# Patient Record
Sex: Male | Born: 1950
Health system: Southern US, Community
[De-identification: ages and names within clinical notes are randomized; demographics above are authoritative.]

## PROBLEM LIST (undated history)

## (undated) DIAGNOSIS — T7840XA Allergy, unspecified, initial encounter: Secondary | ICD-10-CM

## (undated) DIAGNOSIS — G4730 Sleep apnea, unspecified: Secondary | ICD-10-CM

## (undated) DIAGNOSIS — F17201 Nicotine dependence, unspecified, in remission: Secondary | ICD-10-CM

## (undated) DIAGNOSIS — I2510 Atherosclerotic heart disease of native coronary artery without angina pectoris: Secondary | ICD-10-CM

## (undated) DIAGNOSIS — Z973 Presence of spectacles and contact lenses: Secondary | ICD-10-CM

## (undated) DIAGNOSIS — R21 Rash and other nonspecific skin eruption: Secondary | ICD-10-CM

## (undated) DIAGNOSIS — E119 Type 2 diabetes mellitus without complications: Secondary | ICD-10-CM

## (undated) DIAGNOSIS — C61 Malignant neoplasm of prostate: Secondary | ICD-10-CM

## (undated) DIAGNOSIS — J189 Pneumonia, unspecified organism: Secondary | ICD-10-CM

## (undated) DIAGNOSIS — Z794 Long term (current) use of insulin: Secondary | ICD-10-CM

## (undated) DIAGNOSIS — E785 Hyperlipidemia, unspecified: Secondary | ICD-10-CM

## (undated) DIAGNOSIS — M1990 Unspecified osteoarthritis, unspecified site: Secondary | ICD-10-CM

## (undated) DIAGNOSIS — K219 Gastro-esophageal reflux disease without esophagitis: Secondary | ICD-10-CM

## (undated) DIAGNOSIS — I10 Essential (primary) hypertension: Secondary | ICD-10-CM

## (undated) HISTORY — PX: OTHER SURGICAL HISTORY: SHX169

## (undated) HISTORY — DX: Nicotine dependence, unspecified, in remission: F17.201

## (undated) HISTORY — DX: Type 2 diabetes mellitus without complications: E11.9

## (undated) HISTORY — DX: Essential (primary) hypertension: I10

## (undated) HISTORY — DX: Atherosclerotic heart disease of native coronary artery without angina pectoris: I25.10

## (undated) HISTORY — PX: HERNIA REPAIR: SHX51

## (undated) HISTORY — DX: Allergy, unspecified, initial encounter: T78.40XA

## (undated) HISTORY — DX: Long term (current) use of insulin: Z79.4

## (undated) HISTORY — PX: CARDIAC CATHETERIZATION: SHX172

## (undated) HISTORY — DX: Hyperlipidemia, unspecified: E78.5

---

## 1994-04-03 HISTORY — PX: EPIDIDYMIS SURGERY: SHX843

## 2001-04-03 HISTORY — PX: CARDIAC CATHETERIZATION: SHX172

## 2004-03-10 ENCOUNTER — Ambulatory Visit: Payer: Self-pay | Admitting: Family Medicine

## 2004-03-15 ENCOUNTER — Ambulatory Visit: Payer: Self-pay | Admitting: Cardiology

## 2004-03-31 ENCOUNTER — Ambulatory Visit: Payer: Self-pay | Admitting: Family Medicine

## 2004-04-03 HISTORY — PX: PRESSURE ULCER DEBRIDEMENT: SHX750

## 2004-04-18 ENCOUNTER — Ambulatory Visit (HOSPITAL_COMMUNITY): Admission: RE | Admit: 2004-04-18 | Discharge: 2004-04-18 | Payer: Self-pay | Admitting: Podiatry

## 2004-04-27 ENCOUNTER — Ambulatory Visit: Payer: Self-pay | Admitting: Orthopedic Surgery

## 2004-05-03 ENCOUNTER — Encounter (HOSPITAL_COMMUNITY): Admission: RE | Admit: 2004-05-03 | Discharge: 2004-06-02 | Payer: Self-pay | Admitting: Orthopedic Surgery

## 2004-06-30 ENCOUNTER — Ambulatory Visit: Payer: Self-pay | Admitting: Family Medicine

## 2004-08-04 ENCOUNTER — Ambulatory Visit: Payer: Self-pay | Admitting: Family Medicine

## 2004-08-11 ENCOUNTER — Ambulatory Visit (HOSPITAL_COMMUNITY): Admission: RE | Admit: 2004-08-11 | Discharge: 2004-08-11 | Payer: Self-pay | Admitting: Family Medicine

## 2004-08-23 ENCOUNTER — Ambulatory Visit: Payer: Self-pay | Admitting: Internal Medicine

## 2004-08-23 ENCOUNTER — Ambulatory Visit (HOSPITAL_COMMUNITY): Admission: RE | Admit: 2004-08-23 | Discharge: 2004-08-23 | Payer: Self-pay | Admitting: Internal Medicine

## 2004-08-23 LAB — HM COLONOSCOPY

## 2004-11-18 ENCOUNTER — Ambulatory Visit: Payer: Self-pay | Admitting: Family Medicine

## 2005-02-21 ENCOUNTER — Ambulatory Visit: Payer: Self-pay | Admitting: Orthopedic Surgery

## 2005-03-09 ENCOUNTER — Ambulatory Visit: Payer: Self-pay | Admitting: Cardiology

## 2005-03-20 ENCOUNTER — Ambulatory Visit: Payer: Self-pay | Admitting: Family Medicine

## 2005-04-20 ENCOUNTER — Ambulatory Visit: Payer: Self-pay | Admitting: Family Medicine

## 2005-07-20 ENCOUNTER — Ambulatory Visit: Payer: Self-pay | Admitting: Family Medicine

## 2005-10-19 ENCOUNTER — Ambulatory Visit: Payer: Self-pay | Admitting: Family Medicine

## 2006-02-20 ENCOUNTER — Ambulatory Visit: Payer: Self-pay | Admitting: Family Medicine

## 2006-03-08 ENCOUNTER — Ambulatory Visit: Payer: Self-pay | Admitting: Cardiology

## 2006-04-05 ENCOUNTER — Ambulatory Visit: Payer: Self-pay | Admitting: Family Medicine

## 2006-05-17 ENCOUNTER — Ambulatory Visit: Payer: Self-pay | Admitting: Family Medicine

## 2006-05-17 LAB — CONVERTED CEMR LAB
CO2: 23 meq/L (ref 19–32)
Chloride: 109 meq/L (ref 96–112)
Eosinophils Absolute: 0.1 10*3/uL (ref 0.0–0.7)
Glucose, Bld: 105 mg/dL — ABNORMAL HIGH (ref 70–99)
Hgb A1c MFr Bld: 6.7 % — ABNORMAL HIGH (ref 4.6–6.1)
Lymphocytes Relative: 33 % (ref 12–46)
Lymphs Abs: 1.4 10*3/uL (ref 0.7–3.3)
Neutro Abs: 2.3 10*3/uL (ref 1.7–7.7)
Neutrophils Relative %: 54 % (ref 43–77)
Platelets: 227 10*3/uL (ref 150–400)
Potassium: 4.7 meq/L (ref 3.5–5.3)
RBC: 4.57 M/uL (ref 4.22–5.81)
Sodium: 143 meq/L (ref 135–145)
WBC: 4.2 10*3/uL (ref 4.0–10.5)

## 2006-05-18 ENCOUNTER — Ambulatory Visit (HOSPITAL_COMMUNITY): Admission: RE | Admit: 2006-05-18 | Discharge: 2006-05-18 | Payer: Self-pay | Admitting: Family Medicine

## 2006-05-18 ENCOUNTER — Encounter: Payer: Self-pay | Admitting: Family Medicine

## 2006-05-18 LAB — CONVERTED CEMR LAB
Leukocytes, UA: NEGATIVE
Protein, ur: NEGATIVE mg/dL
Specific Gravity, Urine: 1.023 (ref 1.005–1.03)
Urine Glucose: NEGATIVE mg/dL

## 2006-06-28 ENCOUNTER — Ambulatory Visit: Payer: Self-pay | Admitting: Family Medicine

## 2006-09-21 ENCOUNTER — Encounter: Payer: Self-pay | Admitting: Family Medicine

## 2006-09-21 LAB — CONVERTED CEMR LAB
ALT: 13 units/L (ref 0–53)
AST: 17 units/L (ref 0–37)
Bilirubin, Direct: 0.1 mg/dL (ref 0.0–0.3)
Cholesterol: 328 mg/dL — ABNORMAL HIGH (ref 0–200)
Indirect Bilirubin: 0.4 mg/dL (ref 0.0–0.9)
Microalb, Ur: 0.6 mg/dL (ref 0.00–1.89)
Total CHOL/HDL Ratio: 7.5
Triglycerides: 113 mg/dL (ref <150)

## 2006-09-24 ENCOUNTER — Ambulatory Visit: Payer: Self-pay | Admitting: Family Medicine

## 2006-12-21 ENCOUNTER — Encounter: Payer: Self-pay | Admitting: Family Medicine

## 2006-12-21 LAB — CONVERTED CEMR LAB
Bilirubin, Direct: 0.1 mg/dL (ref 0.0–0.3)
Total Bilirubin: 0.5 mg/dL (ref 0.3–1.2)
Total CHOL/HDL Ratio: 2.4
VLDL: 16 mg/dL (ref 0–40)

## 2006-12-25 ENCOUNTER — Ambulatory Visit: Payer: Self-pay | Admitting: Family Medicine

## 2007-01-01 ENCOUNTER — Ambulatory Visit: Payer: Self-pay | Admitting: Cardiology

## 2007-04-26 ENCOUNTER — Encounter: Payer: Self-pay | Admitting: Family Medicine

## 2007-04-26 LAB — CONVERTED CEMR LAB
ALT: 20 units/L (ref 0–53)
Bilirubin, Direct: 0.1 mg/dL (ref 0.0–0.3)
CO2: 23 meq/L (ref 19–32)
Glucose, Bld: 109 mg/dL — ABNORMAL HIGH (ref 70–99)
Potassium: 4.4 meq/L (ref 3.5–5.3)
Sodium: 142 meq/L (ref 135–145)
Total Bilirubin: 0.4 mg/dL (ref 0.3–1.2)
Total CHOL/HDL Ratio: 3.1
VLDL: 25 mg/dL (ref 0–40)

## 2007-04-30 ENCOUNTER — Ambulatory Visit: Payer: Self-pay | Admitting: Family Medicine

## 2007-05-27 ENCOUNTER — Encounter: Payer: Self-pay | Admitting: Family Medicine

## 2007-05-27 LAB — CONVERTED CEMR LAB
Eosinophils Absolute: 0.4 10*3/uL (ref 0.0–0.7)
HCT: 39.6 % (ref 39.0–52.0)
Lymphs Abs: 1.2 10*3/uL (ref 0.7–4.0)
MCV: 94.1 fL (ref 78.0–100.0)
Monocytes Relative: 10 % (ref 3–12)
Neutrophils Relative %: 49 % (ref 43–77)
PSA: 0.51 ng/mL (ref 0.10–4.00)
RBC: 4.21 M/uL — ABNORMAL LOW (ref 4.22–5.81)
WBC: 4.3 10*3/uL (ref 4.0–10.5)

## 2007-08-16 ENCOUNTER — Encounter: Payer: Self-pay | Admitting: Family Medicine

## 2007-08-16 LAB — CONVERTED CEMR LAB
Albumin: 4.3 g/dL (ref 3.5–5.2)
Alkaline Phosphatase: 83 units/L (ref 39–117)
CO2: 25 meq/L (ref 19–32)
Chloride: 106 meq/L (ref 96–112)
Creatinine, Ser: 1.26 mg/dL (ref 0.40–1.50)
HDL: 47 mg/dL (ref >39)
LDL Cholesterol: 76 mg/dL (ref 0–99)
Potassium: 4.5 meq/L (ref 3.5–5.3)
Total Bilirubin: 0.3 mg/dL (ref 0.3–1.2)
Total CHOL/HDL Ratio: 3.1
VLDL: 23 mg/dL (ref 0–40)

## 2007-08-20 ENCOUNTER — Ambulatory Visit: Payer: Self-pay | Admitting: Family Medicine

## 2007-08-20 ENCOUNTER — Encounter: Payer: Self-pay | Admitting: Family Medicine

## 2007-09-09 ENCOUNTER — Ambulatory Visit: Payer: Self-pay | Admitting: Cardiology

## 2007-09-16 ENCOUNTER — Encounter: Payer: Self-pay | Admitting: Cardiology

## 2007-09-16 ENCOUNTER — Ambulatory Visit (HOSPITAL_COMMUNITY): Admission: RE | Admit: 2007-09-16 | Discharge: 2007-09-16 | Payer: Self-pay | Admitting: Cardiology

## 2007-09-16 ENCOUNTER — Ambulatory Visit: Payer: Self-pay | Admitting: Cardiology

## 2007-10-30 ENCOUNTER — Telehealth: Payer: Self-pay | Admitting: Family Medicine

## 2007-12-26 ENCOUNTER — Encounter: Payer: Self-pay | Admitting: Family Medicine

## 2008-01-07 ENCOUNTER — Encounter: Payer: Self-pay | Admitting: Family Medicine

## 2008-01-20 ENCOUNTER — Encounter: Payer: Self-pay | Admitting: Family Medicine

## 2008-01-20 LAB — CONVERTED CEMR LAB
ALT: 14 units/L (ref 0–53)
AST: 18 units/L (ref 0–37)
Albumin: 4.5 g/dL (ref 3.5–5.2)
Alkaline Phosphatase: 96 units/L (ref 39–117)
Calcium: 9.1 mg/dL (ref 8.4–10.5)
Cholesterol: 110 mg/dL (ref 0–200)
Creatinine, Ser: 1.28 mg/dL (ref 0.40–1.50)
Creatinine, Urine: 289.9 mg/dL
HDL: 43 mg/dL (ref >39)
Microalb Creat Ratio: 6.8 mg/g (ref 0.0–30.0)
Microalb, Ur: 1.96 mg/dL — ABNORMAL HIGH (ref 0.00–1.89)
Sodium: 142 meq/L (ref 135–145)
Total CHOL/HDL Ratio: 2.6
Triglycerides: 99 mg/dL (ref <150)

## 2008-01-22 ENCOUNTER — Ambulatory Visit: Payer: Self-pay | Admitting: Family Medicine

## 2008-01-22 LAB — CONVERTED CEMR LAB: Blood Glucose, Fasting: 146 mg/dL

## 2008-04-03 HISTORY — PX: COLONOSCOPY: SHX174

## 2008-05-05 ENCOUNTER — Encounter: Payer: Self-pay | Admitting: Family Medicine

## 2008-05-05 LAB — CONVERTED CEMR LAB
Alkaline Phosphatase: 89 units/L (ref 39–117)
BUN: 15 mg/dL (ref 6–23)
Bilirubin, Direct: 0.1 mg/dL (ref 0.0–0.3)
Chloride: 104 meq/L (ref 96–112)
Cholesterol: 148 mg/dL (ref 0–200)
Creatinine, Ser: 1.38 mg/dL (ref 0.40–1.50)
Glucose, Bld: 110 mg/dL — ABNORMAL HIGH (ref 70–99)
Indirect Bilirubin: 0.3 mg/dL (ref 0.0–0.9)
LDL Cholesterol: 75 mg/dL (ref 0–99)
Total Protein: 7.2 g/dL (ref 6.0–8.3)
Triglycerides: 160 mg/dL — ABNORMAL HIGH (ref <150)
VLDL: 32 mg/dL (ref 0–40)

## 2008-05-07 ENCOUNTER — Ambulatory Visit: Payer: Self-pay | Admitting: Family Medicine

## 2008-05-07 DIAGNOSIS — M25569 Pain in unspecified knee: Secondary | ICD-10-CM | POA: Insufficient documentation

## 2008-05-07 LAB — CONVERTED CEMR LAB: Blood Glucose, Fasting: 117 mg/dL

## 2008-08-18 ENCOUNTER — Encounter: Payer: Self-pay | Admitting: Family Medicine

## 2008-08-18 LAB — CONVERTED CEMR LAB
ALT: 17 units/L (ref 0–53)
AST: 17 units/L (ref 0–37)
Albumin: 4 g/dL (ref 3.5–5.2)
BUN: 11 mg/dL (ref 6–23)
CO2: 23 meq/L (ref 19–32)
Chloride: 106 meq/L (ref 96–112)
Creatinine, Ser: 1.13 mg/dL (ref 0.40–1.50)
Eosinophils Absolute: 0.2 10*3/uL (ref 0.0–0.7)
Eosinophils Relative: 4 % (ref 0–5)
Glucose, Bld: 104 mg/dL — ABNORMAL HIGH (ref 70–99)
HCT: 38.1 % — ABNORMAL LOW (ref 39.0–52.0)
Hemoglobin: 12.4 g/dL — ABNORMAL LOW (ref 13.0–17.0)
Lymphocytes Relative: 34 % (ref 12–46)
Lymphs Abs: 1.8 10*3/uL (ref 0.7–4.0)
MCV: 90.5 fL (ref 78.0–100.0)
Monocytes Absolute: 0.5 10*3/uL (ref 0.1–1.0)
Monocytes Relative: 10 % (ref 3–12)
PSA: 0.79 ng/mL (ref 0.10–4.00)
Platelets: 201 10*3/uL (ref 150–400)
Potassium: 4.4 meq/L (ref 3.5–5.3)
Triglycerides: 168 mg/dL — ABNORMAL HIGH (ref <150)
VLDL: 34 mg/dL (ref 0–40)
WBC: 5.2 10*3/uL (ref 4.0–10.5)

## 2008-08-19 ENCOUNTER — Ambulatory Visit: Payer: Self-pay | Admitting: Family Medicine

## 2008-08-25 ENCOUNTER — Encounter: Payer: Self-pay | Admitting: Family Medicine

## 2008-09-23 ENCOUNTER — Encounter: Payer: Self-pay | Admitting: Cardiology

## 2008-09-23 ENCOUNTER — Ambulatory Visit: Payer: Self-pay | Admitting: Cardiology

## 2008-09-23 DIAGNOSIS — Z794 Long term (current) use of insulin: Secondary | ICD-10-CM

## 2008-09-23 DIAGNOSIS — E1165 Type 2 diabetes mellitus with hyperglycemia: Secondary | ICD-10-CM

## 2008-11-01 HISTORY — PX: KNEE ARTHROSCOPY W/ MENISCECTOMY: SHX1879

## 2008-11-09 ENCOUNTER — Encounter: Payer: Self-pay | Admitting: Family Medicine

## 2008-12-22 LAB — CONVERTED CEMR LAB: TSH: 1.58 microintl units/mL (ref 0.350–4.500)

## 2008-12-23 ENCOUNTER — Ambulatory Visit: Payer: Self-pay | Admitting: Family Medicine

## 2008-12-23 LAB — CONVERTED CEMR LAB: Hgb A1c MFr Bld: 7.3 %

## 2009-01-07 ENCOUNTER — Telehealth: Payer: Self-pay | Admitting: Family Medicine

## 2009-01-19 ENCOUNTER — Encounter: Payer: Self-pay | Admitting: Family Medicine

## 2009-01-29 ENCOUNTER — Encounter: Payer: Self-pay | Admitting: Family Medicine

## 2009-02-09 ENCOUNTER — Telehealth: Payer: Self-pay | Admitting: Family Medicine

## 2009-03-08 LAB — CONVERTED CEMR LAB
ALT: 14 units/L (ref 0–53)
AST: 11 units/L (ref 0–37)
Albumin: 4.5 g/dL (ref 3.5–5.2)
CO2: 24 meq/L (ref 19–32)
Calcium: 8.7 mg/dL (ref 8.4–10.5)
Chloride: 105 meq/L (ref 96–112)
HDL: 39 mg/dL — ABNORMAL LOW (ref >39)
Sodium: 140 meq/L (ref 135–145)
Total Bilirubin: 0.4 mg/dL (ref 0.3–1.2)
Total CHOL/HDL Ratio: 3.2
Total Protein: 7.1 g/dL (ref 6.0–8.3)
Triglycerides: 183 mg/dL — ABNORMAL HIGH (ref <150)

## 2009-03-09 ENCOUNTER — Ambulatory Visit: Payer: Self-pay | Admitting: Family Medicine

## 2009-03-09 LAB — CONVERTED CEMR LAB: Glucose, Bld: 123 mg/dL

## 2009-03-10 ENCOUNTER — Encounter: Payer: Self-pay | Admitting: Family Medicine

## 2009-03-10 LAB — CONVERTED CEMR LAB: Microalb Creat Ratio: 5.6 mg/g (ref 0.0–30.0)

## 2009-06-15 ENCOUNTER — Encounter (INDEPENDENT_AMBULATORY_CARE_PROVIDER_SITE_OTHER): Payer: Self-pay | Admitting: *Deleted

## 2009-06-15 ENCOUNTER — Ambulatory Visit: Payer: Self-pay | Admitting: Family Medicine

## 2009-06-15 DIAGNOSIS — R5383 Other fatigue: Secondary | ICD-10-CM

## 2009-06-15 DIAGNOSIS — R5381 Other malaise: Secondary | ICD-10-CM

## 2009-06-15 DIAGNOSIS — M25519 Pain in unspecified shoulder: Secondary | ICD-10-CM | POA: Insufficient documentation

## 2009-06-15 DIAGNOSIS — D649 Anemia, unspecified: Secondary | ICD-10-CM | POA: Insufficient documentation

## 2009-06-15 LAB — CONVERTED CEMR LAB
ALT: 15 units/L
AST: 16 units/L
Bilirubin, Direct: 0.1 mg/dL
Cholesterol: 116 mg/dL
Creatinine, Ser: 1.23 mg/dL
GFR calc non Af Amer: 9 mL/min
Hgb A1c MFr Bld: 7.5 %
LDL Cholesterol: 51 mg/dL
Sodium: 142 meq/L
Triglycerides: 144 mg/dL

## 2009-06-16 LAB — CONVERTED CEMR LAB
Alkaline Phosphatase: 99 units/L (ref 39–117)
BUN: 14 mg/dL (ref 6–23)
Bilirubin, Direct: 0.1 mg/dL (ref 0.0–0.3)
CO2: 24 meq/L (ref 19–32)
Chloride: 107 meq/L (ref 96–112)
Creatinine, Ser: 1.23 mg/dL (ref 0.40–1.50)
Glucose, Bld: 119 mg/dL — ABNORMAL HIGH (ref 70–99)
Indirect Bilirubin: 0.4 mg/dL (ref 0.0–0.9)
LDL Cholesterol: 51 mg/dL (ref 0–99)
Total Bilirubin: 0.5 mg/dL (ref 0.3–1.2)
VLDL: 29 mg/dL (ref 0–40)

## 2009-07-02 HISTORY — PX: ROTATOR CUFF REPAIR: SHX139

## 2009-07-09 ENCOUNTER — Encounter (INDEPENDENT_AMBULATORY_CARE_PROVIDER_SITE_OTHER): Payer: Self-pay | Admitting: *Deleted

## 2009-08-03 ENCOUNTER — Telehealth: Payer: Self-pay | Admitting: Family Medicine

## 2009-09-24 ENCOUNTER — Encounter (INDEPENDENT_AMBULATORY_CARE_PROVIDER_SITE_OTHER): Payer: Self-pay | Admitting: *Deleted

## 2009-09-27 ENCOUNTER — Ambulatory Visit: Payer: Self-pay | Admitting: Cardiology

## 2009-09-27 DIAGNOSIS — I2510 Atherosclerotic heart disease of native coronary artery without angina pectoris: Secondary | ICD-10-CM

## 2009-09-27 DIAGNOSIS — Z87891 Personal history of nicotine dependence: Secondary | ICD-10-CM

## 2009-10-11 ENCOUNTER — Encounter (INDEPENDENT_AMBULATORY_CARE_PROVIDER_SITE_OTHER): Payer: Self-pay | Admitting: *Deleted

## 2009-10-11 LAB — CONVERTED CEMR LAB
Basophils Absolute: 0 10*3/uL
Basophils Relative: 1 %
Eosinophils Absolute: 0.1 10*3/uL
HCT: 40 %
Hemoglobin: 12.6 g/dL
MCV: 88.3 fL
Monocytes Absolute: 0.5 10*3/uL
Monocytes Relative: 9 %
Platelets: 258 10*3/uL
RDW: 16.2 %
WBC: 5.2 10*3/uL

## 2009-10-12 ENCOUNTER — Ambulatory Visit: Payer: Self-pay | Admitting: Family Medicine

## 2009-10-12 ENCOUNTER — Telehealth: Payer: Self-pay | Admitting: Cardiology

## 2009-10-12 LAB — CONVERTED CEMR LAB: Retic Ct Pct: 1.6 % (ref 0.4–3.1)

## 2009-11-26 ENCOUNTER — Encounter (INDEPENDENT_AMBULATORY_CARE_PROVIDER_SITE_OTHER): Payer: Self-pay | Admitting: *Deleted

## 2009-11-26 ENCOUNTER — Telehealth (INDEPENDENT_AMBULATORY_CARE_PROVIDER_SITE_OTHER): Payer: Self-pay | Admitting: *Deleted

## 2009-11-29 ENCOUNTER — Telehealth: Payer: Self-pay | Admitting: Family Medicine

## 2009-12-03 ENCOUNTER — Ambulatory Visit: Payer: Self-pay | Admitting: Cardiology

## 2009-12-03 ENCOUNTER — Encounter (HOSPITAL_COMMUNITY)
Admission: RE | Admit: 2009-12-03 | Discharge: 2009-12-03 | Payer: Self-pay | Source: Home / Self Care | Admitting: Cardiology

## 2009-12-16 ENCOUNTER — Encounter (INDEPENDENT_AMBULATORY_CARE_PROVIDER_SITE_OTHER): Payer: Self-pay | Admitting: *Deleted

## 2009-12-20 ENCOUNTER — Ambulatory Visit: Payer: Self-pay | Admitting: Cardiology

## 2009-12-20 ENCOUNTER — Encounter (INDEPENDENT_AMBULATORY_CARE_PROVIDER_SITE_OTHER): Payer: Self-pay | Admitting: *Deleted

## 2009-12-24 ENCOUNTER — Encounter (INDEPENDENT_AMBULATORY_CARE_PROVIDER_SITE_OTHER): Payer: Self-pay | Admitting: *Deleted

## 2010-01-12 LAB — CONVERTED CEMR LAB
ALT: 12 units/L (ref 0–53)
AST: 15 units/L (ref 0–37)
Alkaline Phosphatase: 112 units/L (ref 39–117)
BUN: 16 mg/dL (ref 6–23)
Bilirubin, Direct: 0.1 mg/dL (ref 0.0–0.3)
Calcium: 8.8 mg/dL (ref 8.4–10.5)
Cholesterol: 110 mg/dL (ref 0–200)
Glucose, Bld: 87 mg/dL (ref 70–99)
Indirect Bilirubin: 0.2 mg/dL (ref 0.0–0.9)
Potassium: 4.1 meq/L (ref 3.5–5.3)
Total Protein: 7.3 g/dL (ref 6.0–8.3)
Triglycerides: 183 mg/dL — ABNORMAL HIGH (ref <150)

## 2010-01-13 ENCOUNTER — Ambulatory Visit: Payer: Self-pay | Admitting: Family Medicine

## 2010-01-13 LAB — HM DIABETES FOOT EXAM

## 2010-01-14 ENCOUNTER — Encounter: Payer: Self-pay | Admitting: Family Medicine

## 2010-01-21 ENCOUNTER — Encounter: Payer: Self-pay | Admitting: Family Medicine

## 2010-01-27 ENCOUNTER — Telehealth (INDEPENDENT_AMBULATORY_CARE_PROVIDER_SITE_OTHER): Payer: Self-pay | Admitting: *Deleted

## 2010-02-15 ENCOUNTER — Telehealth: Payer: Self-pay | Admitting: Family Medicine

## 2010-03-11 ENCOUNTER — Telehealth: Payer: Self-pay | Admitting: Family Medicine

## 2010-03-14 LAB — CONVERTED CEMR LAB
BUN: 15 mg/dL (ref 6–23)
CO2: 27 meq/L (ref 19–32)
Calcium: 9.2 mg/dL (ref 8.4–10.5)
Cholesterol: 151 mg/dL (ref 0–200)
Creatinine, Ser: 1.31 mg/dL (ref 0.40–1.50)
Glucose, Bld: 136 mg/dL — ABNORMAL HIGH (ref 70–99)
Sodium: 139 meq/L (ref 135–145)
Total CHOL/HDL Ratio: 3.7

## 2010-03-15 ENCOUNTER — Ambulatory Visit: Payer: Self-pay | Admitting: Family Medicine

## 2010-03-24 ENCOUNTER — Telehealth: Payer: Self-pay | Admitting: Family Medicine

## 2010-05-03 NOTE — Letter (Signed)
Summary: [facility] at [facility]  [address], [place] [address]   Phone: [phone]  Fax: [phone]     December 20, 2009 MRN: [id number]   Robert [name] [address] [place], [place]  [address]      YOUR LAB WORK IS DUE   March 21, 2010  Please go to [facility], located across the street from [facility] on the second floor.  Hours are Monday - Friday 7am until 7:30pm         Saturday 8am until 12noon    _X_  DO NOT EAT OR DRINK AFTER MIDNIGHT EVENING PRIOR TO LABWORK  __ YOUR LABWORK IS NOT FASTING --YOU MAY EAT PRIOR TO LABWORK

## 2010-05-03 NOTE — Assessment & Plan Note (Signed)
Summary: office visit   Vital Signs:  Patient profile:   60 year old male Height:      69 inches Weight:      220.75 pounds BMI:     32.72 O2 Sat:      96 % Pulse rate:   76 / minute Pulse rhythm:   regular Resp:     16 per minute BP sitting:   112 / 80  (left arm) Cuff size:   large  Vitals Entered By: Everitt Amber LPN (January 13, 2010 9:42 AM)  Nutrition Counseling: Patient's BMI is greater than 25 and therefore counseled on weight management options. CC: Follow up chronic problems   Primary Care Farhaan [facility]:  Dr. Syliva Overman  CC:  Follow up chronic problems.  History of Present Illness: Reports  that he has been doing fairly well. He unfortunatelyu stopped checking his sugars when he relised that they were higher than they should be, he did cut back sweetened drinks, but he has not stuck to a regular exercise routine.As a result his blood sugar control has deteriorated. I spent a long time discussing and explaining the nat h/o the ds and the impt of taking control and owbership of the disease.  Denies recent fever or chills. Denies sinus pressure, nasal congestion , ear pain or sore throat. Denies chest congestion, or cough productive of sputum. Denies chest pain, palpitations, PND, orthopnea or leg swelling. Denies abdominal pain, nausea, vomitting, diarrhea or constipation. Denies change in bowel movements or bloody stool. Denies dysuria , frequency, incontinence or hesitancy. Denies  joint pain, swelling, or reduced mobility. Denies headaches, vertigo, seizures. Denies depression, anxiety or insomnia. Denies  rash, lesions, or itch.     Allergies: 1)  ! * Crestor  Review of Systems      See HPI Eyes:  Denies discharge, eye pain, and red eye. Endo:  Denies excessive thirst and excessive urination; not testing . Heme:  Denies abnormal bruising and bleeding. Allergy:  Denies hives or rash and itching eyes.  Physical Exam  General:   Well-developed,well-nourished,in no acute distress; alert,appropriate and cooperative throughout examination HEENT: No facial asymmetry,  EOMI, No sinus tenderness, TM's Clear, oropharynx  pink and moist.   Chest: Clear to auscultation bilaterally.  CVS: S1, S2, No murmurs, No S3.   Abd: Soft, Nontender.  MS: Adequate ROM spine, hips, shoulders and knees.  Ext: No edema.   CNS: CN 2-12 intact, power tone and sensation normal throughout.   Skin: Intact, no visible lesions or rashes.  Psych: Good eye contact, normal affect.  Memory intact, not anxious or depressed appearing.    Diabetes Management Exam:    Foot Exam (with socks and/or shoes not present):       Sensory-Monofilament:          Left foot: diminished          Right foot: diminished       Inspection:          Left foot: normal          Right foot: normal       Nails:          Left foot: normal          Right foot: normal   Impression & Recommendations:  Problem # 1:  HYPERTENSION (ICD-401.9) Assessment Unchanged  His updated medication list for this problem includes:    Benicar 20 Mg Tabs (Olmesartan medoxomil) .Marland Kitchen... Take 1 tablet by mouth once a day  Toprol Xl 50 Mg Xr24h-tab (Metoprolol succinate) .Marland Kitchen... Take 1 tablet by mouth once a day  Orders: T-Basic Metabolic Panel [phone])  BP today: 112/80 Prior BP: 117/76 (12/20/2009)  Prior 10 Yr Risk Heart Disease: 9 % (12/03/2009)  Labs Reviewed: K+: 4.1 (01/11/2010) Creat: : 1.60 (01/11/2010)   Chol: 110 (01/11/2010)   HDL: 36 (01/11/2010)   LDL: 37 (01/11/2010)   TG: 183 (01/11/2010)  Problem # 2:  HYPERLIPIDEMIA (ICD-272.4) Assessment: Unchanged  His updated medication list for this problem includes:    Simvastatin 40 Mg Tabs (Simvastatin) .Marland Kitchen... Take one tablet by mouth daily at bedtime Low fat dietdiscussed and encouraged  Labs Reviewed: SGOT: 15 (01/11/2010)   SGPT: 12 (01/11/2010)  Prior 10 Yr Risk Heart Disease: 9 % (12/03/2009)   HDL:36  (01/11/2010), 36 (57/84/6962)  LDL:37 (01/11/2010), 51 (06/15/2009)  Chol:110 (01/11/2010), 116 (06/15/2009)  Trig:183 (01/11/2010), 144 (06/15/2009)  Problem # 3:  DIABETES MELLITUS, TYPE II, CONTROLLED, MILD (ICD-250.00) Assessment: Deteriorated  The following medications were removed from the medication list:    Glipizide 5 Mg Tabs (Glipizide) ..... One and a half in the morning and one in the evening His updated medication list for this problem includes:    Aspirin 81 Mg Tbec (Aspirin) .Marland Kitchen... Take 1 tablet by mouth once a day    Benicar 20 Mg Tabs (Olmesartan medoxomil) .Marland Kitchen... Take 1 tablet by mouth once a day    Onglyza 5 Mg Tabs (Saxagliptin hcl) .Marland Kitchen... Take 1 tablet by mouth once a day    Glipizide 5 Mg Tabs (Glipizide) .Marland Kitchen..Marland Kitchen Two tablets twice daily Patient advised to reduce carbs and sweets, commit to regular physical activity, take meds as prescribed, test blood sugars as directed, and attempt to lose weight , to improve blood sugar control.  Labs Reviewed: Creat: 1.60 (01/11/2010)    Reviewed HgBA1c results: 7.7 (01/11/2010)  7.4 (10/11/2009)  Complete Medication List: 1)  Aspirin 81 Mg Tbec (Aspirin) .... Take 1 tablet by mouth once a day 2)  Benicar 20 Mg Tabs (Olmesartan medoxomil) .... Take 1 tablet by mouth once a day 3)  Onglyza 5 Mg Tabs (Saxagliptin hcl) .... Take 1 tablet by mouth once a day 4)  Toprol Xl 50 Mg Xr24h-tab (Metoprolol succinate) .... Take 1 tablet by mouth once a day 5)  Simvastatin 40 Mg Tabs (Simvastatin) .... Take one tablet by mouth daily at bedtime 6)  Glipizide 5 Mg Tabs (Glipizide) .... Two tablets twice daily  Other Orders: Influenza Vaccine MCR (95284) Pneumococcal Vaccine (13244) Admin 1st Vaccine (01027) Admin 1st Vaccine [facility]) [phone])  Patient Instructions: 1)  Please schedule a follow-up appointment in 2 months. 2)  BMP prior to visit, ICD-9:  non fasting in 2 months 3)  PLS inc the glipizide to 5mg  TWO twice daily 4)  PLS check your  sugar at least twice daily and record, and vbring your meter and log to the next visit. 5)  You CASn take control and WIN the battle 6)  Today you will get the flu vac and pneumonia vaccines    Pneumovax    Vaccine Type: Pneumovax    Site: left deltoid    Mfr: Merck    Dose: 0.5 ml    Route: IM    Given by: Everitt Amber LPN    Exp. Date: 06/20/2011    Lot #: [id number]  Influenza Vaccine    Vaccine Type: Fluvax MCR    Site: right deltoid    Mfr: novartis    Dose: 0.25  ml    Route: IM    Given by: Everitt Amber LPN    Exp. Date: 08/2010    Lot #: [id number]

## 2010-05-03 NOTE — Miscellaneous (Signed)
Summary: LABS ANEMIA PROFILE,CBCD,A1C,PSA,10/11/2009  Clinical Lists Changes  Observations: Added new observation of FOLATE: 16.1 ng/mL (10/11/2009 11:44) Added new observation of B12: 235 pg/mL (10/11/2009 11:44) Added new observation of IRON SATUR %: 17 % (10/11/2009 11:44) Added new observation of TIBC: 350 mcg/dL (16/01/9603 54:09) Added new observation of UIBC: 292 mcg/dL (81/19/1478 29:56) Added new observation of IRON: 58 mcg/dL (21/30/8657 84:69) Added new observation of RETICULOCTAB: 1.6  (10/11/2009 11:44) Added new observation of RBC _: 4.53 10^6/microliter (10/11/2009 11:44) Added new observation of ABSOLUTE BAS: 0.0 K/uL (10/11/2009 11:44) Added new observation of BASOPHIL %: 1 % (10/11/2009 11:44) Added new observation of EOS ABSLT: 0.1 K/uL (10/11/2009 11:44) Added new observation of % EOS AUTO: 3 % (10/11/2009 11:44) Added new observation of ABSOLUTE MON: 0.5 K/uL (10/11/2009 11:44) Added new observation of MONOCYTE %: 9 % (10/11/2009 11:44) Added new observation of ABS LYMPHOCY: 1.7 K/uL (10/11/2009 11:44) Added new observation of LYMPHS %: 32 % (10/11/2009 11:44) Added new observation of PLATELETK/UL: 258 K/uL (10/11/2009 11:44) Added new observation of RDW: 16.2 % (10/11/2009 11:44) Added new observation of MCHC RBC: 31.5 g/dL (62/95/2841 32:44) Added new observation of MCV: 88.3 fL (10/11/2009 11:44) Added new observation of HCT: 40.0 % (10/11/2009 11:44) Added new observation of HGB: 12.6 g/dL (04/05/7251 66:44) Added new observation of RBC M/UL: 4.53 M/uL (10/11/2009 11:44) Added new observation of WBC COUNT: 5.2 10*3/microliter (10/11/2009 11:44)

## 2010-05-03 NOTE — Letter (Signed)
Summary: [facility] (Nuc Med Stress)   [facility]  [address] [place] [address]   Phone: [phone]  Fax: [phone]    Nuclear Medicine 1-Day Stress Test Information Sheet  Re:     Robert [name]   DOB:     08/14/1950 MRN:     [id number] Weight:  Appointment Date: Register at: Appointment Time: Referring MD:  _X__Exercise Stress  __Adenosine   __Dobutamine  __Lexiscan  __Persantine   __Thallium  Urgency: ____1 (next day)   ____2 (one week)    ____3 (PRN)  Patient will receive Follow Up call with results: Patient needs follow-up appointment:  Instructions regarding medication:  How to prepare for your stress test: 1. DO NOT eat or dring 6 hours prior to your arrival time. This includes no caffeine (coffee, tea, sodas, chocolate) if you were instructed to take your medications, drink water with it.(HOLD METORPOLOL AND GLIPIZIDE THE MORNING OF YOUR TEST) 2. DO NOT use any tobacco products for at leaset 8 hours prior to arrival. 3. DO NOT wear dresses or any clothing that may have metal clasps or buttons. 4. Wear short sleeve shirts, loose clothing, and comfortalbe walking shoes. 5. DO NOT use lotions, oils or powder on your chest before the test. 6. The test will take approximately 3-4 hours from the time you arrive until completion. 7. To register the day of the test, go to the Short Stay entrance at [facility]. 8. If you must cancel your test, call [phone] as soon as you are aware.  After you arrive for test:   When you arrive at [facility], you will go to Short Stay to be registered. They will then send you to Radiology to check in. The Nuclear Medicine Tech will get you and start an IV in your arm or hand. A small amount of a radioactive tracer will then be injected into your IV. This tracer will then have to circulate for 30-45 minutes. During this time you will wait in the waiting room and you will be able to drink  something without caffeine. A series of pictures will be taken of your heart follwoing this waiting period. After the 1st set of pictures you will go to the stress lab to get ready for your stress test. During the stress test, another small amount of a radioactive tracer will be injected through your IV. When the stress test is complete, there is a short rest period while your heart rate and blood pressure will be monitored. When this monitoring period is complete you will have another set of pictrues taken. (The same as the 1st set of pictures). These pictures are taken between 15 minutes and 1 hour after the stress test. The time depends on the type of stress test you had. Your doctor will inform you of your test results within 7 days after test.    The possibilities of certain changes are possible during the test. They include abnormal blood pressure and disorders of the heart. Side effects of persantine or adenosine can include flushing, chest pain, shortness of breath, stomach tightness, headache and light-headedness. These side effects usually do not last long and are self-resolving. Every effort will be made to keep you comfortable and to minimize complications by obtaining a medical history and by close observation during the test. Emergency equipment, medications, and trained personnel are available to deal with any unusual situation which may arise.  Please notify office at least 48 hours  in advance if you are unable to keep this appt.

## 2010-05-03 NOTE — Progress Notes (Signed)
Summary: stips  Phone Note Call from Patient   Summary of Call: patient needs his freestyle stips called into [facility] in [place] he said he called in earlier to get these sent in, he is at the drug store  now was just going to buy them w/o prescription a little higher then he thought.  if we need to call him please call him at , [phone] Initial call taken by: Curtis Sites,  February 15, 2010 3:44 PM  Follow-up for Phone Call        pls order strips for twice daily testing for the meter #60 [facility]  dx uncontrolled type 2 diabetes, let pt know when done also pls Follow-up by: Syliva Overman MD,  February 16, 2010 6:29 AM    New/Updated Medications: * FREESTYLE TEST  STRP (GLUCOSE BLOOD) Use as directed Prescriptions: FREESTYLE TEST  STRP (GLUCOSE BLOOD) Use as directed  #60 x 5   Entered by:   Mauricia Area CMA   Authorized by:   Syliva Overman MD   Signed by:   Mauricia Area CMA on 02/16/2010   Method used:   Printed then faxed to ...       [facility] Environmental education officer)       [address] [address]       [place], [place]  [address]       Ph: [phone]       Fax: [phone]   RxID:   [id number]

## 2010-05-03 NOTE — Progress Notes (Signed)
Summary: MEDICINE  Phone Note Call from Patient   Summary of Call:  SEND RX  TO [facility]  TOPROL    ONGOLYZA,   GLIPIZID THOUGHT IT HAD BEEN DONE LAST WEEK BUT HAVE NOT BEEN SENT IN YET Initial call taken by: Lind Guest,  November 29, 2009 11:14 AM    Prescriptions: GLIPIZIDE 5 MG TABS (GLIPIZIDE) one and a half in the morning and one in the evening  #225 x 0   Entered by:   Adella Hare LPN   Authorized by:   Syliva Overman MD   Signed by:   Adella Hare LPN on 93/79/0240   Method used:   Faxed to ...       [facility] Environmental education officer)       [address] [address]       [place], [place]  [address]       Ph: [phone]       Fax: [phone]   RxID:   [id number] TOPROL XL 50 MG XR24H-TAB (METOPROLOL SUCCINATE) Take 1 tablet by mouth once a day  #90 x 0   Entered by:   Adella Hare LPN   Authorized by:   Syliva Overman MD   Signed by:   Adella Hare LPN on 85/63/1497   Method used:   Faxed to ...       [facility] Environmental education officer)       [address] [address]       [place], [place]  [address]       Ph: [phone]       Fax: [phone]   RxID:   [id number] ONGLYZA 5 MG TABS (SAXAGLIPTIN HCL) Take 1 tablet by mouth once a day  #90 x 0   Entered by:   Adella Hare LPN   Authorized by:   Syliva Overman MD   Signed by:   Adella Hare LPN on 65/46/5035   Method used:   Faxed to ...       [facility] Environmental education officer)       [address] [address]       [place], [place]  [address]       Ph: [phone]       Fax: [phone]   RxID:   [id number]

## 2010-05-03 NOTE — Assessment & Plan Note (Signed)
Summary: physical   Vital Signs:  Patient profile:   60 year old male Height:      69 inches Weight:      223.75 pounds BMI:     33.16 O2 Sat:      96 % Pulse rate:   78 / minute Pulse rhythm:   regular Resp:     16 per minute BP sitting:   120 / 82  (right arm)  Vitals Entered By: Everitt Amber LPN (October 12, 2009 8:46 AM)  Nutrition Counseling: Patient's BMI is greater than 25 and therefore counseled on weight management options. CC: CPE   Primary Care Provider:  Dr. Syliva Overman  CC:  CPE.  History of Present Illness: Reports  that he has been doing fairly well. Since his last appt he has had right shoulder surgery aND IS DOING VERY  WELL IN THERAPY. hE IS STILL UNABLE TO GOLF , AND IS ANXIOUS TO RETURN TO THIS. Denies recent fever or chills. Denies sinus pressure, nasal congestion , ear pain or sore throat. Denies chest congestion, or cough productive of sputum.  Denies abdominal pain, nausea, vomitting, diarrhea or constipation. Denies change in bowel movements or bloody stool. Denies dysuria , frequency, incontinence or hesitancy. Denies  joint pain, swelling, or reduced mobility. Denies headaches, vertigo, seizures. Denies depression, anxiety or insomnia. Denies  rash, lesions, or itch.     Current Medications (verified): 1)  Vytorin 10-40 Mg  Tabs (Ezetimibe-Simvastatin) .... Take One Tablet By Mouth Every Mon. Wed. Fri. and Sun. 2)  Aspirin 81 Mg  Tbec (Aspirin) .... Take 1 Tablet By Mouth Once A Day 3)  Benicar 20 Mg  Tabs (Olmesartan Medoxomil) .... Take 1 Tablet By Mouth Once A Day 4)  Onglyza 5 Mg Tabs (Saxagliptin Hcl) .... Take 1 Tablet By Mouth Once A Day 5)  Glipizide 5 Mg Tabs (Glipizide) .... Take 1 Tablet By Mouth Two Times A Day 6)  Toprol Xl 50 Mg Xr24h-Tab (Metoprolol Succinate) .... Take 1 Tablet By Mouth Once A Day  Allergies (verified): 1)  ! * Crestor  Past History:  Past Surgical History: CAD S/P PTCA placement 2003 Rt foot surgery  ulcer 2006 Epididymitis S/P removal 1996 Mid RCA unsuccesful attempt at stenting but a lot of colateral flow 2003  Right knee surgery 11/2008  murphy right shoulder surgery July 16, 2009  Review of Systems      See HPI General:  Complains of fatigue and malaise; denies chills and fever; exertinal fatigue x 1 yr worsening. Eyes:  Complains of vision loss-both eyes; denies discharge, eye pain, and red eye; last test 2 years ago, pt to [facility] own appt, ins not covering. CV:  Complains of fatigue; increasing fatigue in the past 1 year, worse with activity, no stress test since 2005 reportedly, 3 stenets placedin 2 vessels in 2003, after 30 mins he has to rest 30 mins. MS:  Complains of joint pain and stiffness; improved shoulder pain and mobility following surgery in 07/2009. Endo:  Denies excessive thirst and excessive urination; not testing regularly, fasyings are oveer 130, understands the need to test daily. Heme:  Denies abnormal bruising and bleeding. Allergy:  Denies hives or rash and itching eyes.  Physical Exam  General:  Well-developed,well-nourished,in no acute distress; alert,appropriate and cooperative throughout examination Head:  Normocephalic and atraumatic without obvious abnormalities. No apparent alopecia or balding. Eyes:  No corneal or conjunctival inflammation noted. EOMI. Perrla. Funduscopic exam benign, without hemorrhages, exudates or papilledema. Vision grossly  normal. Ears:  External ear exam shows no significant lesions or deformities.  Otoscopic examination reveals clear canals, tympanic membranes are intact bilaterally without bulging, retraction, inflammation or discharge. Hearing is grossly normal bilaterally. Nose:  External nasal examination shows no deformity or inflammation. Nasal mucosa are pink and moist without lesions or exudates. Mouth:  Oral mucosa and oropharynx without lesions or exudates.  Teeth in good repair. Neck:  No deformities, masses, or  tenderness noted. Chest Wall:  No deformities, masses, tenderness or gynecomastia noted. Breasts:  No masses or gynecomastia noted Lungs:  Normal respiratory effort, chest expands symmetrically. Lungs are clear to auscultation, no crackles or wheezes. Heart:  Normal rate and regular rhythm. S1 and S2 normal without gallop, murmur, click, rub or other extra sounds. Abdomen:  Bowel sounds positive,abdomen soft and non-tender without masses, organomegaly or hernias noted. Rectal:  No external abnormalities noted. Normal sphincter tone.hemmorhoids, internal. Guaic negative stool Genitalia:  Testes bilaterally descended without nodularity, tenderness or masses. No scrotal masses or lesions. No penis lesions or urethral discharge. Prostate:  Prostate gland firm and smooth, no enlargement, nodularity, tenderness, mass, asymmetry or induration. Msk:  No deformity or scoliosis noted of thoracic or lumbar spine.   Pulses:  R and L carotid,radial,femoral,dorsalis pedis and posterior tibial pulses are full and equal bilaterally Extremities:  No clubbing, cyanosis, edema, or deformity noted with normal full range of motion of all joints.   Neurologic:  No cranial nerve deficits noted. Station and gait are normal. Plantar reflexes are down-going bilaterally. DTRs are symmetrical throughout. Sensory, motor and coordinative functions appear intact. Skin:  Intact without suspicious lesions or rashes Cervical Nodes:  No lymphadenopathy noted Axillary Nodes:  No palpable lymphadenopathy Inguinal Nodes:  No significant adenopathy Psych:  Cognition and judgment appear intact. Alert and cooperative with normal attention span and concentration. No apparent delusions, illusions, hallucinations  Diabetes Management Exam:    Foot Exam (with socks and/or shoes not present):       Sensory-Monofilament:          Left foot: normal          Right foot: normal       Inspection:          Left foot: abnormal              Comments: callous          Right foot: abnormal             Comments: callous       Nails:          Left foot: normal          Right foot: normal   Impression & Recommendations:  Problem # 1:  HYPERTENSION (ICD-401.9) Assessment Improved  His updated medication list for this problem includes:    Benicar 20 Mg Tabs (Olmesartan medoxomil) .Marland Kitchen... Take 1 tablet by mouth once a day    Toprol Xl 50 Mg Xr24h-tab (Metoprolol succinate) .Marland Kitchen... Take 1 tablet by mouth once a day  Orders: T-Basic Metabolic Panel ([id number])  BP today: 120/82 Prior BP: 149/92 (09/27/2009)  Labs Reviewed: K+: 4.8 (06/15/2009) Creat: : 1.23 (06/15/2009)   Chol: 116 (06/15/2009)   HDL: 36 (06/15/2009)   LDL: 51 (06/15/2009)   TG: 144 (06/15/2009)  Problem # 2:  HYPERLIPIDEMIA (ICD-272.4) Assessment: Comment Only  His updated medication list for this problem includes:    Vytorin 10-40 Mg Tabs (Ezetimibe-simvastatin) .Marland Kitchen... Take one tablet by mouth every mon. wed. fri. and sun.  Orders: T-Hepatic Function [phone]) T-Lipid Profile [phone]) Cardiology Referral (Cardiology)  Labs Reviewed: SGOT: 16 (06/15/2009)   SGPT: 15 (06/15/2009)   HDL:36 (06/15/2009), 36 (06/15/2009)  LDL:51 (06/15/2009), 51 (06/15/2009)  Chol:116 (06/15/2009), 116 (06/15/2009)  Trig:144 (06/15/2009), 144 (06/15/2009)  Problem # 3:  OBESITY, UNSPECIFIED (ICD-278.00) Assessment: Unchanged  Ht: 69 (10/12/2009)   Wt: 223.75 (10/12/2009)   BMI: 33.16 (10/12/2009)  Problem # 4:  DIABETES MELLITUS, TYPE II, CONTROLLED, MILD (ICD-250.00) Assessment: Unchanged  The following medications were removed from the medication list:    Glipizide 5 Mg Tabs (Glipizide) .Marland Kitchen... Take 1 tablet by mouth two times a day His updated medication list for this problem includes:    Aspirin 81 Mg Tbec (Aspirin) .Marland Kitchen... Take 1 tablet by mouth once a day    Benicar 20 Mg Tabs (Olmesartan medoxomil) .Marland Kitchen... Take 1 tablet by mouth once a day    Onglyza 5  Mg Tabs (Saxagliptin hcl) .Marland Kitchen... Take 1 tablet by mouth once a day    Glipizide 5 Mg Tabs (Glipizide) ..... One and a half in the morning and one in the evening  Orders: T- Hemoglobin A1C ([id number])  Labs Reviewed: Creat: 1.23 (06/15/2009)    Reviewed HgBA1c results: 7.4 (10/11/2009)  7.5 (06/15/2009)  Problem # 5:  FATIGUE (ICD-780.79) Assessment: Deteriorated  Orders: Cardiology Referral (Cardiology)  Complete Medication List: 1)  Vytorin 10-40 Mg Tabs (Ezetimibe-simvastatin) .... Take one tablet by mouth every mon. wed. fri. and sun. 2)  Aspirin 81 Mg Tbec (Aspirin) .... Take 1 tablet by mouth once a day 3)  Benicar 20 Mg Tabs (Olmesartan medoxomil) .... Take 1 tablet by mouth once a day 4)  Onglyza 5 Mg Tabs (Saxagliptin hcl) .... Take 1 tablet by mouth once a day 5)  Toprol Xl 50 Mg Xr24h-tab (Metoprolol succinate) .... Take 1 tablet by mouth once a day 6)  Glipizide 5 Mg Tabs (Glipizide) .... One and a half in the morning and one in the evening  Patient Instructions: 1)  Please schedule a follow-up appointment in 3 months. 2)  You need to lose weight. Consider a lower calorie diet and regular exercise.  3)  Check your blood sugars regularly. If your readings are usually above250: or below 70 you should contact our office. 4)  Fasting sugars need to be between 80 to 120. 5)  I will send a msg to your cardiologist regarding fatigue. 6)  BMP prior to visit, ICD-9: 7)  Hepatic Panel prior to visit, ICD-9: 8)  Lipid Panel prior to visit, ICD-9:  fasting in 3 months 9)  HbgA1C prior to visit, ICD-9: 10)  Dose inc in glipizide 11)  Referral to cardiology Prescriptions: GLIPIZIDE 5 MG TABS (GLIPIZIDE) one and a half in the morning and one in the evening  #225 x 3   Entered and Authorized by:   Syliva Overman MD   Signed by:   Syliva Overman MD on 10/12/2009   Method used:   Printed then faxed to ...       [facility] education [facility])       [address] [address]       [place],  [place]  [address]       Ph: [phone]       Fax: [phone]   RxID:   [id number]

## 2010-05-03 NOTE — Progress Notes (Signed)
Summary: MEDICATION  Phone Note Call from Patient   Summary of Call: PATIENT STATES THAT HE'S OUT OF RX ON ONGLYUZA 5 MG,TOPROL XL 50 MG, NURSE ADVISE PT THAT WILL SEND TO A LOCAL PHARMACY THE PATIENT WANTS [facility]. PATIENT STATES THAT HE STILL HAVE GLIPIZIDE 5 MG BUT WANTED TO GO HEAD AND REFILL IT TOO. USUALLY THE PATIENT GET RX FROM [facility] Initial call taken by: Eugenio Hoes,  March 11, 2010 9:23 AM    Prescriptions: TOPROL XL 50 MG XR24H-TAB (METOPROLOL SUCCINATE) Take 1 tablet by mouth once a day  #30 x 0   Entered by:   Adella Hare LPN   Authorized by:   Syliva Overman MD   Signed by:   Adella Hare LPN on 04/54/0981   Method used:   [facility] to        [facility] [phone]* (retail)       [address]       [place], [place]  [address]       Ph: [phone] or [phone]       Fax: [phone]   RxID:   [id number] ONGLYZA 5 MG TABS (SAXAGLIPTIN HCL) Take 1 tablet by mouth once a day  #30 x 0   Entered by:   Adella Hare LPN   Authorized by:   Syliva Overman MD   Signed by:   Adella Hare LPN on 63/87/5643   Method used:   [facility] to        [facility] [phone]* (retail)       [address]       [place], [place]  [address]       Ph: [phone] or [phone]       Fax: [phone]   RxID:   [id number]

## 2010-05-03 NOTE — Letter (Signed)
Summary: [place] Results Engineer, agricultural at [facility]  [address] [place] [address]   Phone: [phone]  Fax: [phone]      December 24, 2009 MRN: [id number]   Robert [name] [address] [place], [place]  [address]   Dear [name],  Your test ordered by Selena Batten has been reviewed by your physician (or physician assistant) and was found to be normal or stable. Your physician (or physician assistant) felt no changes were needed at this time.  ____ Echocardiogram  _x___ Cardiac Stress Test  _x___ Lab Work  ____ Peripheral vascular study of arms, legs or neck  ____ CT scan or X-ray  ____ Lung or Breathing test  ____ Other:  No change in medical treatment at this time, per Dr. Dietrich Pates.  Enlcosed is a copy of your labwork for your records.  Thank you, Tammy Allyne Gee RN    [place]ai Bing, MD, [facility]ise Arena.C.Gaylord Shih, MD, F.A.C.C Lewayne Bunting, MD, F.A.C.C Nona Dell, MD, F.A.C.C Charlton Haws, MD, Lenise Arena.C.C

## 2010-05-03 NOTE — Letter (Signed)
Summary: med review sheet  med review sheet   Imported By: Rudene Anda 01/14/2010 16:39:29  _____________________________________________________________________  External Attachment:    Type:   Image     Comment:   External Document

## 2010-05-03 NOTE — Miscellaneous (Signed)
Summary: labs bmp,lipids,liver,a1c,06/15/2009  Clinical Lists Changes  Observations: Added new observation of ALBUMIN: 4.2 g/dL (51/88/4166 0:63) Added new observation of PROTEIN, TOT: 7.1 g/dL (01/60/1093 2:35) Added new observation of SGPT (ALT): 15 units/L (06/15/2009 9:36) Added new observation of SGOT (AST): 16 units/L (06/15/2009 9:36) Added new observation of ALK PHOS: 99 units/L (06/15/2009 9:36) Added new observation of BILI DIRECT: 0.1 mg/dL (57/32/2025 4:27) Added new observation of GFR: 9.0 mL/min (06/15/2009 9:36) Added new observation of CREATININE: 1.23 mg/dL (09/24/7626 3:15) Added new observation of BUN: 14 mg/dL (17/61/6073 7:10) Added new observation of BG RANDOM: 119 mg/dL (62/69/4854 6:27) Added new observation of CO2 PLSM/SER: 24 meq/L (06/15/2009 9:36) Added new observation of CL SERUM: 107 meq/L (06/15/2009 9:36) Added new observation of K SERUM: 4.8 meq/L (06/15/2009 9:36) Added new observation of NA: 142 meq/L (06/15/2009 9:36) Added new observation of LDL: 51 mg/dL (03/50/0938 1:82) Added new observation of HDL: 36 mg/dL (99/37/1696 7:89) Added new observation of TRIGLYC TOT: 144 mg/dL (38/01/1750 0:25) Added new observation of CHOLESTEROL: 116 mg/dL (85/27/7824 2:35) Added new observation of HGBA1C: 7.5 % (06/15/2009 9:36)

## 2010-05-03 NOTE — Progress Notes (Signed)
Summary: MEDICINE  Phone Note Call from Patient   Summary of Call: NEEDS A RX FILLED ONGLYZA 5MG    [facility] 90 DAY SUPPLY  CALL BACK AT [phone] Initial call taken by: Lind Guest,  Aug 03, 2009 9:39 AM  Follow-up for Phone Call        Rx Called In Follow-up by: Adella Hare LPN,  Aug 03, 1189 9:54 AM    Prescriptions: ONGLYZA 5 MG TABS (SAXAGLIPTIN HCL) Take 1 tablet by mouth once a day  #90 x 0   Entered by:   Adella Hare LPN   Authorized by:   Syliva Overman MD   Signed by:   Adella Hare LPN on 47/82/9562   Method used:   Faxed to ...       [facility] Environmental education officer)       [address] [address]       [place], [place]  [address]       Ph: [phone]       Fax: [phone]   RxID:   [id number]

## 2010-05-03 NOTE — Letter (Signed)
Summary: Clearance Letter  [facility]  [address], [place] [address]   Phone: [phone]  Fax: [phone]    July 09, 2009  Re:     Robert [name] Address:   [address]     [place], [place]  [address] DOB:     1951-03-22 MRN:     [id number]   Dear Delbert Harness Orthopedic Specialist:      [name] is doing extremely well with respect to coronary disease and management of cardiovascular risk factors.  He has no symptoms to suggest recurrent myocardial ischemia.  His previous testing indicates no significant infarction despite total obstruction of his coronary artery.  He is not at substantial risk, either for discontinuation of plavix or for the supposed right shoulder surgery.  No special operative precautions are necessary.  He should continue Toprol throughout the perioperative interval.           Sincerely,  Dr. Stephannie Li, [facility]

## 2010-05-03 NOTE — Assessment & Plan Note (Signed)
Summary: ROV POST STRESS TEST/TMJ   Visit Type:  Follow-up Referring Provider:  . Primary Provider:  Dr. Syliva Overman   History of Present Illness: [name] returns to the office at the request of Dr. Lodema Hong for assessment of exercise intolerance.  He notes fatigue with excessive exercise and thereafter.  He retired from work as a Academic librarian some months ago and finds that he is much less active.  He denies depression.  He reports no dyspnea, chest discomfort, orthopnea or PND.   Current Medications (verified): 1)  Aspirin 81 Mg  Tbec (Aspirin) .... Take 1 Tablet By Mouth Once A Day 2)  Benicar 20 Mg  Tabs (Olmesartan Medoxomil) .... Take 1 Tablet By Mouth Once A Day 3)  Onglyza 5 Mg Tabs (Saxagliptin Hcl) .... Take 1 Tablet By Mouth Once A Day 4)  Toprol Xl 50 Mg Xr24h-Tab (Metoprolol Succinate) .... Take 1 Tablet By Mouth Once A Day 5)  Glipizide 5 Mg Tabs (Glipizide) .... One and A Half in The Morning and One in The Evening 6)  Simvastatin 40 Mg Tabs (Simvastatin) .... Take One Tablet By Mouth Daily At Bedtime  Allergies (verified): 1)  ! * Crestor  Past History:  PMH, FH, and Social History reviewed and updated.  Past Surgical History: Rt foot surgery for ulceration- 2006 Surgical intervention for epididymitis- 1996 Right knee surgery- 11/2008  Right shoulder surgery July 16, 2009 Colonoscopy-2010; no pathologic findings   Family History: MOTHER  alive-no significant chronic medical conditions   FATHER: Deceased; hypernephroma Siblings: One sister with diabetes; 2 brothers, one with arthritis  Social History: Married; 2 children Tobacco-30 pack years; quit in 1985 Drug use-no Ethanol- discontinued use in 2005 Retired  Review of Systems       See history of present illness.  Vital Signs:  Patient profile:   60 year old male Weight:      226 pounds BMI:     33.50 O2 Sat:      95 % on Room air Pulse rate:   73 / minute BP sitting:   117 / 76   (right arm)  Vitals Entered By: Dreama Saa, CNA (December 20, 2009 3:09 PM)  O2 Flow:  Room air  Physical Exam  General:  Somewhat overweight; well developed; no acute distress:   Neck-No JVD; no carotid bruits: Lungs-No tachypnea, no rales; no rhonchi; no wheezes: Cardiovascular-normal PMI; normal S1 and S2; soft systolic ejection murmur at the cardiac base Abdomen-BS normal; soft and non-tender without masses or organomegaly:  Musculoskeletal-No deformities, no cyanosis or clubbing: Neurologic-Normal cranial nerves; symmetric strength and tone:  Skin-Warm, no significant lesions: Extremities-distal pulses intact; no edema:     Impression & Recommendations:  Problem # 1:  ATHEROSCLEROTIC CARDIOVASCULAR DISEASE (ICD-429.2) A stress nuclear study revealed good exercise tolerance with no electrocardiographic evidence for ischemia.  Myoview images were of suboptimal quality.  There was a defect inferiorly, consistent with the patient's known total occlusion of the RCA.  Partial reversibility suggested ischemia, but may have been artifact.  In any case, this was a  low risk scan, which also demonstrated normal left ventricular systolic function.  He does not require additional studies other than a TSH level.  A recent complete metabolic profile and CBC were normal.  Hemoglobin A1c level was somewhat elevated at 7.4 suggesting that intensification of his therapy for diabetes might be worthwhile.  He is encouraged to be more active and to attempt to lose weight.  I will  reassess this nice gentleman in one year.  Other Orders: T-TSH [phone]) Future Orders: T-Lipid Profile [phone]) ... 03/21/2010  Patient Instructions: 1)  Your physician recommends that you schedule a follow-up appointment in:01/2011 2)  Your physician recommends that you return for lab work in: 3 months 3)  Your physician has recommended you make the following change in your medication: stop vytorin after  current rx stay off meds for 1 month then start simvastatin 40mg  daily Prescriptions: SIMVASTATIN 40 MG TABS (SIMVASTATIN) Take one tablet by mouth daily at bedtime  #90 x 3   Entered by:   Teressa Lower RN   Authorized by:   Kathlen Brunswick, MD, [facility]   Signed by:   Kathlen Brunswick, MD, [facility] on 12/25/2009   Method used:   Print then Give to Patient   RxID:   [id number]

## 2010-05-03 NOTE — Miscellaneous (Signed)
Summary: Orders Update  Clinical Lists Changes  Orders: Added new Referral order of Nuclear Stress Test (Nuc Stress Test) - Signed 

## 2010-05-03 NOTE — Letter (Signed)
Summary: [place] Results Engineer, agricultural at [facility]  [address] [place] [address]   Phone: [phone]  Fax: [phone]      December 24, 2009 MRN: [id number]   Robert [name] [address] [place], [place]  [address]   Dear Mr. Morgan,  Your test ordered by Selena Batten has been reviewed by your physician (or physician assistant) and was found to be normal or stable. Your physician (or physician assistant) felt no changes were needed at this time.  ____ Echocardiogram  _X__ Cardiac Stress Test  __X__ Lab Work  ____ Peripheral vascular study of arms, legs or neck  ____ CT scan or X-ray  ____ Lung or Breathing test  ____ Other:  No change in medical treatment at this time, per Dr. Dietrich Pates.  Thank you, Tammy Allyne Gee RN    [place] Bing, MD, [facility] Arena.C.Gaylord Shih, MD, F.A.C.C Lewayne Bunting, MD, F.A.C.C Nona Dell, MD, F.A.C.C Charlton Haws, MD, Lenise Arena.C.C

## 2010-05-03 NOTE — Assessment & Plan Note (Signed)
Summary: office visit   Vital Signs:  Patient profile:   60 year old male Height:      69 inches Weight:      223.25 pounds BMI:     33.09 O2 Sat:      95 % Pulse rate:   76 / minute Pulse rhythm:   regular Resp:     16 per minute BP sitting:   118 / 78  (left arm) Cuff size:   large  Vitals Entered By: Everitt Amber LPN (June 15, 2009 8:39 AM)  Nutrition Counseling: Patient's BMI is greater than 25 and therefore counseled on weight management options. CC: Follow up chronic problems Is Patient Diabetic? Yes   Primary Care Provider:  Lodema Hong  CC:  Follow up chronic problems.  History of Present Illness: Reports  that he has been doing fairly well. He has not been testing daily, on avg 3 to 4 times weekly, states fastings are between 130 to 145. till taking glipizide once daily, [facility] not [facility] in correct dose.He has not been exercising and has gained weight. Denies recent fever or chills. Denies sinus pressure, nasal congestion , ear pain or sore throat. Denies chest congestion, or cough productive of sputum. Denies chest pain, palpitations, PND, orthopnea or leg swelling. Denies abdominal pain, nausea, vomitting, diarrhea or constipation. Denies change in bowel movements or bloody stool. Denies dysuria , frequency, incontinence or hesitancy. He c/o increasing right shoulder pain with reduced mobility x 1year, no recent trauma directly, he does golf and bowl. Denies headaches, vertigo, seizures. Denies depression, anxiety or insomnia. Denies  rash, lesions, or itch.     Current Medications (verified): 1)  Vytorin 10-40 Mg  Tabs (Ezetimibe-Simvastatin) .... Take One Tablet By Mouth Every Mon. Wed. Fri. and Sun. 2)  Aspirin 81 Mg  Tbec (Aspirin) .... Take 1 Tablet By Mouth Once A Day 3)  Benicar 20 Mg  Tabs (Olmesartan Medoxomil) .... 1/2 Tab By Mouth Once Daily 4)  Plavix 75 Mg  Tabs (Clopidogrel Bisulfate) .... Take 1 Tablet By Mouth Once A Day 5)   Freestyle Test  Strp (Glucose Blood) .... Once Daily Testing 6)  Glipizide 5 Mg Tabs (Glipizide) .... Take 1 Tablet By Mouth Two Times A Day 7)  Onglyza 5 Mg Tabs (Saxagliptin Hcl) .... Take 1 Tablet By Mouth Once A Day  Allergies (verified): 1)  ! * Crestor  Review of Systems      See HPI Eyes:  Denies blurring and discharge; eye exam outstanding , trying to get ins sorted out. MS:  Complains of joint pain and stiffness; right shoulder pain with reduced mobility x 1 year, fetting worse. Endo:  See HPI; Denies cold intolerance, excessive hunger, excessive thirst, excessive urination, heat intolerance, polyuria, and weight change. Heme:  Denies abnormal bruising and bleeding. Allergy:  Complains of seasonal allergies; denies hives or rash, itching eyes, and sneezing; mild.  Physical Exam  General:  Well-developed,obese,in no acute distress; alert,appropriate and cooperative throughout examination. HEENT: No facial asymmetry,  EOMI, No sinus tenderness, TM's Clear, oropharynx  pink and moist.   Chest: Clear to auscultation bilaterally.  CVS: S1, S2, No murmurs, No S3.   Abd: Soft, Nontender.  MS: Adequate ROM spine, hips,  and knees. reduced ROM right shoulder with point  tenderness anteriorly  CNS: CN 2-12 intact, power tone and sensation normal throughout.   Skin: Intact, no visible lesions or rashes.  Psych: Good eye contact, normal affect.  Memory intact, not anxious  or depressed appearing.   Diabetes Management Exam:    Foot Exam (with socks and/or shoes not present):       Sensory-Monofilament:          Left foot: normal          Right foot: normal       Inspection:          Left foot: normal          Right foot: normal       Nails:          Left foot: normal          Right foot: normal   Impression & Recommendations:  Problem # 1:  SHOULDER PAIN, RIGHT, CHRONIC (ICD-719.41) Assessment Deteriorated  His updated medication list for this problem includes:    Aspirin  81 Mg Tbec (Aspirin) .Marland Kitchen... Take 1 tablet by mouth once a day ortho referral  Problem # 2:  CAD (ICD-414.00) Assessment: Unchanged  The following medications were removed from the medication list:    Toprol Xl 50 Mg Tb24 (Metoprolol succinate) .Marland Kitchen... Take 1 tablet by mouth once a day His updated medication list for this problem includes:    Aspirin 81 Mg Tbec (Aspirin) .Marland Kitchen... Take 1 tablet by mouth once a day    Benicar 20 Mg Tabs (Olmesartan medoxomil) .Marland Kitchen... 1/2 tab by mouth once daily    Plavix 75 Mg Tabs (Clopidogrel bisulfate) .Marland Kitchen... Take 1 tablet by mouth once a day  Problem # 3:  DIABETES MELLITUS, TYPE II, CONTROLLED, MILD (ICD-250.00) Assessment: Comment Only  His updated medication list for this problem includes:    Aspirin 81 Mg Tbec (Aspirin) .Marland Kitchen... Take 1 tablet by mouth once a day    Benicar 20 Mg Tabs (Olmesartan medoxomil) .Marland Kitchen... 1/2 tab by mouth once daily    Glipizide 5 Mg Tabs (Glipizide) .Marland Kitchen... Take 1 tablet by mouth two times a day    Onglyza 5 Mg Tabs (Saxagliptin hcl) .Marland Kitchen... Take 1 tablet by mouth once a day  Orders: Glucose, (CBG) (82962) T- Hemoglobin A1C [phone]) T- Hemoglobin A1C ([id number])  Labs Reviewed: Creat: 1.28 (03/06/2009)    Reviewed HgBA1c results: 7.3 (12/23/2008)  7.4 (12/22/2008)  Problem # 4:  HYPERLIPIDEMIA (ICD-272.4) Assessment: Comment Only  His updated medication list for this problem includes:    Vytorin 10-40 Mg Tabs (Ezetimibe-simvastatin) .Marland Kitchen... Take one tablet by mouth every mon. wed. fri. and sun.  Orders: T-Hepatic Function [phone]) T-Lipid Profile [id number])  Labs Reviewed: SGOT: 11 (03/06/2009)   SGPT: 14 (03/06/2009)   HDL:39 (03/06/2009), 37 (08/17/2008)  LDL:49 (03/06/2009), 44 (08/17/2008)  Chol:125 (03/06/2009), 115 (08/17/2008)  Trig:183 (03/06/2009), 168 (08/17/2008)  Problem # 5:  HYPERTENSION (ICD-401.9) Assessment: Unchanged  The following medications were removed from the medication list:     Toprol Xl 50 Mg Tb24 (Metoprolol succinate) .Marland Kitchen... Take 1 tablet by mouth once a day His updated medication list for this problem includes:    Benicar 20 Mg Tabs (Olmesartan medoxomil) .Marland Kitchen... 1/2 tab by mouth once daily  Orders: T-Basic Metabolic Panel [id number])  BP today: 118/78 Prior BP: 124/80 (03/09/2009)  Labs Reviewed: K+: 4.6 (03/06/2009) Creat: : 1.28 (03/06/2009)   Chol: 125 (03/06/2009)   HDL: 39 (03/06/2009)   LDL: 49 (03/06/2009)   TG: 183 (03/06/2009)  Problem # 6:  OBESITY, UNSPECIFIED (ICD-278.00) Assessment: Deteriorated  Ht: 69 (06/15/2009)   Wt: 223.25 (06/15/2009)   BMI: 33.09 (06/15/2009)  Complete Medication List: 1)  Vytorin 10-40 Mg Tabs (Ezetimibe-simvastatin) .Marland KitchenMarland KitchenMarland Kitchen  Take one tablet by mouth every mon. wed. fri. and sun. 2)  Aspirin 81 Mg Tbec (Aspirin) .... Take 1 tablet by mouth once a day 3)  Benicar 20 Mg Tabs (Olmesartan medoxomil) .... 1/2 tab by mouth once daily 4)  Plavix 75 Mg Tabs (Clopidogrel bisulfate) .... Take 1 tablet by mouth once a day 5)  Freestyle Test Strp (Glucose blood) .... Once daily testing 6)  Glipizide 5 Mg Tabs (Glipizide) .... Take 1 tablet by mouth two times a day 7)  Onglyza 5 Mg Tabs (Saxagliptin hcl) .... Take 1 tablet by mouth once a day 8)  Glipizide 5 Mg Tabs (Glipizide) .... Take 1 tablet by mouth two times a day  Other Orders: T-CBC w/Diff ([id number]) T-PSA ([id number]) T- * Misc. Laboratory test [phone]) Orthopedic Referral (Ortho)  Patient Instructions: 1)  Please schedule a CPE in 3.5 months. 2)  CBC w/ Diff prior to visit, ICD-9: 3)  PSA prior to visit, ICD-9: 4)  HbgA1C prior to visit, ICD-9:  non fasting  in 3.5 months. 5)  Anemia panel 6)  It is important that you exercise regularly at least 30 minutes 5 times a week. If you develop chest pain, have severe difficulty breathing, or feel very tired , stop exercising immediately and seek medical attention. 7)  You need to lose weight. Consider a lower  calorie diet and regular exercise. Goal is 10 pounds. 8)  Pls start today taking one glizide 5mg  tab in the morning at breakfast and hALF at supper, if your sugars still rmain high , then inc to one twice daily in the next 3 weeks 9)  See your eye doctor yearly to check for diabetic eye damage. 10)  You will be referred to Dr Watt Climes about your right shoulder. Prescriptions: GLIPIZIDE 5 MG TABS (GLIPIZIDE) Take 1 tablet by mouth two times a day  #180 x 1   Entered by:   Everitt Amber LPN   Authorized by:   Syliva Overman MD   Signed by:   Everitt Amber LPN on 02/72/5366   Method used:   Print then Give to Patient   RxID:   [id number] GLIPIZIDE 5 MG TABS (GLIPIZIDE) Take 1 tablet by mouth two times a day  #180 x 1   Entered by:   Everitt Amber LPN   Authorized by:   Syliva Overman MD   Signed by:   Everitt Amber LPN on 64/33/2951   Method used:   Print then Give to Patient   RxID:   [id number] GLIPIZIDE 5 MG TABS (GLIPIZIDE) Take 1 tablet by mouth two times a day  #180 x 1   Entered and Authorized by:   Syliva Overman MD   Signed by:   Syliva Overman MD on 06/15/2009   Method used:   Print then Give to Patient   RxID:   [id number]   Laboratory Results   Blood Tests     Glucose (random): 131 mg/dL   (Normal Range: 62-376)

## 2010-05-03 NOTE — Miscellaneous (Signed)
Summary: NUCLEAR STRESS TEST 12/03/2009  Clinical Lists Changes  Observations: Added new observation of NUCLEAR NOS:   IMPRESSION:   Equivocal stress nuclear myocardial study revealing good exercise   tolerance, a negative stress EKG, normal left ventricular size and   normal left ventricular systolic function.  By scintigraphic   imaging, there was apparent mild ischemia of the mid and distal   inferior wall extending inferoapically.  The possibility that these   findings could reflect artifact resulting from patient movement   should also be considered.    Read By:  [place] Bing,  M.D.   Released By:  Unity Bing,  M.D.  Additional Information  HL7 RESULT STATUS : F  External image : [id number]  External IF Update Timestamp : 2009-12-10:16:32:03.000000  (12/03/2009 15:32)      Nuclear Study  Procedure date:  12/03/2009  Findings:        IMPRESSION:   Equivocal stress nuclear myocardial study revealing good exercise   tolerance, a negative stress EKG, normal left ventricular size and   normal left ventricular systolic function.  By scintigraphic   imaging, there was apparent mild ischemia of the mid and distal   inferior wall extending inferoapically.  The possibility that these   findings could reflect artifact resulting from patient movement   should also be considered.    Read By:  [place] Bing,  M.D.   Released By:  [place] Bing,  M.D.  Additional Information  HL7 RESULT STATUS : F  External image : [id number]  External IF Update Timestamp : 2009-12-10:16:32:03.000000

## 2010-05-03 NOTE — Progress Notes (Signed)
  Phone Note Outgoing Call   Call placed by: Teressa Lower RN,  November 26, 2009 11:52 AM Call placed to: Patient Summary of Call: [facility]- need to r/s appt, pt needs nm stress test prior to appt   PT HAS RESCHEDULED APPT AND WILL CALL MONDAY TO SET UP STRESS TEST/TMJ Initial call taken by: Teressa Lower RN,  November 26, 2009 11:53 AM  Follow-up for Phone Call        Left another message Follow-up by: Teressa Lower RN,  November 26, 2009 3:23 PM     Appended Document:  appt scheduled for 12/03/09

## 2010-05-03 NOTE — Letter (Signed)
Summary: [facility]   Imported By: Lind Guest 01/31/2010 13:33:16  _____________________________________________________________________  External Attachment:    Type:   Image     Comment:   External Document

## 2010-05-03 NOTE — Assessment & Plan Note (Signed)
Summary: F1Y   Visit Type:  1 year follow up Primary Provider:  Dr. Syliva Overman   History of Present Illness: [name] returns to the office as scheduled for continued assessment and treatment of coronary disease and cardiovascular risk factors.  Over the past year, he has done quite well from a medical standpoint.  He required both arthroscopic right knee surgery for repair of a torn meniscus as well as a right rotator cuff repair, the latter procedure performed approximately 2 months ago.  He did well with both operations and is recovering from his shoulder surgery.  He has been walking for the past few weeks, but continues to require rehabilitation on the shoulder and limitation of activity.  He notes increased fatigue, which he attributes to decreased exercise.  He also experiences a sensation of anxiety in his chest and abdomen that comes on with effort and is relieved by rest.  He has no chest discomfort no dyspnea.  He denies orthopnea, PND, lightheadedness or syncope.  Blood pressure values have been good whenever assessed.  He is unaware of recent lipid measurements.  Preventive Screening-Counseling & Management  Alcohol-Tobacco     Smoking Status: quit  1985  Current Medications (verified): 1)  Vytorin 10-40 Mg  Tabs (Ezetimibe-Simvastatin) .... Take One Tablet By Mouth Every Mon. Wed. Fri. and Sun. 2)  Aspirin 81 Mg  Tbec (Aspirin) .... Take 1 Tablet By Mouth Once A Day 3)  Benicar 20 Mg  Tabs (Olmesartan Medoxomil) .... Take 1 Tablet By Mouth Once A Day 4)  Onglyza 5 Mg Tabs (Saxagliptin Hcl) .... Take 1 Tablet By Mouth Once A Day 5)  Glipizide 5 Mg Tabs (Glipizide) .... Take 1 Tablet By Mouth Two Times A Day  Allergies (verified): 1)  ! * Crestor  Past History:  PMH, FH, and Social History reviewed and updated.  Past Medical History: ASCVD:DES to LAD D1 - 2003 in [place]; failed intervention for a totally obstructed RCA at that time; EF of 45% DIABETES  MELLITUS, TYPE II, CONTROLLED, MILD (ICD-250.00) HYPERLIPIDEMIA (ICD-272.4) HYPERTENSION (ICD-401.9) Tobacco abuse: 30 pack years; quit in 1985  Cardiac Cath  Procedure date:  01/31/2002  Findings:      [facility] Dr. Micheal Likens  EDP of 16 LMCA-large vessel; normal LAD-larger vessel; 30% mid plaque; 90% mid first diagonal Circumflex-large and codominant; no abnormalities RCA: MID total obstruction; substantial collateralization LV: Mild global hypokinesis; EF of 45% and  DES placed in the first diagonal with excellent results RCA-could not cross with a balloon   Social History: Smoking Status:  quit  1985  Review of Systems       See history of present illness.  Vital Signs:  Patient profile:   60 year old male Height:      69 inches Weight:      223 pounds O2 Sat:      96 % on Room air Pulse rate:   81 / minute BP sitting:   149 / 92  (left arm)  Vitals Entered By: Teressa Lower RN (September 27, 2009 2:37 PM)  O2 Flow:  Room air  Serial Vital Signs/Assessments:  Time      Position  BP       Pulse  Resp  Temp     By                     191/47  Marrion Coy, CNA   Physical Exam  General:  Mildly overweight; well developed; no acute distress:   Neck-No JVD; no carotid bruits: Lungs-No tachypnea, no rales; no rhonchi; no wheezes: Cardiovascular-normal PMI; normal S1 and S2:modest systolic ejection murmur at the cardiac base Abdomen-BS normal; soft and non-tender without masses or organomegaly:  Musculoskeletal-No deformities, no cyanosis or clubbing: Neurologic-Normal cranial nerves; symmetric strength and tone:  Skin-Warm, no significant lesions: Extremities-decreased left posterior tibial pulse; no edema:     Impression & Recommendations:  Problem # 1:  ATHEROSCLEROTIC CARDIOVASCULAR DISEASE (ICD-429.2) No symptoms at present to suggest recurrent myocardial ischemia.  Focus will remain on optimal control of  cardiovascular risk factors.  He has been taking clopidogrel for 8 years, and no longer has the indication to continue to do so.  That medicine will be discontinued, but aspirin will be continued.  Problem # 2:  DIABETES MELLITUS, TYPE II, CONTROLLED, MILD (ICD-250.00) Patient has been intolerant to a number of hypoglycemic agents.  Hemoglobin A1c level has been gradually increasing from 6.7 some years ago to 7.4.  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  In the face of diabetes, particularly stringent control of hypertension and hyperlipidemia is warranted.  Problem # 3:  HYPERLIPIDEMIA (ICD-272.4)  Recent lipid profile performed a few months ago was quite good with total cholesterol 116, triglycerides 144, HDL 36 and LDL of 51.  Current therapy is effective and will be continued.  Problem # 4:  HYPERTENSION (ICD-401.9) Blood pressure was repeated at the end of the visit yielding a value of 130/90.  Although that was somewhat improved, it does not meet criteria for control of hypertension in a patient with diabetes.  Benicar dosage will be increased to 20 mg q.d. with continued monitoring of blood pressure values.  I will plan to see this nice gentleman in one year.  Patient Instructions: 1)  Your physician recommends that you schedule a follow-up appointment in: 1 year 2)  Your physician has recommended you make the following change in your medication: stop plavix, increase benicar to 20mg  daily Prescriptions: BENICAR 20 MG  TABS (OLMESARTAN MEDOXOMIL) Take 1 tablet by mouth once a day  #90 x 3   Entered by:   Teressa Lower RN   Authorized by:   Kathlen Brunswick, MD, [facility]   Signed by:   Teressa Lower RN on 09/27/2009   Method used:   Faxed to ...       [facility] [employer])       [address] [address]       [place], [place]  [address]       Ph: [phone]       Fax: [phone]   RxID:   [id number]

## 2010-05-03 NOTE — Progress Notes (Signed)
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 Lodema Hong Call placed to: Specialist Summary of Call: Pt reports 1 yr h/o progressive exertional fatigue, eg washing car, yard work, has to rest after for approx , he has had 3 stents in 2003, and reports last imaging in 2005.  Pls. consider need for testing: stress or cath Initial call taken by: Syliva Overman MD,  October 12, 2009 9:33 AM  Follow-up for Phone Call        We will arrange a stresss-nuclear study and office F/U thereafter.  [place] Bing, M.D.  Follow-up by: Kathlen Brunswick, MD, [facility],  October 13, 2009 3:37 PM  Additional Follow-up for Phone Call Additional follow up Details #1::        thanks Additional Follow-up by: Syliva Overman MD,  October 13, 2009 4:57 PM

## 2010-05-05 NOTE — Assessment & Plan Note (Signed)
Summary: office visit   Vital Signs:  Patient profile:   60 year old male Height:      69 inches Weight:      225.50 pounds BMI:     33.42 O2 Sat:      97 % on Room air Pulse rate:   78 / minute Pulse rhythm:   regular Resp:     16 per minute BP sitting:   108 / 70  (left arm)  Vitals Entered By: Adella Hare LPN (March 15, 2010 8:37 AM)  Nutrition Counseling: Patient's BMI is greater than 25 and therefore counseled on weight management options.  O2 Flow:  Room air CC: follow-up visit- right shoulder pain Is Patient Diabetic? Yes   Primary Care Kisa Fujii:  Dr. Syliva Overman  CC:  follow-up visit- right shoulder pain.  History of Present Illness: Reports  that he i doing better where his blood sugars are concerned ,and he has his log with him which verifies this Denies recent fever or chills. Denies sinus pressure, nasal congestion , ear pain or sore throat. Denies chest congestion, or cough productive of sputum. Denies chest pain, palpitations, PND, orthopnea or leg swelling. Denies abdominal pain, nausea, vomitting, diarrhea or constipation. Denies change in bowel movements or bloody stool.Denies  joint pain, swelling, or reduced mobility. Denies headaches, vertigo, seizures. Denies depression, anxiety or insomnia. Denies  rash, lesions, or itch.     Current Medications (verified): 1)  Aspirin 81 Mg  Tbec (Aspirin) .... Take 1 Tablet By Mouth Once A Day 2)  Benicar 20 Mg  Tabs (Olmesartan Medoxomil) .... Take 1 Tablet By Mouth Once A Day 3)  Onglyza 5 Mg Tabs (Saxagliptin Hcl) .... Take 1 Tablet By Mouth Once A Day 4)  Toprol Xl 50 Mg Xr24h-Tab (Metoprolol Succinate) .... Take 1 Tablet By Mouth Once A Day 5)  Simvastatin 40 Mg Tabs (Simvastatin) .... Take One Tablet By Mouth Daily At Bedtime 6)  Glipizide 5 Mg Tabs (Glipizide) .... Two Tablets Twice Daily 7)  Freestyle Test  Strp (Glucose Blood) .... Use As Directed  Allergies (verified): 1)  ! *  Crestor  Review of Systems      See HPI Eyes:  Denies blurring and discharge. MS:  Complains of joint pain and stiffness; increased right  shoulder pain with reduced mobility. Endo:  Denies cold intolerance, excessive hunger, excessive thirst, and excessive urination; pt has his log which shows marked improvement with fastring sugars  averaging less than 120. He has had hypoglycemia into the 60's occasionally when he has eaten late. Heme:  Denies abnormal bruising and bleeding. Allergy:  Denies hives or rash and itching eyes.  Physical Exam  General:  Well-developed,well-nourished,in no acute distress; alert,appropriate and cooperative throughout examination HEENT: No facial asymmetry,  EOMI, No sinus tenderness, TM's Clear, oropharynx  pink and moist.   Chest: Clear to auscultation bilaterally.  CVS: S1, S2, No murmurs, No S3.   Abd: Soft, Nontender.  MS: Adequate ROM spine, hips,  and knees. reduced right shoulder mobility Ext: No edema.   CNS: CN 2-12 intact, power tone and sensation normal throughout.   Skin: Intact, no visible lesions or rashes.  Psych: Good eye contact, normal affect.  Memory intact, not anxious or depressed appearing.    Impression & Recommendations:  Problem # 1:  HYPERTENSION (ICD-401.9) Assessment Unchanged  His updated medication list for this problem includes:    Benicar 20 Mg Tabs (Olmesartan medoxomil) .Marland Kitchen... Take 1 tablet by mouth once a  day    Toprol Xl 50 Mg Xr24h-tab (Metoprolol succinate) .Marland Kitchen... Take 1 tablet by mouth once a day  BP today: 108/70 Prior BP: 112/80 (01/13/2010)  Prior 10 Yr Risk Heart Disease: 9 % (12/03/2009)  Labs Reviewed: K+: 4.4 (03/14/2010) Creat: : 1.31 (03/14/2010)   Chol: 110 (01/11/2010)   HDL: 36 (01/11/2010)   LDL: 37 (01/11/2010)   TG: 183 (01/11/2010)  Problem # 2:  HYPERLIPIDEMIA (ICD-272.4) Assessment: Unchanged  His updated medication list for this problem includes:    Simvastatin 40 Mg Tabs  (Simvastatin) .Marland Kitchen... Take one tablet by mouth daily at bedtime  Labs Reviewed: SGOT: 15 (01/11/2010)   SGPT: 12 (01/11/2010) Low fat dietdiscussed and encouraged  Prior 10 Yr Risk Heart Disease: 9 % (12/03/2009)   HDL:36 (01/11/2010), 36 (06/15/2009)  LDL:37 (01/11/2010), 51 (06/15/2009)  Chol:110 (01/11/2010), 116 (06/15/2009)  Trig:183 (01/11/2010), 144 (06/15/2009)  Problem # 3:  SHOULDER PAIN, RIGHT, CHRONIC (ICD-719.41) Assessment: Deteriorated  His updated medication list for this problem includes:    Aspirin 81 Mg Tbec (Aspirin) .Marland Kitchen... Take 1 tablet by mouth once a day    Ibuprofen 800 Mg Tabs (Ibuprofen) .Marland Kitchen... Take 1 tablet by mouth three times a day  Orders: Ketorolac-Toradol 15mg  (B1478) Admin of Therapeutic Inj  intramuscular or subcutaneous (29562)  Problem # 4:  DIABETES MELLITUS, TYPE II, CONTROLLED, MILD (ICD-250.00) Assessment: Comment Only  His updated medication list for this problem includes:    Aspirin 81 Mg Tbec (Aspirin) .Marland Kitchen... Take 1 tablet by mouth once a day    Benicar 20 Mg Tabs (Olmesartan medoxomil) .Marland Kitchen... Take 1 tablet by mouth once a day    Onglyza 5 Mg Tabs (Saxagliptin hcl) .Marland Kitchen... Take 1 tablet by mouth once a day    Glipizide 5 Mg Tabs (Glipizide) .Marland Kitchen..Marland Kitchen Two tablets twice daily Patient advised to reduce carbs and sweets, commit to regular physical activity, take meds as prescribed, test blood sugars as directed, and attempt to lose weight , to improve blood sugar control.  Orders: T- Hemoglobin A1C (13086-57846)  Labs Reviewed: Creat: 1.31 (03/14/2010)    Reviewed HgBA1c results: 7.7 (01/11/2010)  7.4 (10/11/2009)  Complete Medication List: 1)  Aspirin 81 Mg Tbec (Aspirin) .... Take 1 tablet by mouth once a day 2)  Benicar 20 Mg Tabs (Olmesartan medoxomil) .... Take 1 tablet by mouth once a day 3)  Onglyza 5 Mg Tabs (Saxagliptin hcl) .... Take 1 tablet by mouth once a day 4)  Toprol Xl 50 Mg Xr24h-tab (Metoprolol succinate) .... Take 1 tablet by  mouth once a day 5)  Simvastatin 40 Mg Tabs (Simvastatin) .... Take one tablet by mouth daily at bedtime 6)  Glipizide 5 Mg Tabs (Glipizide) .... Two tablets twice daily 7)  Freestyle Test Strp (glucose Blood)  .... Use as directed 8)  Ibuprofen 800 Mg Tabs (Ibuprofen) .... Take 1 tablet by mouth three times a day  Patient Instructions: 1)  Please schedule a follow-up appointment in 4 months. 2)  It is important that you exercise regularly at least 30 minutes 5 times a week. If you develop chest pain, have severe difficulty breathing, or feel very tired , stop exercising immediately and seek medical attention.This will help yourblood sugars 3)  You need to lose weight. Consider a lower calorie diet and regular exercise.  4)  HbgA1C prior to visit, ICD-9:  Jan 15 or after, we will call with result 5)  Continue care in diet and taking meds and testing. 6)  Ok to test  twice daily still since you are still not fully controlled and having lows. 7)  VITAL to eat on schedule Prescriptions: IBUPROFEN 800 MG TABS (IBUPROFEN) Take 1 tablet by mouth three times a day  #30 x 0   Entered and Authorized by:   Syliva Overman MD   Signed by:   Syliva Overman MD on 03/15/2010   Method used:   [facility] to        [facility] [phone]* (retail)       [address]       [place], [place]  [address]       Ph: [phone] or [phone]       Fax: [phone]   RxID:   [id number]    Medication Administration  Injection # 1:    Medication: Ketorolac-Toradol 15mg     Diagnosis: SHOULDER PAIN, RIGHT, CHRONIC (ICD-719.41)    Route: IM    Site: RUOQ gluteus    Exp Date: 09/02/2011    Lot #: [id number]    Mfr: [email or web address]    Comments: toradol 60mg  given    Patient tolerated injection without complications    Given by: Adella Hare LPN (March 15, 2010 9:32 AM)  Orders Added: 1)  Est. Patient Level IV [56387] 2)  T- Hemoglobin A1C [83036-23375] 3)  Ketorolac-Toradol 15mg   [J1885] 4)  Admin of Therapeutic Inj  intramuscular or subcutaneous [96372]   viagra sample given [id number] 07/15  Medication Administration  Injection # 1:    Medication: Ketorolac-Toradol 15mg     Diagnosis: SHOULDER PAIN, RIGHT, CHRONIC (ICD-719.41)    Route: IM    Site: RUOQ gluteus    Exp Date: 09/02/2011    Lot #: [id number]    Mfr: [email or web address]    Comments: toradol 60mg  given    Patient tolerated injection without complications    Given by: Adella Hare LPN (March 15, 2010 9:32 AM)  Orders Added: 1)  Est. Patient Level IV [63016] 2)  T- Hemoglobin A1C [83036-23375] 3)  Ketorolac-Toradol 15mg  [J1885] 4)  Admin of Therapeutic Inj  intramuscular or subcutaneous [01093]

## 2010-05-05 NOTE — Progress Notes (Signed)
Summary: MEDICATION   Phone Note Call from Patient   Summary of Call: PATIENT CALLED STATES THAT HE IS OUT [facility] AND BUT WOULD RATHER HAVE ALL MEDICATION SENT TO [facility] ALSO NEED TEST STRIPS Initial call taken by: Eugenio Hoes,  March 24, 2010 11:53 AM    Prescriptions: FREESTYLE TEST  STRP (GLUCOSE BLOOD) Use as directed  #90 x 0   Entered by:   Adella Hare LPN   Authorized by:   Syliva Overman MD   Signed by:   Adella Hare LPN on 84/69/6295   Method used:   Faxed to ...       [facility])       [address] [address]       [place], [place]  [address]       Ph: [phone]       Fax: [phone]   RxID:   [id number] GLIPIZIDE 5 MG TABS (GLIPIZIDE) two tablets twice daily  #90 x 0   Entered by:   Adella Hare LPN   Authorized by:   Syliva Overman MD   Signed by:   Adella Hare LPN on 18/84/1660   Method used:   Faxed to ...       [facility])       [address] [address]       [place], [place]  [address]       Ph: [phone]       Fax: [phone]   RxID:   [id number] SIMVASTATIN 40 MG TABS (SIMVASTATIN) Take one tablet by mouth daily at bedtime  #90 x 0   Entered by:   Adella Hare LPN   Authorized by:   Syliva Overman MD   Signed by:   Adella Hare LPN on 73/71/0626   Method used:   Faxed to ...       [facility])       [address] [address]       [place], [place]  [address]       Ph: [phone]       Fax: [phone]   RxID:   [id number] TOPROL XL 50 MG XR24H-TAB (METOPROLOL SUCCINATE) Take 1 tablet by mouth once a day  #90 x 0   Entered by:   Adella Hare LPN   Authorized by:   Syliva Overman MD   Signed by:   Adella Hare LPN on 85/27/7824   Method used:   Faxed to ...       [facility] education [facility])       [address] [address]       [place], [place]  [address]       Ph: [phone]       Fax: [phone]   RxID:   [id number] ONGLYZA 5 MG TABS (SAXAGLIPTIN HCL) Take 1 tablet by mouth once  a day  #90 x 0   Entered by:   Adella Hare LPN   Authorized by:   Syliva Overman MD   Signed by:   Adella Hare LPN on 05/39/7673   Method used:   Faxed to ...       [facility] education [facility])       [address] [address]       [place], [place]  [address]       Ph: [phone]       Fax: [phone]   RxID:   [id number] BENICAR 20 MG  TABS (OLMESARTAN MEDOXOMIL) Take 1 tablet by mouth once a day  #90 x 0  Entered by:   Adella Hare LPN   Authorized by:   Syliva Overman MD   Signed by:   Adella Hare LPN on 16/01/9603   Method used:   Faxed to ...       [facility])       [address] [address]       [place], [place]  [address]       Ph: [phone]       Fax: [phone]   RxID:   [id number] GLIPIZIDE 5 MG TABS (GLIPIZIDE) two tablets twice daily  #120 x 0   Entered by:   Adella Hare LPN   Authorized by:   Syliva Overman MD   Signed by:   Adella Hare LPN on 01/28/2535   Method used:   [facility]ronically to        [facility] [phone]* (retail)       [address]       [place], [place]  [address]       Ph: [phone] or [phone]       Fax: [phone]   RxID:   [id number]

## 2010-05-19 NOTE — Progress Notes (Signed)
  Phone Note Call from Patient   Summary of Call: patient states he needs test strips for his freestyle flash diabetic meter.   [phone] Initial call taken by: Rosine Beat,  January 27, 2010 3:48 PM

## 2010-06-28 ENCOUNTER — Telehealth: Payer: Self-pay | Admitting: Family Medicine

## 2010-06-29 MED ORDER — GLIPIZIDE 5 MG PO TABS: 10.0000 mg | ORAL_TABLET | Freq: Two times a day (BID) | ORAL | Status: DC

## 2010-07-07 NOTE — Telephone Encounter (Signed)
Medicine sent in

## 2010-07-18 ENCOUNTER — Ambulatory Visit: Payer: Self-pay | Admitting: Family Medicine

## 2010-07-25 ENCOUNTER — Telehealth: Payer: Self-pay | Admitting: Family Medicine

## 2010-07-25 ENCOUNTER — Encounter: Payer: Self-pay | Admitting: Family Medicine

## 2010-07-25 DIAGNOSIS — E119 Type 2 diabetes mellitus without complications: Secondary | ICD-10-CM

## 2010-07-25 DIAGNOSIS — I10 Essential (primary) hypertension: Secondary | ICD-10-CM

## 2010-07-25 DIAGNOSIS — E785 Hyperlipidemia, unspecified: Secondary | ICD-10-CM

## 2010-07-25 NOTE — Telephone Encounter (Signed)
Egfr, lipid hba1c and microalb pls

## 2010-07-26 ENCOUNTER — Encounter: Payer: Self-pay | Admitting: Family Medicine

## 2010-07-26 LAB — HEMOGLOBIN A1C: Hgb A1c MFr Bld: 7.6 % — ABNORMAL HIGH (ref <5.7)

## 2010-07-26 LAB — LIPID PANEL
HDL: 37 mg/dL — ABNORMAL LOW (ref >39)
LDL Cholesterol: 62 mg/dL (ref 0–99)
Triglycerides: 222 mg/dL — ABNORMAL HIGH (ref <150)
VLDL: 44 mg/dL — ABNORMAL HIGH (ref 0–40)

## 2010-07-26 NOTE — Telephone Encounter (Signed)
Lab sent  Patient aware

## 2010-07-27 ENCOUNTER — Ambulatory Visit (INDEPENDENT_AMBULATORY_CARE_PROVIDER_SITE_OTHER): Payer: Managed Care, Other (non HMO) | Admitting: Family Medicine

## 2010-07-27 ENCOUNTER — Encounter: Payer: Self-pay | Admitting: Family Medicine

## 2010-07-27 VITALS — BP 122/82 | HR 71 | Resp 16 | Ht 69.0 in | Wt 228.0 lb

## 2010-07-27 DIAGNOSIS — E785 Hyperlipidemia, unspecified: Secondary | ICD-10-CM

## 2010-07-27 DIAGNOSIS — E1165 Type 2 diabetes mellitus with hyperglycemia: Secondary | ICD-10-CM

## 2010-07-27 DIAGNOSIS — R1013 Epigastric pain: Secondary | ICD-10-CM

## 2010-07-27 DIAGNOSIS — I10 Essential (primary) hypertension: Secondary | ICD-10-CM

## 2010-07-27 DIAGNOSIS — I2510 Atherosclerotic heart disease of native coronary artery without angina pectoris: Secondary | ICD-10-CM

## 2010-07-27 DIAGNOSIS — E118 Type 2 diabetes mellitus with unspecified complications: Secondary | ICD-10-CM

## 2010-07-27 LAB — COMPLETE METABOLIC PANEL WITH GFR
ALT: 19 U/L (ref 0–53)
AST: 18 U/L (ref 0–37)
Calcium: 9 mg/dL (ref 8.4–10.5)
Chloride: 104 mEq/L (ref 96–112)
Creat: 1.33 mg/dL (ref 0.40–1.50)
Sodium: 138 mEq/L (ref 135–145)
Total Protein: 7.1 g/dL (ref 6.0–8.3)

## 2010-07-27 LAB — MICROALBUMIN / CREATININE URINE RATIO: Microalb, Ur: 2.03 mg/dL — ABNORMAL HIGH (ref 0.00–1.89)

## 2010-07-27 MED ORDER — SAXAGLIPTIN HCL 5 MG PO TABS: 5.0000 mg | ORAL_TABLET | Freq: Every day | ORAL | Status: DC

## 2010-07-27 MED ORDER — ONETOUCH DELICA LANCETS MISC: Status: DC

## 2010-07-27 MED ORDER — METOPROLOL SUCCINATE ER 50 MG PO TB24: 50.0000 mg | ORAL_TABLET | Freq: Every day | ORAL | Status: DC

## 2010-07-27 MED ORDER — SIMVASTATIN 40 MG PO TABS: 40.0000 mg | ORAL_TABLET | Freq: Every day | ORAL | Status: DC

## 2010-07-27 MED ORDER — GLIPIZIDE 5 MG PO TABS: 10.0000 mg | ORAL_TABLET | Freq: Two times a day (BID) | ORAL | Status: DC

## 2010-07-27 MED ORDER — GLUCOSE BLOOD VI STRP: ORAL_STRIP | Status: DC

## 2010-07-27 NOTE — Patient Instructions (Addendum)
F/U in 3 months  Your triglycerides and blood sugar are too high. You need to go to diabetic class asap  Your good cholesterol is low pls commit to exercise at least 210 mins per week.  You will be referred for gall bladder studies  Fasting lipid , CMP , EGFR and hBA1C in 3 months.  Call if blood sugars remain uncontrolled in the next 4 weeks pls.  NEW METER TODAY , WITH TWICE DAILY TESTING

## 2010-08-08 DIAGNOSIS — R1013 Epigastric pain: Secondary | ICD-10-CM | POA: Insufficient documentation

## 2010-08-08 NOTE — Assessment & Plan Note (Addendum)
Uncontrolled. Medication compliance addressed. Commitment to regular exercise, and healthy  eating habits with portion control discussed. Importance of carb counting , and low fat diet discussed, and literature offered. No changes in medication at this time.

## 2010-08-08 NOTE — Progress Notes (Signed)
  Subjective:    Patient ID: Robert [name], male    DOB: 04-Sep-1950, 60 y.o.   MRN: [id number]  HPI HYPERTENSION Disease Monitoring Blood pressure range-unknown Chest pain- no      Dyspnea- yes Medications Compliance- good Lightheadedness- no   Edema- no   DIABETES Disease Monitoring Blood Sugar ranges-testing irregularly, but fastings as high as in the 130's Polyuria- no New Visual problems- no Medications Compliance- fair, needs to improve Hypoglycemic symptoms- no   HYPERLIPIDEMIA Disease Monitoring See symptoms for Hypertension Medications Compliance- good RUQ pain- yes with bloating  Muscle aches- no     Review of Systems    Denies recent fever or chills. Denies sinus pressure, nasal congestion, ear pain or sore throat. Denies chest congestion, productive cough or wheezing. Denies chest pains, palpitations, paroxysmal nocturnal dyspnea, orthopnea and leg swelling. Notes exertional fatigue Intermittent RUQ pain with bloating and  Nausea,no  vomiting,diarrhea or constipation.  Denies rectal bleeding or change in bowel movement. Denies dysuria, frequency, hesitancy or incontinence. Denies joint pain, swelling and limitation in mobility. Denies headaches, seizure, numbness, or tingling. Denies depression, anxiety or insomnia. Denies skin break down or rash.     Objective:   Physical Exam    Patient alert and oriented and in no Cardiopulmonary distress.  HEENT: No facial asymmetry, EOMI, no sinus tenderness, TM's clear, Oropharynx pink and moist.  Neck supple no adenopathy.  Chest: Clear to auscultation bilaterally.  CVS: S1, S2 no murmurs, no S3.  ABD: Soft non tender. Bowel sounds normal.  Ext: No edema  MS: Adequate ROM spine, shoulders, hips and knees.  Skin: Intact, no ulcerations or rash noted.  Psych: Good eye contact, normal affect. Memory intact not anxious or depressed appearing.  CNS: CN 2-12 intact, power, tone and sensation normal  throughout.     Assessment & Plan:

## 2010-08-08 NOTE — Assessment & Plan Note (Signed)
Stable at this time 

## 2010-08-08 NOTE — Assessment & Plan Note (Signed)
deteriorated 

## 2010-08-08 NOTE — Assessment & Plan Note (Signed)
Sub optimal, triglycerides high, but cholesterol is within range, re-educated re low fat diet and encouraged more diligence with this

## 2010-08-09 ENCOUNTER — Other Ambulatory Visit: Payer: Self-pay | Admitting: Family Medicine

## 2010-08-09 DIAGNOSIS — R1013 Epigastric pain: Secondary | ICD-10-CM

## 2010-08-09 DIAGNOSIS — E119 Type 2 diabetes mellitus without complications: Secondary | ICD-10-CM

## 2010-08-10 MED ORDER — GLUCOSE BLOOD VI STRP: ORAL_STRIP | Status: DC

## 2010-08-10 NOTE — Telephone Encounter (Signed)
Sent as requested.

## 2010-08-12 ENCOUNTER — Ambulatory Visit (HOSPITAL_COMMUNITY)
Admission: RE | Admit: 2010-08-12 | Discharge: 2010-08-12 | Disposition: A | Discharge status: Home / Self Care | Payer: Managed Care, Other (non HMO) | Source: Ambulatory Visit | Attending: Family Medicine | Admitting: Family Medicine

## 2010-08-12 DIAGNOSIS — K3189 Other diseases of stomach and duodenum: Secondary | ICD-10-CM | POA: Insufficient documentation

## 2010-08-12 DIAGNOSIS — R1013 Epigastric pain: Secondary | ICD-10-CM | POA: Insufficient documentation

## 2010-08-16 NOTE — Letter (Signed)
September 09, 2007    Robert [name]. Robert [name], M.D.  [address]  [place], [place] [address]   RE:  [name], [name]  MRN:  [id number]  /  DOB:  04/08/1950   Dear [name]:   It was my pleasure reevaluating [name] in the office today at your  request for EKG changes.  As you know, this nice gentleman presented  with coronary disease in 2003 and underwent percutaneous intervention in  the first diagonal.  He has chronic total occlusion of the right  coronary.  He has been essentially asymptomatic since, even before that  intervention.  He notes some momentary sharp chest pain a few times per  week.  Blood pressure, diabetic, and hyperlipidemic control have been  good.  He was unable to tolerate Januvia and has been placed on Avandia  8 mg daily.  His other medications are unchanged from his last visit.   An EKG was recently done in your office showing an incomplete right  bundle branch block.  This was not present on his most recent tracing,  but was present on the tracing before that.  He continues to play golf  without difficulty.   On exam, pleasant and well-appearing gentleman.  The weight is 217, 4  pounds more than in September of last year.  Blood pressure 130/80,  heart rate 85 and regular, respirations 14.  Neck:  No jugular venous  distention; normal carotid upstrokes without bruits.  Lungs:  Clear.  Cardiac:  Normal first and second heart sounds; fourth heart sound  present.  Abdomen:  Soft and nontender; no organomegaly.  Extremities:  No edema; normal distal pulses.   EKG:  Normal sinus rhythm; borderline left atrial abnormality; leftward  axis; delayed R-wave progression; incomplete right bundle branch block;  possible prior inferior MI.  Compared with a prior tracing of January 01, 2007:  Incomplete right bundle branch block now present.   IMPRESSION:  [name] appears to be doing well from a symptomatic  standpoint.  His EKG changes do not necessarily  suggest progression of  coronary disease; however, it has been 6 years since his last stress  test.  We will perform a stress echocardiogram, mostly to assess  symptoms and exercise tolerance.  I would expect some ischemia in the  distribution of the right coronary artery.  As long as this is  modest,  continued medical therapy will be appropriate.   Avandia is relatively contraindicated in patients with either congestive  heart failure or coronary disease.  I would suggest substitution of  another agent, either Actos or a sulfonylurea.    Sincerely,      Gerrit Friends. Dietrich Pates, MD, [facility]  Electronically Signed    RMR/MedQ  DD: 09/09/2007  DT: 09/10/2007  Job #: [id number]

## 2010-08-16 NOTE — Letter (Signed)
January 01, 2007    [name]. Lodema Hong, M.D.  [address], [address]  [place], [place] [address]   RE:  BRELAN, HANNEN  MRN:  [id number]  /  DOB:  [birth year]   Dear [name]:   It was my pleasure reassessing Mr. Wetherell in the office today at your  request. He was previously scheduled to return for his annual visit in  December. He has done well since I last saw him in 2007. He remains  active including playing golf and bowling. 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 He  reported some chest discomfort to you. This is a mild to moderate  aching, localized in the left inframammary region that occurs  unpredictably and lasts a matter of seconds. There are no associated  symptoms. There is no radiation. He has noted similar symptoms for  years, and has taken any specific action to further evaluate or treat  these minor issues.   CURRENT MEDICATIONS:  1. Aspirin 81 mg daily.  2. Clopidogrel 75 mg daily.  3. Vytorin 10/40 mg daily.  4. Toprol 50 mg daily.  5. Benicar 10 mg daily.  6. Avandia 8 mg daily.   PHYSICAL EXAMINATION:  Vigorous appearing gentleman in no acute  distress. Weight is 213, four pounds increase since last year and 29  pound increase since 2005. Blood pressure 110/70, heart rate 80 and  regular. Respirations 16.  NECK: No jugular venous distention; normal carotid upstrokes without  bruits.  LUNGS:  Clear.  CARDIAC: Normal 1st and 2nd heart sounds; modest systolic ejection  murmur.  ABDOMEN: Soft and nontender; no masses; no organomegaly.  EXTREMITIES: Normal distal pulses; no edema.   The EKG: Normal sinus rhythm; minor nonspecific T-wave abnormality that  is new when compared to March 15, 2004. However, RSR prime  configuration is no longer present in lead V1.   IMPRESSION:  Mr. Ancheta is doing well in general. His minor chest  discomfort is so brief as to not allow treatment or further  evaluation.  Since these symptoms are so atypical. I do not believe that functional  testing is required. His current medical regimen is excellent. A recent  lipid profile was extremely good. Vaccinations are up-to-date.  Hemoglobin A1c level is reportedly 6.7. Mr. Loftus management of his  cardiovascular risk factors is impressive except for his weight. He will  pay more attention to this in the future, try to increase exercise and  plan to return to see me in one year.    Sincerely,      Gerrit Friends. Dietrich Pates, MD, [facility]  Electronically Signed    RMR/MedQ  DD: 01/01/2007  DT: 01/01/2007  Job #: [id number]

## 2010-08-16 NOTE — Procedures (Signed)
NAMEHARPREET, Robert [name]               ACCOUNT NO.:  [id number]   MEDICAL RECORD NO.:  [id number]          PATIENT TYPE:  OUT   LOCATION:  RAD                           FACILITY:  [facility]   PHYSICIAN:  Gerrit Friends. Dietrich Pates, MD, FACCDATE OF BIRTH:  [birth year]   DATE OF PROCEDURE:  09/16/2007  DATE OF DISCHARGE:                                ECHOCARDIOGRAM   REFERRING PHYSICIAN:  [name]. Simpson, MD   CLINICAL DATA:  This is a 60 year old gentleman with known coronary  disease, newly noted EKG abnormalities and minor chest discomfort.  1. Treadmill exercise performed to a workload of 15 METS and a heart      rate of 174, which is in excess of 100% of age-predicted maximum.      Exercise discontinued due to dyspnea and fatigue; no chest pain      reported.  2. Blood pressure increased from a resting value of 130/75 to 160/70      during exercise and 170/70 in recovery, a normal response.  3. No arrhythmias noted.  4. EKG:  Normal sinus rhythm; incomplete right bundle branch block;      otherwise unremarkable.  Stress EKG:  No significant change.  1. Echocardiogram:  Normal left atrium, right atrium, proximal      ascending aorta, mitral valve, aortic valve, tricuspid valve, and      right ventricle.  Normal left ventricular size and function; no      LVH.  Post-exercise echocardiogram:  Increase in contractility in all  myocardial segments.   IMPRESSION:  Negative stress echocardiogram revealing excellent exercise  tolerance, a very high achieved heart rate, no symptoms to suggest  myocardial ischemia and neither electrocardiographic nor  echocardiographic evidence for ischemia or infarction.  Other findings  as noted.      Gerrit Friends. Dietrich Pates, MD, [facility]  Electronically Signed     RMR/MEDQ  D:  09/16/2007  T:  09/17/2007  Job:  [id number]

## 2010-08-19 NOTE — Op Note (Signed)
Robert [name], Robert [name]               ACCOUNT NO.:  [id number]   MEDICAL RECORD NO.:  [id number]          PATIENT TYPE:  AMB   LOCATION:  DAY                           FACILITY:  [facility]   PHYSICIAN:  Lionel December, M.D.    DATE OF BIRTH:  28-Apr-1950   DATE OF PROCEDURE:  08/23/2004  DATE OF DISCHARGE:                                 OPERATIVE REPORT   PROCEDURE:  Colonoscopy.   INDICATION:  Robert [name] is a 60 year old Afro-American male who is here for  screening colonoscopy. Family history is negative for colorectal carcinoma.  He has occasional hematochezia felt to be secondary to hemorrhoids.  Procedure risks were reviewed the patient, and informed consent was  obtained.   PREMEDICATION:  Demerol 25 mg IV, Versed 5 mg IV.   FINDINGS:  Procedure performed in endoscopy suite. The patient's vital signs  and O2 saturation were monitored during procedure and remained stable. The  patient was placed in left lateral position. Rectal examination performed.  No abnormality noted on external or digital exam. Olympus videoscope was  placed in rectum and advanced under vision into sigmoid colon and beyond.  Preparation was excellent. Few small diverticula were noted in sigmoid  colon. Scope was easily passed into cecum which was identified by  appendiceal orifice and ileocecal valve. Pictures taken for the record. As  the scope was withdrawn, colonic mucosa was carefully examined and was  normal throughout. Rectal mucosa similarly was normal. Scope was retroflexed  to examine anorectal junction, and small hemorrhoids were noted below the  dentate line. Endoscope was straightened and withdrawn. The patient  tolerated the procedure well.   FINAL DIAGNOSIS:  Few small diverticula at sigmoid colon and external  hemorrhoids, otherwise normal colonoscopy.   RECOMMENDATIONS:  1. High-fiber diet.  2. Sugar-free Citrucel or equivalent one tablespoonful daily.  3. Yearly Hemoccults.  4. He may consider  next screening exam in 10 years from now.        NR/MEDQ  D:  08/23/2004  T:  08/23/2004  Job:  [id number]

## 2010-08-19 NOTE — Letter (Signed)
March 08, 2006    Robert [name]. Robert [name], M.D.  [address]  [place], [place] [address]   RE:  Robert [name], Robert [name]  MRN:  [id number]  /  DOB:  Aug 07, 1950   Dear [name]:   Robert [name] returns to the office for continued assessment and treatment  of coronary disease, now 4 years following percutaneous intervention  with a drug-eluting stent.  Cardiovascular risk factors, including  hypertension and hyperlipidemia as well as mild diabetes, have been well  controlled.  Vaccinations are up to date.   The patient is generally asymptomatic despite a reasonably active  lifestyle.  He is retired, but bowls and plays golf.  He has  intermittent chest discomfort at rest that he attributes to gas.  This  lasts a matter of minutes and resolves spontaneously.  He has  nitroglycerin, but has not used it.  He has no dyspnea, orthopnea nor  PND.   CURRENT MEDICATIONS:  1. Aspirin 81 mg daily.  2. Clopidogrel 75 mg daily.  3. Vytorin 10/40 mg daily.  4. Toprol 50 mg daily.  5. Diclofenac 75 mg b.i.d.  6. Benicar 20 mg daily.  7. Januvia 100 mg daily.   PHYSICAL EXAMINATION:  Well-appearing gentleman in no acute distress.  The weight is 209, 5 pounds more than last year.  Blood pressure 110/70,  heart rate 75 and regular, respirations 16.  HEENT:  Anicteric sclerae; normal lids and conjunctivae.  NECK:  No jugular venous distention; normal carotid upstrokes without  bruits.  ENDOCRINE:  No thyromegaly.  HEMATOPOIETIC:  No adenopathy.  SKIN:  Scattered scaly eruption, most notable over the right abdominal  wall.  The patient reports this occurs typically in the winter, and  attributes it to dry skin.  CARDIAC:  Normal first and second heart sounds; fourth heart sound  present; normal splitting of the second heart sound.  LUNGS:  Clear.  ABDOMEN:  Soft and nontender; normal bowel sounds; no bruits; no  organomegaly.  EXTREMITIES:  Normal distal pulses; no edema.   IMPRESSION:  Robert [name] is  doing generally well.  A recent lipid profile  in your office was excellent.  Electrolytes and renal function were  normal.  Glucose was 116.  Hemoglobin A1c was 7.4.  I will leave  monitoring and treatment of diabetes to your discretion.  Control of  lipids is excellent.  Control of hypertension is quite good as well.   The patient reports difficulty maintaining an erection.  We will give  him a prescription for Viagra and instructions for use.  He is cautioned  not to use nitroglycerin in the 24 hours after taking this medication.  I will plan to reassess this nice gentleman in 1 year.    Sincerely,      Gerrit Friends. Dietrich Pates, MD, [facility]  Electronically Signed    RMR/MedQ  DD: 03/08/2006  DT: 03/08/2006  Job #: [id number]

## 2010-08-21 ENCOUNTER — Other Ambulatory Visit: Payer: Self-pay | Admitting: Family Medicine

## 2010-08-21 DIAGNOSIS — R1013 Epigastric pain: Secondary | ICD-10-CM

## 2010-08-22 ENCOUNTER — Other Ambulatory Visit: Payer: Self-pay | Admitting: Family Medicine

## 2010-08-22 DIAGNOSIS — R1013 Epigastric pain: Secondary | ICD-10-CM

## 2010-08-24 ENCOUNTER — Ambulatory Visit (HOSPITAL_COMMUNITY): Payer: Managed Care, Other (non HMO)

## 2010-10-25 LAB — HEMOGLOBIN A1C: Hgb A1c MFr Bld: 7.2 % — ABNORMAL HIGH (ref <5.7)

## 2010-10-25 LAB — LIPID PANEL
Cholesterol: 136 mg/dL (ref 0–200)
HDL: 36 mg/dL — ABNORMAL LOW (ref >39)
Total CHOL/HDL Ratio: 3.8 Ratio
Triglycerides: 130 mg/dL (ref <150)

## 2010-10-25 LAB — COMPLETE METABOLIC PANEL WITH GFR: GFR, Est Non African American: >60 mL/min (ref >60)

## 2010-10-26 ENCOUNTER — Encounter: Payer: Self-pay | Admitting: Family Medicine

## 2010-10-26 ENCOUNTER — Ambulatory Visit (INDEPENDENT_AMBULATORY_CARE_PROVIDER_SITE_OTHER): Payer: Managed Care, Other (non HMO) | Admitting: Family Medicine

## 2010-10-26 VITALS — BP 120/78 | HR 86 | Resp 16 | Ht 69.0 in | Wt 226.8 lb

## 2010-10-26 DIAGNOSIS — M25519 Pain in unspecified shoulder: Secondary | ICD-10-CM

## 2010-10-26 DIAGNOSIS — E785 Hyperlipidemia, unspecified: Secondary | ICD-10-CM

## 2010-10-26 DIAGNOSIS — I2510 Atherosclerotic heart disease of native coronary artery without angina pectoris: Secondary | ICD-10-CM

## 2010-10-26 DIAGNOSIS — E1165 Type 2 diabetes mellitus with hyperglycemia: Secondary | ICD-10-CM

## 2010-10-26 DIAGNOSIS — D649 Anemia, unspecified: Secondary | ICD-10-CM

## 2010-10-26 DIAGNOSIS — I10 Essential (primary) hypertension: Secondary | ICD-10-CM

## 2010-10-26 MED ORDER — GLUCOSE BLOOD VI STRP: ORAL_STRIP | Status: AC

## 2010-10-26 MED ORDER — KETOROLAC TROMETHAMINE 60 MG/2ML IM SOLN
60.0000 mg | Freq: Once | INTRAMUSCULAR | Status: AC
  Administered 2010-10-26: 60 mg via INTRAMUSCULAR

## 2010-10-26 MED ORDER — OLMESARTAN MEDOXOMIL 20 MG PO TABS: 20.0000 mg | ORAL_TABLET | Freq: Every day | ORAL | Status: DC

## 2010-10-26 MED ORDER — SIMVASTATIN 40 MG PO TABS: 40.0000 mg | ORAL_TABLET | Freq: Every day | ORAL | Status: DC

## 2010-10-26 MED ORDER — GLIPIZIDE 5 MG PO TABS: 10.0000 mg | ORAL_TABLET | Freq: Two times a day (BID) | ORAL | Status: DC

## 2010-10-26 MED ORDER — SAXAGLIPTIN HCL 5 MG PO TABS: 5.0000 mg | ORAL_TABLET | Freq: Every day | ORAL | Status: DC

## 2010-10-26 MED ORDER — METOPROLOL SUCCINATE ER 50 MG PO TB24: 50.0000 mg | ORAL_TABLET | Freq: Every day | ORAL | Status: DC

## 2010-10-26 NOTE — Patient Instructions (Addendum)
F/U in 3.5 months.   Pls make appt with eye specialist.  Congrats on improved blood sugar.  Pls follow new eating  habits and ok to return class.  Pls commit to at leas  45 mons of exercise 6 days per week  HBA1C and chem 7 in 3.5 months CBC   PLEASE call for your annual cardiology appt

## 2010-11-03 NOTE — Assessment & Plan Note (Signed)
Improved , but still not at goal, pt recently attended diabetic ed class, and is motivated to improving this

## 2010-11-03 NOTE — Assessment & Plan Note (Signed)
Asymptomatic feels that his annual eval is due , advised to call and schedule appt if due

## 2010-11-03 NOTE — Progress Notes (Signed)
  Subjective:    Patient ID: Robert [name], male    DOB: Aug 03, 1950, 60 y.o.   MRN: [id number]  HPI The PT is here for follow up and re-evaluation of chronic medical conditions, medication management and review of any available recent lab and radiology data.  Preventive health is updated, specifically  Cancer screening and Immunization.   Questions or concerns regarding consultations or procedures which the PT has had in the interim are  addressed. The PT denies any adverse reactions to current medications since the last visit.  There are no new concerns.  There are no specific complaints    HYPERTENSION  Disease Monitoring  Blood pressure range-unknown Chest pain- no      Dyspnea- nno  Compliance- good Lightheadedness- no   Edema- no  DIABETES  Disease Monitoring  Blood Sugar ranges-fasting 120 to 135 Polyuria- no New Visual problems- no  Compliance- fair Hypoglycemic symptoms- no  Last eye exam: less than 12 months ago      Podiatry visit: yes, every 3 months approx  HYPERLIPIDEMIA  Compliance- good RUQ pain- no  Muscle aches- no   REGULAR EXERCISE-no      Review of Systems See HPI Denies recent fever or chills. Denies sinus pressure, nasal congestion, ear pain or sore throat. Denies chest congestion, productive cough or wheezing. Denies chest pains, palpitations and leg swelling Denies abdominal pain, nausea, vomiting,diarrhea or constipation.   Denies dysuria, frequency, hesitancy or incontinence. Denies joint pain, swelling and limitation in mobility. Denies headaches, seizures, numbness, or tingling. Denies depression, anxiety or insomnia. Denies skin break down or rash.        Objective:   Physical Exam Patient alert and oriented and in no cardiopulmonary distress.  HEENT: No facial asymmetry, EOMI, no sinus tenderness,  oropharynx pink and moist.  Neck supple no adenopathy.  Chest: Clear to auscultation bilaterally.  CVS: S1, S2 no murmurs,  no S3.  ABD: Soft non tender. Bowel sounds normal.  Ext: No edema  MS: Adequate ROM spine, shoulders, hips and knees.  Skin: Intact, no ulcerations or rash noted.  Psych: Good eye contact, normal affect. Memory intact not anxious or depressed appearing.  CNS: CN 2-12 intact, power, tone and sensation normal throughout.        Assessment & Plan:

## 2010-11-03 NOTE — Assessment & Plan Note (Signed)
Increase commitment to regular exercise to raise HDL encouraged, no med change

## 2010-11-03 NOTE — Assessment & Plan Note (Signed)
Controlled, no change in medication  

## 2011-01-17 ENCOUNTER — Encounter: Payer: Self-pay | Admitting: Adult Health

## 2011-01-17 ENCOUNTER — Ambulatory Visit (INDEPENDENT_AMBULATORY_CARE_PROVIDER_SITE_OTHER): Payer: Managed Care, Other (non HMO) | Admitting: Adult Health

## 2011-01-17 VITALS — BP 120/72 | HR 79 | Resp 16 | Ht 69.0 in | Wt 225.0 lb

## 2011-01-17 DIAGNOSIS — I10 Essential (primary) hypertension: Secondary | ICD-10-CM

## 2011-01-17 DIAGNOSIS — I2510 Atherosclerotic heart disease of native coronary artery without angina pectoris: Secondary | ICD-10-CM

## 2011-01-17 MED ORDER — TADALAFIL 5 MG PO TABS: 5.0000 mg | ORAL_TABLET | Freq: Every day | ORAL | Status: DC | PRN

## 2011-01-17 NOTE — Assessment & Plan Note (Signed)
Currently well controlled on medications. NO changes.

## 2011-01-17 NOTE — Patient Instructions (Signed)
Your physician has recommended you make the following change in your medication: start taking Cialis 5 mg as needed   Your physician recommends that you schedule a follow-up appointment in: 1 year

## 2011-01-17 NOTE — Assessment & Plan Note (Signed)
He is doing well from cardiac standpoint and is asymptomatic.  He is followed by Dr. Lodema Hong and is due to have labs next month for risk stratification. No changes in medications.  He will continue to exercise and try to lose some weight.

## 2011-01-17 NOTE — Progress Notes (Signed)
HPI: Robert [name] is a 60 y/o patient of Dr. Dietrich Pates we are seeing for annual visit, ongoing assessment and treatment of CAD diagnosed in 2003 with percutaneous intervention in  the first diagonal.  He has chronic total occlusion of the right coronary.  He had a stress test in 2011 was negative for ischemia. He is also followed by Dr. Lodema Hong.  He has had no admissions to the hospital or ER visits, no new diagnosis or allergies since being seen last. He continues to be active but is concerned that his weight in not going down. He attributes this to his diabetes.  He denies chest pain or shortness of breath.   Allergies  Allergen Reactions  . Rosuvastatin     REACTION: Gas    Current Outpatient Prescriptions  Medication Sig Dispense Refill  . aspirin 81 MG tablet Take 81 mg by mouth daily.        Marland Kitchen glipiZIDE (GLUCOTROL) 5 MG tablet Take 2 tablets (10 mg total) by mouth 2 (two) times daily.  360 tablet  1  . glucose blood test strip Use twice daily with One Touch Ultra 2 meter  100 each  11  . metoprolol (TOPROL-XL) 50 MG 24 hr tablet Take 1 tablet (50 mg total) by mouth daily.  90 tablet  1  . olmesartan (BENICAR) 20 MG tablet Take 1 tablet (20 mg total) by mouth daily.  90 tablet  1  . ONETOUCH DELICA LANCETS MISC Twice daily testing Dx: uncontrolled HTN  100 each  3  . saxagliptin HCl (ONGLYZA) 5 MG TABS tablet Take 1 tablet (5 mg total) by mouth daily.  90 tablet  1  . simvastatin (ZOCOR) 40 MG tablet Take 1 tablet (40 mg total) by mouth at bedtime.  90 tablet  1  . tadalafil (CIALIS) 5 MG tablet Take 1 tablet (5 mg total) by mouth daily as needed for erectile dysfunction.  10 tablet  0    Past Medical History  Diagnosis Date  . Diabetes mellitus   . Hyperlipidemia   . Hypertension   . Tobacco abuse     Past Surgical History  Procedure Date  . Rt foot surgery for ulceration 2006  . Surgical intervention for epididymymitis 1996  . Right knee surgery   . Right shoulder surgery  07/16/09    ZOX:WRUEAV of systems complete and found to be negative unless listed above PHYSICAL EXAM BP 120/72  Pulse 79  Resp 16  Ht 5\' 9"  (1.753 m)  Wt 102.059 kg (225 lb)  BMI 33.23 kg/m2 General: Well developed, well nourished, in no acute distress Head: Eyes PERRLA, No xanthomas.   Normal cephalic and atramatic  Lungs: Clear bilaterally to auscultation and percussion. Heart: HRRR S1 S2, without MRG.  Pulses are 2+ & equal.            No carotid bruit. No JVD.  No abdominal bruits. No femoral bruits. Abdomen: Bowel sounds are positive, abdomen soft and non-tender without masses or                  Hernia's noted. Msk:  Back normal, normal gait. Normal strength and tone for age. Extremities: No clubbing, cyanosis or edema.  DP +1 Neuro: Alert and oriented X 3. Psych:  Good affect, responds appropriately  EKG: NSR with Left axis deviation.  ASSESSMENT AND PLAN

## 2011-02-13 ENCOUNTER — Encounter: Payer: Self-pay | Admitting: Family Medicine

## 2011-02-14 ENCOUNTER — Ambulatory Visit (INDEPENDENT_AMBULATORY_CARE_PROVIDER_SITE_OTHER): Payer: Managed Care, Other (non HMO) | Admitting: Family Medicine

## 2011-02-14 ENCOUNTER — Telehealth: Payer: Self-pay | Admitting: Family Medicine

## 2011-02-14 ENCOUNTER — Encounter: Payer: Self-pay | Admitting: Family Medicine

## 2011-02-14 VITALS — BP 120/72 | HR 74 | Resp 16 | Ht 69.0 in | Wt 225.0 lb

## 2011-02-14 DIAGNOSIS — E785 Hyperlipidemia, unspecified: Secondary | ICD-10-CM

## 2011-02-14 DIAGNOSIS — N529 Male erectile dysfunction, unspecified: Secondary | ICD-10-CM

## 2011-02-14 DIAGNOSIS — Z23 Encounter for immunization: Secondary | ICD-10-CM

## 2011-02-14 DIAGNOSIS — R5383 Other fatigue: Secondary | ICD-10-CM

## 2011-02-14 DIAGNOSIS — Z125 Encounter for screening for malignant neoplasm of prostate: Secondary | ICD-10-CM

## 2011-02-14 DIAGNOSIS — E1165 Type 2 diabetes mellitus with hyperglycemia: Secondary | ICD-10-CM

## 2011-02-14 DIAGNOSIS — I10 Essential (primary) hypertension: Secondary | ICD-10-CM

## 2011-02-14 DIAGNOSIS — E118 Type 2 diabetes mellitus with unspecified complications: Secondary | ICD-10-CM

## 2011-02-14 DIAGNOSIS — R5381 Other malaise: Secondary | ICD-10-CM

## 2011-02-14 DIAGNOSIS — IMO0002 Reserved for concepts with insufficient information to code with codable children: Secondary | ICD-10-CM

## 2011-02-14 LAB — CBC WITH DIFFERENTIAL/PLATELET
Eosinophils Absolute: 0.2 10*3/uL (ref 0.0–0.7)
Eosinophils Relative: 5 % (ref 0–5)
Hemoglobin: 13.2 g/dL (ref 13.0–17.0)
Lymphs Abs: 1.4 10*3/uL (ref 0.7–4.0)
MCH: 28.3 pg (ref 26.0–34.0)
MCV: 90.8 fL (ref 78.0–100.0)
Monocytes Absolute: 0.5 10*3/uL (ref 0.1–1.0)
Monocytes Relative: 10 % (ref 3–12)
Platelets: 224 10*3/uL (ref 150–400)
RBC: 4.67 MIL/uL (ref 4.22–5.81)

## 2011-02-14 LAB — BASIC METABOLIC PANEL
BUN: 12 mg/dL (ref 6–23)
Chloride: 105 mEq/L (ref 96–112)
Potassium: 4.6 mEq/L (ref 3.5–5.3)

## 2011-02-14 MED ORDER — INSULIN DETEMIR 100 UNIT/ML ~~LOC~~ SOLN: SUBCUTANEOUS | Status: DC

## 2011-02-14 MED ORDER — GLIPIZIDE 5 MG PO TABS: 10.0000 mg | ORAL_TABLET | Freq: Two times a day (BID) | ORAL | Status: DC

## 2011-02-14 MED ORDER — INSULIN PEN NEEDLE 31G X 8 MM MISC: Status: DC

## 2011-02-14 MED ORDER — OLMESARTAN MEDOXOMIL 20 MG PO TABS: 20.0000 mg | ORAL_TABLET | Freq: Every day | ORAL | Status: DC

## 2011-02-14 MED ORDER — SIMVASTATIN 40 MG PO TABS: 40.0000 mg | ORAL_TABLET | Freq: Every day | ORAL | Status: DC

## 2011-02-14 MED ORDER — TADALAFIL 5 MG PO TABS: 5.0000 mg | ORAL_TABLET | Freq: Every day | ORAL | Status: DC | PRN

## 2011-02-14 MED ORDER — SAXAGLIPTIN HCL 5 MG PO TABS: 5.0000 mg | ORAL_TABLET | Freq: Every day | ORAL | Status: DC

## 2011-02-14 MED ORDER — METOPROLOL SUCCINATE ER 50 MG PO TB24: 50.0000 mg | ORAL_TABLET | Freq: Every day | ORAL | Status: DC

## 2011-02-14 NOTE — Telephone Encounter (Signed)
Sent in

## 2011-02-14 NOTE — Progress Notes (Signed)
  Subjective:    Patient ID: Robert [name], male    DOB: 06/10/1950, 60 y.o.   MRN: [id number]  HPI The PT is here for follow up and re-evaluation of chronic medical conditions, medication management and review of any available recent lab and radiology data.  Preventive health is updated, specifically  Cancer screening and Immunization.   Questions or concerns regarding consultations or procedures which the PT has had in the interim are  addressed. The PT denies any adverse reactions to current medications since the last visit.  Still reports fasting sugars between 140 to 150, and not really committed to exercise, states a lot of family illness with inc responsibilities on his part     Review of Systems See HPI Denies recent fever or chills. Denies sinus pressure, nasal congestion, ear pain or sore throat. Denies chest congestion, productive cough or wheezing. Denies chest pains, palpitations and leg swelling Denies abdominal pain, nausea, vomiting,diarrhea or constipation.   Denies dysuria, frequency, hesitancy or incontinence. Shoulder pain and reduced mobility Denies headaches, seizures, numbness, or tingling. Denies depression, anxiety or insomnia. Denies skin break down or rash.        Objective:   Physical Exam Patient alert and oriented and in no cardiopulmonary distress.  HEENT: No facial asymmetry, EOMI, no sinus tenderness,  oropharynx pink and moist.  Neck supple no adenopathy.  Chest: Clear to auscultation bilaterally.  CVS: S1, S2 no murmurs, no S3.  ABD: Soft non tender. Bowel sounds normal.  Ext: No edema  MS: Adequate ROM spine,hips and knees.Reduced in right shoulder  Skin: Intact, no ulcerations or rash noted.  Psych: Good eye contact, normal affect. Memory intact not anxious or depressed appearing.  CNS: CN 2-12 intact, power, tone and sensation normal throughout.        Assessment & Plan:

## 2011-02-14 NOTE — Patient Instructions (Addendum)
F/u in 3 months.  HBa1C and CMP, EGFR, lipids fasting 3 months, just before  before visit.  New additional med for blood sugar , inject once daily at same time before breakfast, long acting insulin.  Goal for fasting blood sugar ranges from 80 to 120 and 2 hours after any meal or at bedtime should be between 130 to 170.   It is important that you exercise regularly at least 60 minutes 5 times a week. If you develop chest pain, have severe difficulty breathing, or feel very tired, stop exercising immediately and seek medical attention    TdaP today and flu vaccine

## 2011-02-19 NOTE — Assessment & Plan Note (Signed)
Controlled, no change in medication Hyperlipidemia:Low fat diet discussed and encouraged.  \ 

## 2011-02-19 NOTE — Assessment & Plan Note (Signed)
Controlled, no change in medication  

## 2011-02-19 NOTE — Assessment & Plan Note (Signed)
Difficulty attaining and maintaining erections, cialis prescribed

## 2011-02-19 NOTE — Assessment & Plan Note (Signed)
Uncontrolled insulin added

## 2011-05-18 ENCOUNTER — Ambulatory Visit (INDEPENDENT_AMBULATORY_CARE_PROVIDER_SITE_OTHER): Payer: Managed Care, Other (non HMO) | Admitting: Family Medicine

## 2011-05-18 ENCOUNTER — Encounter: Payer: Self-pay | Admitting: Family Medicine

## 2011-05-18 VITALS — BP 140/90 | HR 69 | Resp 16 | Ht 69.0 in | Wt 221.0 lb

## 2011-05-18 DIAGNOSIS — IMO0001 Reserved for inherently not codable concepts without codable children: Secondary | ICD-10-CM

## 2011-05-18 DIAGNOSIS — I10 Essential (primary) hypertension: Secondary | ICD-10-CM

## 2011-05-18 DIAGNOSIS — E118 Type 2 diabetes mellitus with unspecified complications: Secondary | ICD-10-CM

## 2011-05-18 DIAGNOSIS — E785 Hyperlipidemia, unspecified: Secondary | ICD-10-CM

## 2011-05-18 DIAGNOSIS — E1165 Type 2 diabetes mellitus with hyperglycemia: Secondary | ICD-10-CM

## 2011-05-18 DIAGNOSIS — IMO0002 Reserved for concepts with insufficient information to code with codable children: Secondary | ICD-10-CM

## 2011-05-18 DIAGNOSIS — N529 Male erectile dysfunction, unspecified: Secondary | ICD-10-CM

## 2011-05-18 LAB — BASIC METABOLIC PANEL
BUN: 15 mg/dL (ref 6–23)
Creat: 1.3 mg/dL (ref 0.50–1.35)
Potassium: 4.1 mEq/L (ref 3.5–5.3)

## 2011-05-18 LAB — HEMOGLOBIN A1C
Hgb A1c MFr Bld: 7.2 % — ABNORMAL HIGH (ref <5.7)
Mean Plasma Glucose: 160 mg/dL — ABNORMAL HIGH (ref <117)

## 2011-05-18 MED ORDER — SAXAGLIPTIN HCL 5 MG PO TABS: 5.0000 mg | ORAL_TABLET | Freq: Every day | ORAL | Status: DC

## 2011-05-18 MED ORDER — OLMESARTAN MEDOXOMIL 20 MG PO TABS: 20.0000 mg | ORAL_TABLET | Freq: Every day | ORAL | Status: DC

## 2011-05-18 MED ORDER — SIMVASTATIN 40 MG PO TABS: 40.0000 mg | ORAL_TABLET | Freq: Every day | ORAL | Status: DC

## 2011-05-18 MED ORDER — GLIPIZIDE 5 MG PO TABS: 10.0000 mg | ORAL_TABLET | Freq: Two times a day (BID) | ORAL | Status: DC

## 2011-05-18 MED ORDER — METOPROLOL SUCCINATE ER 50 MG PO TB24: 50.0000 mg | ORAL_TABLET | Freq: Every day | ORAL | Status: DC

## 2011-05-18 NOTE — Patient Instructions (Signed)
F/u in 4 month  Congrats on improved blood sugars Increase your insulin dose to 10 units at bedtime.  Please change snacks to fresh vegetables, eg carrots, tomatoes, or fruits, eg pears, apples  It is important that you exercise regularly at least 30 minutes 5 times a week. If you develop chest pain, have severe difficulty breathing, or feel very tired, stop exercising immediately and seek medical attention   pls cut back on canned foods and salt, blood pressure is high  You have lost 8 pounds since April, keep it up!  A healthy diet is rich in fruit, vegetables and whole grains. Poultry fish, nuts and beans are a healthy choice for protein rather then red meat. A low sodium diet and drinking 64 ounces of water daily is generally recommended. Oils and sweet should be limited. Carbohydrates especially for those who are diabetic or overweight, should be limited to 30-45 gram per meal. It is important to eat on a regular schedule, at least 3 times daily. Snacks should be primarily fruits, vegetables or nuts.  HBa1C, and microalb, in 4 months, not fasting

## 2011-05-19 NOTE — Assessment & Plan Note (Signed)
Elevated at this visit, no med change

## 2011-05-19 NOTE — Progress Notes (Signed)
Addended by: Abner Greenspan on: 05/19/2011 04:36 PM   Modules accepted: Orders

## 2011-05-19 NOTE — Assessment & Plan Note (Signed)
Hyperlipidemia:Low fat diet discussed and encouraged.  U[pdated lab at next visit 

## 2011-05-19 NOTE — Assessment & Plan Note (Signed)
Improved, inc insulin dose

## 2011-05-19 NOTE — Progress Notes (Signed)
  Subjective:    Patient ID: Robert [name], male    DOB: [birth year], 61 y.o.   MRN: [id number]  HPI The PT is here for follow up and re-evaluation of chronic medical conditions, medication management and review of any available recent lab and radiology data.  Preventive health is updated, specifically  Cancer screening and Immunization.   Questions or concerns regarding consultations or procedures which the PT has had in the interim are  addressed. The PT denies any adverse reactions to current medications since the last visit.  TV accidentally fell on his right great toes several weeks ago, isbeing followed by podiatry, and is on antibiotic Improved blood sugars, esp when he was exercising, unfortunately unable to do so now. Eye exam due in the Summer Fasting blood sugars range from 130 to 150    Review of Systems See HPI Denies recent fever or chills. Denies sinus pressure, nasal congestion, ear pain or sore throat. Denies chest congestion, productive cough or wheezing. Denies chest pains, palpitations and leg swelling Denies abdominal pain, nausea, vomiting,diarrhea or constipation.   Denies dysuria, frequency, hesitancy or incontinence.  Denies headaches, seizures, numbness, or tingling. Denies depression, anxiety or insomnia. Denies skin break down or rash.        Objective:   Physical Exam  Patient alert and oriented and in no cardiopulmonary distress.  HEENT: No facial asymmetry, EOMI, no sinus tenderness,  oropharynx pink and moist.  Neck supple no adenopathy.  Chest: Clear to auscultation bilaterally.  CVS: S1, S2 no murmurs, no S3.  ABD: Soft non tender. Bowel sounds normal.  Ext: No edema  MS: Adequate ROM spine, shoulders, hips and knees.Subungual hemorhage under right great toe  Skin: Intact, no ulcerations or rash noted.  Psych: Good eye contact, normal affect. Memory intact not anxious or depressed appearing.  CNS: CN 2-12 intact, power, tone and  sensation normal throughout. Diabetic Foot Check:  Appearance -bruising and erythema on right great toe Skin - no unusual pallor or redness Sensation - grossly intact to light touch Monofilament testing -  Right - Great toe, medial, central, lateral ball and posterior foot intact Left - Great toe, medial, central, lateral ball and posterior foot intact Pulses Left - Dorsalis Pedis and Posterior Tibia normal Right - Dorsalis Pedis and Posterior Tibia normal       Assessment & Plan:

## 2011-05-19 NOTE — Assessment & Plan Note (Signed)
cialis 5mg  tablet effective

## 2011-09-18 LAB — COMPLETE METABOLIC PANEL WITH GFR
ALT: 15 U/L (ref 0–53)
AST: 15 U/L (ref 0–37)
Albumin: 3.9 g/dL (ref 3.5–5.2)
CO2: 26 mEq/L (ref 19–32)
Calcium: 8.8 mg/dL (ref 8.4–10.5)
Chloride: 106 mEq/L (ref 96–112)
Creat: 1.17 mg/dL (ref 0.50–1.35)
GFR, Est African American: 78 mL/min
Potassium: 4.5 mEq/L (ref 3.5–5.3)
Sodium: 142 mEq/L (ref 135–145)
Total Protein: 6.6 g/dL (ref 6.0–8.3)

## 2011-09-18 LAB — LIPID PANEL
Cholesterol: 128 mg/dL (ref 0–200)
Total CHOL/HDL Ratio: 3.9 Ratio

## 2011-09-18 LAB — HEMOGLOBIN A1C: Mean Plasma Glucose: 157 mg/dL — ABNORMAL HIGH (ref <117)

## 2011-09-19 ENCOUNTER — Ambulatory Visit (INDEPENDENT_AMBULATORY_CARE_PROVIDER_SITE_OTHER): Payer: Managed Care, Other (non HMO) | Admitting: Family Medicine

## 2011-09-19 ENCOUNTER — Encounter: Payer: Self-pay | Admitting: Family Medicine

## 2011-09-19 VITALS — BP 130/90 | HR 69 | Resp 18 | Ht 69.0 in | Wt 225.1 lb

## 2011-09-19 DIAGNOSIS — E785 Hyperlipidemia, unspecified: Secondary | ICD-10-CM

## 2011-09-19 DIAGNOSIS — I10 Essential (primary) hypertension: Secondary | ICD-10-CM

## 2011-09-19 DIAGNOSIS — E1165 Type 2 diabetes mellitus with hyperglycemia: Secondary | ICD-10-CM

## 2011-09-19 LAB — MICROALBUMIN / CREATININE URINE RATIO
Microalb Creat Ratio: 5.3 mg/g (ref 0.0–30.0)
Microalb, Ur: 2.15 mg/dL — ABNORMAL HIGH (ref 0.00–1.89)

## 2011-09-19 MED ORDER — INSULIN DETEMIR 100 UNIT/ML ~~LOC~~ SOLN: 15.0000 [IU] | Freq: Every day | SUBCUTANEOUS | Status: DC

## 2011-09-19 MED ORDER — OLMESARTAN MEDOXOMIL-HCTZ 20-12.5 MG PO TABS: 1.0000 | ORAL_TABLET | Freq: Every day | ORAL | Status: DC

## 2011-09-19 NOTE — Patient Instructions (Addendum)
F/u in 2 month  Blood pressure is high, stop benicar 20mg  , start benicar/hctz 20/12.5 one daily  Increase levemir to 15 units every morning.  Good cholesterol is low, commit to 30 minutes moderate walking as able every day please, cholesterol is otherwise excellent  Please increase the amount of colored foods, vegetables, that you eat, weigh loss goal of 4 to 5 pounds in the next 2 month

## 2011-09-19 NOTE — Progress Notes (Signed)
  Subjective:    Patient ID: Robert [name], male    DOB: [birth year], 61 y.o.   MRN: [id number]  HPI The PT is here for follow up and re-evaluation of chronic medical conditions, medication management and review of any available recent lab and radiology data.  Preventive health is updated, specifically  Cancer screening and Immunization.   Questions or concerns regarding consultations or procedures which the PT has had in the interim are  addressed. The PT denies any adverse reactions to current medications since the last visit.  There are no new concerns.  There are no specific complaints       Review of Systems See HPI Denies recent fever or chills. Denies sinus pressure, nasal congestion, ear pain or sore throat. Denies chest congestion, productive cough or wheezing. Denies chest pains, palpitations and leg swelling Denies abdominal pain, nausea, vomiting,diarrhea or constipation.   Denies dysuria, frequency, hesitancy or incontinence. Denies joint pain, swelling and limitation in mobility. Denies headaches, seizures, numbness, or tingling. Denies depression, anxiety or insomnia. Denies skin break down or rash.        Objective:   Physical Exam Patient alert and oriented and in no cardiopulmonary distress.  HEENT: No facial asymmetry, EOMI, no sinus tenderness,  oropharynx pink and moist.  Neck supple no adenopathy.  Chest: Clear to auscultation bilaterally.  CVS: S1, S2 no murmurs, no S3.  ABD: Soft non tender. Bowel sounds normal.  Ext: No edema  MS: Adequate ROM spine, shoulders, hips and knees.  Skin: Intact, no ulcerations or rash noted.  Psych: Good eye contact, normal affect. Memory intact not anxious or depressed appearing.  CNS: CN 2-12 intact, power, tone and sensation normal throughout. Diabetic Foot Check:  Appearance - no lesions, ulcers or calluses Skin - no unusual pallor or redness Sensation - grossly intact to light touch Monofilament  testing -  Right - Great toe, medial, central, lateral ball and posterior foot intact Left - Great toe, medial, central, lateral ball and posterior foot intact Pulses Left - Dorsalis Pedis and Posterior Tibia normal Right - Dorsalis Pedis and Posterior Tibia normal        Assessment & Plan:

## 2011-10-03 NOTE — Assessment & Plan Note (Signed)
Improved though still sub optimal control, dose increase in levimir

## 2011-10-03 NOTE — Assessment & Plan Note (Addendum)
Hyperlipidemia:Low fat diet discussed and encouraged.  Controlled, no change in medication, since LDL is low pt needs to commit to regular exercise

## 2011-10-03 NOTE — Assessment & Plan Note (Signed)
Uncontrolled dose increase in med

## 2011-12-01 ENCOUNTER — Ambulatory Visit: Payer: Managed Care, Other (non HMO) | Admitting: Family Medicine

## 2011-12-18 ENCOUNTER — Ambulatory Visit (INDEPENDENT_AMBULATORY_CARE_PROVIDER_SITE_OTHER): Payer: Managed Care, Other (non HMO) | Admitting: Family Medicine

## 2011-12-18 ENCOUNTER — Encounter: Payer: Self-pay | Admitting: Family Medicine

## 2011-12-18 VITALS — BP 120/82 | HR 80 | Resp 18 | Ht 69.0 in | Wt 226.0 lb

## 2011-12-18 DIAGNOSIS — Z2911 Encounter for prophylactic immunotherapy for respiratory syncytial virus (RSV): Secondary | ICD-10-CM

## 2011-12-18 DIAGNOSIS — I10 Essential (primary) hypertension: Secondary | ICD-10-CM

## 2011-12-18 DIAGNOSIS — E669 Obesity, unspecified: Secondary | ICD-10-CM

## 2011-12-18 DIAGNOSIS — E118 Type 2 diabetes mellitus with unspecified complications: Secondary | ICD-10-CM

## 2011-12-18 DIAGNOSIS — IMO0002 Reserved for concepts with insufficient information to code with codable children: Secondary | ICD-10-CM

## 2011-12-18 DIAGNOSIS — E785 Hyperlipidemia, unspecified: Secondary | ICD-10-CM

## 2011-12-18 DIAGNOSIS — Z23 Encounter for immunization: Secondary | ICD-10-CM

## 2011-12-18 MED ORDER — INSULIN DETEMIR 100 UNIT/ML ~~LOC~~ SOLN: 30.0000 [IU] | Freq: Every day | SUBCUTANEOUS | Status: DC

## 2011-12-18 MED ORDER — GLIPIZIDE 10 MG PO TABS: 10.0000 mg | ORAL_TABLET | Freq: Two times a day (BID) | ORAL | Status: DC

## 2011-12-18 NOTE — Patient Instructions (Addendum)
F/u in early December.  Blood pressure is excellent  You NEED to test blood sugar at least oNCE daily, before breakfast.  Stop onglyza once you finish current script.  Increase levemir to 20 units daily once you stop onglyza. After 2 weeks if average fasting sugar stays over 130 then increase to 25 units, then if needed after an additional 2 weeks , increase to 30 units  It is important that you exercise regularly at least 30 minutes 5 times a week. If you develop chest pain, have severe difficulty breathing, or feel very tired, stop exercising immediately and seek medical attention  TDaP , flu vaccine and zostavax today  HBA1C and chem 7 today

## 2011-12-24 DIAGNOSIS — E669 Obesity, unspecified: Secondary | ICD-10-CM | POA: Insufficient documentation

## 2011-12-24 DIAGNOSIS — E663 Overweight: Secondary | ICD-10-CM | POA: Insufficient documentation

## 2011-12-24 NOTE — Progress Notes (Signed)
  Subjective:    Patient ID: Robert [name], male    DOB: [birth year], 61 y.o.   MRN: [id number]  HPI The PT is here for follow up and re-evaluation of chronic medical conditions,espescially IDDM, medication management and review of any available recent lab and radiology data.  Preventive health is updated, specifically  Cancer screening and Immunization.   Questions or concerns regarding consultations or procedures which the PT has had in the interim are  addressed. The PT denies any adverse reactions to current medications since the last visit.  There are no new concerns.  There are no specific complaints  Blood sugar log shows marked variation in values , pt readily agrees that he is inconsistent with his diet, and still not comited to daily exercise      Review of Systems See HPI Denies recent fever or chills. Denies sinus pressure, nasal congestion, ear pain or sore throat. Denies chest congestion, productive cough or wheezing. Denies chest pains, palpitations and leg swelling Denies abdominal pain, nausea, vomiting,diarrhea or constipation.   Denies dysuria, frequency, hesitancy or incontinence. Denies joint pain, swelling and limitation in mobility. Denies headaches, seizures, numbness, or tingling. Denies depression, anxiety or insomnia. Denies skin break down or rash.        Objective:   Physical Exam Patient alert and oriented and in no cardiopulmonary distress.  HEENT: No facial asymmetry, EOMI, no sinus tenderness,  oropharynx pink and moist.  Neck supple no adenopathy.  Chest: Clear to auscultation bilaterally.  CVS: S1, S2 no murmurs, no S3.  ABD: Soft non tender. Bowel sounds normal.  Ext: No edema  MS: Adequate ROM spine, shoulders, hips and knees.  Skin: Intact, no ulcerations or rash noted.  Psych: Good eye contact, normal affect. Memory intact not anxious or depressed appearing.  CNS: CN 2-12 intact, power, tone and sensation normal  throughout. Diabetic Foot Check:  Appearance - no lesions, ulcers or calluses Skin - no unusual pallor or redness Sensation - grossly intact to light touch Monofilament testing -  Right - Great toe, medial, central, lateral ball and posterior foot intact Left - Great toe, medial, central, lateral ball and posterior foot intact Pulses Left - Dorsalis Pedis and Posterior Tibia normal Right - Dorsalis Pedis and Posterior Tibia normal        Assessment & Plan:

## 2011-12-24 NOTE — Assessment & Plan Note (Signed)
Controlled, no change in medication Hyperlipidemia:Low fat diet discussed and encouraged.  \ 

## 2011-12-24 NOTE — Assessment & Plan Note (Signed)
Controlled, no change in medication DASH diet and commitment to daily physical activity for a minimum of 30 minutes discussed and encouraged, as a part of hypertension management. The importance of attaining a healthy weight is also discussed.  

## 2011-12-24 NOTE — Assessment & Plan Note (Signed)
Sub optimal control, pt to transition off of onglyza, and increase levemir dose

## 2011-12-24 NOTE — Assessment & Plan Note (Signed)
Unchanged. Patient re-educated about  the importance of commitment to a  minimum of 150 minutes of exercise per week. The importance of healthy food choices with portion control discussed. Encouraged to start a food diary, count calories and to consider  joining a support group. Sample diet sheets offered. Goals set by the patient for the next several months.    

## 2012-01-22 ENCOUNTER — Ambulatory Visit: Payer: Managed Care, Other (non HMO) | Admitting: Cardiology

## 2012-02-12 ENCOUNTER — Ambulatory Visit (INDEPENDENT_AMBULATORY_CARE_PROVIDER_SITE_OTHER): Payer: Managed Care, Other (non HMO) | Admitting: Cardiology

## 2012-02-12 ENCOUNTER — Encounter: Payer: Self-pay | Admitting: Cardiology

## 2012-02-12 VITALS — BP 120/74 | HR 76 | Ht 69.0 in | Wt 227.0 lb

## 2012-02-12 DIAGNOSIS — E785 Hyperlipidemia, unspecified: Secondary | ICD-10-CM | POA: Insufficient documentation

## 2012-02-12 DIAGNOSIS — E1122 Type 2 diabetes mellitus with diabetic chronic kidney disease: Secondary | ICD-10-CM | POA: Insufficient documentation

## 2012-02-12 DIAGNOSIS — I2510 Atherosclerotic heart disease of native coronary artery without angina pectoris: Secondary | ICD-10-CM

## 2012-02-12 DIAGNOSIS — D649 Anemia, unspecified: Secondary | ICD-10-CM

## 2012-02-12 DIAGNOSIS — I10 Essential (primary) hypertension: Secondary | ICD-10-CM

## 2012-02-12 DIAGNOSIS — I129 Hypertensive chronic kidney disease with stage 1 through stage 4 chronic kidney disease, or unspecified chronic kidney disease: Secondary | ICD-10-CM

## 2012-02-12 DIAGNOSIS — N182 Chronic kidney disease, stage 2 (mild): Secondary | ICD-10-CM

## 2012-02-12 DIAGNOSIS — E119 Type 2 diabetes mellitus without complications: Secondary | ICD-10-CM

## 2012-02-12 NOTE — Assessment & Plan Note (Signed)
Blood pressure has been excellent, both in the office and when assessed at home by the patient.  Current medication will be continued.

## 2012-02-12 NOTE — Assessment & Plan Note (Signed)
Hemoglobin A1c level is near optimal.  Dr. Lodema Hong will continue to manage this problem.

## 2012-02-12 NOTE — Progress Notes (Deleted)
Name: Robert [name]    DOB: 11/16/1950  Age: 61 y.o.  MR#: [id number]       PCP:  Syliva Overman, MD      Insurance: @PAYORNAME @    MEDICATION LIST NO RECENT LAB OF INTEREST  CC:    Chief Complaint  Patient presents with  . Follow-up    occassional chest pain periodically    VS BP 120/74  Pulse 76  Ht 5\' 9"  (1.753 m)  Wt 227 lb 0.6 oz (102.985 kg)  BMI 33.53 kg/m2  SpO2 97%  Weights Current Weight  02/12/12 227 lb 0.6 oz (102.985 kg)  12/18/11 226 lb 0.6 oz (102.531 kg)  09/19/11 225 lb 1.3 oz (102.096 kg)    Blood Pressure  BP Readings from Last 3 Encounters:  02/12/12 120/74  12/18/11 120/82  09/19/11 130/90     Admit date:  (Not on file) Last encounter with RMR:  Visit date not found   Allergy Allergies  Allergen Reactions  . Rosuvastatin Other (See Comments)    Adverse GI symptoms    Current Outpatient Prescriptions  Medication Sig Dispense Refill  . aspirin 81 MG tablet Take 81 mg by mouth daily.        Marland Kitchen glipiZIDE (GLUCOTROL) 10 MG tablet Take 1 tablet (10 mg total) by mouth 2 (two) times daily.  180 tablet  3  . insulin detemir (LEVEMIR) 100 UNIT/ML injection Inject 25 Units into the skin at bedtime.      . Insulin Pen Needle (B-D ULTRAFINE III SHORT PEN) 31G X 8 MM MISC Use as directed with levemir flexpen  50 each  11  . metoprolol succinate (TOPROL-XL) 50 MG 24 hr tablet Take 1 tablet (50 mg total) by mouth daily.  90 tablet  1  . olmesartan-hydrochlorothiazide (BENICAR HCT) 20-12.5 MG per tablet Take 1 tablet by mouth daily.  90 tablet  3  . ONETOUCH DELICA LANCETS MISC Twice daily testing Dx: uncontrolled HTN  100 each  3  . simvastatin (ZOCOR) 40 MG tablet Take 1 tablet (40 mg total) by mouth at bedtime.  90 tablet  1  . tadalafil (CIALIS) 5 MG tablet Take 5 mg by mouth daily as needed.      . [DISCONTINUED] insulin detemir (LEVEMIR FLEXPEN) 100 UNIT/ML injection Inject 30 Units into the skin at bedtime.  45 mL  12    Discontinued Meds:      Medications Discontinued During This Encounter  Medication Reason  . insulin detemir (LEVEMIR FLEXPEN) 100 UNIT/ML injection     Patient Active Problem List  Diagnosis  . Diabetes mellitus, type 2  . ANEMIA  . ATHEROSCLEROTIC CARDIOVASCULAR DISEASE  . TOBACCO ABUSE, HX OF  . ED (erectile dysfunction)  . Obesity (BMI 30.0-34.9)  . Hypertension  . Hyperlipidemia    LABS No visits with results within 3 Month(s) from this visit. Latest known visit with results is:  Office Visit on 05/18/2011  Component Date Value  . Hemoglobin A1C 09/18/2011 7.1*  . Mean Plasma Glucose 09/18/2011 157*  . Microalb, Ur 09/18/2011 2.15*  . Creatinine, Urine 09/18/2011 401.9   . Microalb Creat Ratio 09/18/2011 5.3   . Cholesterol 09/18/2011 128   . Triglycerides 09/18/2011 130   . HDL 09/18/2011 33*  . Total CHOL/HDL Ratio 09/18/2011 3.9   . VLDL 09/18/2011 26   . LDL Cholesterol 09/18/2011 69   . Sodium 09/18/2011 142   . Potassium 09/18/2011 4.5   . Chloride 09/18/2011 106   .  CO2 09/18/2011 26   . Glucose, Bld 09/18/2011 133*  . BUN 09/18/2011 14   . Creat 09/18/2011 1.17   . Total Bilirubin 09/18/2011 0.4   . Alkaline Phosphatase 09/18/2011 92   . AST 09/18/2011 15   . ALT 09/18/2011 15   . Total Protein 09/18/2011 6.6   . Albumin 09/18/2011 3.9   . Calcium 09/18/2011 8.8   . GFR, Est African American 09/18/2011 78   . GFR, Est Non African Ame* 09/18/2011 68      Results for this Opt Visit:     Results for orders placed in visit on 05/18/11  HEMOGLOBIN A1C      Component Value Range   Hemoglobin A1C 7.1 (*) <5.7 %   Mean Plasma Glucose 157 (*) <117 mg/dL  MICROALBUMIN / CREATININE URINE RATIO      Component Value Range   Microalb, Ur 2.15 (*) 0.00 - 1.89 mg/dL   Creatinine, Urine 782.9     Microalb Creat Ratio 5.3  0.0 - 30.0 mg/g  LIPID PANEL      Component Value Range   Cholesterol 128  0 - 200 mg/dL   Triglycerides 562  <130 mg/dL   HDL 33 (*) >86 mg/dL   Total  CHOL/HDL Ratio 3.9     VLDL 26  0 - 40 mg/dL   LDL Cholesterol 69  0 - 99 mg/dL  COMPLETE METABOLIC PANEL WITH GFR      Component Value Range   Sodium 142  135 - 145 mEq/L   Potassium 4.5  3.5 - 5.3 mEq/L   Chloride 106  96 - 112 mEq/L   CO2 26  19 - 32 mEq/L   Glucose, Bld 133 (*) 70 - 99 mg/dL   BUN 14  6 - 23 mg/dL   Creat 5.78  4.69 - 6.29 mg/dL   Total Bilirubin 0.4  0.3 - 1.2 mg/dL   Alkaline Phosphatase 92  39 - 117 U/L   AST 15  0 - 37 U/L   ALT 15  0 - 53 U/L   Total Protein 6.6  6.0 - 8.3 g/dL   Albumin 3.9  3.5 - 5.2 g/dL   Calcium 8.8  8.4 - 52.8 mg/dL   GFR, Est African American 78     GFR, Est Non African American 68      EKG Orders placed in visit on 01/17/11  . EKG 12-LEAD     Prior Assessment and Plan Problem List as of 02/12/2012            Cardiology Problems   ATHEROSCLEROTIC CARDIOVASCULAR DISEASE   Last Assessment & Plan Note   01/17/2011 Office Visit Signed 01/17/2011 12:21 PM by Jodelle Gross, NP    He is doing well from cardiac standpoint and is asymptomatic.  He is followed by Dr. Lodema Hong and is due to have labs next month for risk stratification. No changes in medications.  He will continue to exercise and try to lose some weight.    Hypertension   Hyperlipidemia     Other   Diabetes mellitus, type 2   Last Assessment & Plan Note   12/18/2011 Office Visit Signed 12/24/2011  2:12 PM by Kerri Perches, MD    Sub optimal control, pt to transition off of onglyza, and increase levemir dose    ANEMIA   TOBACCO ABUSE, HX OF   ED (erectile dysfunction)   Last Assessment & Plan Note   05/18/2011 Office Visit Signed 05/19/2011  6:27 AM by Kerri Perches, MD    cialis 5mg  tablet effective    Obesity (BMI 30.0-34.9)   Last Assessment & Plan Note   12/18/2011 Office Visit Signed 12/24/2011  2:14 PM by Kerri Perches, MD    Unchanged. Patient re-educated about  the importance of commitment to a  minimum of 150 minutes of exercise per  week. The importance of healthy food choices with portion control discussed. Encouraged to start a food diary, count calories and to consider  joining a support group. Sample diet sheets offered. Goals set by the patient for the next several months.           Imaging: No results found.   FRS Calculation: Score not calculated. Missing: Total Cholesterol

## 2012-02-12 NOTE — Assessment & Plan Note (Signed)
Excellent lipid profile a few months ago; current therapy will be continued.

## 2012-02-12 NOTE — Progress Notes (Signed)
Patient ID: Robert [name], male   DOB: 08-18-1950, 61 y.o.   MRN: [id number]  HPI: Scheduled return visit for this nice gentleman with coronary artery disease, hypertension and hyperlipidemia.  Patient has done well since his last office visit with no significant medical problems and no need for urgent medical evaluation.  He occasionally notes sharp left-sided chest discomfort that is migratory and transient, lasting less than 30 seconds.  There is no associated dyspnea nor diaphoresis.  Prior to Admission medications   Medication Sig Start Date End Date Taking? Authorizing Provider  aspirin 81 MG tablet Take 81 mg by mouth daily.     Yes Historical Provider, MD  glipiZIDE (GLUCOTROL) 10 MG tablet Take 1 tablet (10 mg total) by mouth 2 (two) times daily. 12/18/11 12/17/12 Yes Kerri Perches, MD  insulin detemir (LEVEMIR) 100 UNIT/ML injection Inject 25 Units into the skin at bedtime. 12/18/11  Yes Kerri Perches, MD  Insulin Pen Needle (B-D ULTRAFINE III SHORT PEN) 31G X 8 MM MISC Use as directed with levemir flexpen 02/14/11  Yes Kerri Perches, MD  metoprolol succinate (TOPROL-XL) 50 MG 24 hr tablet Take 1 tablet (50 mg total) by mouth daily. 05/18/11  Yes Kerri Perches, MD  olmesartan-hydrochlorothiazide (BENICAR HCT) 20-12.5 MG per tablet Take 1 tablet by mouth daily. 09/19/11 09/18/12 Yes Kerri Perches, MD  [facility] DELICA LANCETS MISC Twice daily testing Dx: uncontrolled HTN 07/27/10  Yes Kerri Perches, MD  simvastatin (ZOCOR) 40 MG tablet Take 1 tablet (40 mg total) by mouth at bedtime. 05/18/11  Yes Kerri Perches, MD  tadalafil (CIALIS) 5 MG tablet Take 5 mg by mouth daily as needed. 02/14/11  Yes Kerri Perches, MD   Allergies  Allergen Reactions  . Rosuvastatin Other (See Comments)    Adverse GI symptoms     Past medical history, social history, and family history reviewed and updated.  ROS: Denies orthopnea, PND, pedal edema, palpitations,  lightheadedness or syncope.  Remains fairly active with no exertional dyspnea.  He experiences muscle cramps in his right hand that are moderately painful.  PHYSICAL EXAM: BP 120/74  Pulse 76  Ht 5\' 9"  (1.753 m)  Wt 102.985 kg (227 lb 0.6 oz)  BMI 33.53 kg/m2  SpO2 97%  General-Well developed; no acute distress Body habitus-Overweight Neck-No JVD; no carotid bruits Lungs-clear lung fields; resonant to percussion Cardiovascular-normal PMI; normal S1 and S2; S4 present Abdomen-normal bowel sounds; soft and non-tender without masses or organomegaly Musculoskeletal-No deformities, no cyanosis or clubbing Neurologic-Normal cranial nerves; symmetric strength and tone Skin-Warm, no significant lesions Extremities-distal pulses intact; no edema  ASSESSMENT AND PLAN:  [place] Bing, MD 02/12/2012 4:27 PM

## 2012-02-12 NOTE — Patient Instructions (Addendum)
Your physician recommends that you schedule a follow-up appointment in: 1 year follow up  

## 2012-02-12 NOTE — Assessment & Plan Note (Addendum)
Patient has had no clinical issues with coronary disease since 2003 and continues to do well.  We will focus on the optimal management of cardiovascular risk factors.

## 2012-02-12 NOTE — Assessment & Plan Note (Signed)
March recent CBC available to me was performed in November of 2012 and was normal.  In 2009 and 2010, CBC showed minimal anemia with hemoglobin of approximately 12.4.  This probably was not a significant problem and appears to have resolved spontaneously.

## 2012-03-03 ENCOUNTER — Other Ambulatory Visit: Payer: Self-pay | Admitting: Family Medicine

## 2012-03-04 ENCOUNTER — Other Ambulatory Visit: Payer: Self-pay

## 2012-03-04 MED ORDER — SIMVASTATIN 40 MG PO TABS: 40.0000 mg | ORAL_TABLET | Freq: Every day | ORAL | Status: DC

## 2012-03-04 MED ORDER — METOPROLOL SUCCINATE ER 50 MG PO TB24: 50.0000 mg | ORAL_TABLET | Freq: Every day | ORAL | Status: DC

## 2012-03-29 ENCOUNTER — Other Ambulatory Visit: Payer: Self-pay | Admitting: Family Medicine

## 2012-03-30 LAB — BASIC METABOLIC PANEL
BUN: 22 mg/dL (ref 6–23)
Calcium: 9.7 mg/dL (ref 8.4–10.5)
Creat: 1.56 mg/dL — ABNORMAL HIGH (ref 0.50–1.35)
Glucose, Bld: 119 mg/dL — ABNORMAL HIGH (ref 70–99)
Potassium: 4.6 mEq/L (ref 3.5–5.3)

## 2012-03-30 LAB — HEMOGLOBIN A1C
Hgb A1c MFr Bld: 8.1 % — ABNORMAL HIGH (ref <5.7)
Mean Plasma Glucose: 186 mg/dL — ABNORMAL HIGH (ref <117)

## 2012-04-01 ENCOUNTER — Ambulatory Visit (INDEPENDENT_AMBULATORY_CARE_PROVIDER_SITE_OTHER): Payer: Managed Care, Other (non HMO) | Admitting: Family Medicine

## 2012-04-01 ENCOUNTER — Encounter: Payer: Self-pay | Admitting: Family Medicine

## 2012-04-01 VITALS — BP 110/80 | HR 79 | Resp 16 | Ht 69.0 in | Wt 227.4 lb

## 2012-04-01 DIAGNOSIS — E119 Type 2 diabetes mellitus without complications: Secondary | ICD-10-CM

## 2012-04-01 DIAGNOSIS — E669 Obesity, unspecified: Secondary | ICD-10-CM

## 2012-04-01 DIAGNOSIS — E66811 Obesity, class 1: Secondary | ICD-10-CM

## 2012-04-01 DIAGNOSIS — I10 Essential (primary) hypertension: Secondary | ICD-10-CM

## 2012-04-01 DIAGNOSIS — E785 Hyperlipidemia, unspecified: Secondary | ICD-10-CM

## 2012-04-01 DIAGNOSIS — D649 Anemia, unspecified: Secondary | ICD-10-CM

## 2012-04-01 DIAGNOSIS — Z125 Encounter for screening for malignant neoplasm of prostate: Secondary | ICD-10-CM

## 2012-04-01 DIAGNOSIS — R252 Cramp and spasm: Secondary | ICD-10-CM

## 2012-04-01 MED ORDER — INSULIN DETEMIR 100 UNIT/ML ~~LOC~~ SOLN: 50.0000 [IU] | Freq: Every day | SUBCUTANEOUS | Status: DC

## 2012-04-01 MED ORDER — TADALAFIL 5 MG PO TABS: 5.0000 mg | ORAL_TABLET | Freq: Every day | ORAL | Status: DC | PRN

## 2012-04-01 MED ORDER — INSULIN PEN NEEDLE 31G X 8 MM MISC: Status: DC

## 2012-04-01 NOTE — Progress Notes (Signed)
  Subjective:    Patient ID: Robert [name], male    DOB: February 29, 1951, 61 y.o.   MRN: [id number]  HPI The PT is here for follow up and re-evaluation of chronic medical conditions, medication management and review of any available recent lab and radiology data.  Preventive health is updated, specifically  Cancer screening and Immunization.   Questions or concerns regarding consultations or procedures which the PT has had in the interim are  Addressed.Has recently seen cardiology, no new concerns with CAD The PT states his new blood pressure med is causing cramps, occurs once every 1 to 2 weeks, with carpal spasm.States cialis 5mg  tab of no benefit for Ed I advised the dose is 10 or 20mg  and to use 2 or 4 tablets , max twice weekly for sexual activity Not testing blood sugars regularly, notes them to be higher than desired when he does, no regular exercise and diet is "out of control" with the holidays reportedly  Needs foot surgery, I advise against this until his blood sugars are controlled      Review of Systems See HPI Denies recent fever or chills. Denies sinus pressure, nasal congestion, ear pain or sore throat. Denies chest congestion, productive cough or wheezing. Denies chest pains, palpitations and leg swelling Denies abdominal pain, nausea, vomiting,diarrhea or constipation.   Denies dysuria, frequency, hesitancy or incontinence. Denies joint pain, swelling and limitation in mobility. Denies headaches, seizures, numbness, or tingling. Denies depression, anxiety or insomnia. Denies skin break down or rash.        Objective:   Physical Exam  Patient alert and oriented and in no cardiopulmonary distress.  HEENT: No facial asymmetry, EOMI, no sinus tenderness,  oropharynx pink and moist.  Neck supple no adenopathy.  Chest: Clear to auscultation bilaterally.  CVS: S1, S2 no murmurs, no S3.  ABD: Soft non tender. Bowel sounds normal.  Ext: No edema  MS: Adequate ROM  spine, shoulders, hips and knees.  Skin: Intact, no ulcerations or rash noted.  Psych: Good eye contact, normal affect. Memory intact not anxious or depressed appearing.  CNS: CN 2-12 intact, power, tone and sensation normal throughout.       Assessment & Plan:

## 2012-04-01 NOTE — Patient Instructions (Addendum)
F/u in 12 weeks.Call with questions or if you need me before.  Please go back to diabetic ed class since blood sugar is out of control  You need to test blood sugars twice daily for the next 12 weeks. Fasting sugar needs to be between 80 to 130  Bedtime , or 2 hours after any meal, the blood sugar needs to be 130 to 180  Start levemir 40 units  Daily, every week increase by 5 units to a max of 50 units if the blood sugar is still uncontrolled  Try small amount of mustard for cramps  Additional labs will be added, please check them in my chart, pSA, lipid, hepatic , EGFR, TSH, magnesium and calcium  HBA1C and chem 7 non fasting in 12 weeks  Please commit to daily exercise for up 30 minutes  Use cialis 5mg  tabs two or four, as needed , for erectile function, maximum twice weekly

## 2012-04-02 LAB — HEPATIC FUNCTION PANEL
Albumin: 4.2 g/dL (ref 3.5–5.2)
Total Bilirubin: 0.3 mg/dL (ref 0.3–1.2)
Total Protein: 7.4 g/dL (ref 6.0–8.3)

## 2012-04-02 LAB — CALCIUM: Calcium: 9.7 mg/dL (ref 8.4–10.5)

## 2012-04-02 LAB — LIPID PANEL
HDL: 35 mg/dL — ABNORMAL LOW (ref >39)
LDL Cholesterol: 50 mg/dL (ref 0–99)
Total CHOL/HDL Ratio: 4.5 Ratio

## 2012-04-02 NOTE — Assessment & Plan Note (Addendum)
Unchanged. Patient re-educated about  the importance of commitment to a  minimum of 150 minutes of exercise per week. The importance of healthy food choices with portion control discussed. Encouraged to start a food diary, count calories and to consider  joining a support group. Sample diet sheets offered. Goals set by the patient for the next several months.    

## 2012-04-02 NOTE — Assessment & Plan Note (Signed)
Uncontrolled, marked elevatio in triglycerides likely due to dietary indiscretion. No med change Rept with next labs, pt to be counseled re need to reduce cheese, butter , fried foods and red meat, also needs to commit to daily exercise to increase HDL

## 2012-04-02 NOTE — Assessment & Plan Note (Signed)
Controlled, no change in medication DASH diet and commitment to daily physical activity for a minimum of 30 minutes discussed and encouraged, as a part of hypertension management. The importance of attaining a healthy weight is also discussed.  

## 2012-04-02 NOTE — Assessment & Plan Note (Signed)
Uncontrolled and deteriorated. Non compliant with treatment plan as far as diet and physical activity are concerned Counseled re the importance of lifestyle change to improve health for approx 10 minutes also up titrated the levemir. He is to call back if blood sugars remain elevated

## 2012-06-19 ENCOUNTER — Other Ambulatory Visit: Payer: Self-pay | Admitting: Family Medicine

## 2012-06-21 LAB — BASIC METABOLIC PANEL
Calcium: 9.2 mg/dL (ref 8.4–10.5)
Glucose, Bld: 130 mg/dL — ABNORMAL HIGH (ref 70–99)
Sodium: 137 mEq/L (ref 135–145)

## 2012-06-21 LAB — HEMOGLOBIN A1C
Hgb A1c MFr Bld: 7.2 % — ABNORMAL HIGH (ref <5.7)
Mean Plasma Glucose: 160 mg/dL — ABNORMAL HIGH (ref <117)

## 2012-06-24 ENCOUNTER — Ambulatory Visit (INDEPENDENT_AMBULATORY_CARE_PROVIDER_SITE_OTHER): Payer: Managed Care, Other (non HMO) | Admitting: Family Medicine

## 2012-06-24 ENCOUNTER — Ambulatory Visit (HOSPITAL_COMMUNITY)
Admission: RE | Admit: 2012-06-24 | Discharge: 2012-06-24 | Disposition: A | Discharge status: Home / Self Care | Payer: Managed Care, Other (non HMO) | Source: Ambulatory Visit | Attending: Family Medicine | Admitting: Family Medicine

## 2012-06-24 VITALS — BP 102/74 | HR 82 | Resp 16 | Ht 69.0 in | Wt 222.0 lb

## 2012-06-24 DIAGNOSIS — E669 Obesity, unspecified: Secondary | ICD-10-CM

## 2012-06-24 DIAGNOSIS — IMO0001 Reserved for inherently not codable concepts without codable children: Secondary | ICD-10-CM

## 2012-06-24 DIAGNOSIS — R5381 Other malaise: Secondary | ICD-10-CM

## 2012-06-24 DIAGNOSIS — M25579 Pain in unspecified ankle and joints of unspecified foot: Secondary | ICD-10-CM | POA: Insufficient documentation

## 2012-06-24 DIAGNOSIS — E785 Hyperlipidemia, unspecified: Secondary | ICD-10-CM

## 2012-06-24 DIAGNOSIS — I10 Essential (primary) hypertension: Secondary | ICD-10-CM

## 2012-06-24 DIAGNOSIS — E66811 Obesity, class 1: Secondary | ICD-10-CM

## 2012-06-24 DIAGNOSIS — E1065 Type 1 diabetes mellitus with hyperglycemia: Secondary | ICD-10-CM

## 2012-06-24 DIAGNOSIS — D649 Anemia, unspecified: Secondary | ICD-10-CM

## 2012-06-24 DIAGNOSIS — R5383 Other fatigue: Secondary | ICD-10-CM

## 2012-06-24 MED ORDER — OLMESARTAN MEDOXOMIL 20 MG PO TABS: 20.0000 mg | ORAL_TABLET | Freq: Every day | ORAL | Status: DC

## 2012-06-24 NOTE — Patient Instructions (Addendum)
F/u in 3.5 month, please call if you need me before  Eye exam when due  Congrats on improved blood sugars, keep it up  Walk 3 miles daily, and cut back carbs slightly.  Continue to test twice daily.  Blood pressure low, new medication, benicar 20mg  one daily You will get a coupon for free1 month if we hAVE SINCE YOU JUST GOT A NEW SCRIPT  CBC, fasting lipid, cmp and EGFR, HBA1C, and microalb in 3.5 month, bEFORE visit

## 2012-06-24 NOTE — Assessment & Plan Note (Addendum)
Over corrected and symptomatic, change med, discontinue hctz componenent

## 2012-07-01 ENCOUNTER — Encounter: Payer: Self-pay | Admitting: Family Medicine

## 2012-07-01 NOTE — Assessment & Plan Note (Signed)
Marked improvement, pt congratulated on this. No med change Patient advised to reduce carb and sweets, commit to regular physical activity, take meds as prescribed, test blood as directed, and attempt to lose weight, to improve blood sugar control.

## 2012-07-01 NOTE — Assessment & Plan Note (Signed)
Increased ankle pain , no recent traum, xray ordered

## 2012-07-01 NOTE — Assessment & Plan Note (Signed)
Improved. Pt applauded on succesful weight loss through lifestyle change, and encouraged to continue same. Weight loss goal set for the next several months.  

## 2012-07-01 NOTE — Progress Notes (Signed)
  Subjective:    Patient ID: Robert [name], male    DOB: [birth year], 62 y.o.   MRN: [id number]  HPI The PT is here for follow up and re-evaluation of chronic medical conditions, medication management and review of any available recent lab and radiology data.  Preventive health is updated, specifically  Cancer screening and Immunization.   Questions or concerns regarding consultations or procedures which the PT has had in the interim are  addressed. C/o light headedness when he takes blood pressure meds, has to sit for a while worse in the past approx 6 weeks. Now exercisng and has lost weight Improved blood sugars, no hypoglycemia or blurred vision C/o bilateral ankle pain with no recent trauma     Review of Systems See HPI Denies recent fever or chills. Denies sinus pressure, nasal congestion, ear pain or sore throat. Denies chest congestion, productive cough or wheezing. Denies chest pains, palpitations and leg swelling Denies abdominal pain, nausea, vomiting,diarrhea or constipation.   Denies dysuria, frequency, hesitancy or incontinence.  Denies headaches, seizures, numbness, or tingling. Denies depression, anxiety or insomnia. Denies skin break down or rash.        Objective:   Physical Exam Patient alert and oriented and in no cardiopulmonary distress.  HEENT: No facial asymmetry, EOMI, no sinus tenderness,  oropharynx pink and moist.  Neck supple no adenopathy.  Chest: Clear to auscultation bilaterally.  CVS: S1, S2 no murmurs, no S3.  ABD: Soft non tender. Bowel sounds normal.  Ext: No edema  MS: Adequate ROM spine, shoulders, hips and knees.decreased in ankles  Skin: Intact, no ulcerations or rash noted.  Psych: Good eye contact, normal affect. Memory intact not anxious or depressed appearing.  CNS: CN 2-12 intact, power, tone and sensation normal throughout.        Assessment & Plan:

## 2012-07-01 NOTE — Assessment & Plan Note (Signed)
Hyperlipidemia:Low fat diet discussed and encouraged.  Updated lab for next visit 

## 2012-08-27 ENCOUNTER — Other Ambulatory Visit: Payer: Self-pay | Admitting: Family Medicine

## 2012-10-22 LAB — COMPLETE METABOLIC PANEL WITH GFR
ALT: 16 U/L (ref 0–53)
AST: 17 U/L (ref 0–37)
Alkaline Phosphatase: 110 U/L (ref 39–117)
CO2: 26 mEq/L (ref 19–32)
Sodium: 139 mEq/L (ref 135–145)
Total Bilirubin: 0.6 mg/dL (ref 0.3–1.2)
Total Protein: 7.1 g/dL (ref 6.0–8.3)

## 2012-10-22 LAB — CBC WITH DIFFERENTIAL/PLATELET
Basophils Absolute: 0 10*3/uL (ref 0.0–0.1)
Eosinophils Relative: 5 % (ref 0–5)
HCT: 41.5 % (ref 39.0–52.0)
Lymphocytes Relative: 41 % (ref 12–46)
MCV: 84.5 fL (ref 78.0–100.0)
Monocytes Absolute: 0.4 10*3/uL (ref 0.1–1.0)
Monocytes Relative: 10 % (ref 3–12)
RDW: 15.6 % — ABNORMAL HIGH (ref 11.5–15.5)
WBC: 4.4 10*3/uL (ref 4.0–10.5)

## 2012-10-22 LAB — HEMOGLOBIN A1C: Mean Plasma Glucose: 151 mg/dL — ABNORMAL HIGH (ref <117)

## 2012-10-22 LAB — LIPID PANEL
HDL: 38 mg/dL — ABNORMAL LOW (ref >39)
LDL Cholesterol: 65 mg/dL (ref 0–99)
Total CHOL/HDL Ratio: 3.5 Ratio
VLDL: 30 mg/dL (ref 0–40)

## 2012-10-23 LAB — MICROALBUMIN / CREATININE URINE RATIO: Microalb, Ur: 1.66 mg/dL (ref 0.00–1.89)

## 2012-10-24 ENCOUNTER — Ambulatory Visit (INDEPENDENT_AMBULATORY_CARE_PROVIDER_SITE_OTHER): Payer: Managed Care, Other (non HMO) | Admitting: Family Medicine

## 2012-10-24 ENCOUNTER — Encounter: Payer: Self-pay | Admitting: Family Medicine

## 2012-10-24 VITALS — BP 140/80 | HR 76 | Resp 16 | Ht 69.0 in | Wt 220.0 lb

## 2012-10-24 DIAGNOSIS — E669 Obesity, unspecified: Secondary | ICD-10-CM

## 2012-10-24 DIAGNOSIS — D239 Other benign neoplasm of skin, unspecified: Secondary | ICD-10-CM

## 2012-10-24 DIAGNOSIS — E109 Type 1 diabetes mellitus without complications: Secondary | ICD-10-CM

## 2012-10-24 DIAGNOSIS — E785 Hyperlipidemia, unspecified: Secondary | ICD-10-CM

## 2012-10-24 DIAGNOSIS — E1065 Type 1 diabetes mellitus with hyperglycemia: Secondary | ICD-10-CM

## 2012-10-24 DIAGNOSIS — D229 Melanocytic nevi, unspecified: Secondary | ICD-10-CM | POA: Insufficient documentation

## 2012-10-24 DIAGNOSIS — M25562 Pain in left knee: Secondary | ICD-10-CM

## 2012-10-24 DIAGNOSIS — M25569 Pain in unspecified knee: Secondary | ICD-10-CM

## 2012-10-24 DIAGNOSIS — I10 Essential (primary) hypertension: Secondary | ICD-10-CM

## 2012-10-24 MED ORDER — PREDNISONE 5 MG PO TABS: 5.0000 mg | ORAL_TABLET | Freq: Two times a day (BID) | ORAL | Status: AC

## 2012-10-24 MED ORDER — METOPROLOL SUCCINATE ER 50 MG PO TB24: ORAL_TABLET | ORAL | Status: DC

## 2012-10-24 MED ORDER — SIMVASTATIN 40 MG PO TABS: ORAL_TABLET | ORAL | Status: DC

## 2012-10-24 NOTE — Patient Instructions (Addendum)
F/u in 4 month, call if you need me before  CONGRATS on great improvement in your health, keep all the good habits up!  Weight loss goal of 8 to 10 pounds Commit to walking or the equivalent of 3 miles at least 5 days per week, this will improve your good cholesterol Pls cut back on butter and cheese and red meat and fried foods, triglycerides are slightly high  Fasting lipid, cmp and EGFR and HBA1C in 4 month, before visit  Call for yearly f/u with cardiology for November appt  I recommend orthopedic evaluation for left knee pain and instability, call for referral when you decide, prednisone for 5 days sent to local pharmacy  You are referred to dermatology re mole on forehead  Call in October for flu vaccine

## 2012-10-25 DIAGNOSIS — IMO0001 Reserved for inherently not codable concepts without codable children: Secondary | ICD-10-CM

## 2012-10-25 HISTORY — DX: Reserved for inherently not codable concepts without codable children: IMO0001

## 2012-10-25 NOTE — Assessment & Plan Note (Signed)
Controlled, no change in medication DASH diet and commitment to daily physical activity for a minimum of 30 minutes discussed and encouraged, as a part of hypertension management. The importance of attaining a healthy weight is also discussed.  

## 2012-10-25 NOTE — Assessment & Plan Note (Signed)
Marked improvement. Pt applauded on this. No med change .

## 2012-10-25 NOTE — Progress Notes (Signed)
  Subjective:    Patient ID: Robert [name], male    DOB: 01/29/1951, 62 y.o.   MRN: [id number]  HPI The PT is here for follow up and re-evaluation of chronic medical conditions, medication management and review of any available recent lab and radiology data.  Preventive health is updated, specifically  Cancer screening and Immunization.   Questions or concerns regarding consultations or procedures which the PT has had in the interim are  Addressed.Podiatrist requesting x ry reports of feet based on pt's c/o bilateral foot pain which he states has improved somewhat though left knee still bothersome The PT denies any adverse reactions to current medications since the last visit.  C/o mole over right eyebrow which is recurrent and itches, wants this removed Has comited to regular exercise and cut back on intake with a 12 pound weight loss since December and marked improvement in his blood sugar      Review of Systems See HPI Denies recent fever or chills. Denies sinus pressure, nasal congestion, ear pain or sore throat. Denies chest congestion, productive cough or wheezing. Denies chest pains, palpitations and leg swelling Denies abdominal pain, nausea, vomiting,diarrhea or constipation.   Denies dysuria, frequency, hesitancy or incontinence.  Denies headaches, seizures, numbness, or tingling. Denies depression, anxiety or insomnia.         Objective:   Physical Exam  Patient alert and oriented and in no cardiopulmonary distress.  HEENT: No facial asymmetry, EOMI, no sinus tenderness,  oropharynx pink and moist.  Neck supple no adenopathy.  Chest: Clear to auscultation bilaterally.  CVS: S1, S2 no murmurs, no S3.  ABD: Soft non tender. Bowel sounds normal.  Ext: No edema  MS: Adequate ROM spine, shoulders, hips and reduced in left  Knee with tenderness and crepitus.  Skin: Intact, no ulcerations or rash noted.Mole over right eyebrow  Psych: Good eye contact, normal  affect. Memory intact not anxious or depressed appearing.  CNS: CN 2-12 intact, power, tone and sensation normal throughout.       Assessment & Plan:

## 2012-10-25 NOTE — Assessment & Plan Note (Signed)
Controlled, no change in medication Pt to increase exercise to improve HDL

## 2012-10-25 NOTE — Assessment & Plan Note (Signed)
Detriorated with instability. Pt does not want ortho eval at this time He is aware of increased fall risk Short course of prednisone

## 2012-10-25 NOTE — Assessment & Plan Note (Signed)
Deteriorated. Patient re-educated about  the importance of commitment to a  minimum of 150 minutes of exercise per week. The importance of healthy food choices with portion control discussed. Encouraged to start a food diary, count calories and to consider  joining a support group. Sample diet sheets offered. Goals set by the patient for the next several months.    

## 2012-10-25 NOTE — Assessment & Plan Note (Signed)
Recurrent pruritic lesion over right eyebrow, derm to eval and treat/remove

## 2013-01-04 ENCOUNTER — Other Ambulatory Visit: Payer: Self-pay | Admitting: Family Medicine

## 2013-01-10 ENCOUNTER — Encounter (INDEPENDENT_AMBULATORY_CARE_PROVIDER_SITE_OTHER): Payer: Self-pay

## 2013-01-10 ENCOUNTER — Ambulatory Visit (INDEPENDENT_AMBULATORY_CARE_PROVIDER_SITE_OTHER): Payer: Managed Care, Other (non HMO)

## 2013-01-10 DIAGNOSIS — Z23 Encounter for immunization: Secondary | ICD-10-CM

## 2013-02-26 ENCOUNTER — Ambulatory Visit (INDEPENDENT_AMBULATORY_CARE_PROVIDER_SITE_OTHER): Payer: Managed Care, Other (non HMO) | Admitting: Cardiology

## 2013-02-26 ENCOUNTER — Encounter: Payer: Self-pay | Admitting: Cardiology

## 2013-02-26 VITALS — BP 125/72 | HR 82 | Ht 69.0 in | Wt 225.0 lb

## 2013-02-26 DIAGNOSIS — E785 Hyperlipidemia, unspecified: Secondary | ICD-10-CM

## 2013-02-26 DIAGNOSIS — I2510 Atherosclerotic heart disease of native coronary artery without angina pectoris: Secondary | ICD-10-CM

## 2013-02-26 DIAGNOSIS — I10 Essential (primary) hypertension: Secondary | ICD-10-CM

## 2013-02-26 NOTE — Progress Notes (Signed)
Clinical Summary Mr. Pat is a 62 y.o.male former patient of Dr Dietrich Pates, this is our first visit together. He was seen for the following medical problems.  1. CAD - prior stents as described below - denies any chest pain. No SOB. Walks 3 miles daily without any troubles - compliant with meds: ASA, Toprol XL, olmesartan, simva 40  2. HTN - does not check regularly - compliant meds  3. Hyperlipidemia - compliant with simva - 10/2012: TC 133 TG 150 HDL 38 LDL 65  Past Medical History  Diagnosis Date  . Diabetes mellitus, type II   . Hypertension     Lab  09/2011: Normal CMet ex G-133  . Tobacco abuse, in remission     30 pack years; quit in 1985  . Arteriosclerotic cardiovascular disease (ASCVD)     DES to LAD D1 - 2003 in [place]; failed intervention for a totally obstructed RCA at that time; EF of 45%; 12/2009: Equivocal stress nuclear with good exercise tolerance, negative stress EKG, normal EF with mild mid and distal inferior ischemia  . Hyperlipidemia     Lipid profile in 09/2011:128, 130, 33, 69     Allergies  Allergen Reactions  . Rosuvastatin Other (See Comments)    Adverse GI symptoms     Current Outpatient Prescriptions  Medication Sig Dispense Refill  . aspirin 81 MG tablet Take 81 mg by mouth daily.        . insulin detemir (LEVEMIR FLEXPEN) 100 UNIT/ML injection Inject 50 Units into the skin daily.  15 mL  12  . Insulin Pen Needle (B-D ULTRAFINE III SHORT PEN) 31G X 8 MM MISC Use as directed with levemir flexpen  50 each  11  . metoprolol succinate (TOPROL-XL) 50 MG 24 hr tablet TAKE 1 TABLET DAILY WITH OR IMMEDIATELY FOLLOWING A MEAL  90 tablet  0  . olmesartan (BENICAR) 20 MG tablet Take 1 tablet (20 mg total) by mouth daily.  90 tablet  3  . ONETOUCH DELICA LANCETS MISC Twice daily testing Dx: uncontrolled HTN  100 each  3  . simvastatin (ZOCOR) 40 MG tablet TAKE 1 TABLET AT BEDTIME  90 tablet  0  . tadalafil (CIALIS) 5 MG tablet Take 1 tablet (5 mg  total) by mouth daily as needed.  10 tablet  3   No current facility-administered medications for this visit.     Past Surgical History  Procedure Laterality Date  . Pressure ulcer debridement  2006    Right lower extremity  . Epididymis surgery  1996  . Knee arthroscopy w/ meniscectomy  11/2008    Right  . Rotator cuff repair  07/2009    Right  . Colonoscopy  2010    Negative screening study     Allergies  Allergen Reactions  . Rosuvastatin Other (See Comments)    Adverse GI symptoms      Family History  Problem Relation Age of Onset  . Diabetes Sister   . Arthritis Brother   . Diabetes Brother   . Cancer Father     Hypernephroma     Social History Mr. Phagan reports that he quit smoking about 29 years ago. He does not have any smokeless tobacco history on file. Mr. Garraway reports that he does not drink alcohol.   Review of Systems CONSTITUTIONAL: No weight loss, fever, chills, weakness or fatigue.  HEENT: Eyes: No visual loss, blurred vision, double vision or yellow sclerae.No hearing loss, sneezing, congestion, runny nose  or sore throat.  SKIN: No rash or itching.  CARDIOVASCULAR: per HPI RESPIRATORY: per HPI  GASTROINTESTINAL: No anorexia, nausea, vomiting or diarrhea. No abdominal pain or blood.  GENITOURINARY: No burning on urination, no polyuria NEUROLOGICAL: No headache, dizziness, syncope, paralysis, ataxia, numbness or tingling in the extremities. No change in bowel or bladder control.  MUSCULOSKELETAL: No muscle, back pain, joint pain or stiffness.  LYMPHATICS: No enlarged nodes. No history of splenectomy.  PSYCHIATRIC: No history of depression or anxiety.  ENDOCRINOLOGIC: No reports of sweating, cold or heat intolerance. No polyuria or polydipsia.  Marland Kitchen   Physical Examination p 82 bp 125/72 Wt 225 lbs BMI 33 Gen: resting comfortably, no acute distress HEENT: no scleral icterus, pupils equal round and reactive, no palptable cervical adenopathy,    CV: RRR, no m/r/g, no JVD, no carotid bruits Resp: Clear to auscultation bilaterally GI: abdomen is soft, non-tender, non-distended, normal bowel sounds, no hepatosplenomegaly MSK: extremities are warm, no edema.  Skin: warm, no rash Psych: appropriate affect     Assessment and Plan  1. CAD - no current symptoms, continue secondary prevention and risk factor modification  2. HTN - at goal, continue current meds  3. Hyperlipidemia - at goal, continue current statin    Follow up 1 year  Antoine Poche, M.D., F.A.C.C.

## 2013-02-26 NOTE — Patient Instructions (Addendum)
Your physician wants you to follow-up in: ONE YEAR You will receive a reminder letter in the mail two months in advance. If you don't receive a letter, please call our office to schedule the follow-up appointment.  

## 2013-03-04 LAB — COMPLETE METABOLIC PANEL WITH GFR
Albumin: 3.8 g/dL (ref 3.5–5.2)
Alkaline Phosphatase: 97 U/L (ref 39–117)
BUN: 12 mg/dL (ref 6–23)
CO2: 26 mEq/L (ref 19–32)
GFR, Est African American: 70 mL/min
GFR, Est Non African American: 61 mL/min
Glucose, Bld: 119 mg/dL — ABNORMAL HIGH (ref 70–99)
Potassium: 4.3 mEq/L (ref 3.5–5.3)

## 2013-03-04 LAB — LIPID PANEL
HDL: 36 mg/dL — ABNORMAL LOW (ref >39)
LDL Cholesterol: 73 mg/dL (ref 0–99)
Triglycerides: 207 mg/dL — ABNORMAL HIGH (ref <150)
VLDL: 41 mg/dL — ABNORMAL HIGH (ref 0–40)

## 2013-03-06 ENCOUNTER — Encounter (INDEPENDENT_AMBULATORY_CARE_PROVIDER_SITE_OTHER): Payer: Self-pay

## 2013-03-06 ENCOUNTER — Encounter: Payer: Self-pay | Admitting: Family Medicine

## 2013-03-06 ENCOUNTER — Ambulatory Visit (INDEPENDENT_AMBULATORY_CARE_PROVIDER_SITE_OTHER): Payer: Managed Care, Other (non HMO) | Admitting: Family Medicine

## 2013-03-06 VITALS — BP 120/78 | HR 75 | Resp 16 | Wt 227.0 lb

## 2013-03-06 DIAGNOSIS — D649 Anemia, unspecified: Secondary | ICD-10-CM

## 2013-03-06 DIAGNOSIS — E669 Obesity, unspecified: Secondary | ICD-10-CM

## 2013-03-06 DIAGNOSIS — Z125 Encounter for screening for malignant neoplasm of prostate: Secondary | ICD-10-CM

## 2013-03-06 DIAGNOSIS — E109 Type 1 diabetes mellitus without complications: Secondary | ICD-10-CM

## 2013-03-06 DIAGNOSIS — I10 Essential (primary) hypertension: Secondary | ICD-10-CM

## 2013-03-06 DIAGNOSIS — E785 Hyperlipidemia, unspecified: Secondary | ICD-10-CM

## 2013-03-06 NOTE — Patient Instructions (Signed)
F/U in 4 month, call if you need me before  Fasting lipid, cmp and EGFr, HBA1C, TSH and PSA  In 4 month  Triglycerides and blood sugar have both increased , also you have gained 7 pounds , please work on this  It is important that you exercise regularly at least 30 minutes 5 times a week. If you develop chest pain, have severe difficulty breathing, or feel very tired, stop exercising immediately and seek medical attention   A healthy diet is rich in fruit, vegetables and whole grains. Poultry fish, nuts and beans are a healthy choice for protein rather then red meat. A low sodium diet and drinking 64 ounces of water daily is generally recommended. Oils and sweet should be limited. Carbohydrates especially for those who are diabetic or overweight, should be limited to 45 to 60 gram per meal. It is important to eat on a regular schedule, at least 3 times daily. Snacks should be primarily fruits, vegetables or nuts.

## 2013-03-06 NOTE — Progress Notes (Signed)
   Subjective:    Patient ID: Robert [name], male    DOB: 01/26/1951, 62 y.o.   MRN: [id number]  HPI The PT is here for follow up and re-evaluation of chronic medical conditions, medication management and review of any available recent lab and radiology data.  Preventive health is updated, specifically  Cancer screening and Immunization.   Questions or concerns regarding consultations or procedures which the PT has had in the interim are  addressed. The PT denies any adverse reactions to current medications since the last visit.  There are no new concerns.  There are no specific complaints  States FBG average between 140 to 150, denies polyuria or polydipsia, he is very aware of the effect of diet on bG values Has consistently walked for 30 to 45 mins for 5 days per week and feels better. Ongoing foot pain being checked by podiatrist     Review of Systems See HPI Denies recent fever or chills. Denies sinus pressure, nasal congestion, ear pain or sore throat. Denies chest congestion, productive cough or wheezing. Denies chest pains, palpitations and leg swelling Denies abdominal pain, nausea, vomiting,diarrhea or constipation.   Denies dysuria, frequency, hesitancy or incontinence. Denies joint pain, swelling and limitation in mobility. Denies headaches, seizures, numbness, or tingling. Denies depression, anxiety or insomnia. Denies skin break down or rash.        Objective:   Physical Exam  Patient alert and oriented and in no cardiopulmonary distress.  HEENT: No facial asymmetry, EOMI, no sinus tenderness,  oropharynx pink and moist.  Neck supple no adenopathy.  Chest: Clear to auscultation bilaterally.  CVS: S1, S2 no murmurs, no S3.  ABD: Soft non tender. Bowel sounds normal.  Ext: No edema  MS: Adequate ROM spine, shoulders, hips and knees.  Skin: Intact, no ulcerations or rash noted.  Psych: Good eye contact, normal affect. Memory intact not anxious or  depressed appearing.  CNS: CN 2-12 intact, power, tone and sensation normal throughout.       Assessment & Plan:

## 2013-03-09 NOTE — Assessment & Plan Note (Signed)
Controlled, no change in medication DASH diet and commitment to daily physical activity for a minimum of 30 minutes discussed and encouraged, as a part of hypertension management. The importance of attaining a healthy weight is also discussed.  

## 2013-03-09 NOTE — Assessment & Plan Note (Signed)
Deteriorated. Patient re-educated about  the importance of commitment to a  minimum of 150 minutes of exercise per week. The importance of healthy food choices with portion control discussed. Encouraged to start a food diary, count calories and to consider  joining a support group. Sample diet sheets offered. Goals set by the patient for the next several months.    

## 2013-03-09 NOTE — Assessment & Plan Note (Signed)
Deteriorated, with weight gain, and dietary indiscretion , pt to work on this to improve control Patient advised to reduce carb and sweets, commit to regular physical activity, take meds as prescribed, test blood as directed, and attempt to lose weight, to improve blood sugar control.

## 2013-03-09 NOTE — Assessment & Plan Note (Signed)
Elevated triglycerides,cussed and encouraged. No med change

## 2013-03-12 ENCOUNTER — Other Ambulatory Visit: Payer: Self-pay

## 2013-03-12 MED ORDER — GLIPIZIDE 10 MG PO TABS: 10.0000 mg | ORAL_TABLET | Freq: Two times a day (BID) | ORAL | Status: DC

## 2013-03-12 MED ORDER — TADALAFIL 5 MG PO TABS: 5.0000 mg | ORAL_TABLET | Freq: Every day | ORAL | Status: DC | PRN

## 2013-03-12 MED ORDER — METOPROLOL SUCCINATE ER 50 MG PO TB24: ORAL_TABLET | ORAL | Status: DC

## 2013-03-12 MED ORDER — SIMVASTATIN 40 MG PO TABS: ORAL_TABLET | ORAL | Status: DC

## 2013-03-12 MED ORDER — BETAMETHASONE VALERATE 0.1 % EX CREA: 1.0000 "application " | TOPICAL_CREAM | Freq: Every day | CUTANEOUS | Status: DC

## 2013-04-19 ENCOUNTER — Other Ambulatory Visit: Payer: Self-pay | Admitting: Family Medicine

## 2013-05-09 ENCOUNTER — Other Ambulatory Visit: Payer: Self-pay | Admitting: Family Medicine

## 2013-06-19 ENCOUNTER — Other Ambulatory Visit: Payer: Self-pay | Admitting: Family Medicine

## 2013-07-07 LAB — COMPLETE METABOLIC PANEL WITH GFR
ALT: 22 U/L (ref 0–53)
AST: 20 U/L (ref 0–37)
Albumin: 3.8 g/dL (ref 3.5–5.2)
Alkaline Phosphatase: 100 U/L (ref 39–117)
BUN: 14 mg/dL (ref 6–23)
CO2: 25 mEq/L (ref 19–32)
Calcium: 8.6 mg/dL (ref 8.4–10.5)
Chloride: 107 mEq/L (ref 96–112)
Creat: 1.21 mg/dL (ref 0.50–1.35)
GFR, Est African American: 74 mL/min
GFR, Est Non African American: 64 mL/min
Glucose, Bld: 133 mg/dL — ABNORMAL HIGH (ref 70–99)
Potassium: 4.2 mEq/L (ref 3.5–5.3)
Sodium: 141 mEq/L (ref 135–145)
Total Bilirubin: 0.3 mg/dL (ref 0.2–1.2)
Total Protein: 6.4 g/dL (ref 6.0–8.3)

## 2013-07-07 LAB — HEMOGLOBIN A1C
Hgb A1c MFr Bld: 7.2 % — ABNORMAL HIGH (ref <5.7)
Mean Plasma Glucose: 160 mg/dL — ABNORMAL HIGH (ref <117)

## 2013-07-07 LAB — TSH: TSH: 1.543 u[IU]/mL (ref 0.350–4.500)

## 2013-07-07 LAB — LIPID PANEL
Cholesterol: 121 mg/dL (ref 0–200)
HDL: 35 mg/dL — ABNORMAL LOW (ref >39)
LDL Cholesterol: 62 mg/dL (ref 0–99)
Total CHOL/HDL Ratio: 3.5 Ratio
Triglycerides: 120 mg/dL (ref <150)
VLDL: 24 mg/dL (ref 0–40)

## 2013-07-08 ENCOUNTER — Encounter (INDEPENDENT_AMBULATORY_CARE_PROVIDER_SITE_OTHER): Payer: Self-pay

## 2013-07-08 ENCOUNTER — Encounter: Payer: Self-pay | Admitting: Family Medicine

## 2013-07-08 ENCOUNTER — Ambulatory Visit (HOSPITAL_COMMUNITY)
Admission: RE | Admit: 2013-07-08 | Discharge: 2013-07-08 | Disposition: A | Discharge status: Home / Self Care | Payer: BC Managed Care – PPO | Source: Ambulatory Visit | Attending: Family Medicine | Admitting: Family Medicine

## 2013-07-08 ENCOUNTER — Ambulatory Visit (INDEPENDENT_AMBULATORY_CARE_PROVIDER_SITE_OTHER): Payer: BC Managed Care – PPO | Admitting: Family Medicine

## 2013-07-08 VITALS — BP 118/74 | HR 74 | Resp 16 | Wt 223.8 lb

## 2013-07-08 DIAGNOSIS — E109 Type 1 diabetes mellitus without complications: Secondary | ICD-10-CM

## 2013-07-08 DIAGNOSIS — M47817 Spondylosis without myelopathy or radiculopathy, lumbosacral region: Secondary | ICD-10-CM | POA: Insufficient documentation

## 2013-07-08 DIAGNOSIS — E669 Obesity, unspecified: Secondary | ICD-10-CM

## 2013-07-08 DIAGNOSIS — I10 Essential (primary) hypertension: Secondary | ICD-10-CM

## 2013-07-08 DIAGNOSIS — M5489 Other dorsalgia: Secondary | ICD-10-CM

## 2013-07-08 DIAGNOSIS — M549 Dorsalgia, unspecified: Secondary | ICD-10-CM | POA: Insufficient documentation

## 2013-07-08 DIAGNOSIS — E119 Type 2 diabetes mellitus without complications: Secondary | ICD-10-CM

## 2013-07-08 DIAGNOSIS — M25542 Pain in joints of left hand: Secondary | ICD-10-CM

## 2013-07-08 DIAGNOSIS — IMO0001 Reserved for inherently not codable concepts without codable children: Secondary | ICD-10-CM

## 2013-07-08 DIAGNOSIS — Z794 Long term (current) use of insulin: Secondary | ICD-10-CM

## 2013-07-08 DIAGNOSIS — M79609 Pain in unspecified limb: Secondary | ICD-10-CM | POA: Insufficient documentation

## 2013-07-08 DIAGNOSIS — M25549 Pain in joints of unspecified hand: Secondary | ICD-10-CM

## 2013-07-08 DIAGNOSIS — E785 Hyperlipidemia, unspecified: Secondary | ICD-10-CM

## 2013-07-08 LAB — PSA: PSA: 0.82 ng/mL (ref <=4.00)

## 2013-07-08 MED ORDER — GLUCOSE BLOOD VI STRP: ORAL_STRIP | Status: DC

## 2013-07-08 MED ORDER — TADALAFIL 5 MG PO TABS: 5.0000 mg | ORAL_TABLET | Freq: Every day | ORAL | Status: DC | PRN

## 2013-07-08 MED ORDER — OLMESARTAN MEDOXOMIL 20 MG PO TABS: ORAL_TABLET | ORAL | Status: DC

## 2013-07-08 MED ORDER — GLIPIZIDE 10 MG PO TABS: 10.0000 mg | ORAL_TABLET | Freq: Two times a day (BID) | ORAL | Status: DC

## 2013-07-08 NOTE — Patient Instructions (Signed)
F/u with rectal July 25 or after, call if you need me before  Excellent labs and blood pressure  Normal foot exam except for callus on heel  It is important that you exercise regularly at least 30 minutes 5 times a week. If you develop chest pain, have severe difficulty breathing, or feel very tired, stop exercising immediately and seek medical attention   A healthy diet is rich in fruit, vegetables and whole grains. Poultry fish, nuts and beans are a healthy choice for protein rather then red meat. A low sodium diet and drinking 64 ounces of water daily is generally recommended. Oils and sweet should be limited. Carbohydrates especially for those who are diabetic or overweight, should be limited to 60-45 gram per meal. It is important to eat on a regular schedule, at least 3 times daily. Snacks should be primarily fruits, vegetables or nuts.  Please test blood sugar at least once every morning, before breakfast, goal is 110 to 130. Increase levimir to 53 to 55 units, and ensure you eat regularly on a schedule, do NOT miss meals  Microalb, HBa1C ,CcBC and chem 7 and EGFR NON fasting 7/23 or after  Use tylenol 559m or tylenol PM at bedtime also rubbing the low back with a muscle rub at bedtime may help pT teaching you back exercises etc will be benficial Xray of back and left index today

## 2013-07-08 NOTE — Progress Notes (Signed)
   Subjective:    Patient ID: Robert [name], male    DOB: Jan 24, 1951, 63 y.o.   MRN: [id number]  HPI The PT is here for follow up and re-evaluation of chronic medical conditions, medication management and review of any available recent lab and radiology data.  Preventive health is updated, specifically  Cancer screening and Immunization.   Questions or concerns regarding consultations or procedures which the PT has had in the interim are  addressed. The PT denies any adverse reactions to current medications since the last visit.   2 year h/o localized LBP non radiaiting getting progressively worse, no radiating, also c/o left index finger pain which radiates into the hand Tests Bg approx 4 times weekly avf FBG around 140 to 150, no hypoglycemic episodes, polyuria or polydipsia       Review of Systems    See HPI Denies recent fever or chills. Denies sinus pressure, nasal congestion, ear pain or sore throat. Denies chest congestion, productive cough or wheezing. Denies chest pains, palpitations and leg swelling Denies abdominal pain, nausea, vomiting,diarrhea or constipation.   Denies dysuria, frequency, hesitancy or incontinence. Denies headaches, seizures, numbness, or tingling. Denies depression, anxiety or insomnia. Denies skin break down or rash.     Objective:   Physical Exam  BP 118/74  Pulse 74  Resp 16  Wt 223 lb 12.8 oz (101.515 kg)  SpO2 98% Patient alert and oriented and in no cardiopulmonary distress.  HEENT: No facial asymmetry, EOMI, no sinus tenderness,  oropharynx pink and moist.  Neck supple no adenopathy.  Chest: Clear to auscultation bilaterally.  CVS: S1, S2 no murmurs, no S3.  ABD: Soft non tender. Bowel sounds normal.  Ext: No edema  MS: Adequate ROM spine, shoulders, hips and knees.Localized low back pain, also localized left index finger pain  Skin: Intact, no ulcerations or rash noted.  Psych: Good eye contact, normal affect. Memory  intact not anxious or depressed appearing.  CNS: CN 2-12 intact, power, tone and sensation normal throughout.       Assessment & Plan:  Diabetes mellitus, insulin dependent (IDDM), controlled Improved Patient advised to reduce carb and sweets, commit to regular physical activity, take meds as prescribed, test blood as directed, and attempt to lose weight, to improve blood sugar control.   Hypertension Controlled, no change in medication DASH diet and commitment to daily physical activity for a minimum of 30 minutes discussed and encouraged, as a part of hypertension management. The importance of attaining a healthy weight is also discussed.   Hyperlipidemia Improved Hyperlipidemia:Low fat diet discussed and encouraged.  No med change  Back pain without sciatica Deterirorated, xray and Pt   Pain involving joint of finger of left hand Left index pain no trauma likley arthritis, will xray affected area  Obesity (BMI 30.0-34.9) Improved. Pt applauded on succesful weight loss through lifestyle change, and encouraged to continue same. Weight loss goal set for the next several months.

## 2013-07-10 NOTE — Assessment & Plan Note (Signed)
Controlled, no change in medication DASH diet and commitment to daily physical activity for a minimum of 30 minutes discussed and encouraged, as a part of hypertension management. The importance of attaining a healthy weight is also discussed.  

## 2013-07-10 NOTE — Assessment & Plan Note (Signed)
Improved. Pt applauded on succesful weight loss through lifestyle change, and encouraged to continue same. Weight loss goal set for the next several months.  

## 2013-07-10 NOTE — Assessment & Plan Note (Signed)
Improved Hyperlipidemia:Low fat diet discussed and encouraged.  No med change 

## 2013-07-10 NOTE — Assessment & Plan Note (Signed)
Deterirorated, xray and Pt

## 2013-07-10 NOTE — Assessment & Plan Note (Signed)
Improved Patient advised to reduce carb and sweets, commit to regular physical activity, take meds as prescribed, test blood as directed, and attempt to lose weight, to improve blood sugar control.  

## 2013-07-10 NOTE — Assessment & Plan Note (Signed)
Left index pain no trauma likley arthritis, will xray affected area

## 2013-07-24 ENCOUNTER — Telehealth (HOSPITAL_COMMUNITY): Payer: Self-pay | Admitting: Dietician

## 2013-07-24 NOTE — Telephone Encounter (Signed)
Received referral via fax from Dr. Simpson for dx: diabetes.  

## 2013-07-29 NOTE — Telephone Encounter (Signed)
Appointment scheduled for 08/05/13 at 1000.

## 2013-08-03 ENCOUNTER — Other Ambulatory Visit: Payer: Self-pay | Admitting: Family Medicine

## 2013-08-05 ENCOUNTER — Encounter (HOSPITAL_COMMUNITY): Payer: Self-pay | Admitting: Dietician

## 2013-08-05 NOTE — Progress Notes (Signed)
Outpatient Initial Nutrition Assessment  Date:08/05/2013   Appt Start Time: 0958  Referring Physician: Dr. Moshe Cipro Reason for Visit: diabetes  Nutrition Assessment:  Height: 5\' 9"  (175.3 cm)   Weight: 219 lb (99.338 kg)   IBW: 160#  %IBW: 137% UBW: 220#  %UBW: 100% Body mass index is 32.33 kg/(m^2). Meets criteria for obesity, class I.  Goal Weight: 200# 10% loss of current wt) Weight hx: Wt Readings from Last 10 Encounters:  08/05/13 219 lb (99.338 kg)  07/08/13 223 lb 12.8 oz (101.515 kg)  03/06/13 227 lb (102.967 kg)  02/26/13 225 lb (102.059 kg)  10/24/12 220 lb (99.791 kg)  06/24/12 222 lb (100.699 kg)  04/01/12 227 lb 6.4 oz (103.148 kg)  02/12/12 227 lb 0.6 oz (102.985 kg)  12/18/11 226 lb 0.6 oz (102.531 kg)  09/19/11 225 lb 1.3 oz (102.096 kg)    Estimated nutritional needs:  Kcals/ day: 2000-2200 Protein (grams)/day: 80-100 Fluid (L)/ day: 2.0-2.2  PMH:  Past Medical History  Diagnosis Date  . Diabetes mellitus, type II   . Hypertension     Lab  09/2011: Normal CMet ex G-133  . Tobacco abuse, in remission     30 pack years; quit in 1985  . Arteriosclerotic cardiovascular disease (ASCVD)     DES to LAD D1 - 2003 in [place]; failed intervention for a totally obstructed RCA at that time; EF of 45%; 12/2009: Equivocal stress nuclear with good exercise tolerance, negative stress EKG, normal EF with mild mid and distal inferior ischemia  . Hyperlipidemia     Lipid profile in 09/2011:128, 130, 33, 69    Medications:  Current Outpatient Rx  Name  Route  Sig  Dispense  Refill  . aspirin 81 MG tablet   Oral   Take 81 mg by mouth daily.           . B-D ULTRAFINE III SHORT PEN 31G X 8 MM MISC      USE AS DIRECTED WITH LEVEMIR FLEXPEN   100 each   4   . betamethasone valerate (VALISONE) 0.1 % cream   Topical   Apply 1 application topically daily.   30 g   0   . glipiZIDE (GLUCOTROL) 10 MG tablet   Oral   Take 1 tablet (10 mg total) by mouth 2 (two) times  daily before a meal.   180 tablet   1   . glucose blood test strip      One touch ultra strip. Twice daily testing   150 each   1   . LEVEMIR FLEXTOUCH 100 UNIT/ML Pen      INJECT 50 UNITS INTO THE SKIN DAILY   15 pen   5   . metoprolol succinate (TOPROL-XL) 50 MG 24 hr tablet      TAKE 1 TABLET DAILY WITH OR IMMEDIATELY FOLLOWING A MEAL   90 tablet   1   . olmesartan (BENICAR) 20 MG tablet      TAKE 1 TABLET DAILY (DOSE REDUCTION)   90 tablet   1   . ONETOUCH DELICA LANCETS MISC      Twice daily testing Dx: uncontrolled HTN   100 each   3   . simvastatin (ZOCOR) 40 MG tablet      TAKE 1 TABLET AT BEDTIME   90 tablet   1   . tadalafil (CIALIS) 5 MG tablet   Oral   Take 1 tablet (5 mg total) by mouth daily as needed.  30 tablet   0     Labs: CMP     Component Value Date/Time   NA 141 07/07/2013 0912   K 4.2 07/07/2013 0912   CL 107 07/07/2013 0912   CO2 25 07/07/2013 0912   GLUCOSE 133* 07/07/2013 0912   BUN 14 07/07/2013 0912   CREATININE 1.21 07/07/2013 0912   CREATININE 1.31 03/14/2010 2150   CALCIUM 8.6 07/07/2013 0912   PROT 6.4 07/07/2013 0912   ALBUMIN 3.8 07/07/2013 0912   AST 20 07/07/2013 0912   ALT 22 07/07/2013 0912   ALKPHOS 100 07/07/2013 0912   BILITOT 0.3 07/07/2013 0912   GFRNONAA 64 07/07/2013 0912   GFRNONAA 9.0 06/15/2009   GFRAA 74 07/07/2013 0912    Lipid Panel     Component Value Date/Time   CHOL 121 07/07/2013 0912   TRIG 120 07/07/2013 0912   HDL 35* 07/07/2013 0912   CHOLHDL 3.5 07/07/2013 0912   VLDL 24 07/07/2013 0912   LDLCALC 62 07/07/2013 0912     Lab Results  Component Value Date   HGBA1C 7.2* 07/07/2013   HGBA1C 7.4* 03/04/2013   HGBA1C 6.9* 10/22/2012   Lab Results  Component Value Date   MICROALBUR 1.66 10/22/2012   LDLCALC 62 07/07/2013   CREATININE 1.21 07/07/2013     Lifestyle/ social habits: Mr. Dambach resides in [place] with his wife, who is present with him today. Occupation: retired.  Physical activity: golfing 5 times per week, a  hobby of 25 years. He was walking approximately 3 miles daily in the evenings, but discontinued this last Thursday due to a back injury. He continues to golf.    Nutrition hx/habits: Mr. Muilenburg reports that he has had diabetes since 2005. He has taken diabetes education classes before. He reports fair control of his diabetes. He has worked on his diet and lost weight in the past, but has unfortunately gained his weight back. He does make good efforts, however, is not consistent.  He reoirts he has cut back on breads, sweets, and sodas. He is drinking diet green tea in place of most sodas, although, he does admit to drinking a regular soda almost daily.  His wife cooks on Sundays, and usually makes enough so that they have leftovers to eat Monday through Wednesday. They go out to eat every Thursday and Friday, usually to [employer], [employer], and [place].  CBGs typically range from 120-140 (AM fasting). He reports it was 210 yesterday morning due to eating a large sub. He also admits to noncompliance as he does not check his blood sugars daily.   Diet recall: Breakfast: scrambled eggs, [place] sausage, 2 waffles, and oatmeal, diet green tea; Snack: piece of fruit, peanut butter crackers Lunch: value meal from [employer] or [employer] (burger, fries and sweet tea or regular sprite); Dinner: ribs, mac and cheese, mixed vegetables, diet green tea.  Beverages mainly consist of water and diet green tea He eats out 1-2 times per week and frequents [employer] (chicken tenders, salad, and potato), [employer] (grilled chicken salad), and [employer] (chicken tenders and salad). He reports he will usually eat 1/2 portion of meal and eat the rest of another meal.   Nutrition Diagnosis: Excessive carbohydrate intake related to diet recall demonstrates intake of more than 75 carbs per meal AEB Hgb A1c: 7.2.   Nutrition Intervention: Nutrition rx: 1500-1700 Kcal NAS, daibetic diet; 3 meals per  day; one snack; low calorie beverages only; Physical activity 30 minutes daily  Education/Counseling Provided: Educated pt on principles of diabetic diet. Discussed carbohydrate metabolism in relation to diabetes. Educated pt on basic self-management principles including: signs and symptoms of hyperglycemia and hypoglycemia, goals for fasting and postprandial blood sugars, goals for Hgb A1c, importance of checking feet, importance of keeping PCP appointments, and foot care. Educated pt on plate method, portion sizes, and sources of carbohydrate. Discussed importance of regular meal pattern. Discussed importance of adding sources of whole grains to diet to improve glycemic control. Also encouraged to choose low fat dairy, lean meats, and whole fruits and vegetables more often. Discussed options of artificial sweeteners and encouraged pt to use which brand she liked best. Discussed nutritional content of foods commonly eaten and discussed healthier alternatives. Discussed importance of compliance to prevent further complications of disease. Educated pt on importance of physical activity (goal of at least 30 minutes 5 times per week) along with a healthy diet to achieve weight loss and glycemic goals. Encouraged slow, moderate weight loss of 1-2# per week, or 7-10% of current body weight. Provided "Diabetes and You" and "Carbohydrate Counting and Meal Planning" handouts. Used [employer] to assess understanding.   Understanding, Motivation, Ability to Follow Recommendations: Expect fair to good compliance.   Monitoring and Evaluation: Goals: 1) 0.5-2# wt loss per week; 2) Physical activity 30 minutes per day; 3) Hgb A1c < 7.0; 4) AM fasting blood sugar: 80-130  Recommendations: 1) For weight loss: 1500-1700 kcals daily; 2) Check blood sugar daily; 3) Break up physical activity into smaller, more frequent sessions  F/U: PRN. Provided RD contact information.   Yaire Kreher A. Jimmye Norman, RD, LDN 08/05/2013  Appt  EndTime: [id number]

## 2013-09-16 ENCOUNTER — Ambulatory Visit (HOSPITAL_COMMUNITY)
Admission: RE | Admit: 2013-09-16 | Discharge: 2013-09-16 | Disposition: A | Discharge status: Home / Self Care | Payer: BC Managed Care – PPO | Source: Ambulatory Visit | Attending: Family Medicine | Admitting: Family Medicine

## 2013-09-16 DIAGNOSIS — IMO0001 Reserved for inherently not codable concepts without codable children: Secondary | ICD-10-CM | POA: Insufficient documentation

## 2013-09-16 DIAGNOSIS — M6281 Muscle weakness (generalized): Secondary | ICD-10-CM | POA: Insufficient documentation

## 2013-09-16 DIAGNOSIS — Z87891 Personal history of nicotine dependence: Secondary | ICD-10-CM | POA: Insufficient documentation

## 2013-09-16 DIAGNOSIS — M545 Low back pain, unspecified: Secondary | ICD-10-CM | POA: Insufficient documentation

## 2013-09-16 DIAGNOSIS — E119 Type 2 diabetes mellitus without complications: Secondary | ICD-10-CM | POA: Insufficient documentation

## 2013-09-16 DIAGNOSIS — I10 Essential (primary) hypertension: Secondary | ICD-10-CM | POA: Insufficient documentation

## 2013-09-16 DIAGNOSIS — M549 Dorsalgia, unspecified: Secondary | ICD-10-CM | POA: Insufficient documentation

## 2013-09-16 DIAGNOSIS — Z794 Long term (current) use of insulin: Secondary | ICD-10-CM | POA: Insufficient documentation

## 2013-09-16 DIAGNOSIS — E785 Hyperlipidemia, unspecified: Secondary | ICD-10-CM | POA: Insufficient documentation

## 2013-09-16 NOTE — Evaluation (Signed)
Physical Therapy Evaluation  Patient Details  Name: Robert [name] MRN: [id number] Date of Birth: 10-Mar-1951  Today's Date: 09/16/2013 Time: 1520-1605 PT Time Calculation (min): 45 min Charge:  Evaluation             Visit#: 1 of 8  Re-eval: 10/16/13 Assessment Diagnosis: low back pain Next MD Visit: 11/10/2013 Prior Therapy: yes  Authorization: BCBS    Past Medical History:  Past Medical History  Diagnosis Date  . Diabetes mellitus, type II   . Hypertension     Lab  09/2011: Normal CMet ex G-133  . Tobacco abuse, in remission     30 pack years; quit in 1985  . Arteriosclerotic cardiovascular disease (ASCVD)     DES to LAD D1 - 2003 in [place]; failed intervention for a totally obstructed RCA at that time; EF of 45%; 12/2009: Equivocal stress nuclear with good exercise tolerance, negative stress EKG, normal EF with mild mid and distal inferior ischemia  . Hyperlipidemia     Lipid profile in 09/2011:128, 130, 33, 69   Past Surgical History:  Past Surgical History  Procedure Laterality Date  . Pressure ulcer debridement  2006    Right lower extremity  . Epididymis surgery  1996  . Knee arthroscopy w/ meniscectomy  11/2008    Right  . Rotator cuff repair  07/2009    Right  . Colonoscopy  2010    Negative screening study    Subjective Symptoms/Limitations Symptoms: Robert [name] states he has had low back pain off and on for years.  He had an acute exacerbation last week after he was putting a floor down with his son.  The pain is in the center but when it gets bad it will radiate to the right side.   How long can you sit comfortably?: five minutes How long can you stand comfortably?: able to stand for quite a while How long can you walk comfortably?: no problem Pain Assessment Currently in Pain?: Yes (bad days pain can increase to a 6-7/10) Pain Score: 2  Pain Location: Back Pain Orientation: Lower Pain Type: Chronic pain Pain Onset: More than a month ago Pain Frequency:  Constant Pain Relieving Factors: lying flat; moving  Effect of Pain on Daily Activities: none   Prior Functioning  Prior Function Vocation: Retired Leisure: Hobbies-yes (Comment) Comments: golf; yard work      Sensation/Coordination/Flexibility/Functional Tests Flexibility 90/90: Positive Functional Tests Functional Tests: foto 54  Assessment RLE Strength Right Hip Flexion: 5/5 Right Hip Extension: 3+/5 Right Hip ABduction: 5/5 Right Knee Flexion: 4/5 Right Knee Extension: 5/5 Right Ankle Dorsiflexion: 5/5 LLE Strength Left Hip Flexion: 5/5 Left Hip Extension: 3+/5 Left Hip ABduction: 5/5 Left Knee Flexion: 4/5 Left Knee Extension: 5/5 Left Ankle Dorsiflexion: 5/5 Lumbar AROM Lumbar Flexion: decreased 20% reps  Lumbar Extension: wfl-slight improvement with reps. Lumbar - Right Side Bend: wnl Lumbar - Left Side Bend: wnl reps no change. Lumbar - Right Rotation: wnl Lumbar - Left Rotation: wnl  Palpation Palpation: no spasms palpatable  Exercise/Treatments  Stretches Active Hamstring Stretch: 3 reps;30 seconds Prone on Elbows Stretch: 60 seconds Press Ups: 5 reps   Prone  Straight Leg Raise: 10 reps Opposite Arm/Leg Raise: 10 reps    Physical Therapy Assessment and Plan PT Assessment and Plan Clinical Impression Statement: Pt is a 63 yo male who has had chronis low back pain.  Exam demonstrates weakened gluts, tight mm and fair to poor body mechanics.  PT will benefit from skilled  therapy to educate in proper body mechanics strengthen back and gluteal mm and stretches to improve his quality of life.  Pt will benefit from skilled therapeutic intervention in order to improve on the following deficits: Decreased strength;Pain;Decreased range of motion Rehab Potential: Good PT Frequency: Min 2X/week PT Duration: 4 weeks PT Treatment/Interventions: Therapeutic activities;Therapeutic exercise PT Plan: begin body mechanics, knee to chest stretches, B UE flexion  at wall, progress to all four and standing stability exercises.    Goals Home Exercise Program Pt/caregiver will Perform Home Exercise Program: For increased strengthening PT Short Term Goals Time to Complete Short Term Goals: 2 weeks PT Short Term Goal 1: Pt pain to be no greater than a 5/10 for better quality of life. PT Short Term Goal 2: Pt to be able to verbalize the importance of proper body mechanics  PT Long Term Goals Time to Complete Long Term Goals: 4 weeks PT Long Term Goal 1: Pt I in advance HEP PT Long Term Goal 2: Pt pain to be no greater than a 3/10 Long Term Goal 3: Pt to be able to golf without pain Long Term Goal 4: Pt to begin a walking program for better health habits.   Problem List Patient Active Problem List   Diagnosis Date Noted  . Back pain 09/16/2013  . Back pain without sciatica 07/08/2013  . Pain involving joint of finger of left hand 07/08/2013  . Diabetes mellitus, insulin dependent (IDDM), controlled 10/25/2012  . Knee pain, left anterior 10/24/2012  . Mole of skin 10/24/2012  . Pain in joint, ankle and foot 06/24/2012  . Hypertension   . Hyperlipidemia   . Obesity (BMI 30.0-34.9) 12/24/2011  . ED (erectile dysfunction) 02/14/2011  . ATHEROSCLEROTIC CARDIOVASCULAR DISEASE 09/27/2009  . TOBACCO ABUSE, HX OF 09/27/2009  . ANEMIA 06/15/2009    PT Plan of Care PT Home Exercise Plan: given  GP    RUSSELL,CINDY 09/16/2013, 4:32 PM  Physician Documentation Your signature is required to indicate approval of the treatment plan as stated above.  Please sign and either send electronically or make a copy of this report for your files and return this physician signed original.   Please mark one 1.__approve of plan  2. ___approve of plan with the following conditions.   ______________________________                                                          _____________________ Physician Signature                                                                                                              Date

## 2013-09-18 ENCOUNTER — Ambulatory Visit (HOSPITAL_COMMUNITY): Payer: BC Managed Care – PPO | Admitting: Physical Therapy

## 2013-09-19 ENCOUNTER — Ambulatory Visit (HOSPITAL_COMMUNITY)
Admission: RE | Admit: 2013-09-19 | Discharge: 2013-09-19 | Disposition: A | Discharge status: Home / Self Care | Payer: BC Managed Care – PPO | Source: Ambulatory Visit | Attending: Family Medicine | Admitting: Family Medicine

## 2013-09-19 NOTE — Progress Notes (Signed)
Physical Therapy Treatment Patient Details  Name: Robert [name] MRN: [id number] Date of Birth: [birth year]  Today's Date: 09/19/2013 Time: 9417-4081 PT Time Calculation (min): 38 min Charge:  There ex [id number] Visit#: 2 of 8  Re-eval: 10/16/13    Authorization: BCBS  Subjective: Symptoms/Limitations Symptoms: Robert [name] has been doing his exercises and states that he feels better.    Exercise/Treatments  Stretches Active Hamstring Stretch: 3 reps;30 seconds Single Knee to Chest Stretch: 3 reps;30 seconds Prone on Elbows Stretch: 60 seconds Press Ups: 5 reps Quadruped Mid Back Stretch: 30 seconds Piriformis Stretch: 3 reps;30 seconds    Standing Lifting: From 12";10 reps Other Standing Lumbar Exercises: B UE flex against wall keeping stabl    Supine Bridge: 15 reps Other Supine Lumbar Exercises: toe tap x 10    Prone  Straight Leg Raise: 10 reps Opposite Arm/Leg Raise: 10 reps Other Prone Lumbar Exercises: double arm raise; B shoulder extension  Quadruped Madcat/Old Horse: 5 reps     Physical Therapy Assessment and Plan PT Assessment and Plan Clinical Impression Statement: Instructed pt in proper lifting techniques as well as additional stabilization and stretches to decrease sx of pain. Pt woked on proper body mechanics coming sit to supine and supine to sit.  Pt demonstrates good technique once instructed. Rehab Potential: Good PT Plan: Begin lunging and sit to stand activites    Goals  progressing  Problem List Patient Active Problem List   Diagnosis Date Noted  . Back pain 09/16/2013  . Back pain without sciatica 07/08/2013  . Pain involving joint of finger of left hand 07/08/2013  . Diabetes mellitus, insulin dependent (IDDM), controlled 10/25/2012  . Knee pain, left anterior 10/24/2012  . Mole of skin 10/24/2012  . Pain in joint, ankle and foot 06/24/2012  . Hypertension   . Hyperlipidemia   . Obesity (BMI 30.0-34.9) 12/24/2011  . ED  (erectile dysfunction) 02/14/2011  . ATHEROSCLEROTIC CARDIOVASCULAR DISEASE 09/27/2009  . TOBACCO ABUSE, HX OF 09/27/2009  . ANEMIA 06/15/2009       GP    RUSSELL,CINDY 09/19/2013, 4:38 PM

## 2013-09-23 ENCOUNTER — Ambulatory Visit (HOSPITAL_COMMUNITY)
Admission: RE | Admit: 2013-09-23 | Discharge: 2013-09-23 | Disposition: A | Discharge status: Home / Self Care | Payer: BC Managed Care – PPO | Source: Ambulatory Visit | Attending: Family Medicine | Admitting: Family Medicine

## 2013-09-23 NOTE — Progress Notes (Signed)
Physical Therapy Treatment Patient Details  Name: Robert [name] MRN: [id number] Date of Birth: 1950/11/23  Today's Date: 09/23/2013 Time: 6384-6659 PT Time Calculation (min): 46 min Charge:TE [id number]  Visit#: 3 of 8  Re-eval: 10/16/13 Assessment Diagnosis: low back pain Next MD Visit: Moshe Cipro 11/10/2013 Prior Therapy: yes  Authorization: BCBS  Authorization Time Period:    Authorization Visit#:   of     Subjective: Symptoms/Limitations Symptoms: Pt stated he feels he is doing a little better, min pain today 2/10 LBP.  Compliant with HEP daily. Pain Assessment Currently in Pain?: Yes Pain Score: 2  Pain Location: Back Pain Orientation: Lower;Mid  Objective:   Exercise/Treatments Stretches Press Ups: 5 reps;10 seconds Quadruped Mid Back Stretch: 30 seconds Standing Functional Squats: Limitations Functional Squats Limitations: 3D hip excursion 10x Lifting: From floor;10 reps;Weights Lifting Weights (lbs): 13# (5# in 8# box) Forward Lunge: 10 reps;Limitations Forward Lunge Limitations: Bil LE on 8in box Side Lunge: 10 reps;Limitations Side Lunge Limitations: Bil LE Prone  Opposite Arm/Leg Raise: Right arm/Left leg;Left arm/Right leg;10 reps;5 seconds Other Prone Lumbar Exercises: double arm raise; B shoulder extension  Quadruped Madcat/Old Horse: 5 reps     Physical Therapy Assessment and Plan PT Assessment and Plan Clinical Impression Statement: Progressed LE strengthening with lunges and sit to stand without HHA.  Pt able to demonstrate appropriate technique with exercises following demonstration by therapist and verbal cueing for appropriate weight distribution with activtiy..  Min cueing with proper lifting for loading mechanics, pt able to verbalize appropriate mechanics with lifting..  No reports of pain through session.   PT Plan: Progress to quadruped activities next session.      Goals PT Short Term Goals PT Short Term Goal 1: Pt pain to be no greater  than a 5/10 for better quality of life. PT Short Term Goal 1 - Progress: Progressing toward goal PT Short Term Goal 2: Pt to be able to verbalize the importance of proper body mechanics  PT Short Term Goal 2 - Progress: Progressing toward goal PT Long Term Goals PT Long Term Goal 1: Pt I in advance HEP PT Long Term Goal 2: Pt pain to be no greater than a 3/10 Long Term Goal 3: Pt to be able to golf without pain Long Term Goal 4: Pt to begin a walking program for better health habits.  Long Term Goal 4 Progress: Progressing toward goal  Problem List Patient Active Problem List   Diagnosis Date Noted  . Back pain 09/16/2013  . Back pain without sciatica 07/08/2013  . Pain involving joint of finger of left hand 07/08/2013  . Diabetes mellitus, insulin dependent (IDDM), controlled 10/25/2012  . Knee pain, left anterior 10/24/2012  . Mole of skin 10/24/2012  . Pain in joint, ankle and foot 06/24/2012  . Hypertension   . Hyperlipidemia   . Obesity (BMI 30.0-34.9) 12/24/2011  . ED (erectile dysfunction) 02/14/2011  . ATHEROSCLEROTIC CARDIOVASCULAR DISEASE 09/27/2009  . TOBACCO ABUSE, HX OF 09/27/2009  . ANEMIA 06/15/2009    PT - End of Session Activity Tolerance: Patient tolerated treatment well General Behavior During Therapy: [facility] for tasks assessed/performed  GP    Aldona Lento 09/23/2013, 3:15 PM

## 2013-09-30 ENCOUNTER — Ambulatory Visit (HOSPITAL_COMMUNITY)
Admission: RE | Admit: 2013-09-30 | Discharge: 2013-09-30 | Disposition: A | Discharge status: Home / Self Care | Payer: BC Managed Care – PPO | Source: Ambulatory Visit | Attending: Family Medicine | Admitting: Family Medicine

## 2013-09-30 NOTE — Progress Notes (Signed)
Physical Therapy Treatment Patient Details  Name: Robert [name] MRN: [id number] Date of Birth: 14-Aug-1950  Today's Date: 09/30/2013 Time: 0800-0845 PT Time Calculation (min): 45 min Visit#: 4 of 8  Re-eval: 10/16/13 Authorization: BCBS  Charges:  therex 45  Subjective: Symptoms/Limitations Symptoms: Pt states his back is a little stiff this morning, 3/10 pain.   STates he played golf yesterday and scored a 79. Pain Assessment Currently in Pain?: Yes Pain Score: 3  Pain Location: Back Pain Orientation: Lower;Mid   Exercise/Treatments Stretches Active Hamstring Stretch: 3 reps;30 seconds Single Knee to Chest Stretch: 3 reps;30 seconds Press Ups: 5 reps;10 seconds Piriformis Stretch: 3 reps;30 seconds;Limitations Piriformis Stretch Limitations: seated Aerobic Stationary Bike: nustep 10' level 3 hills 3 UE/LE Standing Functional Squats: Limitations Functional Squats Limitations: 3D hip excursion 10x Lifting: From floor;10 reps;Weights Lifting Weights (lbs): HEP Forward Lunge: 10 reps;Limitations Forward Lunge Limitations: Bil LE on 8in box Side Lunge: 10 reps;Limitations Side Lunge Limitations: Bil LE on 8" box Other Standing Lumbar Exercises: B UE flex against wall keeping stabl Supine Straight Leg Raise: 10 reps Prone  Opposite Arm/Leg Raise: Right arm/Left leg;Left arm/Right leg;10 reps;5 seconds Other Prone Lumbar Exercises: double arm raise 10 reps; B shoulder extension 10 reps Quadruped Straight Leg Raise: 5 reps     Physical Therapy Assessment and Plan PT Assessment and Plan Clinical Impression Statement: Pt with good form and no complaints during session today.  Introduced quadriped with noted difficulty as compared to prone.  Pt with good lifting techniques, only requiring therapist facilitation with lunges.  Instructed with hip flexor stretch for HEP and alternate piriformis and Hamstring stretch.  PT Plan: continue to progress toward goals with increasing  core difficulty.     Problem List Patient Active Problem List   Diagnosis Date Noted  . Back pain 09/16/2013  . Back pain without sciatica 07/08/2013  . Pain involving joint of finger of left hand 07/08/2013  . Diabetes mellitus, insulin dependent (IDDM), controlled 10/25/2012  . Knee pain, left anterior 10/24/2012  . Mole of skin 10/24/2012  . Pain in joint, ankle and foot 06/24/2012  . Hypertension   . Hyperlipidemia   . Obesity (BMI 30.0-34.9) 12/24/2011  . ED (erectile dysfunction) 02/14/2011  . ATHEROSCLEROTIC CARDIOVASCULAR DISEASE 09/27/2009  . TOBACCO ABUSE, HX OF 09/27/2009  . ANEMIA 06/15/2009    PT - End of Session Activity Tolerance: Patient tolerated treatment well General Behavior During Therapy: [facility] for tasks assessed/performed  GP    Teena Irani, PTA/CLT 09/30/2013, 8:38 AM

## 2013-10-02 ENCOUNTER — Ambulatory Visit (HOSPITAL_COMMUNITY)
Admission: RE | Admit: 2013-10-02 | Discharge: 2013-10-02 | Disposition: A | Discharge status: Home / Self Care | Payer: BC Managed Care – PPO | Source: Ambulatory Visit | Attending: Family Medicine | Admitting: Family Medicine

## 2013-10-02 DIAGNOSIS — E785 Hyperlipidemia, unspecified: Secondary | ICD-10-CM | POA: Diagnosis not present

## 2013-10-02 DIAGNOSIS — Z87891 Personal history of nicotine dependence: Secondary | ICD-10-CM | POA: Insufficient documentation

## 2013-10-02 DIAGNOSIS — M6281 Muscle weakness (generalized): Secondary | ICD-10-CM | POA: Insufficient documentation

## 2013-10-02 DIAGNOSIS — Z794 Long term (current) use of insulin: Secondary | ICD-10-CM | POA: Diagnosis not present

## 2013-10-02 DIAGNOSIS — M545 Low back pain, unspecified: Secondary | ICD-10-CM | POA: Insufficient documentation

## 2013-10-02 DIAGNOSIS — I10 Essential (primary) hypertension: Secondary | ICD-10-CM | POA: Insufficient documentation

## 2013-10-02 DIAGNOSIS — E119 Type 2 diabetes mellitus without complications: Secondary | ICD-10-CM | POA: Diagnosis not present

## 2013-10-02 DIAGNOSIS — IMO0001 Reserved for inherently not codable concepts without codable children: Secondary | ICD-10-CM | POA: Diagnosis present

## 2013-10-02 NOTE — Progress Notes (Signed)
Physical Therapy Treatment Patient Details  Name: Robert [name] MRN: [id number] Date of Birth: 15-Mar-1951  Today's Date: 10/02/2013 Time: 0805-0848 PT Time Calculation (min): 43 min Charge: TE [id number]  Visit#: 5 of 8  Re-eval: 10/16/13 Assessment Diagnosis: low back pain Next MD Visit: Moshe Cipro 11/10/2013 Prior Therapy: yes  Authorization: BCBS  Authorization Time Period:    Authorization Visit#:   of     Subjective: Symptoms/Limitations Symptoms: Pt stated he was stiff today, pain continues in lower.   Reported playing golf yesterday and a score of 88. Pain Assessment Currently in Pain?: Yes Pain Score: 3  Pain Location: Back Pain Orientation: Lower  Objective:  Exercise/Treatments Standing Functional Squats: Limitations Functional Squats Limitations: 3D hip excursion 10x Lifting: From floor;10 reps;Weights Lifting Weights (lbs): 18 Forward Lunge: 10 reps;Limitations Forward Lunge Limitations: Bil LE on 8in box Side Lunge: 10 reps;Limitations Side Lunge Limitations: Bil LE on 8" box Other Standing Lumbar Exercises: Golf swing with blue theraband 10x each direction Other Standing Lumbar Exercises: Half kneeling UE flexion with purple theraband 8x each foot forward Supine Straight Leg Raise: Limitations Straight Leg Raises Limitations: HEP Quadruped Single Arm Raise: 10 reps Straight Leg Raise: 10 reps Opposite Arm/Leg Raise: Right arm/Left leg;Left arm/Right leg;5 reps     Physical Therapy Assessment and Plan PT Assessment and Plan Clinical Impression Statement: Began core strengthening exercises with focus on form with golf swing with min cueing to improve rotation and LE weight distribution.  Pt educated on health  benefits of starting a walking program.  Pt able to verbalize and demonstrate appropriate body mechanics with lifting 18# from floor to waist.  Progressed to quadraped lumbar strengthening and began half kneeling core strengthening exercises with  noted instabilty due to core weakness.  No reports of pain through session.  PT Plan: continue to progress toward goals with increasing core difficulty.    Goals PT Short Term Goals PT Short Term Goal 1: Pt pain to be no greater than a 5/10 for better quality of life. PT Short Term Goal 2: Pt to be able to verbalize the importance of proper body mechanics  PT Short Term Goal 2 - Progress: Met PT Long Term Goals Long Term Goal 3: Pt to be able to golf without pain Long Term Goal 4: Pt to begin a walking program for better health habits.   Problem List Patient Active Problem List   Diagnosis Date Noted  . Back pain 09/16/2013  . Back pain without sciatica 07/08/2013  . Pain involving joint of finger of left hand 07/08/2013  . Diabetes mellitus, insulin dependent (IDDM), controlled 10/25/2012  . Knee pain, left anterior 10/24/2012  . Mole of skin 10/24/2012  . Pain in joint, ankle and foot 06/24/2012  . Hypertension   . Hyperlipidemia   . Obesity (BMI 30.0-34.9) 12/24/2011  . ED (erectile dysfunction) 02/14/2011  . ATHEROSCLEROTIC CARDIOVASCULAR DISEASE 09/27/2009  . TOBACCO ABUSE, HX OF 09/27/2009  . ANEMIA 06/15/2009    PT - End of Session Activity Tolerance: Patient tolerated treatment well General Behavior During Therapy: [facility] for tasks assessed/performed  GP    Aldona Lento 10/02/2013, 8:45 AM

## 2013-10-07 ENCOUNTER — Ambulatory Visit (HOSPITAL_COMMUNITY)
Admission: RE | Admit: 2013-10-07 | Discharge: 2013-10-07 | Disposition: A | Discharge status: Home / Self Care | Payer: BC Managed Care – PPO | Source: Ambulatory Visit

## 2013-10-07 DIAGNOSIS — IMO0001 Reserved for inherently not codable concepts without codable children: Secondary | ICD-10-CM | POA: Diagnosis not present

## 2013-10-07 NOTE — Progress Notes (Signed)
Physical Therapy Treatment Patient Details  Name: Robert [name] MRN: [id number] Date of Birth: [birth year]  Today's Date: 10/07/2013 Time: 1027-2536 PT Time Calculation (min): 86 min Charge:TE [id number]   Visit#: 6 of 8  Re-eval: 10/16/13 Assessment Diagnosis: low back pain Next MD Visit: Moshe Cipro 11/10/2013 Prior Therapy: yes  Authorization: BCBS  Authorization Time Period:    Authorization Visit#:   of     Subjective: Symptoms/Limitations Symptoms: Pt reported golf yesterday with minimal pain following, c/o sitiffness following.  scored 77.  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   Pain Assessment Currently in Pain?: Yes Pain Score: 2  Pain Location: Back  Objective:  Exercise/Treatments Stretches Active Hamstring Stretch: 3 reps;30 seconds;Limitations Active Hamstring Stretch Limitations: supine with hands behind knee, with rope, with 14 in 3 directions Hip Flexor Stretch: 2 reps;30 seconds;1 rep;Limitations Hip Flexor Stretch Limitations: manual and on 14in box Quad Stretch: 2 reps;30 seconds;Limitations Quad Stretch Limitations: standing for hip flexion and quad Aerobic Stationary Bike: nustep 10' level 3 hills 4 UE/LE SPM>100 Standing Functional Squats: Limitations Functional Squats Limitations: 3D hip excursion 10x Other Standing Lumbar Exercises: Half kneeling to standing 10x Bil LE on airex min cueing for forn;  Half kneeling UE flexion and abduction with purple theraban    Physical Therapy Assessment and Plan PT Assessment and Plan Clinical Impression Statement: Progressed core strengtheing exercises for functional activities with kneeling to standing.  Instructed different positions for stretches to improve mobillity for hamstrings, quads and hip flexion muscualture for pt to add to HEP.   PT Plan: Continue with current POC to improve core strengtehning, improve mobilty and increase activity tolerance with nustep or treadmill  to improve walkinig program for improved QOL.      Goals PT Short Term Goals PT Short Term Goal 1: Pt pain to be no greater than a 5/10 for better quality of life. PT Short Term Goal 1 - Progress: Progressing toward goal PT Long Term Goals PT Long Term Goal 1: Pt I in advance HEP PT Long Term Goal 1 - Progress: Progressing toward goal PT Long Term Goal 2: Pt pain to be no greater than a 3/10 Long Term Goal 3: Pt to be able to golf without pain Long Term Goal 3 Progress: Progressing toward goal Long Term Goal 4: Pt to begin a walking program for better health habits.  Long Term Goal 4 Progress: Progressing toward goal  Problem List Patient Active Problem List   Diagnosis Date Noted  . Back pain 09/16/2013  . Back pain without sciatica 07/08/2013  . Pain involving joint of finger of left hand 07/08/2013  . Diabetes mellitus, insulin dependent (IDDM), controlled 10/25/2012  . Knee pain, left anterior 10/24/2012  . Mole of skin 10/24/2012  . Pain in joint, ankle and foot 06/24/2012  . Hypertension   . Hyperlipidemia   . Obesity (BMI 30.0-34.9) 12/24/2011  . ED (erectile dysfunction) 02/14/2011  . ATHEROSCLEROTIC CARDIOVASCULAR DISEASE 09/27/2009  . TOBACCO ABUSE, HX OF 09/27/2009  . ANEMIA 06/15/2009    PT - End of Session Activity Tolerance: Patient tolerated treatment well General Behavior During Therapy: [facility] for tasks assessed/performed  GP    Aldona Lento 10/07/2013, 9:29 AM

## 2013-10-09 ENCOUNTER — Ambulatory Visit (HOSPITAL_COMMUNITY)
Admission: RE | Admit: 2013-10-09 | Discharge: 2013-10-09 | Disposition: A | Discharge status: Home / Self Care | Payer: BC Managed Care – PPO | Source: Ambulatory Visit | Attending: Physical Therapy | Admitting: Physical Therapy

## 2013-10-09 DIAGNOSIS — M545 Low back pain, unspecified: Secondary | ICD-10-CM

## 2013-10-09 DIAGNOSIS — IMO0001 Reserved for inherently not codable concepts without codable children: Secondary | ICD-10-CM | POA: Diagnosis not present

## 2013-10-09 NOTE — Evaluation (Signed)
Physical Therapy Reassessment  Patient Details  Name: Robert [name] MRN: [id number] Date of Birth: July 10, 1950  Today's Date: 10/09/2013 Time: 1017-1100 PT Time Calculation ([name]): 43 [name]    Charges: TherEx 1017-1100          Visit#: 7 of 8  Re-eval: 10/16/13 Assessment Diagnosis: low back pain Next MD Visit: Robert [name] 11/10/2013 Prior Therapy: yes  Authorization: BCBS    Authorization Visit#: 1 of 8   Past Medical History:  Past Medical History  Diagnosis Date  . Diabetes mellitus, type II   . Hypertension     Lab  09/2011: Normal CMet ex G-133  . Tobacco abuse, in remission     30 pack years; quit in 1985  . Arteriosclerotic cardiovascular disease (ASCVD)     DES to LAD D1 - 2003 in [place]; failed intervention for a totally obstructed RCA at that time; EF of 45%; 12/2009: Equivocal stress nuclear with good exercise tolerance, negative stress EKG, normal EF with mild mid and distal inferior ischemia  . Hyperlipidemia     Lipid profile in 09/2011:128, 130, 33, 69   Past Surgical History:  Past Surgical History  Procedure Laterality Date  . Pressure ulcer debridement  2006    Right lower extremity  . Epididymis surgery  1996  . Knee arthroscopy w/ meniscectomy  11/2008    Right  . Rotator cuff repair  07/2009    Right  . Colonoscopy  2010    Negative screening study    Subjective Symptoms/Limitations Symptoms: Pt reported working in the yard yesterday and had some pain in the back followign, today primary complain of low back stiffness.  Pain Assessment Currently in Pain?: No/denies  Sensation/Coordination/Flexibility/Functional Tests Flexibility 90/90: Positive (75 degrees) Functional Tests Functional Tests: FOTO: 63% status, was 54% status Functional Tests: Limited piriformis mobility  Assessment RLE Strength Right Hip Extension: 4/5 Right Knee Flexion: 5/5 LLE Strength Left Hip Extension: 4/5 Left Knee Flexion: 5/5 Lumbar AROM Lumbar Flexion:  WNL  Exercise/Treatments Stretches Active Hamstring Stretch: 3 reps;30 seconds;Limitations Active Hamstring Stretch Limitations: supine with hands behind knee, with rope, with 14 in 3 directions Hip Flexor Stretch: 2 reps;30 seconds;1 rep;Limitations Hip Flexor Stretch Limitations: manual and on 14in box Piriformis Stretch: 5 reps;10 seconds Piriformis Stretch Limitations: seated Aerobic Stationary Bike: nustep 10' level 3 hills 4 UE/LE SPM>100 Standing Functional Squats Limitations: 3D hip excursion 10x Other Standing Lumbar Exercises: 3D Dowel Pendulums 10x each   Physical Therapy Assessment and Plan PT Assessment and Plan Clinical Impression Statement: sessment performed this session with patient displaysing improved strength though and improved body mechanics though he noted continued difficulty with HEP and pain follwoign fgolfing for which the remainder of the session focused. Following stretches patient performed dowel pendulum exercise to further progresss hip and shoulder mobility to decrease rotational strain on lumbar spine durign golfing.  Patient to be discharged next session with HEP for which nex session will focus on education of HEP PT Plan: Education of HEP for home discharge: 3D dowel pendulums, piriformis, hamstring, hip flexor 3 way stretch, squat reach matrix.     Goals PT Short Term Goals PT Short Term Goal 1: Pt pain to be no greater than a 5/10 for better quality of life. PT Short Term Goal 1 - Progress: Met PT Short Term Goal 2: Pt to be able to verbalize the importance of proper body mechanics  PT Short Term Goal 2 - Progress: Met PT Long Term Goals PT Long Term Goal 1:  Pt I in advance HEP PT Long Term Goal 1 - Progress: Met PT Long Term Goal 2: Pt pain to be no greater than a 3/10 PT Long Term Goal 2 - Progress: Not met Long Term Goal 3: Pt to be able to golf without pain Long Term Goal 3 Progress: Met  Problem List Patient Active Problem List    Diagnosis Date Noted  . Back pain 09/16/2013  . Back pain without sciatica 07/08/2013  . Pain involving joint of finger of left hand 07/08/2013  . Diabetes mellitus, insulin dependent (IDDM), controlled 10/25/2012  . Knee pain, left anterior 10/24/2012  . Mole of skin 10/24/2012  . Pain in joint, ankle and foot 06/24/2012  . Hypertension   . Hyperlipidemia   . Obesity (BMI 30.0-34.9) 12/24/2011  . ED (erectile dysfunction) 02/14/2011  . ATHEROSCLEROTIC CARDIOVASCULAR DISEASE 09/27/2009  . TOBACCO ABUSE, HX OF 09/27/2009  . ANEMIA 06/15/2009    PT - End of Session Activity Tolerance: Patient tolerated treatment well General Behavior During Therapy: [facility] for tasks assessed/performed  GP    Katana Berthold R 10/09/2013, 11:18 AM  Physician Documentation Your signature is required to indicate approval of the treatment plan as stated above.  Please sign and either send electronically or make a copy of this report for your files and return this physician signed original.   Please mark one 1.__approve of plan  2. ___approve of plan with the following conditions.   ______________________________                                                          _____________________ Physician Signature                                                                                                             Date

## 2013-10-14 ENCOUNTER — Ambulatory Visit (HOSPITAL_COMMUNITY)
Admission: RE | Admit: 2013-10-14 | Discharge: 2013-10-14 | Disposition: A | Discharge status: Home / Self Care | Payer: BC Managed Care – PPO | Source: Ambulatory Visit | Attending: Family Medicine | Admitting: Family Medicine

## 2013-10-14 ENCOUNTER — Other Ambulatory Visit: Payer: Self-pay | Admitting: Family Medicine

## 2013-10-14 DIAGNOSIS — M545 Low back pain, unspecified: Secondary | ICD-10-CM

## 2013-10-14 DIAGNOSIS — IMO0001 Reserved for inherently not codable concepts without codable children: Secondary | ICD-10-CM | POA: Diagnosis not present

## 2013-10-14 NOTE — Progress Notes (Signed)
Physical Therapy Treatment Patient Details  Name: Robert [name] MRN: [id number] Date of Birth: [birth year]  Today's Date: 10/14/2013 Time: 8756-4332 PT Time Calculation (min): 45 min Charge: TE 9518-8416  Visit#: 8 of 8  Re-eval: 10/16/13 Assessment Diagnosis: low back pain Next MD Visit: Moshe Cipro 11/10/2013 Prior Therapy: yes  Authorization: [employer]  Authorization Time Period:    Authorization Visit#:   of     Subjective: Symptoms/Limitations Symptoms: Pt reports minimal back pain today, 1/10. Pain Assessment Currently in Pain?: Yes Pain Score: 1  Pain Location: Back  Objective:  Exercise/Treatments Stretches Active Hamstring Stretch: 3 reps;30 seconds;Limitations Active Hamstring Stretch Limitations: supine with rope, and standing with 14 in 3 directions Hip Flexor Stretch: 2 reps;30 seconds;1 rep;Limitations Hip Flexor Stretch Limitations: standing on 14in box Piriformis Stretch: 5 reps;10 seconds Piriformis Stretch Limitations: seated Aerobic Stationary Bike: nustep 10' level 5 hills 5 UE/LE SPM>100 Standing Functional Squats Limitations: 3D hip excursion 10x; squat reach matrix with 5# dumbbell Other Standing Lumbar Exercises: 3D Dowel Pendulums 10x each    Physical Therapy Assessment and Plan PT Assessment and Plan Clinical Impression Statement: Session focus on assurance of correct form, technique and frequency of completeing HEP.  Added squat reach matrix with 5#, pt able to demonstrate appropriate form and technique with new exercise and all HEP exercises,  No weight Lt UE following report of rotator cuff problem and pain.  Pt given HEP handout for 3D dowel pendalums, squat reach matrix and streches for piriformis, hamstrings and hip flexion stretch.  Pt reports plans for joining gym following OPPT.   PT Plan: D/C to HEP.      Goals PT Short Term Goals PT Short Term Goal 1: Pt pain to be no greater than a 5/10 for better quality of life. PT Short Term Goal 1 -  Progress: Met PT Short Term Goal 2: Pt to be able to verbalize the importance of proper body mechanics  PT Short Term Goal 2 - Progress: Met PT Long Term Goals PT Long Term Goal 1: Pt I in advance HEP PT Long Term Goal 1 - Progress: Met PT Long Term Goal 2: Pt pain to be no greater than a 3/10 PT Long Term Goal 2 - Progress: Met Long Term Goal 3: Pt to be able to golf without pain Long Term Goal 3 Progress: Met Long Term Goal 4: Pt to begin a walking program for better health habits.  Long Term Goal 4 Progress: Progressing toward goal  Problem List Patient Active Problem List   Diagnosis Date Noted  . Back pain 09/16/2013  . Back pain without sciatica 07/08/2013  . Pain involving joint of finger of left hand 07/08/2013  . Diabetes mellitus, insulin dependent (IDDM), controlled 10/25/2012  . Knee pain, left anterior 10/24/2012  . Mole of skin 10/24/2012  . Pain in joint, ankle and foot 06/24/2012  . Hypertension   . Hyperlipidemia   . Obesity (BMI 30.0-34.9) 12/24/2011  . ED (erectile dysfunction) 02/14/2011  . ATHEROSCLEROTIC CARDIOVASCULAR DISEASE 09/27/2009  . TOBACCO ABUSE, HX OF 09/27/2009  . ANEMIA 06/15/2009    PT - End of Session Activity Tolerance: Patient tolerated treatment well General Behavior During Therapy: [facility] for tasks assessed/performed  GP    Aldona Lento 10/14/2013, 9:46 AM

## 2013-10-29 ENCOUNTER — Other Ambulatory Visit: Payer: Self-pay | Admitting: Family Medicine

## 2013-11-06 LAB — CBC WITH DIFFERENTIAL/PLATELET
Basophils Absolute: 0 10*3/uL (ref 0.0–0.1)
Basophils Relative: 0 % (ref 0–1)
Eosinophils Absolute: 0.2 10*3/uL (ref 0.0–0.7)
Eosinophils Relative: 4 % (ref 0–5)
HCT: 39.6 % (ref 39.0–52.0)
Hemoglobin: 13.6 g/dL (ref 13.0–17.0)
LYMPHS PCT: 35 % (ref 12–46)
Lymphs Abs: 1.8 10*3/uL (ref 0.7–4.0)
MCH: 29.1 pg (ref 26.0–34.0)
MCHC: 34.3 g/dL (ref 30.0–36.0)
MCV: 84.8 fL (ref 78.0–100.0)
Monocytes Absolute: 0.5 10*3/uL (ref 0.1–1.0)
Monocytes Relative: 10 % (ref 3–12)
NEUTROS PCT: 51 % (ref 43–77)
Neutro Abs: 2.6 10*3/uL (ref 1.7–7.7)
PLATELETS: 244 10*3/uL (ref 150–400)
RBC: 4.67 MIL/uL (ref 4.22–5.81)
RDW: 15.1 % (ref 11.5–15.5)
WBC: 5 10*3/uL (ref 4.0–10.5)

## 2013-11-06 LAB — HEMOGLOBIN A1C
HEMOGLOBIN A1C: 7.2 % — AB (ref <5.7)
MEAN PLASMA GLUCOSE: 160 mg/dL — AB (ref <117)

## 2013-11-06 LAB — BASIC METABOLIC PANEL WITH GFR
BUN: 11 mg/dL (ref 6–23)
CALCIUM: 9.1 mg/dL (ref 8.4–10.5)
CO2: 27 mEq/L (ref 19–32)
Chloride: 103 mEq/L (ref 96–112)
Creat: 1.22 mg/dL (ref 0.50–1.35)
GFR, Est African American: 74 mL/min
GFR, Est Non African American: 64 mL/min
Glucose, Bld: 128 mg/dL — ABNORMAL HIGH (ref 70–99)
Potassium: 4.4 mEq/L (ref 3.5–5.3)
SODIUM: 140 meq/L (ref 135–145)

## 2013-11-06 LAB — MICROALBUMIN / CREATININE URINE RATIO
Creatinine, Urine: 243.1 mg/dL
MICROALB/CREAT RATIO: 7.9 mg/g (ref 0.0–30.0)
Microalb, Ur: 1.92 mg/dL — ABNORMAL HIGH (ref 0.00–1.89)

## 2013-11-10 ENCOUNTER — Encounter: Payer: Self-pay | Admitting: Family Medicine

## 2013-11-10 ENCOUNTER — Ambulatory Visit (INDEPENDENT_AMBULATORY_CARE_PROVIDER_SITE_OTHER): Payer: BC Managed Care – PPO | Admitting: Family Medicine

## 2013-11-10 VITALS — BP 122/78 | HR 82 | Resp 16 | Ht 69.0 in | Wt 224.4 lb

## 2013-11-10 DIAGNOSIS — E109 Type 1 diabetes mellitus without complications: Secondary | ICD-10-CM

## 2013-11-10 DIAGNOSIS — N189 Chronic kidney disease, unspecified: Secondary | ICD-10-CM

## 2013-11-10 DIAGNOSIS — IMO0001 Reserved for inherently not codable concepts without codable children: Secondary | ICD-10-CM

## 2013-11-10 DIAGNOSIS — Z23 Encounter for immunization: Secondary | ICD-10-CM | POA: Insufficient documentation

## 2013-11-10 DIAGNOSIS — M549 Dorsalgia, unspecified: Secondary | ICD-10-CM

## 2013-11-10 DIAGNOSIS — M5489 Other dorsalgia: Secondary | ICD-10-CM

## 2013-11-10 DIAGNOSIS — E785 Hyperlipidemia, unspecified: Secondary | ICD-10-CM

## 2013-11-10 DIAGNOSIS — E1122 Type 2 diabetes mellitus with diabetic chronic kidney disease: Secondary | ICD-10-CM

## 2013-11-10 DIAGNOSIS — E1129 Type 2 diabetes mellitus with other diabetic kidney complication: Secondary | ICD-10-CM

## 2013-11-10 DIAGNOSIS — I129 Hypertensive chronic kidney disease with stage 1 through stage 4 chronic kidney disease, or unspecified chronic kidney disease: Secondary | ICD-10-CM

## 2013-11-10 DIAGNOSIS — E119 Type 2 diabetes mellitus without complications: Principal | ICD-10-CM

## 2013-11-10 DIAGNOSIS — Z1211 Encounter for screening for malignant neoplasm of colon: Secondary | ICD-10-CM

## 2013-11-10 DIAGNOSIS — Z794 Long term (current) use of insulin: Principal | ICD-10-CM

## 2013-11-10 LAB — POC HEMOCCULT BLD/STL (OFFICE/1-CARD/DIAGNOSTIC): Fecal Occult Blood, POC: NEGATIVE

## 2013-11-10 NOTE — Assessment & Plan Note (Signed)
Improved with therapy, but pt notes walking causes symptoms, will resume walking but for 20 mins , thwen 30, not 50 as before

## 2013-11-10 NOTE — Assessment & Plan Note (Signed)
Managed with cialis as needed

## 2013-11-10 NOTE — Progress Notes (Signed)
Subjective:    Patient ID: Robert [name], male    DOB: 03/24/1951, 63 y.o.   MRN: [id number]  HPI The PT is here for follow up and re-evaluation of chronic medical conditions, medication management and review of any available recent lab and radiology data.  Preventive health is updated, specifically  Cancer screening and Immunization.   Reports benefit from pT for back, but after resuming 3 mile walk he started experiencing stiffness and spasm, has since stopped. Has podiatry appt in am The PT denies any adverse reactions to current medications since the last visit.  There are no new concerns.  There are no specific complaints  Denies polyuria , polydipsia or huypoglycemic episodes     Review of Systems See HPI Denies recent fever or chills. Denies sinus pressure, nasal congestion, ear pain or sore throat. Denies chest congestion, productive cough or wheezing. Denies chest pains, palpitations and leg swelling Denies abdominal pain, nausea, vomiting,diarrhea or constipation.   Denies dysuria, frequency, hesitancy or incontinence.  Denies headaches, seizures, numbness, or tingling. Denies depression, anxiety or insomnia. Denies skin break down or rash.        Objective:   Physical Exam BP 122/78  Pulse 82  Resp 16  Ht 5\' 9"  (1.753 m)  Wt 224 lb 6.4 oz (101.787 kg)  BMI 33.12 kg/m2  SpO2 97% Patient alert and oriented and in no cardiopulmonary distress.  HEENT: No facial asymmetry, EOMI,   oropharynx pink and moist.  Neck supple no JVD, no mass.  Chest: Clear to auscultation bilaterally.  CVS: S1, S2 no murmurs, no S3.Regular rate.  ABD: Soft non tender. No organomegaly or mass.  Rectal: good sphincter tone, soft brown heme negative stool  Ext: No edema  MS: Adequate ROM spine, shoulders, hips and knees.  Skin: Intact, no ulcerations or rash noted.  Psych: Good eye contact, normal affect. Memory intact not anxious or depressed appearing.  CNS: CN 2-12  intact, power,  normal throughout.no focal deficits noted.        Assessment & Plan:  Diabetes mellitus, insulin dependent (IDDM), controlled Controlled, no change in medication Patient advised to reduce carb and sweets, commit to regular physical activity, take meds as prescribed, test blood as directed, and attempt to lose weight, to improve blood sugar control.   Hypertension associated with stage 2 chronic kidney disease due to type 2 diabetes mellitus Controlled, no change in medication DASH diet and commitment to daily physical activity for a minimum of 30 minutes discussed and encouraged, as a part of hypertension management. The importance of attaining a healthy weight is also discussed.   Special screening for malignant neoplasms, colon Rectal exam is normal . Good sphincter tonne. Heme negative stool  Need for vaccination with 13-polyvalent pneumococcal conjugate vaccine Vaccine adminitered at visit  ED (erectile dysfunction) Managed with cialis as needed  Back pain without sciatica Improved with therapy, but pt notes walking causes symptoms, will resume walking but for 20 mins , thwen 30, not 50 as before  Hyperlipidemia Hyperlipidemia:Low fat diet discussed and encouraged.  Controlled, no change in medication Updated lab needed at/ before next visit.   Obesity (BMI 30.0-34.9) Deteriorated. Patient re-educated about  the importance of commitment to a  minimum of 150 minutes of exercise per week. The importance of healthy food choices with portion control discussed. Encouraged to start a food diary, count calories and to consider  joining a support group. Sample diet sheets offered. Goals set by the patient for  the next several months.

## 2013-11-10 NOTE — Assessment & Plan Note (Signed)
Controlled, no change in medication DASH diet and commitment to daily physical activity for a minimum of 30 minutes discussed and encouraged, as a part of hypertension management. The importance of attaining a healthy weight is also discussed.  

## 2013-11-10 NOTE — Assessment & Plan Note (Signed)
Deteriorated. Patient re-educated about  the importance of commitment to a  minimum of 150 minutes of exercise per week. The importance of healthy food choices with portion control discussed. Encouraged to start a food diary, count calories and to consider  joining a support group. Sample diet sheets offered. Goals set by the patient for the next several months.    

## 2013-11-10 NOTE — Assessment & Plan Note (Signed)
Rectal exam is normal . Good sphincter tonne. Heme negative stool

## 2013-11-10 NOTE — Assessment & Plan Note (Signed)
Controlled, no change in medication Patient advised to reduce carb and sweets, commit to regular physical activity, take meds as prescribed, test blood as directed, and attempt to lose weight, to improve blood sugar control.  

## 2013-11-10 NOTE — Assessment & Plan Note (Signed)
Hyperlipidemia:Low fat diet discussed and encouraged.  Controlled, no change in medication Updated lab needed at/ before next visit.  

## 2013-11-10 NOTE — Assessment & Plan Note (Signed)
Vaccine adminitered at visit

## 2013-11-10 NOTE — Patient Instructions (Signed)
F/u early January, call if you need me before  Labs and blood pressure , also exam are good today  No hidden blood in stool  Prevnartoday.  Call for flu vaccine in next sevral weeks   Fasting lipid, cmp and EGFr and HBa1C in early January  It is important that you exercise regularly at least 30 minutes 5 times a week. If you develop chest pain, have severe difficulty breathing, or feel very tired, stop exercising immediately and seek medical attention  A healthy diet is rich in fruit, vegetables and whole grains. Poultry fish, nuts and beans are a healthy choice for protein rather then red meat. A low sodium diet and drinking 64 ounces of water daily is generally recommended. Oils and sweet should be limited. Carbohydrates especially for those who are diabetic or overweight, should be limited to 60 to 45gm per meal. It is important to eat on a regular schedule, at least 3 times daily. Snacks should be primarily fruits, vegetables or nuts.

## 2013-11-18 ENCOUNTER — Other Ambulatory Visit: Payer: Self-pay | Admitting: Family Medicine

## 2013-11-28 ENCOUNTER — Other Ambulatory Visit: Payer: Self-pay | Admitting: Family Medicine

## 2013-12-02 ENCOUNTER — Ambulatory Visit (INDEPENDENT_AMBULATORY_CARE_PROVIDER_SITE_OTHER): Payer: BC Managed Care – PPO

## 2013-12-02 DIAGNOSIS — Z23 Encounter for immunization: Secondary | ICD-10-CM

## 2013-12-06 ENCOUNTER — Other Ambulatory Visit: Payer: Self-pay | Admitting: Family Medicine

## 2013-12-26 ENCOUNTER — Other Ambulatory Visit: Payer: Self-pay | Admitting: Family Medicine

## 2014-02-13 ENCOUNTER — Ambulatory Visit (INDEPENDENT_AMBULATORY_CARE_PROVIDER_SITE_OTHER): Payer: BC Managed Care – PPO | Admitting: Cardiology

## 2014-02-13 ENCOUNTER — Encounter: Payer: Self-pay | Admitting: Cardiology

## 2014-02-13 VITALS — BP 138/78 | HR 73 | Ht 69.0 in | Wt 225.0 lb

## 2014-02-13 DIAGNOSIS — I2510 Atherosclerotic heart disease of native coronary artery without angina pectoris: Secondary | ICD-10-CM

## 2014-02-13 DIAGNOSIS — I10 Essential (primary) hypertension: Secondary | ICD-10-CM

## 2014-02-13 DIAGNOSIS — E785 Hyperlipidemia, unspecified: Secondary | ICD-10-CM

## 2014-02-13 DIAGNOSIS — N189 Chronic kidney disease, unspecified: Secondary | ICD-10-CM

## 2014-02-13 DIAGNOSIS — E1122 Type 2 diabetes mellitus with diabetic chronic kidney disease: Secondary | ICD-10-CM

## 2014-02-13 MED ORDER — ATORVASTATIN CALCIUM 80 MG PO TABS: 80.0000 mg | ORAL_TABLET | Freq: Every day | ORAL | Status: DC

## 2014-02-13 NOTE — Patient Instructions (Signed)
Your physician wants you to follow-up in: 1 year You will receive a reminder letter in the mail two months in advance. If you don't receive a letter, please call our office to schedule the follow-up appointment.    Your physician has recommended you make the following change in your medication:     STOP Zocor    START Atorvastatin 80 mg daily at dinner      Thank you for choosing [facility] !

## 2014-02-13 NOTE — Progress Notes (Signed)
Clinical Summary Mr. Knauff is a 63 y.o.male seen today for follow up of the following medical problems.   1. CAD - history of prior stenting as described below - denies any significant chest pain, denies any SOB or DOE.  - compliant with meds:   2. HTN - does not check regularly at home - compliant meds, however has not taken yet today.   3. Hyperlipidemia - compliant with simvastatin - 10/2012: TC 133 TG 150 HDL 38 LDL 65 - has upcoming lipid panel with pcp in January.  4. DM II - followed by Dr Rosezena Sensor     Past Medical History  Diagnosis Date  . Diabetes mellitus, type II   . Hypertension     Lab  09/2011: Normal CMet ex G-133  . Tobacco abuse, in remission     30 pack years; quit in 1985  . Arteriosclerotic cardiovascular disease (ASCVD)     DES to LAD D1 - 2003 in [place]; failed intervention for a totally obstructed RCA at that time; EF of 45%; 12/2009: Equivocal stress nuclear with good exercise tolerance, negative stress EKG, normal EF with mild mid and distal inferior ischemia  . Hyperlipidemia     Lipid profile in 09/2011:128, 130, 33, 69     Allergies  Allergen Reactions  . Rosuvastatin Other (See Comments)    Adverse GI symptoms     Current Outpatient Prescriptions  Medication Sig Dispense Refill  . aspirin 81 MG tablet Take 81 mg by mouth daily.      . B-D ULTRAFINE III SHORT PEN 31G X 8 MM MISC USE AS DIRECTED WITH LEVEMIR FLEXPEN 100 each 4  . B-D ULTRAFINE III SHORT PEN 31G X 8 MM MISC USE AS DIRECTED WITH LEVEMIR FLEXPEN 100 each 4  . BENICAR 20 MG tablet TAKE 1 TABLET DAILY (DOSE REDUCTION) 90 tablet 1  . betamethasone valerate (VALISONE) 0.1 % cream Apply 1 application topically daily. 30 g 0  . glipiZIDE (GLUCOTROL) 10 MG tablet TAKE 1 TABLET TWICE A DAY BEFORE MEALS 180 tablet 1  . Insulin Detemir (LEVEMIR) 100 UNIT/ML Pen 55 units daily every morning 15 mL 11  . metoprolol succinate (TOPROL-XL) 50 MG 24 hr tablet TAKE 1 TABLET DAILY WITH  OR IMMEDIATELY FOLLOWING A MEAL 90 tablet 0  . ONE TOUCH ULTRA TEST test strip TEST TWICE DAILY 100 each 2  . ONETOUCH DELICA LANCETS MISC Twice daily testing Dx: uncontrolled HTN 100 each 3  . simvastatin (ZOCOR) 40 MG tablet TAKE 1 TABLET AT BEDTIME 90 tablet 0  . tadalafil (CIALIS) 5 MG tablet Take 1 tablet (5 mg total) by mouth daily as needed. 30 tablet 0   No current facility-administered medications for this visit.     Past Surgical History  Procedure Laterality Date  . Pressure ulcer debridement  2006    Right lower extremity  . Epididymis surgery  1996  . Knee arthroscopy w/ meniscectomy  11/2008    Right  . Rotator cuff repair  07/2009    Right  . Colonoscopy  2010    Negative screening study     Allergies  Allergen Reactions  . Rosuvastatin Other (See Comments)    Adverse GI symptoms      Family History  Problem Relation Age of Onset  . Diabetes Sister   . Arthritis Brother   . Diabetes Brother   . Cancer Father     Hypernephroma     Social History Mr. Stehlik reports  that he quit smoking about 30 years ago. He does not have any smokeless tobacco history on file. Mr. Didonato reports that he does not drink alcohol.   Review of Systems CONSTITUTIONAL: No weight loss, fever, chills, weakness or fatigue.  HEENT: Eyes: No visual loss, blurred vision, double vision or yellow sclerae.No hearing loss, sneezing, congestion, runny nose or sore throat.  SKIN: No rash or itching.  CARDIOVASCULAR: per HPI RESPIRATORY: No shortness of breath, cough or sputum.  GASTROINTESTINAL: No anorexia, nausea, vomiting or diarrhea. No abdominal pain or blood.  GENITOURINARY: No burning on urination, no polyuria NEUROLOGICAL: No headache, dizziness, syncope, paralysis, ataxia, numbness or tingling in the extremities. No change in bowel or bladder control.  MUSCULOSKELETAL: No muscle, back pain, joint pain or stiffness.  LYMPHATICS: No enlarged nodes. No history of splenectomy.    PSYCHIATRIC: No history of depression or anxiety.  ENDOCRINOLOGIC: No reports of sweating, cold or heat intolerance. No polyuria or polydipsia.  Marland Kitchen   Physical Examination p 73 bp 138/78 Wt 225 lbs BMI 33 Gen: resting comfortably, no acute distress HEENT: no scleral icterus, pupils equal round and reactive, no palptable cervical adenopathy,  CV: RRR, no m/r/g, no JVD, no carotid bruits Resp: Clear to auscultation bilaterally GI: abdomen is soft, non-tender, non-distended, normal bowel sounds, no hepatosplenomegaly MSK: extremities are warm, no edema.  Skin: warm, no rash Neuro:  no focal deficits Psych: appropriate affect   Diagnostic Studies 02/13/14 Clinic EKG Reviewed in clinic, SR, inferior Q waves. , low voltate limb leads     Assessment and Plan   1. CAD - no current symptoms, continue secondary prevention and risk factor modification  2. HTN - slightly above goal today (given DM goal <130/80) however he as not taken his meds yet, would anticipate that his regimen would bring his systolic down the needed 8 mmHg. Continue to follow bp, no changes  3. Hyperlipidemia - given known CAD, will change to high dose statin (atorva 80mg ) consistent with most recent lipid guidelines.   4. Former tobacco smoker - will need AAA screen at age 89  5. DM 2 - management per pcp  Arnoldo Lenis, M.D.

## 2014-02-17 ENCOUNTER — Telehealth: Payer: Self-pay | Admitting: Cardiology

## 2014-02-17 NOTE — Telephone Encounter (Signed)
Please see refill bin / tgs  °

## 2014-03-08 ENCOUNTER — Other Ambulatory Visit: Payer: Self-pay | Admitting: Family Medicine

## 2014-03-10 LAB — HM DIABETES EYE EXAM

## 2014-04-02 LAB — LIPID PANEL
CHOLESTEROL: 114 mg/dL (ref 0–200)
HDL: 35 mg/dL — ABNORMAL LOW (ref >39)
LDL Cholesterol: 51 mg/dL (ref 0–99)
Total CHOL/HDL Ratio: 3.3 Ratio
Triglycerides: 139 mg/dL (ref <150)
VLDL: 28 mg/dL (ref 0–40)

## 2014-04-02 LAB — COMPLETE METABOLIC PANEL WITH GFR
ALT: 27 U/L (ref 0–53)
AST: 18 U/L (ref 0–37)
Albumin: 3.9 g/dL (ref 3.5–5.2)
Alkaline Phosphatase: 136 U/L — ABNORMAL HIGH (ref 39–117)
BILIRUBIN TOTAL: 0.5 mg/dL (ref 0.2–1.2)
BUN: 11 mg/dL (ref 6–23)
CALCIUM: 8.9 mg/dL (ref 8.4–10.5)
CO2: 29 mEq/L (ref 19–32)
CREATININE: 1.22 mg/dL (ref 0.50–1.35)
Chloride: 102 mEq/L (ref 96–112)
GFR, Est African American: 73 mL/min
GFR, Est Non African American: 63 mL/min
GLUCOSE: 125 mg/dL — AB (ref 70–99)
Potassium: 4.4 mEq/L (ref 3.5–5.3)
Sodium: 138 mEq/L (ref 135–145)
TOTAL PROTEIN: 7.2 g/dL (ref 6.0–8.3)

## 2014-04-02 LAB — HEMOGLOBIN A1C
Hgb A1c MFr Bld: 7.7 % — ABNORMAL HIGH (ref <5.7)
Mean Plasma Glucose: 174 mg/dL — ABNORMAL HIGH (ref <117)

## 2014-04-06 ENCOUNTER — Encounter (INDEPENDENT_AMBULATORY_CARE_PROVIDER_SITE_OTHER): Payer: Self-pay

## 2014-04-06 ENCOUNTER — Encounter: Payer: Self-pay | Admitting: Family Medicine

## 2014-04-06 ENCOUNTER — Ambulatory Visit (INDEPENDENT_AMBULATORY_CARE_PROVIDER_SITE_OTHER): Payer: BC Managed Care – PPO | Admitting: Family Medicine

## 2014-04-06 VITALS — BP 130/80 | HR 82 | Resp 16 | Ht 69.0 in | Wt 223.0 lb

## 2014-04-06 DIAGNOSIS — E109 Type 1 diabetes mellitus without complications: Secondary | ICD-10-CM

## 2014-04-06 DIAGNOSIS — M79641 Pain in right hand: Secondary | ICD-10-CM

## 2014-04-06 DIAGNOSIS — N182 Chronic kidney disease, stage 2 (mild): Secondary | ICD-10-CM

## 2014-04-06 DIAGNOSIS — M79642 Pain in left hand: Secondary | ICD-10-CM

## 2014-04-06 DIAGNOSIS — E669 Obesity, unspecified: Secondary | ICD-10-CM

## 2014-04-06 DIAGNOSIS — M5489 Other dorsalgia: Secondary | ICD-10-CM

## 2014-04-06 DIAGNOSIS — E785 Hyperlipidemia, unspecified: Secondary | ICD-10-CM

## 2014-04-06 DIAGNOSIS — IMO0001 Reserved for inherently not codable concepts without codable children: Secondary | ICD-10-CM

## 2014-04-06 DIAGNOSIS — E1122 Type 2 diabetes mellitus with diabetic chronic kidney disease: Secondary | ICD-10-CM

## 2014-04-06 DIAGNOSIS — N528 Other male erectile dysfunction: Secondary | ICD-10-CM

## 2014-04-06 DIAGNOSIS — I129 Hypertensive chronic kidney disease with stage 1 through stage 4 chronic kidney disease, or unspecified chronic kidney disease: Secondary | ICD-10-CM

## 2014-04-06 DIAGNOSIS — Z125 Encounter for screening for malignant neoplasm of prostate: Secondary | ICD-10-CM

## 2014-04-06 DIAGNOSIS — Z794 Long term (current) use of insulin: Secondary | ICD-10-CM

## 2014-04-06 DIAGNOSIS — E119 Type 2 diabetes mellitus without complications: Secondary | ICD-10-CM

## 2014-04-06 NOTE — Progress Notes (Signed)
Subjective:    Patient ID: Robert [name], male    DOB: [birth year], 64 y.o.   MRN: [id number]  HPI  The PT is here for follow up and re-evaluation of chronic medical conditions, medication management and review of any available recent lab and radiology data.  Preventive health is updated, specifically  Cancer screening and Immunization.   Questions or concerns regarding consultations or procedures which the PT has had in the interim are  addressed. The PT denies any adverse reactions to current medications since the last visit.  2 year h/o worsening bilateral hand pain and stiffness in the joins with spasm of the finger 6 year h/o low back pain and stiffness up to a 5 or 6, sometimes radiating to right hip Denies polyuria, polydipsia, blurred vision , or hypoglycemic episodes.     Review of Systems See HPI Denies recent fever or chills. Denies sinus pressure, nasal congestion, ear pain or sore throat. Denies chest congestion, productive cough or wheezing. Denies chest pains, palpitations and leg swelling Denies abdominal pain, nausea, vomiting,diarrhea or constipation.   Denies dysuria, frequency, hesitancy or incontinence.  Denies headaches, seizures, numbness, or tingling. Denies depression, anxiety or insomnia. Denies skin break down or rash.        Objective:   Physical Exam  BP 130/80 mmHg  Pulse 82  Resp 16  Ht 5\' 9"  (1.753 m)  Wt 223 lb (101.152 kg)  BMI 32.92 kg/m2  SpO2 97% Patient alert and oriented and in no cardiopulmonary distress.  HEENT: No facial asymmetry, EOMI,   oropharynx pink and moist.  Neck supple no JVD, no mass.  Chest: Clear to auscultation bilaterally.  CVS: S1, S2 no murmurs, no S3.Regular rate.  ABD: Soft non tender.   Ext: No edema  MS: Adequate  ROM spine, shoulders, hips and knees.  Skin: Intact, no ulcerations or rash noted.  Psych: Good eye contact, normal affect. Memory intact not anxious or depressed appearing.  CNS:  CN 2-12 intact, power,  normal throughout.no focal deficits noted.       Assessment & Plan:  Hypertension associated with stage 2 chronic kidney disease due to type 2 diabetes mellitus Controlled, no change in medication DASH diet and commitment to daily physical activity for a minimum of 30 minutes discussed and encouraged, as a part of hypertension management. The importance of attaining a healthy weight is also discussed.   Diabetes mellitus, insulin dependent (IDDM), controlled Deteriorated, needs to count carbs and commit to daily exercise No med change Updated lab needed at/ before next visit.   Hyperlipidemia LDL goal <70 Controlled, no change in medication HDL is low, needs to commit to regular physiocal activity  Obesity (BMI 30.0-34.9) Unchnaged. Patient re-educated about  the importance of commitment to a  minimum of 150 minutes of exercise per week. The importance of healthy food choices with portion control discussed. Encouraged to start a food diary, count calories and to consider  joining a support group. Sample diet sheets offered. Goals set by the patient for the next several months.     ED (erectile dysfunction) Good results with cialis, continue same  Back pain without sciatica Increased pain and stiffness, not active, pt top commit to daily aerobic activity , use tylenol 500 to 1000 mg as needed, warm compresses and massage  Bilateral hand pain Worsening bilateral hand pain with joint stiffness, may have minor element of rheumatouid disease, pt concernesd about fibromyalgia, also voices possibility of disability benefit, will have rheumatology  evaluate, exam is consistent with osteoarthritis

## 2014-04-06 NOTE — Assessment & Plan Note (Signed)
Unchnaged. Patient re-educated about  the importance of commitment to a  minimum of 150 minutes of exercise per week. The importance of healthy food choices with portion control discussed. Encouraged to start a food diary, count calories and to consider  joining a support group. Sample diet sheets offered. Goals set by the patient for the next several months.    

## 2014-04-06 NOTE — Assessment & Plan Note (Signed)
Deteriorated, needs to count carbs and commit to daily exercise No med change Updated lab needed at/ before next visit.

## 2014-04-06 NOTE — Assessment & Plan Note (Signed)
Controlled, no change in medication DASH diet and commitment to daily physical activity for a minimum of 30 minutes discussed and encouraged, as a part of hypertension management. The importance of attaining a healthy weight is also discussed.  

## 2014-04-06 NOTE — Assessment & Plan Note (Signed)
Good results with cialis, continue same

## 2014-04-06 NOTE — Assessment & Plan Note (Signed)
Increased pain and stiffness, not active, pt top commit to daily aerobic activity , use tylenol 500 to 1000 mg as needed, warm compresses and massage

## 2014-04-06 NOTE — Patient Instructions (Addendum)
F/u April 9 or after, labs 04/05 or after  Commit to daily morning walk  Tylenol 1 to 2 every night will help back pain  You are referred to rheumatologist re hand pain  Measure carbs in diet and cut out obvious sugar  PSA, TSH, HBA1C, chem7 and EGFR non fasting April 5 or after

## 2014-04-06 NOTE — Assessment & Plan Note (Signed)
Controlled, no change in medication HDL is low, needs to commit to regular physiocal activity

## 2014-04-06 NOTE — Assessment & Plan Note (Signed)
Worsening bilateral hand pain with joint stiffness, may have minor element of rheumatouid disease, pt concernesd about fibromyalgia, also voices possibility of disability benefit, will have rheumatology evaluate, exam is consistent with osteoarthritis

## 2014-04-07 ENCOUNTER — Other Ambulatory Visit: Payer: Self-pay

## 2014-04-07 MED ORDER — GLIPIZIDE 10 MG PO TABS: ORAL_TABLET | ORAL | Status: DC

## 2014-04-16 ENCOUNTER — Other Ambulatory Visit: Payer: Self-pay | Admitting: Family Medicine

## 2014-04-20 ENCOUNTER — Other Ambulatory Visit: Payer: Self-pay | Admitting: Family Medicine

## 2014-05-19 ENCOUNTER — Other Ambulatory Visit: Payer: Self-pay | Admitting: Family Medicine

## 2014-06-16 ENCOUNTER — Other Ambulatory Visit: Payer: Self-pay | Admitting: Family Medicine

## 2014-07-23 ENCOUNTER — Other Ambulatory Visit: Payer: Self-pay | Admitting: Family Medicine

## 2014-08-04 ENCOUNTER — Encounter (INDEPENDENT_AMBULATORY_CARE_PROVIDER_SITE_OTHER): Payer: Self-pay | Admitting: *Deleted

## 2014-08-07 LAB — BASIC METABOLIC PANEL WITH GFR
BUN: 14 mg/dL (ref 6–23)
CO2: 25 mEq/L (ref 19–32)
Calcium: 8.9 mg/dL (ref 8.4–10.5)
Chloride: 106 mEq/L (ref 96–112)
Creat: 1.16 mg/dL (ref 0.50–1.35)
GFR, Est African American: 78 mL/min
GFR, Est Non African American: 67 mL/min
Glucose, Bld: 133 mg/dL — ABNORMAL HIGH (ref 70–99)
POTASSIUM: 4.3 meq/L (ref 3.5–5.3)
Sodium: 139 mEq/L (ref 135–145)

## 2014-08-07 LAB — TSH: TSH: 1.253 u[IU]/mL (ref 0.350–4.500)

## 2014-08-07 LAB — PSA: PSA: 1.95 ng/mL (ref <=4.00)

## 2014-08-07 LAB — HEMOGLOBIN A1C
Hgb A1c MFr Bld: 7.5 % — ABNORMAL HIGH (ref <5.7)
Mean Plasma Glucose: 169 mg/dL — ABNORMAL HIGH (ref <117)

## 2014-08-12 ENCOUNTER — Encounter: Payer: Self-pay | Admitting: Family Medicine

## 2014-08-12 ENCOUNTER — Ambulatory Visit (INDEPENDENT_AMBULATORY_CARE_PROVIDER_SITE_OTHER): Payer: BLUE CROSS/BLUE SHIELD | Admitting: Family Medicine

## 2014-08-12 VITALS — BP 130/76 | HR 70 | Resp 18 | Ht 69.0 in | Wt 221.0 lb

## 2014-08-12 DIAGNOSIS — E669 Obesity, unspecified: Secondary | ICD-10-CM

## 2014-08-12 DIAGNOSIS — E109 Type 1 diabetes mellitus without complications: Secondary | ICD-10-CM

## 2014-08-12 DIAGNOSIS — I2510 Atherosclerotic heart disease of native coronary artery without angina pectoris: Secondary | ICD-10-CM | POA: Diagnosis not present

## 2014-08-12 DIAGNOSIS — IMO0001 Reserved for inherently not codable concepts without codable children: Secondary | ICD-10-CM

## 2014-08-12 DIAGNOSIS — Z794 Long term (current) use of insulin: Principal | ICD-10-CM

## 2014-08-12 DIAGNOSIS — E1122 Type 2 diabetes mellitus with diabetic chronic kidney disease: Secondary | ICD-10-CM

## 2014-08-12 DIAGNOSIS — E119 Type 2 diabetes mellitus without complications: Principal | ICD-10-CM

## 2014-08-12 DIAGNOSIS — N521 Erectile dysfunction due to diseases classified elsewhere: Secondary | ICD-10-CM

## 2014-08-12 DIAGNOSIS — E785 Hyperlipidemia, unspecified: Secondary | ICD-10-CM

## 2014-08-12 DIAGNOSIS — N182 Chronic kidney disease, stage 2 (mild): Secondary | ICD-10-CM

## 2014-08-12 DIAGNOSIS — I129 Hypertensive chronic kidney disease with stage 1 through stage 4 chronic kidney disease, or unspecified chronic kidney disease: Secondary | ICD-10-CM

## 2014-08-12 MED ORDER — INSULIN DETEMIR 100 UNIT/ML FLEXPEN: PEN_INJECTOR | SUBCUTANEOUS | Status: DC

## 2014-08-12 MED ORDER — OLMESARTAN MEDOXOMIL 20 MG PO TABS: ORAL_TABLET | ORAL | Status: DC

## 2014-08-12 MED ORDER — TADALAFIL 5 MG PO TABS: ORAL_TABLET | ORAL | Status: DC

## 2014-08-12 MED ORDER — GLIPIZIDE 10 MG PO TABS: ORAL_TABLET | ORAL | Status: DC

## 2014-08-12 MED ORDER — METOPROLOL SUCCINATE ER 50 MG PO TB24: ORAL_TABLET | ORAL | Status: DC

## 2014-08-12 NOTE — Assessment & Plan Note (Signed)
Improvd though still not at goal Increase insulin to 60 units, FBG goal is 90 to 130 also increase exercise when able

## 2014-08-12 NOTE — Assessment & Plan Note (Signed)
Updated lab needed at/ before next visit. lipitor dose recently increased by cardiology

## 2014-08-12 NOTE — Progress Notes (Signed)
DIARRA KOS     MRN: [id number]      DOB: 1950-12-31   HPI Mr. Mcilwain is here for follow up and re-evaluation of chronic medical conditions, medication management and review of any available recent lab and radiology data.  Preventive health is updated, specifically  Cancer screening and Immunization.   Questions or concerns regarding consultations or procedures which the PT has had in the interim are  addressed. The PT denies any adverse reactions to current medications since the last visit.  3 month h/o exertional fatigue ROS Denies recent fever or chills. Denies sinus pressure, nasal congestion, ear pain or sore throat. Denies chest congestion, productive cough or wheezing. Denies chest pains, palpitations and leg swelling. 3 month h/o exertional fatigue, needs to rest with physical activity , sooner than in the past Denies abdominal pain, nausea, vomiting,diarrhea or constipation.   Denies dysuria, frequency, hesitancy or incontinence. Denies joint pain, swelling and limitation in mobility. Denies headaches, seizures, numbness, or tingling. Denies depression, anxiety or insomnia. Denies skin break down or rash.   PE  BP 130/76 mmHg  Pulse 70  Resp 18  Ht 5\' 9"  (1.753 m)  Wt 221 lb 0.6 oz (100.263 kg)  BMI 32.63 kg/m2  SpO2 96%  Patient alert and oriented and in no cardiopulmonary distress.  HEENT: No facial asymmetry, EOMI,   oropharynx pink and moist.  Neck supple no JVD, no mass.  Chest: Clear to auscultation bilaterally.  CVS: S1, S2 no murmurs, no S3.Regular rate.  ABD: Soft non tender.   Ext: No edema  MS: Adequate ROM spine, shoulders, hips and knees.  Skin: Intact, no ulcerations or rash noted.  Psych: Good eye contact, normal affect. Memory intact not anxious or depressed appearing.  CNS: CN 2-12 intact, power,  normal throughout.no focal deficits noted.   Assessment & Plan   **Cardiovascular disease 3 month h/o increased exertional fatigue,  needs cardiology re eval   Hypertension associated with stage 2 chronic kidney disease due to type 2 diabetes mellitus Controlled, no change in medication DASH diet and commitment to daily physical activity for a minimum of 30 minutes discussed and encouraged, as a part of hypertension management. The importance of attaining a healthy weight is also discussed.  BP/Weight 08/12/2014 04/06/2014 02/13/2014 11/10/2013 08/05/2013 07/08/2013 83/04/5174  Systolic BP 160 737 106 269 - 485 462  Diastolic BP 76 80 78 78 - 74 78  Wt. (Lbs) 221.04 223 225 224.4 219 223.8 227  BMI 32.63 32.92 33.21 33.12 32.33 33.03 33.51         Diabetes mellitus, insulin dependent (IDDM), controlled Improvd though still not at goal Increase insulin to 60 units, FBG goal is 90 to 130 also increase exercise when able   Obesity (BMI 30.0-34.9) Unchanged, c/o bone spur on left great toe. Patient re-educated about  the importance of commitment to a  minimum of 150 minutes of exercise per week.  The importance of healthy food choices with portion control discussed. Encouraged to start a food diary, count calories and to consider  joining a support group. Sample diet sheets offered. Goals set by the patient for the next several months.   Weight /BMI 08/12/2014 04/06/2014 02/13/2014  WEIGHT 221 lb 0.6 oz 223 lb 225 lb  HEIGHT 5\' 9"  5\' 9"  5\' 9"   BMI 32.63 kg/m2 32.92 kg/m2 33.21 kg/m2    Current exercise per week **90* minutes.    Hyperlipidemia LDL goal <70 Updated lab needed at/ before next visit.  lipitor dose recently increased by cardiology   ED (erectile dysfunction) Adequate response to cialis , continue same, also assists with symptoms of frequency    *

## 2014-08-12 NOTE — Assessment & Plan Note (Signed)
3 month h/o increased exertional fatigue, needs cardiology re eval

## 2014-08-12 NOTE — Assessment & Plan Note (Signed)
Controlled, no change in medication DASH diet and commitment to daily physical activity for a minimum of 30 minutes discussed and encouraged, as a part of hypertension management. The importance of attaining a healthy weight is also discussed.  BP/Weight 08/12/2014 04/06/2014 02/13/2014 11/10/2013 08/05/2013 07/08/2013 95/11/4415  Systolic BP 127 871 836 725 - 500 164  Diastolic BP 76 80 78 78 - 74 78  Wt. (Lbs) 221.04 223 225 224.4 219 223.8 227  BMI 32.63 32.92 33.21 33.12 32.33 33.03 33.51

## 2014-08-12 NOTE — Patient Instructions (Signed)
F/u with depression screen in 4 month, call if you need me before  Fasting lipid,HBa1C, cmp and EGFr, microalb and CBC in 4 month  You are referred to heart  Specialist  INCREASE insulin to 60 units daily, call in 3 weeks if fasting sugars remain high please Goal for fasting blood sugar ranges from 90to 130 and 2 hours after any meal or at bedtime should be between 140 to 180.   It is important that you exercise regularly at least 30 minutes 5 times a week. If you develop chest pain, have severe difficulty breathing, or feel very tired, stop exercising immediately and seek medical attention   PLEASE try to get involved in community activities to help those who need your help, this will add quality and meaning to your everyday life!   Thanks for choosing [facility], we consider it a privelige to serve you.   

## 2014-08-12 NOTE — Assessment & Plan Note (Signed)
Unchanged, c/o bone spur on left great toe. Patient re-educated about  the importance of commitment to a  minimum of 150 minutes of exercise per week.  The importance of healthy food choices with portion control discussed. Encouraged to start a food diary, count calories and to consider  joining a support group. Sample diet sheets offered. Goals set by the patient for the next several months.   Weight /BMI 08/12/2014 04/06/2014 02/13/2014  WEIGHT 221 lb 0.6 oz 223 lb 225 lb  HEIGHT 5\' 9"  5\' 9"  5\' 9"   BMI 32.63 kg/m2 32.92 kg/m2 33.21 kg/m2    Current exercise per week **90* minutes.

## 2014-08-13 ENCOUNTER — Other Ambulatory Visit (INDEPENDENT_AMBULATORY_CARE_PROVIDER_SITE_OTHER): Payer: Self-pay | Admitting: *Deleted

## 2014-08-13 DIAGNOSIS — Z1211 Encounter for screening for malignant neoplasm of colon: Secondary | ICD-10-CM

## 2014-08-15 NOTE — Assessment & Plan Note (Signed)
Adequate response to cialis , continue same, also assists with symptoms of frequency

## 2014-08-21 ENCOUNTER — Encounter: Payer: Self-pay | Admitting: *Deleted

## 2014-08-21 ENCOUNTER — Ambulatory Visit (INDEPENDENT_AMBULATORY_CARE_PROVIDER_SITE_OTHER): Payer: BLUE CROSS/BLUE SHIELD | Admitting: Adult Health

## 2014-08-21 ENCOUNTER — Encounter: Payer: Self-pay | Admitting: Adult Health

## 2014-08-21 VITALS — BP 128/82 | HR 82 | Ht 69.0 in | Wt 226.0 lb

## 2014-08-21 DIAGNOSIS — Z136 Encounter for screening for cardiovascular disorders: Secondary | ICD-10-CM

## 2014-08-21 DIAGNOSIS — I2510 Atherosclerotic heart disease of native coronary artery without angina pectoris: Secondary | ICD-10-CM | POA: Insufficient documentation

## 2014-08-21 NOTE — Progress Notes (Deleted)
Name: Robert [name]    DOB: 1950/10/23  Age: 64 y.o.  MR#: [id number]       PCP:  Tula Nakayama, MD      Insurance: Payor: [place] / Plan: [place] / Product Type: *No Product type* /   CC:    Chief Complaint  Patient presents with  . Coronary Artery Disease  . Hypertension  . Hyperlipidemia    VS Filed Vitals:   08/21/14 1410  BP: 128/82  Pulse: 82  Height: 5\' 9"  (1.753 m)  Weight: 226 lb (102.513 kg)  SpO2: 96%    Weights Current Weight  08/21/14 226 lb (102.513 kg)  08/12/14 221 lb 0.6 oz (100.263 kg)  04/06/14 223 lb (101.152 kg)    Blood Pressure  BP Readings from Last 3 Encounters:  08/21/14 128/82  08/12/14 130/76  04/06/14 130/80     Admit date:  (Not on file) Last encounter with RMR:  Visit date not found   Allergy Rosuvastatin  Current Outpatient Prescriptions  Medication Sig Dispense Refill  . aspirin 81 MG tablet Take 81 mg by mouth daily.      Marland Kitchen atorvastatin (LIPITOR) 80 MG tablet Take 1 tablet (80 mg total) by mouth daily. 90 tablet 3  . B-D ULTRAFINE III SHORT PEN 31G X 8 MM MISC USE AS DIRECTED WITH LEVEMIR FLEXPEN 100 each 4  . betamethasone valerate (VALISONE) 0.1 % cream Apply 1 application topically daily. 30 g 0  . glipiZIDE (GLUCOTROL) 10 MG tablet TAKE 1 TABLET TWICE A DAY BEFORE MEALS 180 tablet 1  . Insulin Detemir (LEVEMIR) 100 UNIT/ML Pen Inject 60 units once daily 45 mL 11  . metoprolol succinate (TOPROL-XL) 50 MG 24 hr tablet TAKE 1 TABLET DAILY WITH OR IMMEDIATELY FOLLOWING A MEAL 90 tablet 1  . olmesartan (BENICAR) 20 MG tablet TAKE 1 TABLET DAILY (DOSE REDUCTION) 90 tablet 1  . ONE TOUCH ULTRA TEST test strip TEST TWICE DAILY 100 each 2  . ONETOUCH DELICA LANCETS MISC Twice daily testing Dx: uncontrolled HTN 100 each 3  . tadalafil (CIALIS) 5 MG tablet TAKE 1 TABLET (5 MG TOTAL) BY MOUTH DAILY AS NEEDED. 30 tablet 1   No current facility-administered medications for this visit.    Discontinued Meds:   There  are no discontinued medications.  Patient Active Problem List   Diagnosis Date Noted  . Bilateral hand pain 04/06/2014  . Back pain 09/16/2013  . Back pain without sciatica 07/08/2013  . Diabetes mellitus, insulin dependent (IDDM), controlled 10/25/2012  . Hypertension associated with stage 2 chronic kidney disease due to type 2 diabetes mellitus   . Hyperlipidemia LDL goal <70   . Obesity (BMI 30.0-34.9) 12/24/2011  . ED (erectile dysfunction) 02/14/2011  . Cardiovascular disease 09/27/2009  . TOBACCO ABUSE, HX OF 09/27/2009    LABS    Component Value Date/Time   NA 139 08/06/2014 0728   NA 138 04/01/2014 1144   NA 140 11/06/2013 0901   K 4.3 08/06/2014 0728   K 4.4 04/01/2014 1144   K 4.4 11/06/2013 0901   CL 106 08/06/2014 0728   CL 102 04/01/2014 1144   CL 103 11/06/2013 0901   CO2 25 08/06/2014 0728   CO2 29 04/01/2014 1144   CO2 27 11/06/2013 0901   GLUCOSE 133* 08/06/2014 0728   GLUCOSE 125* 04/01/2014 1144   GLUCOSE 128* 11/06/2013 0901   BUN 14 08/06/2014 0728   BUN 11 04/01/2014 1144   BUN 11  11/06/2013 0901   CREATININE 1.16 08/06/2014 0728   CREATININE 1.22 04/01/2014 1144   CREATININE 1.22 11/06/2013 0901   CREATININE 1.31 03/14/2010 2150   CREATININE 1.60* 01/11/2010 2317   CREATININE 1.23 06/15/2009 2020   CALCIUM 8.9 08/06/2014 0728   CALCIUM 8.9 04/01/2014 1144   CALCIUM 9.1 11/06/2013 0901   GFRNONAA 67 08/06/2014 0728   GFRNONAA 63 04/01/2014 1144   GFRNONAA 64 11/06/2013 0901   GFRNONAA 9.0 06/15/2009   GFRAA 78 08/06/2014 0728   GFRAA 73 04/01/2014 1144   GFRAA 74 11/06/2013 0901   CMP     Component Value Date/Time   NA 139 08/06/2014 0728   K 4.3 08/06/2014 0728   CL 106 08/06/2014 0728   CO2 25 08/06/2014 0728   GLUCOSE 133* 08/06/2014 0728   BUN 14 08/06/2014 0728   CREATININE 1.16 08/06/2014 0728   CREATININE 1.31 03/14/2010 2150   CALCIUM 8.9 08/06/2014 0728   PROT 7.2 04/01/2014 1144   ALBUMIN 3.9 04/01/2014 1144   AST 18  04/01/2014 1144   ALT 27 04/01/2014 1144   ALKPHOS 136* 04/01/2014 1144   BILITOT 0.5 04/01/2014 1144   GFRNONAA 67 08/06/2014 0728   GFRNONAA 9.0 06/15/2009   GFRAA 78 08/06/2014 0728       Component Value Date/Time   WBC 5.0 11/06/2013 0901   WBC 4.4 10/22/2012 0800   WBC 4.6 02/13/2011 0955   HGB 13.6 11/06/2013 0901   HGB 13.8 10/22/2012 0800   HGB 13.2 02/13/2011 0955   HCT 39.6 11/06/2013 0901   HCT 41.5 10/22/2012 0800   HCT 42.4 02/13/2011 0955   MCV 84.8 11/06/2013 0901   MCV 84.5 10/22/2012 0800   MCV 90.8 02/13/2011 0955    Lipid Panel     Component Value Date/Time   CHOL 114 04/01/2014 1144   TRIG 139 04/01/2014 1144   HDL 35* 04/01/2014 1144   CHOLHDL 3.3 04/01/2014 1144   VLDL 28 04/01/2014 1144   LDLCALC 51 04/01/2014 1144    ABG No results found for: PHART, PCO2ART, PO2ART, HCO3, TCO2, ACIDBASEDEF, O2SAT   Lab Results  Component Value Date   TSH 1.253 08/06/2014   BNP (last 3 results) No results for input(s): BNP in the last 8760 hours.  ProBNP (last 3 results) No results for input(s): PROBNP in the last 8760 hours.  Cardiac Panel (last 3 results) No results for input(s): CKTOTAL, CKMB, TROPONINI, RELINDX in the last 72 hours.  Iron/TIBC/Ferritin/ %Sat No results found for: IRON, TIBC, FERRITIN, IRONPCTSAT   EKG Orders placed or performed in visit on 02/13/14  . EKG 12-Lead     Prior Assessment and Plan Problem List as of 08/21/2014      Cardiovascular and Mediastinum   Cardiovascular disease   Last Assessment & Plan 08/12/2014 Office Visit Written 08/12/2014  9:09 AM by Fayrene Helper, MD    3 month h/o increased exertional fatigue, needs cardiology re eval      Hypertension associated with stage 2 chronic kidney disease due to type 2 diabetes mellitus   Last Assessment & Plan 08/12/2014 Office Visit Written 08/12/2014  9:09 AM by Fayrene Helper, MD    Controlled, no change in medication DASH diet and commitment to daily  physical activity for a minimum of 30 minutes discussed and encouraged, as a part of hypertension management. The importance of attaining a healthy weight is also discussed.  BP/Weight 08/12/2014 04/06/2014 02/13/2014 11/10/2013 08/05/2013 07/08/2013 29/08/6211  Systolic BP 086 578 469  122 - 341 962  Diastolic BP 76 80 78 78 - 74 78  Wt. (Lbs) 221.04 223 225 224.4 219 223.8 227  BMI 32.63 32.92 33.21 33.12 32.33 33.03 33.51              Endocrine   Diabetes mellitus, insulin dependent (IDDM), controlled   Last Assessment & Plan 08/12/2014 Office Visit Written 08/12/2014  9:10 AM by Fayrene Helper, MD    Improvd though still not at goal Increase insulin to 60 units, FBG goal is 90 to 130 also increase exercise when able        Genitourinary   ED (erectile dysfunction)   Last Assessment & Plan 08/12/2014 Office Visit Written 08/15/2014 11:43 PM by Fayrene Helper, MD    Adequate response to cialis , continue same, also assists with symptoms of frequency        Other   TOBACCO ABUSE, HX OF   Obesity (BMI 30.0-34.9)   Last Assessment & Plan 08/12/2014 Office Visit Written 08/12/2014  9:11 AM by Fayrene Helper, MD    Unchanged, c/o bone spur on left great toe. Patient re-educated about  the importance of commitment to a  minimum of 150 minutes of exercise per week.  The importance of healthy food choices with portion control discussed. Encouraged to start a food diary, count calories and to consider  joining a support group. Sample diet sheets offered. Goals set by the patient for the next several months.   Weight /BMI 08/12/2014 04/06/2014 02/13/2014  WEIGHT 221 lb 0.6 oz 223 lb 225 lb  HEIGHT 5\' 9"  5\' 9"  5\' 9"   BMI 32.63 kg/m2 32.92 kg/m2 33.21 kg/m2    Current exercise per week **90* minutes.       Hyperlipidemia LDL goal <70   Last Assessment & Plan 08/12/2014 Office Visit Written 08/12/2014  9:12 AM by Fayrene Helper, MD    Updated lab needed at/ before next  visit. lipitor dose recently increased by cardiology      Back pain without sciatica   Last Assessment & Plan 04/06/2014 Office Visit Written 04/06/2014  9:30 AM by Fayrene Helper, MD    Increased pain and stiffness, not active, pt top commit to daily aerobic activity , use tylenol 500 to 1000 mg as needed, warm compresses and massage      Back pain   Bilateral hand pain   Last Assessment & Plan 04/06/2014 Office Visit Written 04/06/2014  9:31 AM by Fayrene Helper, MD    Worsening bilateral hand pain with joint stiffness, may have minor element of rheumatouid disease, pt concernesd about fibromyalgia, also voices possibility of disability benefit, will have rheumatology evaluate, exam is consistent with osteoarthritis          Imaging: No results found.

## 2014-08-21 NOTE — Progress Notes (Signed)
Cardiology Office Note   Date:  08/21/2014   ID:  Cheikh, Bramble [birth year], MRN [id number]  PCP:  Tula Nakayama, MD  Cardiologist:  Cloria Spring, NP   Chief Complaint  Patient presents with  . Coronary Artery Disease  . Hypertension  . Hyperlipidemia      History of Present Illness: Robert [name] is a 64 y.o. male who presents for ongoing assessment and management of coronary artery disease, hypertension, hyperlipidemia, with history of diabetes.  The patient has a history of drug-eluting stents to the LAD 2003.  His last seen by Dr. Harl Bowie in November of 2015 and was without complaint.  He was changed to high statin, atorvastatin 80 mg, for ongoing treatment of hyperlipidemia.  He is here for follow-up. He states that he has been more tired and had less energy over the last 4-6 months. He plays golf and works around his house, and has noticed that he has to take naps because of fatigue. He had not had to do this before.     He denies chest pain, DOE or dizziness. He is medically compliant. He has recently seen Dr. Moshe Cipro who wanted him re-evaluated for his symptoms.      Past Medical History  Diagnosis Date  . Diabetes mellitus, type II   . Hypertension     Lab  09/2011: Normal CMet ex G-133  . Tobacco abuse, in remission     30 pack years; quit in 1985  . Arteriosclerotic cardiovascular disease (ASCVD)     DES to LAD D1 - 2003 in [place]; failed intervention for a totally obstructed RCA at that time; EF of 45%; 12/2009: Equivocal stress nuclear with good exercise tolerance, negative stress EKG, normal EF with mild mid and distal inferior ischemia  . Hyperlipidemia     Lipid profile in 09/2011:128, 130, 33, 81    Past Surgical History  Procedure Laterality Date  . Pressure ulcer debridement  2006    Right lower extremity  . Epididymis surgery  1996  . Knee arthroscopy w/ meniscectomy  11/2008    Right  . Rotator cuff repair  07/2009    Right  .  Colonoscopy  2010    Negative screening study     Current Outpatient Prescriptions  Medication Sig Dispense Refill  . aspirin 81 MG tablet Take 81 mg by mouth daily.      Marland Kitchen atorvastatin (LIPITOR) 80 MG tablet Take 1 tablet (80 mg total) by mouth daily. 90 tablet 3  . B-D ULTRAFINE III SHORT PEN 31G X 8 MM MISC USE AS DIRECTED WITH LEVEMIR FLEXPEN 100 each 4  . betamethasone valerate (VALISONE) 0.1 % cream Apply 1 application topically daily. 30 g 0  . glipiZIDE (GLUCOTROL) 10 MG tablet TAKE 1 TABLET TWICE A DAY BEFORE MEALS 180 tablet 1  . Insulin Detemir (LEVEMIR) 100 UNIT/ML Pen Inject 60 units once daily 45 mL 11  . metoprolol succinate (TOPROL-XL) 50 MG 24 hr tablet TAKE 1 TABLET DAILY WITH OR IMMEDIATELY FOLLOWING A MEAL 90 tablet 1  . olmesartan (BENICAR) 20 MG tablet TAKE 1 TABLET DAILY (DOSE REDUCTION) 90 tablet 1  . ONE TOUCH ULTRA TEST test strip TEST TWICE DAILY 100 each 2  . ONETOUCH DELICA LANCETS MISC Twice daily testing Dx: uncontrolled HTN 100 each 3  . tadalafil (CIALIS) 5 MG tablet TAKE 1 TABLET (5 MG TOTAL) BY MOUTH DAILY AS NEEDED. 30 tablet 1   No current facility-administered medications for this visit.  Allergies:   Rosuvastatin    Social History:  The patient  reports that he quit smoking about 30 years ago. His smoking use included Cigarettes. He started smoking about 48 years ago. He has a 30 pack-year smoking history. He has never used smokeless tobacco. He reports that he does not drink alcohol.   Family History:  The patient's family history includes Arthritis in his brother; Cancer in his father; Diabetes in his brother and sister.    ROS: .   All other systems are reviewed and negative.Unless otherwise mentioned in H&P above.   PHYSICAL EXAM: VS:  BP 128/82 mmHg  Pulse 82  Ht 5\' 9"  (1.753 m)  Wt 226 lb (102.513 kg)  BMI 33.36 kg/m2  SpO2 96% , BMI Body mass index is 33.36 kg/(m^2). GEN: Well nourished, well developed, in no acute  distress HEENT: normal Neck: no JVD, carotid bruits, or masses Cardiac: RRR; no murmurs, rubs, or gallops,no edema  Respiratory:  clear to auscultation bilaterally, normal work of breathing GI: soft, nontender, nondistended, + BS MS: no deformity or atrophy Skin: warm and dry, no rash Neuro:  Strength and sensation are intact Psych: euthymic mood, full affect   EKG:   The ekg ordered today demonstrates NSR with incomplete RBBB, anterior infarct.    Recent Labs: 11/06/2013: Hemoglobin 13.6; Platelets 244 04/01/2014: ALT 27 08/06/2014: BUN 14; Creatinine 1.16; Potassium 4.3; Sodium 139; TSH 1.253    Lipid Panel    Component Value Date/Time   CHOL 114 04/01/2014 1144   TRIG 139 04/01/2014 1144   HDL 35* 04/01/2014 1144   CHOLHDL 3.3 04/01/2014 1144   VLDL 28 04/01/2014 1144   LDLCALC 51 04/01/2014 1144      Wt Readings from Last 3 Encounters:  08/21/14 226 lb (102.513 kg)  08/12/14 221 lb 0.6 oz (100.263 kg)  04/06/14 223 lb (101.152 kg)      Other studies Reviewed: Additional studies/ records that were reviewed today include: None. Review of the above records demonstrates: N/A   ASSESSMENT AND PLAN:  1.  CAD:  He has DES to LAD placed in 2003. He has been symptomatic with more fatigue for the last 4-6 months. Denies chest pain, increased dyspnea. He will be scheduled for a cardiolite stress test for evaluation of worsening ischemia.   2. Hypertension:  BP is well controlled. No changes in medications.   3. Hyperlipidemia: Labs are followed by Dr. Moshe Cipro.,   Current medicines are reviewed at length with the patient today.    Labs/ tests ordered today include: Cardiolite Stress Test  No orders of the defined types were placed in this encounter.     Disposition:   FU with cardiology post stress test.    Signed, Jory Sims, NP  08/21/2014 2:18 PM    [facility] [address] [place], [place] [address] Phone: [phone];  Fax: [phone]

## 2014-08-27 ENCOUNTER — Encounter (HOSPITAL_COMMUNITY)
Admission: RE | Admit: 2014-08-27 | Discharge: 2014-08-27 | Disposition: A | Discharge status: Home / Self Care | Payer: BLUE CROSS/BLUE SHIELD | Source: Ambulatory Visit | Attending: Adult Health | Admitting: Adult Health

## 2014-08-27 ENCOUNTER — Encounter (HOSPITAL_COMMUNITY): Payer: Self-pay

## 2014-08-27 ENCOUNTER — Inpatient Hospital Stay (HOSPITAL_COMMUNITY): Admission: RE | Admit: 2014-08-27 | Payer: BC Managed Care – PPO | Source: Ambulatory Visit

## 2014-08-27 DIAGNOSIS — I2510 Atherosclerotic heart disease of native coronary artery without angina pectoris: Secondary | ICD-10-CM | POA: Insufficient documentation

## 2014-08-27 LAB — NM MYOCAR MULTI W/SPECT W/WALL MOTION / EF
CHL CUP RESTING HR STRESS: 67 {beats}/min
CSEPPHR: 150 {beats}/min
Estimated workload: 10.1 METS
Exercise duration (min): 7 min
Exercise duration (sec): 9 s
LV dias vol: 87 mL
LV sys vol: 35 mL
MPHR: 158 {beats}/min
Nuc Stress EF: 60 %
Percent HR: 94 %
SDS: 5
SRS: 0
SSS: 5
TID: 1.03

## 2014-08-27 MED ORDER — REGADENOSON 0.4 MG/5ML IV SOLN
0.4000 mg | Freq: Once | INTRAVENOUS | Status: AC | PRN
  Filled 2014-08-27: qty 5

## 2014-08-27 MED ORDER — TECHNETIUM TC 99M SESTAMIBI GENERIC - CARDIOLITE
30.0000 | Freq: Once | INTRAVENOUS | Status: AC | PRN
  Administered 2014-08-27: 30 via INTRAVENOUS

## 2014-08-27 MED ORDER — SODIUM CHLORIDE 0.9 % IJ SOLN
10.0000 mL | INTRAMUSCULAR | Status: DC | PRN
  Administered 2014-08-27: 10 mL via INTRAVENOUS
  Filled 2014-08-27: qty 10

## 2014-08-27 MED ORDER — TECHNETIUM TC 99M SESTAMIBI - CARDIOLITE
10.0000 | Freq: Once | INTRAVENOUS | Status: AC | PRN
  Administered 2014-08-27: 08:00:00 9 via INTRAVENOUS

## 2014-08-27 MED ORDER — SODIUM CHLORIDE 0.9 % IJ SOLN
INTRAMUSCULAR | Status: AC
  Administered 2014-08-27: 10 mL via INTRAVENOUS
  Filled 2014-08-27: qty 3

## 2014-08-27 MED ORDER — REGADENOSON 0.4 MG/5ML IV SOLN
INTRAVENOUS | Status: AC
  Filled 2014-08-27: qty 5

## 2014-09-29 ENCOUNTER — Telehealth (INDEPENDENT_AMBULATORY_CARE_PROVIDER_SITE_OTHER): Payer: Self-pay | Admitting: *Deleted

## 2014-09-29 MED ORDER — PEG 3350-KCL-NA BICARB-NACL 420 G PO SOLR: 4000.0000 mL | Freq: Once | ORAL | Status: DC

## 2014-09-29 NOTE — Telephone Encounter (Signed)
Patient needs trilyte 

## 2014-10-19 ENCOUNTER — Telehealth (INDEPENDENT_AMBULATORY_CARE_PROVIDER_SITE_OTHER): Payer: Self-pay | Admitting: *Deleted

## 2014-10-19 NOTE — Telephone Encounter (Signed)
Referring MD/PCP: simpson   Procedure: tcs  Reason/Indication:  screening  Has patient had this procedure before?  Yes, 2006 -- epic  If so, when, by whom and where?    Is there a family history of colon cancer?  no  Who?  What age when diagnosed?    Is patient diabetic?   yes      Does patient have prosthetic heart valve?  no  Do you have a pacemaker?  no  Has patient ever had endocarditis? no  Has patient had joint replacement within last 12 months?  no  Does patient tend to be constipated or take laxatives? no  Is patient on Coumadin, Plavix and/or Aspirin? yes  Medications: asa 81 mg daily, atorvastatin 80 mg daily, benicar 20 mg daily, betamethasone valerate 0.1 cream, cialis 5 mg prn, glipizide 10 mg bid, insulin 60 units in am, metoprolol 50 mg daily, tylenol 500 mg prn  Allergies: strong pain meds  Medication Adjustment: asa 2 days  Procedure date & time: 11/11/14 at 1030

## 2014-10-21 NOTE — Telephone Encounter (Signed)
agree

## 2014-11-11 ENCOUNTER — Ambulatory Visit (HOSPITAL_COMMUNITY)
Admission: RE | Admit: 2014-11-11 | Discharge: 2014-11-11 | Disposition: A | Discharge status: Home / Self Care | Payer: BLUE CROSS/BLUE SHIELD | Source: Ambulatory Visit | Attending: Internal Medicine | Admitting: Internal Medicine

## 2014-11-11 ENCOUNTER — Encounter (HOSPITAL_COMMUNITY)
Admission: RE | Disposition: A | Discharge status: Home / Self Care | Payer: Self-pay | Source: Ambulatory Visit | Attending: Internal Medicine

## 2014-11-11 ENCOUNTER — Encounter (HOSPITAL_COMMUNITY): Payer: Self-pay | Admitting: *Deleted

## 2014-11-11 DIAGNOSIS — I2510 Atherosclerotic heart disease of native coronary artery without angina pectoris: Secondary | ICD-10-CM | POA: Insufficient documentation

## 2014-11-11 DIAGNOSIS — Z1211 Encounter for screening for malignant neoplasm of colon: Secondary | ICD-10-CM | POA: Diagnosis not present

## 2014-11-11 DIAGNOSIS — K921 Melena: Secondary | ICD-10-CM | POA: Diagnosis not present

## 2014-11-11 DIAGNOSIS — M79641 Pain in right hand: Secondary | ICD-10-CM | POA: Insufficient documentation

## 2014-11-11 DIAGNOSIS — E785 Hyperlipidemia, unspecified: Secondary | ICD-10-CM | POA: Insufficient documentation

## 2014-11-11 DIAGNOSIS — Z87891 Personal history of nicotine dependence: Secondary | ICD-10-CM | POA: Diagnosis not present

## 2014-11-11 DIAGNOSIS — K5730 Diverticulosis of large intestine without perforation or abscess without bleeding: Secondary | ICD-10-CM | POA: Insufficient documentation

## 2014-11-11 DIAGNOSIS — Z807 Family history of other malignant neoplasms of lymphoid, hematopoietic and related tissues: Secondary | ICD-10-CM | POA: Diagnosis not present

## 2014-11-11 DIAGNOSIS — Z7982 Long term (current) use of aspirin: Secondary | ICD-10-CM | POA: Insufficient documentation

## 2014-11-11 DIAGNOSIS — Z6833 Body mass index (BMI) 33.0-33.9, adult: Secondary | ICD-10-CM | POA: Insufficient documentation

## 2014-11-11 DIAGNOSIS — D125 Benign neoplasm of sigmoid colon: Secondary | ICD-10-CM | POA: Diagnosis not present

## 2014-11-11 DIAGNOSIS — Z888 Allergy status to other drugs, medicaments and biological substances status: Secondary | ICD-10-CM | POA: Diagnosis not present

## 2014-11-11 DIAGNOSIS — E119 Type 2 diabetes mellitus without complications: Secondary | ICD-10-CM | POA: Insufficient documentation

## 2014-11-11 DIAGNOSIS — K648 Other hemorrhoids: Secondary | ICD-10-CM | POA: Diagnosis not present

## 2014-11-11 DIAGNOSIS — Z794 Long term (current) use of insulin: Secondary | ICD-10-CM | POA: Insufficient documentation

## 2014-11-11 DIAGNOSIS — M79642 Pain in left hand: Secondary | ICD-10-CM | POA: Diagnosis not present

## 2014-11-11 DIAGNOSIS — N529 Male erectile dysfunction, unspecified: Secondary | ICD-10-CM | POA: Diagnosis not present

## 2014-11-11 DIAGNOSIS — E669 Obesity, unspecified: Secondary | ICD-10-CM | POA: Diagnosis not present

## 2014-11-11 DIAGNOSIS — I10 Essential (primary) hypertension: Secondary | ICD-10-CM | POA: Diagnosis not present

## 2014-11-11 HISTORY — DX: Atherosclerotic heart disease of native coronary artery without angina pectoris: I25.10

## 2014-11-11 HISTORY — PX: COLONOSCOPY: SHX5424

## 2014-11-11 LAB — GLUCOSE, CAPILLARY: GLUCOSE-CAPILLARY: 137 mg/dL — AB (ref 65–99)

## 2014-11-11 LAB — HM COLONOSCOPY

## 2014-11-11 SURGERY — COLONOSCOPY
Anesthesia: Moderate Sedation

## 2014-11-11 MED ORDER — SODIUM CHLORIDE 0.9 % IV SOLN
INTRAVENOUS | Status: DC
  Administered 2014-11-11: 1000 mL via INTRAVENOUS

## 2014-11-11 MED ORDER — STERILE WATER FOR IRRIGATION IR SOLN
Status: DC | PRN
  Administered 2014-11-11: 10:00:00

## 2014-11-11 MED ORDER — MIDAZOLAM HCL 5 MG/5ML IJ SOLN
INTRAMUSCULAR | Status: AC
  Filled 2014-11-11: qty 10

## 2014-11-11 MED ORDER — MEPERIDINE HCL 50 MG/ML IJ SOLN
INTRAMUSCULAR | Status: AC
  Filled 2014-11-11: qty 1

## 2014-11-11 MED ORDER — MEPERIDINE HCL 50 MG/ML IJ SOLN
INTRAMUSCULAR | Status: DC | PRN
  Administered 2014-11-11 (×2): 25 mg via INTRAVENOUS

## 2014-11-11 MED ORDER — MIDAZOLAM HCL 5 MG/5ML IJ SOLN
INTRAMUSCULAR | Status: DC | PRN
  Administered 2014-11-11: 1 mg via INTRAVENOUS
  Administered 2014-11-11 (×2): 2 mg via INTRAVENOUS

## 2014-11-11 NOTE — H&P (Signed)
Robert [name] is an 64 y.o. male.   Chief Complaint: Patient is here for colonoscopy. HPI: Patient is 64 year old African-American male was here for screening colonoscopy. He denies abdominal pain change in bowel was a frank rectal bleeding. He has occasional hematochezia felt to be secondary to hemorrhoids. Last colonoscopy was in May 2006 revealing mild sigmoid diverticulosis and hemorrhoids. History is negative for CRC.  Past Medical History  Diagnosis Date  . Diabetes mellitus, type II   . Hypertension     Lab  09/2011: Normal CMet ex G-[id number]  . Tobacco abuse, in remission     30 pack years; quit in 1985  . Arteriosclerotic cardiovascular disease (ASCVD)     DES to LAD D1 - 2003 in [place]; failed intervention for a totally obstructed RCA at that time; EF of 45%; 12/2009: Equivocal stress nuclear with good exercise tolerance, negative stress EKG, normal EF with mild mid and distal inferior ischemia  . Hyperlipidemia     Lipid profile in 09/2011:128, 130, 33, 69  . Coronary artery disease     Past Surgical History  Procedure Laterality Date  . Pressure ulcer debridement  2006    Right lower extremity  . Epididymis surgery  1996  . Knee arthroscopy w/ meniscectomy  11/2008    Right  . Rotator cuff repair  07/2009    Right  . Colonoscopy  2010    Negative screening study  . Cardiac catheterization      Family History  Problem Relation Age of Onset  . Diabetes Sister   . Arthritis Brother   . Diabetes Brother   . Cancer Father     Hypernephroma   Social History:  reports that he quit smoking about 31 years ago. His smoking use included Cigarettes. He started smoking about 48 years ago. He has a 30 pack-year smoking history. He has never used smokeless tobacco. He reports that he does not drink alcohol or use illicit drugs.  Allergies:  Allergies  Allergen Reactions  . Rosuvastatin Other (See Comments)    Adverse GI symptoms    Medications Prior to Admission  Medication  Sig Dispense Refill  . acetaminophen (TYLENOL) 500 MG tablet Take 1,000 mg by mouth every 6 (six) hours as needed for mild pain.    Marland Kitchen aspirin 81 MG tablet Take 81 mg by mouth daily.      Marland Kitchen atorvastatin (LIPITOR) 80 MG tablet Take 1 tablet (80 mg total) by mouth daily. 90 tablet 3  . glipiZIDE (GLUCOTROL) 10 MG tablet TAKE 1 TABLET TWICE A DAY BEFORE MEALS 180 tablet 1  . Insulin Detemir (LEVEMIR) 100 UNIT/ML Pen Inject 60 units once daily (Patient taking differently: Inject 65 Units into the skin daily. ) 45 mL 11  . metoprolol succinate (TOPROL-XL) 50 MG 24 hr tablet TAKE 1 TABLET DAILY WITH OR IMMEDIATELY FOLLOWING A MEAL 90 tablet 1  . olmesartan (BENICAR) 20 MG tablet TAKE 1 TABLET DAILY (DOSE REDUCTION) 90 tablet 1  . tadalafil (CIALIS) 5 MG tablet TAKE 1 TABLET (5 MG TOTAL) BY MOUTH DAILY AS NEEDED. 30 tablet 1  . betamethasone valerate (VALISONE) 0.1 % cream Apply 1 application topically daily. 30 g 0    No results found for this or any previous visit (from the past 48 hour(s)). No results found.  ROS  Blood pressure 131/75, pulse 72, temperature 98.9 F (37.2 C), temperature source Oral, resp. rate 19, height 5\' 9"  (1.753 m), weight 226 lb (102.513 kg), SpO2 98 %.  Physical Exam  Constitutional: He appears well-developed and well-nourished.  HENT:  Mouth/Throat: Oropharynx is clear and moist.  Eyes: Conjunctivae are normal. No scleral icterus.  Neck: No thyromegaly present.  Cardiovascular: Normal rate, regular rhythm and normal heart sounds.   No murmur heard. Respiratory: Effort normal and breath sounds normal.  GI: Soft. He exhibits no distension and no mass. There is no tenderness.  Small likely hernia and scar above right inguinal ligament.  Musculoskeletal: He exhibits no edema.  Lymphadenopathy:    He has no cervical adenopathy.  Neurological: He is alert.  Skin: Skin is warm and dry.     Assessment/Plan Average risk screening colonoscopy.  REHMAN,NAJEEB  U 11/11/2014, 10:09 AM

## 2014-11-11 NOTE — Discharge Instructions (Signed)
Resume usual medications and high fiber diet. No driving for 24 hours. Physician will call with biopsy results.     Colonoscopy, Care After These instructions give you information on caring for yourself after your procedure. Your doctor may also give you more specific instructions. Call your doctor if you have any problems or questions after your procedure. HOME CARE  Do not drive for 24 hours.  Do not sign important papers or use machinery for 24 hours.  You may shower.  You may go back to your usual activities, but go slower for the first 24 hours.  Take rest breaks often during the first 24 hours.  Walk around or use warm packs on your belly (abdomen) if you have belly cramping or gas.  Drink enough fluids to keep your pee (urine) clear or pale yellow.  Resume your normal diet. Avoid heavy or fried foods.  Avoid drinking alcohol for 24 hours or as told by your doctor.  Only take medicines as told by your doctor. If a tissue sample (biopsy) was taken during the procedure:   Do not take aspirin or blood thinners for 7 days, or as told by your doctor.  Do not drink alcohol for 7 days, or as told by your doctor.  Eat soft foods for the first 24 hours. GET HELP IF: You still have a small amount of blood in your poop (stool) 2-3 days after the procedure. GET HELP RIGHT AWAY IF:  You have more than a small amount of blood in your poop.  You see clumps of tissue (blood clots) in your poop.  Your belly is puffy (swollen).  You feel sick to your stomach (nauseous) or throw up (vomit).  You have a fever.  You have belly pain that gets worse and medicine does not help. MAKE SURE YOU:  Understand these instructions.  Will watch your condition.  Will get help right away if you are not doing well or get worse. Document Released: 04/22/2010 Document Revised: 03/25/2013 Document Reviewed: 11/25/2012 [facility] Patient Information 2015 [place], [place]. This information is  not intended to replace advice given to you by your health care provider. Make sure you discuss any questions you have with your health care provider.  High-Fiber Diet Fiber is found in fruits, vegetables, and grains. A high-fiber diet encourages the addition of more whole grains, legumes, fruits, and vegetables in your diet. The recommended amount of fiber for adult males is 38 g per day. For adult females, it is 25 g per day. Pregnant and lactating women should get 28 g of fiber per day. If you have a digestive or bowel problem, ask your caregiver for advice before adding high-fiber foods to your diet. Eat a variety of high-fiber foods instead of only a select few type of foods.  PURPOSE  To increase stool bulk.  To make bowel movements more regular to prevent constipation.  To lower cholesterol.  To prevent overeating. WHEN IS THIS DIET USED?  It may be used if you have constipation and hemorrhoids.  It may be used if you have uncomplicated diverticulosis (intestine condition) and irritable bowel syndrome.  It may be used if you need help with weight management.  It may be used if you want to add it to your diet as a protective measure against atherosclerosis, diabetes, and cancer. SOURCES OF FIBER  Whole-grain breads and cereals.  Fruits, such as apples, oranges, bananas, berries, prunes, and pears.  Vegetables, such as green peas, carrots, sweet potatoes, beets,  broccoli, cabbage, spinach, and artichokes.  Legumes, such split peas, soy, lentils.  Almonds. FIBER CONTENT IN FOODS Starches and Grains / Dietary Fiber (g)  Cheerios, 1 cup / 3 g  Corn Flakes cereal, 1 cup / 0.7 g  Rice crispy treat cereal, 1 cup / 0.3 g  Instant oatmeal (cooked),  cup / 2 g  Frosted wheat cereal, 1 cup / 5.1 g  Brown, long-grain rice (cooked), 1 cup / 3.5 g  White, long-grain rice (cooked), 1 cup / 0.6 g  Enriched macaroni (cooked), 1 cup / 2.5 g Legumes / Dietary Fiber  (g)  Baked beans (canned, plain, or vegetarian),  cup / 5.2 g  Kidney beans (canned),  cup / 6.8 g  Pinto beans (cooked),  cup / 5.5 g Breads and Crackers / Dietary Fiber (g)  Plain or honey graham crackers, 2 squares / 0.7 g  Saltine crackers, 3 squares / 0.3 g  Plain, salted pretzels, 10 pieces / 1.8 g  Whole-wheat bread, 1 slice / 1.9 g  White bread, 1 slice / 0.7 g  Raisin bread, 1 slice / 1.2 g  Plain bagel, 3 oz / 2 g  Flour tortilla, 1 oz / 0.9 g  Corn tortilla, 1 small / 1.5 g  Hamburger or hotdog bun, 1 small / 0.9 g Fruits / Dietary Fiber (g)  Apple with skin, 1 medium / 4.4 g  Sweetened applesauce,  cup / 1.5 g  Banana,  medium / 1.5 g  Grapes, 10 grapes / 0.4 g  Orange, 1 small / 2.3 g  Raisin, 1.5 oz / 1.6 g  Melon, 1 cup / 1.4 g Vegetables / Dietary Fiber (g)  Green beans (canned),  cup / 1.3 g  Carrots (cooked),  cup / 2.3 g  Broccoli (cooked),  cup / 2.8 g  Peas (cooked),  cup / 4.4 g  Mashed potatoes,  cup / 1.6 g  Lettuce, 1 cup / 0.5 g  Corn (canned),  cup / 1.6 g  Tomato,  cup / 1.1 g Document Released: 03/20/2005 Document Revised: 09/19/2011 Document Reviewed: 06/22/2011 [employer] Patient Information 2015 [place], [place]. This information is not intended to replace advice given to you by your health care provider. Make sure you discuss any questions you have with your health care provider.  Diverticulosis Diverticulosis is the condition that develops when small pouches (diverticula) form in the wall of your colon. Your colon, or large intestine, is where water is absorbed and stool is formed. The pouches form when the inside layer of your colon pushes through weak spots in the outer layers of your colon. CAUSES  No one knows exactly what causes diverticulosis. RISK FACTORS  Being older than 65. Your risk for this condition increases with age. Diverticulosis is rare in people younger than 40 years. By age 52, almost  everyone has it.  Eating a low-fiber diet.  Being frequently constipated.  Being overweight.  Not getting enough exercise.  Smoking.  Taking over-the-counter pain medicines, like aspirin and ibuprofen. SYMPTOMS  Most people with diverticulosis do not have symptoms. DIAGNOSIS  Because diverticulosis often has no symptoms, health care providers often discover the condition during an exam for other colon problems. In many cases, a health care provider will diagnose diverticulosis while using a flexible scope to examine the colon (colonoscopy). TREATMENT  If you have never developed an infection related to diverticulosis, you may not need treatment. If you have had an infection before, treatment may include:  Eating  more fruits, vegetables, and grains.  Taking a fiber supplement.  Taking a live bacteria supplement (probiotic).  Taking medicine to relax your colon. HOME CARE INSTRUCTIONS   Drink at least 6-8 glasses of water each day to prevent constipation.  Try not to strain when you have a bowel movement.  Keep all follow-up appointments. If you have had an infection before:  Increase the fiber in your diet as directed by your health care provider or dietitian.  Take a dietary fiber supplement if your health care provider approves.  Only take medicines as directed by your health care provider. SEEK MEDICAL CARE IF:   You have abdominal pain.  You have bloating.  You have cramps.  You have not gone to the bathroom in 3 days. SEEK IMMEDIATE MEDICAL CARE IF:   Your pain gets worse.  Yourbloating becomes very bad.  You have a fever or chills, and your symptoms suddenly get worse.  You begin vomiting.  You have bowel movements that are bloody or black. MAKE SURE YOU:  Understand these instructions.  Will watch your condition.  Will get help right away if you are not doing well or get worse. Document Released: 12/16/2003 Document Revised: 03/25/2013  Document Reviewed: 02/12/2013 [facility] Patient Information 2015 [place], [place]. This information is not intended to replace advice given to you by your health care provider. Make sure you discuss any questions you have with your health care provider.

## 2014-11-11 NOTE — Op Note (Signed)
COLONOSCOPY PROCEDURE REPORT  PATIENT:  Robert [name]  MR#:  [id number] Birthdate:  01/29/1951, 64 y.o., male Endoscopist:  Dr. Rogene Houston, MD Referred By:  Dr. Tula Nakayama, MD  Procedure Date: 11/11/2014  Procedure:   Colonoscopy  Indications:  Patient is 64 year old African-American male was undergoing average risk would need colonoscopy. His last exam was in May 2006.  Informed Consent:  The procedure and risks were reviewed with the patient and informed consent was obtained.  Medications:  Demerol 50 mg IV Versed 5 mg IV  Description of procedure:  After a digital rectal exam was performed, that colonoscope was advanced from the anus through the rectum and colon to the area of the cecum, ileocecal valve and appendiceal orifice. The cecum was deeply intubated. These structures were well-seen and photographed for the record. From the level of the cecum and ileocecal valve, the scope was slowly and cautiously withdrawn. The mucosal surfaces were carefully surveyed utilizing scope tip to flexion to facilitate fold flattening as needed. The scope was pulled down into the rectum where a thorough exam including retroflexion was performed.  Findings:   Prep excellent. Normal mucosa of cecum, ascending colon, hepatic flexure, transverse colon, splenic flexure and descending colon. Few small diverticula at sigmoid colon. 4 mm polyp cold snare from sigmoid colon. Normal rectal mucosa. Small hemorrhoids above the dentate line.   Therapeutic/Diagnostic Maneuvers Performed:  See above  Complications:  None  Cecal Withdrawal Time:  13 minutes  Impression:  Examination performed to cecum. Few small diverticula and sigmoid colon. Small polyp cold snare from sigmoid colon. Internal hemorrhoids.  Recommendations:  Standard instructions given. High fiber diet. I will contact patient with biopsy results and further recommendations.  Cailey Trigueros U  11/11/2014 10:44 AM  CC:  Dr. Tula Nakayama, MD & Dr. Rayne Du ref. provider found

## 2014-11-13 ENCOUNTER — Encounter: Payer: Self-pay | Admitting: Family Medicine

## 2014-11-13 DIAGNOSIS — D126 Benign neoplasm of colon, unspecified: Secondary | ICD-10-CM | POA: Insufficient documentation

## 2014-11-16 ENCOUNTER — Encounter (HOSPITAL_COMMUNITY): Payer: Self-pay | Admitting: Internal Medicine

## 2014-12-03 ENCOUNTER — Other Ambulatory Visit: Payer: Self-pay | Admitting: Family Medicine

## 2014-12-31 ENCOUNTER — Other Ambulatory Visit: Payer: Self-pay | Admitting: Family Medicine

## 2014-12-31 LAB — CBC
HCT: 40.7 % (ref 39.0–52.0)
Hemoglobin: 13.4 g/dL (ref 13.0–17.0)
MCH: 29 pg (ref 26.0–34.0)
MCHC: 32.9 g/dL (ref 30.0–36.0)
MCV: 88.1 fL (ref 78.0–100.0)
MPV: 10.1 fL (ref 8.6–12.4)
Platelets: 250 10*3/uL (ref 150–400)
RBC: 4.62 MIL/uL (ref 4.22–5.81)
RDW: 15.2 % (ref 11.5–15.5)
WBC: 6.4 10*3/uL (ref 4.0–10.5)

## 2015-01-01 LAB — COMPLETE METABOLIC PANEL WITH GFR
ALT: 38 U/L (ref 9–46)
AST: 23 U/L (ref 10–35)
Albumin: 3.8 g/dL (ref 3.6–5.1)
Alkaline Phosphatase: 121 U/L — ABNORMAL HIGH (ref 40–115)
BUN: 10 mg/dL (ref 7–25)
CALCIUM: 8.9 mg/dL (ref 8.6–10.3)
CHLORIDE: 104 mmol/L (ref 98–110)
CO2: 27 mmol/L (ref 20–31)
Creat: 1.16 mg/dL (ref 0.70–1.25)
GFR, EST AFRICAN AMERICAN: 78 mL/min (ref >=60)
GFR, Est Non African American: 67 mL/min (ref >=60)
Glucose, Bld: 133 mg/dL — ABNORMAL HIGH (ref 65–99)
Potassium: 4.5 mmol/L (ref 3.5–5.3)
Sodium: 139 mmol/L (ref 135–146)
Total Bilirubin: 0.3 mg/dL (ref 0.2–1.2)
Total Protein: 6.4 g/dL (ref 6.1–8.1)

## 2015-01-01 LAB — LIPID PANEL
CHOL/HDL RATIO: 3.7 ratio (ref <=5.0)
CHOLESTEROL: 112 mg/dL — AB (ref 125–200)
HDL: 30 mg/dL — ABNORMAL LOW (ref >=40)
LDL Cholesterol: 49 mg/dL (ref <130)
Triglycerides: 164 mg/dL — ABNORMAL HIGH (ref <150)
VLDL: 33 mg/dL — AB (ref <30)

## 2015-01-01 LAB — MICROALBUMIN / CREATININE URINE RATIO
Creatinine, Urine: 245.9 mg/dL
MICROALB/CREAT RATIO: 10.2 mg/g (ref 0.0–30.0)
Microalb, Ur: 2.5 mg/dL — ABNORMAL HIGH (ref <2.0)

## 2015-01-01 LAB — HEMOGLOBIN A1C
Hgb A1c MFr Bld: 7.7 % — ABNORMAL HIGH (ref <5.7)
MEAN PLASMA GLUCOSE: 174 mg/dL — AB (ref <117)

## 2015-01-04 ENCOUNTER — Other Ambulatory Visit: Payer: Self-pay

## 2015-01-04 ENCOUNTER — Encounter: Payer: Self-pay | Admitting: Family Medicine

## 2015-01-04 ENCOUNTER — Ambulatory Visit (INDEPENDENT_AMBULATORY_CARE_PROVIDER_SITE_OTHER): Payer: BLUE CROSS/BLUE SHIELD | Admitting: Family Medicine

## 2015-01-04 VITALS — BP 120/82 | HR 69 | Resp 16 | Ht 69.0 in | Wt 222.8 lb

## 2015-01-04 DIAGNOSIS — I129 Hypertensive chronic kidney disease with stage 1 through stage 4 chronic kidney disease, or unspecified chronic kidney disease: Secondary | ICD-10-CM

## 2015-01-04 DIAGNOSIS — E785 Hyperlipidemia, unspecified: Secondary | ICD-10-CM | POA: Diagnosis not present

## 2015-01-04 DIAGNOSIS — M5489 Other dorsalgia: Secondary | ICD-10-CM

## 2015-01-04 DIAGNOSIS — J302 Other seasonal allergic rhinitis: Secondary | ICD-10-CM | POA: Diagnosis not present

## 2015-01-04 DIAGNOSIS — E669 Obesity, unspecified: Secondary | ICD-10-CM

## 2015-01-04 DIAGNOSIS — E119 Type 2 diabetes mellitus without complications: Secondary | ICD-10-CM

## 2015-01-04 DIAGNOSIS — E1122 Type 2 diabetes mellitus with diabetic chronic kidney disease: Secondary | ICD-10-CM

## 2015-01-04 DIAGNOSIS — IMO0001 Reserved for inherently not codable concepts without codable children: Secondary | ICD-10-CM

## 2015-01-04 DIAGNOSIS — Z794 Long term (current) use of insulin: Principal | ICD-10-CM

## 2015-01-04 DIAGNOSIS — N182 Chronic kidney disease, stage 2 (mild): Secondary | ICD-10-CM

## 2015-01-04 DIAGNOSIS — I2510 Atherosclerotic heart disease of native coronary artery without angina pectoris: Secondary | ICD-10-CM

## 2015-01-04 LAB — HEPATITIS C ANTIBODY: HCV AB: NEGATIVE

## 2015-01-04 LAB — HIV ANTIBODY (ROUTINE TESTING W REFLEX): HIV 1&2 Ab, 4th Generation: NONREACTIVE

## 2015-01-04 MED ORDER — GLIPIZIDE 10 MG PO TABS: ORAL_TABLET | ORAL | Status: DC

## 2015-01-04 MED ORDER — ATORVASTATIN CALCIUM 40 MG PO TABS: 40.0000 mg | ORAL_TABLET | Freq: Every day | ORAL | Status: DC

## 2015-01-04 MED ORDER — INSULIN DETEMIR 100 UNIT/ML FLEXPEN: PEN_INJECTOR | SUBCUTANEOUS | Status: DC

## 2015-01-04 MED ORDER — INSULIN PEN NEEDLE 31G X 8 MM MISC: Status: DC

## 2015-01-04 MED ORDER — OLMESARTAN MEDOXOMIL 20 MG PO TABS
ORAL_TABLET | ORAL | Status: DC
Start: 2015-01-04 — End: 2015-08-13

## 2015-01-04 MED ORDER — GLUCOSE BLOOD VI STRP: ORAL_STRIP | Status: DC

## 2015-01-04 NOTE — Assessment & Plan Note (Signed)
Recent flare x 3 weeks following excess crawling, responding to time and tylenol, continue same

## 2015-01-04 NOTE — Assessment & Plan Note (Addendum)
1 week history of increased symptoms, recommend starting OTC claritin

## 2015-01-04 NOTE — Progress Notes (Signed)
Subjective:    Patient ID: Robert [name], male    DOB: [birth year], 64 y.o.   MRN: [id number]  HPI   Robert [name]     MRN: [id number]      DOB: 04/10/1950   HPI Robert [name] is here for follow up and re-evaluation of chronic medical conditions, medication management and review of any available recent lab and radiology data.  Preventive health is updated, specifically  Cancer screening and Immunization.   Questions or concerns regarding consultations or procedures which the PT has had in the interim are  addressed. The PT denies any adverse reactions to current medications since the last visit.  1 week h/o increased sinus pressure with  Post nasal drainage and cough, also left ear pressure C/o red raised lesion under left breast and also swelling , painless on right ring finger  ROS Denies recent fever or chills.  Denies chest pains, palpitations and leg swelling Denies abdominal pain, nausea, vomiting,diarrhea or constipation.   Denies dysuria, frequency, hesitancy or incontinence. Denies joint pain, swelling and limitation in mobility. Denies headaches, seizures, numbness, or tingling. Denies depression, anxiety or insomnia.  PE  BP 120/82 mmHg  Pulse 69  Resp 16  Ht 5\' 9"  (1.753 m)  Wt 222 lb 12.8 oz (101.061 kg)  BMI 32.89 kg/m2  SpO2 97%  Patient alert and oriented and in no cardiopulmonary distress.  HEENT: No facial asymmetry, EOMI,   oropharynx pink and moist.  Neck supple no JVD, no mass.  Chest: Clear to auscultation bilaterally.  CVS: S1, S2 no murmurs, no S3.Regular rate.  ABD: Soft non tender.   Ext: No edema  MS: Adequate ROM spine, shoulders, hips and knees.  Skin: Intact, fungal infection between 4th and 5th left toes Erythematous mole under left chest, and sof tissue painless swelling on rigth 4th digit Psych: Good eye contact, normal affect. Memory intact not anxious or depressed appearing.  CNS: CN 2-12 intact, power,  normal throughout.no  focal deficits noted.   Assessment & Plan   Seasonal allergies 1 week history of increased symptoms, recommend starting OTC claritin  Hypertension associated with stage 2 chronic kidney disease due to type 2 diabetes mellitus Controlled, no change in medication DASH diet and commitment to daily physical activity for a minimum of 30 minutes discussed and encouraged, as a part of hypertension management. The importance of attaining a healthy weight is also discussed.  BP/Weight 01/04/2015 11/11/2014 08/21/2014 08/12/2014 04/06/2014 02/13/2014 2/63/7858  Systolic BP 850 277 412 878 [phone]  Diastolic BP 82 70 82 76 80 78 78  Wt. (Lbs) 222.8 226 226 221.04 223 225 224.4  BMI 32.89 33.36 33.36 32.63 32.92 33.21 33.12        ASCVD (arteriosclerotic cardiovascular disease) Asymptomatic and currently stable  Diabetes mellitus, insulin dependent (IDDM), controlled Deteriorated Increase levemir dose and more lifestyle modification Robert [name] is reminded of the importance of commitment to daily physical activity for 30 minutes or more, as able and the need to limit carbohydrate intake to 30 to 60 grams per meal to help with blood sugar control.   The need to take medication as prescribed, test blood sugar as directed, and to call between visits if there is a concern that blood sugar is uncontrolled is also discussed.   Robert [name] is reminded of the importance of daily foot exam, annual eye examination, and good blood sugar, blood pressure and cholesterol control.  Diabetic Labs Latest Ref Rng 12/31/2014 08/06/2014 04/01/2014  11/06/2013 07/07/2013  HbA1c <5.7 % 7.7(H) 7.5(H) 7.7(H) 7.2(H) 7.2(H)  Microalbumin <2.0 mg/dL 2.5(H) - - 1.92(H) -  Micro/Creat Ratio 0.0 - 30.0 mg/g 10.2 - - 7.9 -  Chol 125 - 200 mg/dL 112(L) - 114 - 121  HDL >=40 mg/dL 30(L) - 35(L) - 35(L)  Calc LDL <130 mg/dL 49 - 51 - 62  Triglycerides <150 mg/dL 164(H) - 139 - 120  Creatinine 0.70 - 1.25 mg/dL 1.16 1.16 1.22  1.22 1.21   BP/Weight 01/04/2015 11/11/2014 08/21/2014 08/12/2014 04/06/2014 02/13/2014 3/41/9379  Systolic BP 024 097 353 299 242 683 419  Diastolic BP 82 70 82 76 80 78 78  Wt. (Lbs) 222.8 226 226 221.04 223 225 224.4  BMI 32.89 33.36 33.36 32.63 32.92 33.21 33.12   Foot/eye exam completion dates Latest Ref Rng 04/06/2014 03/10/2014  Eye Exam No Retinopathy - No Retinopathy  Foot exam Order - - -  Foot Form Completion - Done -         Hyperlipidemia LDL goal <70 Over corrected. Reduce lipitor dose Hyperlipidemia:Low fat diet discussed and encouraged.   Lipid Panel  Lab Results  Component Value Date   CHOL 112* 12/31/2014   HDL 30* 12/31/2014   LDLCALC 49 12/31/2014   TRIG 164* 12/31/2014   CHOLHDL 3.7 12/31/2014        Back pain without sciatica Recent flare x 3 weeks following excess crawling, responding to time and tylenol, continue same        Review of Systems     Objective:   Physical Exam        Assessment & Plan:

## 2015-01-04 NOTE — Assessment & Plan Note (Signed)
Improved.. Patient re-educated about  the importance of commitment to a  minimum of 150 minutes of exercise per week.  The importance of healthy food choices with portion control discussed. Encouraged to start a food diary, count calories and to consider  joining a support group. Sample diet sheets offered. Goals set by the patient for the next several months.   Weight /BMI 01/04/2015 11/11/2014 08/21/2014  WEIGHT 222 lb 12.8 oz 226 lb 226 lb  HEIGHT 5\' 9"  5\' 9"  5\' 9"   BMI 32.89 kg/m2 33.36 kg/m2 33.36 kg/m2    Current exercise per week 120 minutes.

## 2015-01-04 NOTE — Assessment & Plan Note (Signed)
Controlled, no change in medication DASH diet and commitment to daily physical activity for a minimum of 30 minutes discussed and encouraged, as a part of hypertension management. The importance of attaining a healthy weight is also discussed.  BP/Weight 01/04/2015 11/11/2014 08/21/2014 08/12/2014 04/06/2014 02/13/2014 8/33/8250  Systolic BP 539 767 341 [phone]  Diastolic BP 82 70 82 76 80 78 78  Wt. (Lbs) 222.8 226 226 221.04 223 225 224.4  BMI 32.89 33.36 33.36 32.63 32.92 33.21 33.12

## 2015-01-04 NOTE — Assessment & Plan Note (Signed)
Deteriorated Increase levemir dose and more lifestyle modification Robert [name] is reminded of the importance of commitment to daily physical activity for 30 minutes or more, as able and the need to limit carbohydrate intake to 30 to 60 grams per meal to help with blood sugar control.   The need to take medication as prescribed, test blood sugar as directed, and to call between visits if there is a concern that blood sugar is uncontrolled is also discussed.   Robert [name] is reminded of the importance of daily foot exam, annual eye examination, and good blood sugar, blood pressure and cholesterol control.  Diabetic Labs Latest Ref Rng 12/31/2014 08/06/2014 04/01/2014 11/06/2013 07/07/2013  HbA1c <5.7 % 7.7(H) 7.5(H) 7.7(H) 7.2(H) 7.2(H)  Microalbumin <2.0 mg/dL 2.5(H) - - 1.92(H) -  Micro/Creat Ratio 0.0 - 30.0 mg/g 10.2 - - 7.9 -  Chol 125 - 200 mg/dL 112(L) - 114 - 121  HDL >=40 mg/dL 30(L) - 35(L) - 35(L)  Calc LDL <130 mg/dL 49 - 51 - 62  Triglycerides <150 mg/dL 164(H) - 139 - 120  Creatinine 0.70 - 1.25 mg/dL 1.16 1.16 1.22 1.22 1.21   BP/Weight 01/04/2015 11/11/2014 08/21/2014 08/12/2014 04/06/2014 02/13/2014 07/30/7679  Systolic BP 157 262 035 597 416 384 536  Diastolic BP 82 70 82 76 80 78 78  Wt. (Lbs) 222.8 226 226 221.04 223 225 224.4  BMI 32.89 33.36 33.36 32.63 32.92 33.21 33.12   Foot/eye exam completion dates Latest Ref Rng 04/06/2014 03/10/2014  Eye Exam No Retinopathy - No Retinopathy  Foot exam Order - - -  Foot Form Completion - Done -

## 2015-01-04 NOTE — Patient Instructions (Signed)
F/u in 4 month, call if you need me before  Return for flu vaccine   Fasting labs for next visit.  Symptoms are allergy based, no infection currently. Start daily loratidine (claritin) which is over the counter, may continue medications you are currently taking  PLEASE commit to daily physical activty and reduce carb intake  INCREASE levemir to 70 units every morning from 65 units, CALL WITH CONCERNS Check blood sugar at LEAST once every day, preferab;y tWICE as blood sugar has increased   Lower lipitor dose to 40 mg , Please reduce fried and fatty foods  Goal for fasting blood sugar ranges from 90 to 130 and 2 hours after any meal or at bedtime should be between 140 to 180.   Thanks for choosing [facility], we consider it a privelige to serve you.

## 2015-01-04 NOTE — Assessment & Plan Note (Signed)
Asymptomatic and currently stable 

## 2015-01-04 NOTE — Assessment & Plan Note (Signed)
Over corrected. Reduce lipitor dose Hyperlipidemia:Low fat diet discussed and encouraged.   Lipid Panel  Lab Results  Component Value Date   CHOL 112* 12/31/2014   HDL 30* 12/31/2014   LDLCALC 49 12/31/2014   TRIG 164* 12/31/2014   CHOLHDL 3.7 12/31/2014

## 2015-01-26 ENCOUNTER — Ambulatory Visit (INDEPENDENT_AMBULATORY_CARE_PROVIDER_SITE_OTHER): Payer: BLUE CROSS/BLUE SHIELD

## 2015-01-26 DIAGNOSIS — Z23 Encounter for immunization: Secondary | ICD-10-CM

## 2015-02-07 ENCOUNTER — Other Ambulatory Visit: Payer: Self-pay | Admitting: Family Medicine

## 2015-03-18 LAB — HM DIABETES EYE EXAM

## 2015-04-01 ENCOUNTER — Encounter: Payer: Self-pay | Admitting: Cardiology

## 2015-04-01 ENCOUNTER — Ambulatory Visit (INDEPENDENT_AMBULATORY_CARE_PROVIDER_SITE_OTHER): Payer: BLUE CROSS/BLUE SHIELD | Admitting: Cardiology

## 2015-04-01 VITALS — BP 142/78 | HR 95 | Ht 69.0 in | Wt 229.0 lb

## 2015-04-01 DIAGNOSIS — I10 Essential (primary) hypertension: Secondary | ICD-10-CM | POA: Diagnosis not present

## 2015-04-01 DIAGNOSIS — I2510 Atherosclerotic heart disease of native coronary artery without angina pectoris: Secondary | ICD-10-CM | POA: Diagnosis not present

## 2015-04-01 DIAGNOSIS — E785 Hyperlipidemia, unspecified: Secondary | ICD-10-CM | POA: Diagnosis not present

## 2015-04-01 NOTE — Patient Instructions (Signed)
Your physician wants you to follow-up in: 1 Year with Dr. Branch. You will receive a reminder letter in the mail two months in advance. If you don't receive a letter, please call our office to schedule the follow-up appointment.   Your physician recommends that you continue on your current medications as directed. Please refer to the Current Medication list given to you today.  If you need a refill on your cardiac medications before your next appointment, please call your pharmacy.  Thank you for choosing [facility]!   

## 2015-04-01 NOTE — Progress Notes (Signed)
Clinical Summary Mr. Mundie is a 64 y.o.male seen today for follow up of the following medical problems.   1. CAD - history of prior stenting as described below -08/2014 exercise nuclear stress with Duke treadmill score of 7 consistent with low risk, small area of inferolateral ischemia. Mild inferior ischemia has been noted previously in 12/2009 stress, from notes he has history of RCA CTO that was not able to be opened during 2003 cath in [place].  - denies any chest pain. No SOB or DOE - compliant with meds   2. HTN - does not check regularly at home - compliant meds, however has not taken yet today.   3. Hyperlipidemia - compliant with statin, no recent panel in our system  4. DM II - followed by Dr Moshe Cipro  Past Medical History  Diagnosis Date  . Diabetes mellitus, type II ([place])   . Hypertension     Lab  09/2011: Normal CMet ex G-[id number]  . Tobacco abuse, in remission     30 pack years; quit in 1985  . Arteriosclerotic cardiovascular disease (ASCVD)     DES to LAD D1 - 2003 in [place]; failed intervention for a totally obstructed RCA at that time; EF of 45%; 12/2009: Equivocal stress nuclear with good exercise tolerance, negative stress EKG, normal EF with mild mid and distal inferior ischemia  . Hyperlipidemia     Lipid profile in 09/2011:128, 130, 33, 69  . Coronary artery disease      Allergies  Allergen Reactions  . Rosuvastatin Other (See Comments)    Adverse GI symptoms     Current Outpatient Prescriptions  Medication Sig Dispense Refill  . acetaminophen (TYLENOL) 500 MG tablet Take 1,000 mg by mouth every 6 (six) hours as needed for mild pain.    Marland Kitchen aspirin 81 MG tablet Take 81 mg by mouth daily.      Marland Kitchen atorvastatin (LIPITOR) 40 MG tablet Take 1 tablet (40 mg total) by mouth daily. 90 tablet 1  . betamethasone valerate (VALISONE) 0.1 % cream Apply 1 application topically daily. 30 g 0  . glipiZIDE (GLUCOTROL) 10 MG tablet TAKE 1 TABLET TWICE A DAY BEFORE  MEALS 180 tablet 1  . glucose blood (ONE TOUCH ULTRA TEST) test strip Use as instructed twice daily dx e11.9 100 each 5  . Insulin Detemir (LEVEMIR) 100 UNIT/ML Pen 80 units once daily 45 mL 5  . Insulin Pen Needle (B-D ULTRAFINE III SHORT PEN) 31G X 8 MM MISC USE AS DIRECTED WITH LEVEMIR FLEXPEN 100 each 11  . loratadine (CLARITIN) 10 MG tablet Take 1 tablet (10 mg total) by mouth daily. 30 tablet 11  . metoprolol succinate (TOPROL-XL) 50 MG 24 hr tablet TAKE 1 TABLET DAILY WITH OR IMMEDIATELY AFTER A MEAL 90 tablet 0  . olmesartan (BENICAR) 20 MG tablet TAKE 1 TABLET DAILY (DOSE REDUCTION) 90 tablet 1  . tadalafil (CIALIS) 5 MG tablet TAKE 1 TABLET (5 MG TOTAL) BY MOUTH DAILY AS NEEDED. 30 tablet 1   No current facility-administered medications for this visit.     Past Surgical History  Procedure Laterality Date  . Pressure ulcer debridement  2006    Right lower extremity  . Epididymis surgery  1996  . Knee arthroscopy w/ meniscectomy  11/2008    Right  . Rotator cuff repair  07/2009    Right  . Colonoscopy  2010    Negative screening study  . Cardiac catheterization    . Colonoscopy  N/A 11/11/2014    Procedure: COLONOSCOPY;  Surgeon: Rogene Houston, MD;  Location: [facility];  Service: Endoscopy;  Laterality: N/A;  1030     Allergies  Allergen Reactions  . Rosuvastatin Other (See Comments)    Adverse GI symptoms      Family History  Problem Relation Age of Onset  . Diabetes Sister   . Arthritis Brother   . Diabetes Brother   . Cancer Father     Hypernephroma     Social History Mr. Grissett reports that he quit smoking about 31 years ago. His smoking use included Cigarettes. He started smoking about 49 years ago. He has a 30 pack-year smoking history. He has never used smokeless tobacco. Mr. Caez reports that he does not drink alcohol.   Review of Systems CONSTITUTIONAL: No weight loss, fever, chills, weakness or fatigue.  HEENT: Eyes: No visual loss, blurred  vision, double vision or yellow sclerae.No hearing loss, sneezing, congestion, runny nose or sore throat.  SKIN: No rash or itching.  CARDIOVASCULAR: per hpi RESPIRATORY: No shortness of breath, cough or sputum.  GASTROINTESTINAL: No anorexia, nausea, vomiting or diarrhea. No abdominal pain or blood.  GENITOURINARY: No burning on urination, no polyuria NEUROLOGICAL: No headache, dizziness, syncope, paralysis, ataxia, numbness or tingling in the extremities. No change in bowel or bladder control.  MUSCULOSKELETAL: No muscle, back pain, joint pain or stiffness.  LYMPHATICS: No enlarged nodes. No history of splenectomy.  PSYCHIATRIC: No history of depression or anxiety.  ENDOCRINOLOGIC: No reports of sweating, cold or heat intolerance. No polyuria or polydipsia.  Marland Kitchen   Physical Examination Filed Vitals:   04/01/15 1509  BP: 142/78  Pulse: 95   Filed Vitals:   04/01/15 1509  Height: 5\' 9"  (1.753 m)  Weight: 229 lb (103.874 kg)    Gen: resting comfortably, no acute distress HEENT: no scleral icterus, pupils equal round and reactive, no palptable cervical adenopathy,  CV: RRR, no m/r/g, no jvd Resp: Clear to auscultation bilaterally GI: abdomen is soft, non-tender, non-distended, normal bowel sounds, no hepatosplenomegaly MSK: extremities are warm, no edema.  Skin: warm, no rash Neuro:  no focal deficits Psych: appropriate affect   Diagnostic Studies 08/2014 Exercise Nuclear stress  Low risk Duke treadmill score of 7.  Blood pressure demonstrated a hypertensive response to exercise.  There was no ST segment deviation noted during stress.  There is a small defect of mild severity present in the basal inferolateral, mid inferolateral and apical inferior location. The defect is reversible. Consistent with mild, predominantly mid to basal inferolateral ischemia.  Myocardial perfusion is abnormal. This is a low risk study.    Assessment and Plan   1. CAD - no current  symptoms, negative stress test recently - continue current meds  2. HTN - above goal in clinic today however he has not taken his meds yet, bp was at goal just 2 months ago at pcp appointment - continue current meds  3. Hyperlipidemia - continue statin, f/u upcoming panel   4. Former tobacco smoker - will need AAA screen at age 29  5. DM 2 - management per pcp   F/u 1 year  Arnoldo Lenis, M.D.

## 2015-04-03 ENCOUNTER — Other Ambulatory Visit: Payer: Self-pay | Admitting: Family Medicine

## 2015-04-09 LAB — HEMOGLOBIN A1C
HEMOGLOBIN A1C: 7.4 % — AB (ref <5.7)
Mean Plasma Glucose: 166 mg/dL — ABNORMAL HIGH (ref <117)

## 2015-04-10 LAB — LIPID PANEL
CHOLESTEROL: 138 mg/dL (ref 125–200)
HDL: 37 mg/dL — AB (ref >=40)
LDL Cholesterol: 62 mg/dL (ref <130)
TRIGLYCERIDES: 194 mg/dL — AB (ref <150)
Total CHOL/HDL Ratio: 3.7 Ratio (ref <=5.0)
VLDL: 39 mg/dL — ABNORMAL HIGH (ref <30)

## 2015-04-10 LAB — COMPLETE METABOLIC PANEL WITH GFR
ALT: 21 U/L (ref 9–46)
AST: 19 U/L (ref 10–35)
Albumin: 4.1 g/dL (ref 3.6–5.1)
Alkaline Phosphatase: 123 U/L — ABNORMAL HIGH (ref 40–115)
BILIRUBIN TOTAL: 0.5 mg/dL (ref 0.2–1.2)
BUN: 17 mg/dL (ref 7–25)
CALCIUM: 9.5 mg/dL (ref 8.6–10.3)
CO2: 25 mmol/L (ref 20–31)
CREATININE: 1.34 mg/dL — AB (ref 0.70–1.25)
Chloride: 103 mmol/L (ref 98–110)
GFR, EST NON AFRICAN AMERICAN: 56 mL/min — AB (ref >=60)
GFR, Est African American: 65 mL/min (ref >=60)
Glucose, Bld: 115 mg/dL — ABNORMAL HIGH (ref 65–99)
Potassium: 4.8 mmol/L (ref 3.5–5.3)
Sodium: 139 mmol/L (ref 135–146)
Total Protein: 7.4 g/dL (ref 6.1–8.1)

## 2015-04-13 ENCOUNTER — Encounter: Payer: Self-pay | Admitting: Family Medicine

## 2015-04-13 ENCOUNTER — Ambulatory Visit (INDEPENDENT_AMBULATORY_CARE_PROVIDER_SITE_OTHER): Payer: BLUE CROSS/BLUE SHIELD | Admitting: Family Medicine

## 2015-04-13 VITALS — BP 112/70 | HR 74 | Resp 18 | Ht 69.0 in | Wt 225.0 lb

## 2015-04-13 DIAGNOSIS — E785 Hyperlipidemia, unspecified: Secondary | ICD-10-CM

## 2015-04-13 DIAGNOSIS — E1122 Type 2 diabetes mellitus with diabetic chronic kidney disease: Secondary | ICD-10-CM

## 2015-04-13 DIAGNOSIS — IMO0001 Reserved for inherently not codable concepts without codable children: Secondary | ICD-10-CM

## 2015-04-13 DIAGNOSIS — I2510 Atherosclerotic heart disease of native coronary artery without angina pectoris: Secondary | ICD-10-CM

## 2015-04-13 DIAGNOSIS — J302 Other seasonal allergic rhinitis: Secondary | ICD-10-CM

## 2015-04-13 DIAGNOSIS — E669 Obesity, unspecified: Secondary | ICD-10-CM

## 2015-04-13 DIAGNOSIS — Z125 Encounter for screening for malignant neoplasm of prostate: Secondary | ICD-10-CM

## 2015-04-13 DIAGNOSIS — E119 Type 2 diabetes mellitus without complications: Secondary | ICD-10-CM

## 2015-04-13 DIAGNOSIS — N182 Chronic kidney disease, stage 2 (mild): Secondary | ICD-10-CM

## 2015-04-13 DIAGNOSIS — I129 Hypertensive chronic kidney disease with stage 1 through stage 4 chronic kidney disease, or unspecified chronic kidney disease: Secondary | ICD-10-CM

## 2015-04-13 DIAGNOSIS — Z794 Long term (current) use of insulin: Secondary | ICD-10-CM

## 2015-04-13 NOTE — Assessment & Plan Note (Signed)
Controlled, no change in medication  

## 2015-04-13 NOTE — Assessment & Plan Note (Signed)
Improved, pt applauded on this Robert [name] is reminded of the importance of commitment to daily physical activity for 30 minutes or more, as able and the need to limit carbohydrate intake to 30 to 60 grams per meal to help with blood sugar control.   The need to take medication as prescribed, test blood sugar as directed, and to call between visits if there is a concern that blood sugar is uncontrolled is also discussed.   Robert [name] is reminded of the importance of daily foot exam, annual eye examination, and good blood sugar, blood pressure and cholesterol control.  Diabetic Labs Latest Ref Rng 04/09/2015 12/31/2014 08/06/2014 04/01/2014 11/06/2013  HbA1c <5.7 % 7.4(H) 7.7(H) 7.5(H) 7.7(H) 7.2(H)  Microalbumin <2.0 mg/dL - 2.5(H) - - 1.92(H)  Micro/Creat Ratio 0.0 - 30.0 mg/g - 10.2 - - 7.9  Chol 125 - 200 mg/dL 138 112(L) - 114 -  HDL >=40 mg/dL 37(L) 30(L) - 35(L) -  Calc LDL <130 mg/dL 62 49 - 51 -  Triglycerides <150 mg/dL 194(H) 164(H) - 139 -  Creatinine 0.70 - 1.25 mg/dL 1.34(H) 1.16 1.16 1.22 1.22   BP/Weight 04/13/2015 04/01/2015 01/04/2015 11/11/2014 08/21/2014 [id number]  Systolic BP [id number]  Diastolic BP 70 78 82 70 82 76 80  Wt. (Lbs) 225 229 222.8 226 226 221.04 223  BMI 33.21 33.8 32.89 33.36 33.36 32.63 32.92   Foot/eye exam completion dates Latest Ref Rng 04/13/2015 03/18/2015  Eye Exam No Retinopathy - No Retinopathy  Foot exam Order - - -  Foot Form Completion - Done -

## 2015-04-13 NOTE — Patient Instructions (Addendum)
F/u in 4 month, call if you need me sooner  Improved blood sugar, gREAT  Commit to 10 to 15 minutes exercise after each meal, this will give additional benefit  Lesion on your ear does not need to be removed, doo not pick it, justremind me to check it when you come in  [place] lotion, with baby oil mixed in will be a great emollient   Continue to use the hydiocortisone on excema since it works and is not as[potenet  Reduce fried and fatty foods  Fasting lipid, cmp and eGFr, HBA1C PSA and TSH May 6 or after  Foot exam today is good

## 2015-04-13 NOTE — Assessment & Plan Note (Signed)
Stable and asymptomatic as far as angina and heart failure symptoms are concerned

## 2015-04-13 NOTE — Assessment & Plan Note (Signed)
Hyperlipidemia:Low fat diet discussed and encouraged.   Lipid Panel  Lab Results  Component Value Date   CHOL 138 04/09/2015   HDL 37* 04/09/2015   LDLCALC 62 04/09/2015   TRIG 194* 04/09/2015   CHOLHDL 3.7 04/09/2015   Needs to inc exercise and reduce fat intake, no mmed change  \

## 2015-04-13 NOTE — Progress Notes (Signed)
Subjective:    Patient ID: Robert [name], male    DOB: April 12, 1950, 65 y.o.   MRN: [id number]  HPI   Robert [name]     MRN: [id number]      DOB: Oct 22, 1950   HPI Robert [name] is here for follow up and re-evaluation of chronic medical conditions, medication management and review of any available recent lab and radiology data.  Preventive health is updated, specifically  Cancer screening and Immunization.   Questions or concerns regarding consultations or procedures which the PT has had in the interim are  addressed. The PT denies any adverse reactions to current medications since the last visit.  There are no new concerns.  There are no specific complaints   ROS Denies recent fever or chills. Denies sinus pressure, nasal congestion, ear pain or sore throat. Denies chest congestion, productive cough or wheezing. Denies chest pains, palpitations and leg swelling Denies abdominal pain, nausea, vomiting,diarrhea or constipation.   Denies dysuria, frequency, hesitancy or incontinence. Denies joint pain, swelling and limitation in mobility. Denies headaches, seizures, numbness, or tingling. Denies depression, anxiety or insomnia. Denies skin break down or rash.   PE  BP 112/70 mmHg  Pulse 74  Resp 18  Ht 5\' 9"  (1.753 m)  Wt 225 lb (102.059 kg)  BMI 33.21 kg/m2  SpO2 96%  Patient alert and oriented and in no cardiopulmonary distress.  HEENT: No facial asymmetry, EOMI,   oropharynx pink and moist.  Neck supple no JVD, no mass.  Chest: Clear to auscultation bilaterally.  CVS: S1, S2 no murmurs, no S3.Regular rate.  ABD: Soft non tender.   Ext: No edema  MS: Adequate ROM spine, shoulders, hips and knees.  Skin: Intact, eczema present in patches on trunk and feet, also mild tinea pedis and onychomycosis. Non tender white pea sized nodule on right upper earlobe, no erythema , warmth or drainage  Psych: Good eye contact, normal affect. Memory intact not anxious or  depressed appearing.  CNS: CN 2-12 intact, power,  normal throughout.no focal deficits noted.   Assessment & Plan   Diabetes mellitus, insulin dependent (IDDM), controlled Improved, pt applauded on this Robert [name] is reminded of the importance of commitment to daily physical activity for 30 minutes or more, as able and the need to limit carbohydrate intake to 30 to 60 grams per meal to help with blood sugar control.   The need to take medication as prescribed, test blood sugar as directed, and to call between visits if there is a concern that blood sugar is uncontrolled is also discussed.   Robert [name] is reminded of the importance of daily foot exam, annual eye examination, and good blood sugar, blood pressure and cholesterol control.  Diabetic Labs Latest Ref Rng 04/09/2015 12/31/2014 08/06/2014 04/01/2014 11/06/2013  HbA1c <5.7 % 7.4(H) 7.7(H) 7.5(H) 7.7(H) 7.2(H)  Microalbumin <2.0 mg/dL - 2.5(H) - - 1.92(H)  Micro/Creat Ratio 0.0 - 30.0 mg/g - 10.2 - - 7.9  Chol 125 - 200 mg/dL 138 112(L) - 114 -  HDL >=40 mg/dL 37(L) 30(L) - 35(L) -  Calc LDL <130 mg/dL 62 49 - 51 -  Triglycerides <150 mg/dL 194(H) 164(H) - 139 -  Creatinine 0.70 - 1.25 mg/dL 1.34(H) 1.16 1.16 1.22 1.22   BP/Weight 04/13/2015 04/01/2015 01/04/2015 11/11/2014 08/21/2014 [id number]  Systolic BP [id number]  Diastolic BP 70 78 82 70 82 76 80  Wt. (Lbs) 225 229 222.8 226 226 221.04 223  BMI 33.21 33.8 32.89 33.36 33.36 32.63 32.92   Foot/eye exam completion dates Latest Ref Rng 04/13/2015 03/18/2015  Eye Exam No Retinopathy - No Retinopathy  Foot exam Order - - -  Foot Form Completion - Done -         Obesity (BMI 30.0-34.9) Deteriorated. Patient re-educated about  the importance of commitment to a  minimum of 150 minutes of exercise per week.  The importance of healthy food choices with portion control discussed. Encouraged to start a food diary, count calories and to consider  joining a  support group. Sample diet sheets offered. Goals set by the patient for the next several months.   Weight /BMI 04/13/2015 04/01/2015 01/04/2015  WEIGHT 225 lb 229 lb 222 lb 12.8 oz  HEIGHT 5\' 9"  5\' 9"  5\' 9"   BMI 33.21 kg/m2 33.8 kg/m2 32.89 kg/m2    Current exercise per week 60 minutes.   Hyperlipidemia LDL goal <70 Hyperlipidemia:Low fat diet discussed and encouraged.   Lipid Panel  Lab Results  Component Value Date   CHOL 138 04/09/2015   HDL 37* 04/09/2015   LDLCALC 62 04/09/2015   TRIG 194* 04/09/2015   CHOLHDL 3.7 04/09/2015   Needs to inc exercise and reduce fat intake, no mmed change  \  Seasonal allergies Controlled, no change in medication   ASCVD (arteriosclerotic cardiovascular disease) Stable and asymptomatic as far as angina and heart failure symptoms are concerned       Review of Systems     Objective:   Physical Exam        Assessment & Plan:

## 2015-04-13 NOTE — Assessment & Plan Note (Signed)
Deteriorated. Patient re-educated about  the importance of commitment to a  minimum of 150 minutes of exercise per week.  The importance of healthy food choices with portion control discussed. Encouraged to start a food diary, count calories and to consider  joining a support group. Sample diet sheets offered. Goals set by the patient for the next several months.   Weight /BMI 04/13/2015 04/01/2015 01/04/2015  WEIGHT 225 lb 229 lb 222 lb 12.8 oz  HEIGHT 5\' 9"  5\' 9"  5\' 9"   BMI 33.21 kg/m2 33.8 kg/m2 32.89 kg/m2    Current exercise per week 60 minutes.

## 2015-05-03 ENCOUNTER — Other Ambulatory Visit: Payer: Self-pay

## 2015-05-03 DIAGNOSIS — Z794 Long term (current) use of insulin: Principal | ICD-10-CM

## 2015-05-03 DIAGNOSIS — E119 Type 2 diabetes mellitus without complications: Principal | ICD-10-CM

## 2015-05-03 DIAGNOSIS — IMO0001 Reserved for inherently not codable concepts without codable children: Secondary | ICD-10-CM

## 2015-05-03 MED ORDER — INSULIN DETEMIR 100 UNIT/ML FLEXPEN: PEN_INJECTOR | SUBCUTANEOUS | Status: DC

## 2015-05-03 MED ORDER — INSULIN PEN NEEDLE 31G X 8 MM MISC: Status: DC

## 2015-05-04 ENCOUNTER — Other Ambulatory Visit: Payer: Self-pay

## 2015-05-04 DIAGNOSIS — Z794 Long term (current) use of insulin: Principal | ICD-10-CM

## 2015-05-04 DIAGNOSIS — IMO0001 Reserved for inherently not codable concepts without codable children: Secondary | ICD-10-CM

## 2015-05-04 DIAGNOSIS — E119 Type 2 diabetes mellitus without complications: Principal | ICD-10-CM

## 2015-05-04 MED ORDER — INSULIN PEN NEEDLE 31G X 8 MM MISC: Status: DC

## 2015-05-04 MED ORDER — INSULIN DETEMIR 100 UNIT/ML FLEXPEN: PEN_INJECTOR | SUBCUTANEOUS | Status: DC

## 2015-08-10 LAB — COMPLETE METABOLIC PANEL WITH GFR
ALBUMIN: 4 g/dL (ref 3.6–5.1)
ALK PHOS: 108 U/L (ref 40–115)
ALT: 16 U/L (ref 9–46)
AST: 15 U/L (ref 10–35)
BILIRUBIN TOTAL: 0.4 mg/dL (ref 0.2–1.2)
BUN: 13 mg/dL (ref 7–25)
CALCIUM: 8.9 mg/dL (ref 8.6–10.3)
CHLORIDE: 104 mmol/L (ref 98–110)
CO2: 26 mmol/L (ref 20–31)
CREATININE: 1.14 mg/dL (ref 0.70–1.25)
GFR, EST AFRICAN AMERICAN: 79 mL/min (ref >=60)
GFR, Est Non African American: 68 mL/min (ref >=60)
Glucose, Bld: 123 mg/dL — ABNORMAL HIGH (ref 65–99)
Potassium: 4.3 mmol/L (ref 3.5–5.3)
Sodium: 137 mmol/L (ref 135–146)
TOTAL PROTEIN: 6.8 g/dL (ref 6.1–8.1)

## 2015-08-10 LAB — HEMOGLOBIN A1C
HEMOGLOBIN A1C: 7.5 % — AB (ref <5.7)
MEAN PLASMA GLUCOSE: 169 mg/dL

## 2015-08-10 LAB — LIPID PANEL
Cholesterol: 110 mg/dL — ABNORMAL LOW (ref 125–200)
HDL: 37 mg/dL — AB (ref >=40)
LDL CALC: 47 mg/dL (ref <130)
Total CHOL/HDL Ratio: 3 Ratio (ref <=5.0)
Triglycerides: 131 mg/dL (ref <150)
VLDL: 26 mg/dL (ref <30)

## 2015-08-10 LAB — TSH: TSH: 1.23 m[IU]/L (ref 0.40–4.50)

## 2015-08-10 LAB — PSA: PSA: 1.26 ng/mL (ref <=4.00)

## 2015-08-11 ENCOUNTER — Ambulatory Visit (INDEPENDENT_AMBULATORY_CARE_PROVIDER_SITE_OTHER): Payer: BLUE CROSS/BLUE SHIELD | Admitting: Family Medicine

## 2015-08-11 ENCOUNTER — Encounter: Payer: Self-pay | Admitting: Family Medicine

## 2015-08-11 VITALS — BP 120/72 | HR 75 | Resp 16 | Ht 69.0 in | Wt 223.0 lb

## 2015-08-11 DIAGNOSIS — Z1211 Encounter for screening for malignant neoplasm of colon: Secondary | ICD-10-CM

## 2015-08-11 DIAGNOSIS — E1122 Type 2 diabetes mellitus with diabetic chronic kidney disease: Secondary | ICD-10-CM

## 2015-08-11 DIAGNOSIS — N182 Chronic kidney disease, stage 2 (mild): Secondary | ICD-10-CM

## 2015-08-11 DIAGNOSIS — E785 Hyperlipidemia, unspecified: Secondary | ICD-10-CM | POA: Diagnosis not present

## 2015-08-11 DIAGNOSIS — E669 Obesity, unspecified: Secondary | ICD-10-CM

## 2015-08-11 DIAGNOSIS — I129 Hypertensive chronic kidney disease with stage 1 through stage 4 chronic kidney disease, or unspecified chronic kidney disease: Secondary | ICD-10-CM

## 2015-08-11 DIAGNOSIS — E119 Type 2 diabetes mellitus without complications: Secondary | ICD-10-CM

## 2015-08-11 DIAGNOSIS — IMO0001 Reserved for inherently not codable concepts without codable children: Secondary | ICD-10-CM

## 2015-08-11 DIAGNOSIS — Z794 Long term (current) use of insulin: Secondary | ICD-10-CM

## 2015-08-11 DIAGNOSIS — M5489 Other dorsalgia: Secondary | ICD-10-CM

## 2015-08-11 DIAGNOSIS — E559 Vitamin D deficiency, unspecified: Secondary | ICD-10-CM

## 2015-08-11 LAB — POC HEMOCCULT BLD/STL (OFFICE/1-CARD/DIAGNOSTIC): Fecal Occult Blood, POC: NEGATIVE

## 2015-08-11 NOTE — Patient Instructions (Addendum)
F/u in 5 month, call if you need me before  Rectal today  Pls change diet to improve blood sugar  Back pain is from arthritis  Call re scaly areas on ears if they worsen or progress,or if they concern you more. You will be referred to dermatology, as we discussed  Fasting cmp and EGFR, hBA1C, lipid, cBc, TSH and vit D in 5 month, Oct 5 or after Please work on good  health habits so that your health will improve. 1. Commitment to daily physical activity for 30 to 60  minutes, if you are able to do this.  2. Commitment to wise food choices. Aim for half of your  food intake to be vegetable and fruit, one quarter starchy foods, and one quarter protein. Try to eat on a regular schedule  3 meals per day, snacking between meals should be limited to vegetables or fruits or small portions of nuts. 64 ounces of water per day is generally recommended, unless you have specific health conditions, like heart failure or kidney failure where you will need to limit fluid intake.  3. Commitment to sufficient and a  good quality of physical and mental rest daily, generally between 6 to 8 hours per day.   WITH PERSISTANCE AND PERSEVERANCE, THE IMPOSSIBLE , BECOMES THE NORM! Back Exercises If you have pain in your back, do these exercises 2-3 times each day or as told by your doctor. When the pain goes away, do the exercises once each day, but repeat the steps more times for each exercise (do more repetitions). If you do not have pain in your back, do these exercises once each day or as told by your doctor. EXERCISES Single Knee to Chest Do these steps 3-5 times in a row for each leg:  Lie on your back on a firm bed or the floor with your legs stretched out.  Bring one knee to your chest.  Hold your knee to your chest by grabbing your knee or thigh.  Pull on your knee until you feel a gentle stretch in your lower back.  Keep doing the stretch for 10-30 seconds.  Slowly let go of your leg and  straighten it. Pelvic Tilt Do these steps 5-10 times in a row:  Lie on your back on a firm bed or the floor with your legs stretched out.  Bend your knees so they point up to the ceiling. Your feet should be flat on the floor.  Tighten your lower belly (abdomen) muscles to press your lower back against the floor. This will make your tailbone point up to the ceiling instead of pointing down to your feet or the floor.  Stay in this position for 5-10 seconds while you gently tighten your muscles and breathe evenly. Cat-Cow Do these steps until your lower back bends more easily:  Get on your hands and knees on a firm surface. Keep your hands under your shoulders, and keep your knees under your hips. You may put padding under your knees.  Let your head hang down, and make your tailbone point down to the floor so your lower back is round like the back of a cat.  Stay in this position for 5 seconds.  Slowly lift your head and make your tailbone point up to the ceiling so your back hangs low (sags) like the back of a cow.  Stay in this position for 5 seconds. Press-Ups Do these steps 5-10 times in a row: 1. Lie on your belly (  face-down) on the floor. 2. Place your hands near your head, about shoulder-width apart. 3. While you keep your back relaxed and keep your hips on the floor, slowly straighten your arms to raise the top half of your body and lift your shoulders. Do not use your back muscles. To make yourself more comfortable, you may change where you place your hands. 4. Stay in this position for 5 seconds. 5. Slowly return to lying flat on the floor. Bridges Do these steps 10 times in a row: 1. Lie on your back on a firm surface. 2. Bend your knees so they point up to the ceiling. Your feet should be flat on the floor. 3. Tighten your butt muscles and lift your butt off of the floor until your waist is almost as high as your knees. If you do not feel the muscles working in your butt  and the back of your thighs, slide your feet 1-2 inches farther away from your butt. 4. Stay in this position for 3-5 seconds. 5. Slowly lower your butt to the floor, and let your butt muscles relax. If this exercise is too easy, try doing it with your arms crossed over your chest. Belly Crunches Do these steps 5-10 times in a row: 1. Lie on your back on a firm bed or the floor with your legs stretched out. 2. Bend your knees so they point up to the ceiling. Your feet should be flat on the floor. 3. Cross your arms over your chest. 4. Tip your chin a little bit toward your chest but do not bend your neck. 5. Tighten your belly muscles and slowly raise your chest just enough to lift your shoulder blades a tiny bit off of the floor. 6. Slowly lower your chest and your head to the floor. Back Lifts Do these steps 5-10 times in a row: 1. Lie on your belly (face-down) with your arms at your sides, and rest your forehead on the floor. 2. Tighten the muscles in your legs and your butt. 3. Slowly lift your chest off of the floor while you keep your hips on the floor. Keep the back of your head in line with the curve in your back. Look at the floor while you do this. 4. Stay in this position for 3-5 seconds. 5. Slowly lower your chest and your face to the floor. GET HELP IF:  Your back pain gets a lot worse when you do an exercise.  Your back pain does not lessen 2 hours after you exercise. If you have any of these problems, stop doing the exercises. Do not do them again unless your doctor says it is okay. GET HELP RIGHT AWAY IF:  You have sudden, very bad back pain. If this happens, stop doing the exercises. Do not do them again unless your doctor says it is okay.   This information is not intended to replace advice given to you by your health care provider. Make sure you discuss any questions you have with your health care provider.   Document Released: 04/22/2010 Document Revised: 12/09/2014  Document Reviewed: 05/14/2014 Elsevier Interactive Patient Education 2016 [employer] Chronic Back Pain  When back pain lasts longer than 3 months, it is called chronic back pain.People with chronic back pain often go through certain periods that are more intense (flare-ups).  CAUSES Chronic back pain can be caused by wear and tear (degeneration) on different structures in your back. These structures include:  The bones of your spine (  vertebrae) and the joints surrounding your spinal cord and nerve roots (facets).  The strong, fibrous tissues that connect your vertebrae (ligaments). Degeneration of these structures may result in pressure on your nerves. This can lead to constant pain. HOME CARE INSTRUCTIONS  Avoid bending, heavy lifting, prolonged sitting, and activities which make the problem worse.  Take brief periods of rest throughout the day to reduce your pain. Lying down or standing usually is better than sitting while you are resting.  Take over-the-counter or prescription medicines only as directed by your caregiver. SEEK IMMEDIATE MEDICAL CARE IF:   You have weakness or numbness in one of your legs or feet.  You have trouble controlling your bladder or bowels.  You have nausea, vomiting, abdominal pain, shortness of breath, or fainting.   This information is not intended to replace advice given to you by your health care provider. Make sure you discuss any questions you have with your health care provider.   Document Released: 04/27/2004 Document Revised: 06/12/2011 Document Reviewed: 09/07/2014 Elsevier Interactive Patient Education [employer].

## 2015-08-11 NOTE — Progress Notes (Signed)
Subjective:    Patient ID: Robert [name], male    DOB: 04/14/1950, 65 y.o.   MRN: [id number]  HPI   URHO MANDEL     MRN: [id number]      DOB: September 19, 1950   HPI Mr. Holsworth is here for follow up and re-evaluation of chronic medical conditions, medication management and review of any available recent lab and radiology data.  Preventive health is updated, specifically  Cancer screening and Immunization.   Questions or concerns regarding consultations or procedures which the PT has had in the interim are  addressed. The PT denies any adverse reactions to current medications since the last visit.  Denies polyuria, polydipsia, blurred vision , or hypoglycemic episodes.   ROS Denies recent fever or chills. Denies sinus pressure, nasal congestion, ear pain or sore throat. Denies chest congestion, productive cough or wheezing. Denies chest pains, palpitations and leg swelling Denies abdominal pain, nausea, vomiting,diarrhea or constipation.   Denies dysuria, frequency, hesitancy or incontinence. C/o localized low back pain limiting activity at times, has appt with chiropractor today Denies headaches, seizures, numbness, or tingling. Denies depression, anxiety or insomnia. Denies skin break down or rash.   PE  BP 120/72 mmHg  Pulse 75  Resp 16  Ht 5\' 9"  (1.753 m)  Wt 223 lb (101.152 kg)  BMI 32.92 kg/m2  SpO2 96%  Patient alert and oriented and in no cardiopulmonary distress.  HEENT: No facial asymmetry, EOMI,   oropharynx pink and moist.  Neck supple no JVD, no mass.  Chest: Clear to auscultation bilaterally.  CVS: S1, S2 no murmurs, no S3.Regular rate.  ABD: Soft non tender.   Ext: No edema  MS: decreased ROM lumbar spine, adequate in  shoulders, hips and knees.  Skin: Intact, no ulcerations or rash noted.  Psych: Good eye contact, normal affect. Memory intact not anxious or depressed appearing.  CNS: CN 2-12 intact, power,  normal throughout.no focal deficits  noted.   Assessment & Plan   Hypertension associated with stage 2 chronic kidney disease due to type 2 diabetes mellitus Controlled, no change in medication DASH diet and commitment to daily physical activity for a minimum of 30 minutes discussed and encouraged, as a part of hypertension management. The importance of attaining a healthy weight is also discussed.  BP/Weight 08/11/2015 04/13/2015 04/01/2015 01/04/2015 11/11/2014 08/21/2014 123456  Systolic BP 123456 [id number] 123456 [id number]  Diastolic BP 72 70 78 82 70 82 76  Wt. (Lbs) 223 225 229 222.8 226 226 221.04  BMI 32.92 33.21 33.8 32.89 33.36 33.36 32.63        Diabetes mellitus, insulin dependent (IDDM), controlled Controlled, no change in medication Mr. Etheridge is reminded of the importance of commitment to daily physical activity for 30 minutes or more, as able and the need to limit carbohydrate intake to 30 to 60 grams per meal to help with blood sugar control.   The need to take medication as prescribed, test blood sugar as directed, and to call between visits if there is a concern that blood sugar is uncontrolled is also discussed.   Mr. Shillito is reminded of the importance of daily foot exam, annual eye examination, and good blood sugar, blood pressure and cholesterol control.  Diabetic Labs Latest Ref Rng 08/09/2015 04/09/2015 12/31/2014 08/06/2014 04/01/2014  HbA1c <5.7 % 7.5(H) 7.4(H) 7.7(H) 7.5(H) 7.7(H)  Microalbumin <2.0 mg/dL - - 2.5(H) - -  Micro/Creat Ratio 0.0 - 30.0 mg/g - - 10.2 - -  Chol 125 - 200 mg/dL 110(L) 138 112(L) - 114  HDL >=40 mg/dL 37(L) 37(L) 30(L) - 35(L)  Calc LDL <130 mg/dL 47 62 49 - 51  Triglycerides <150 mg/dL 131 194(H) 164(H) - 139  Creatinine 0.70 - 1.25 mg/dL 1.14 1.34(H) 1.16 1.16 1.22   BP/Weight 08/11/2015 04/13/2015 04/01/2015 01/04/2015 11/11/2014 08/21/2014 123456  Systolic BP 123456 [id number] 123456 [id number]  Diastolic BP 72 70 78 82 70 82 76  Wt. (Lbs) 223 225 229 222.8 226 226 221.04    BMI 32.92 33.21 33.8 32.89 33.36 33.36 32.63   Foot/eye exam completion dates Latest Ref Rng 04/13/2015 03/18/2015  Eye Exam No Retinopathy - No Retinopathy  Foot Form Completion - Done -         Back pain without sciatica Chronic, discussed and reviewed x ray report of lumbar spine, educated re chronic back pain and gave printout of back exercises which would be useful  Hyperlipidemia LDL goal <70 Hyperlipidemia:Low fat diet discussed and encouraged.   Lipid Panel  Lab Results  Component Value Date   CHOL 110* 08/09/2015   HDL 37* 08/09/2015   LDLCALC 47 08/09/2015   TRIG 131 08/09/2015   CHOLHDL 3.0 08/09/2015   Needs to commit to regular exercise     Obesity (BMI 30-39.9) Improved. Patient re-educated about  the importance of commitment to a  minimum of 150 minutes of exercise per week.  The importance of healthy food choices with portion control discussed. Encouraged to start a food diary, count calories and to consider  joining a support group. Sample diet sheets offered. Goals set by the patient for the next several months.   Weight /BMI 08/11/2015 04/13/2015 04/01/2015  WEIGHT 223 lb 225 lb 229 lb  HEIGHT 5\' 9"  5\' 9"  5\' 9"   BMI 32.92 kg/m2 33.21 kg/m2 33.8 kg/m2    Current exercise per week 90 minutes.        Review of Systems     Objective:   Physical Exam        Assessment & Plan:

## 2015-08-13 ENCOUNTER — Other Ambulatory Visit: Payer: Self-pay

## 2015-08-13 DIAGNOSIS — E785 Hyperlipidemia, unspecified: Secondary | ICD-10-CM

## 2015-08-13 MED ORDER — ATORVASTATIN CALCIUM 40 MG PO TABS: 40.0000 mg | ORAL_TABLET | Freq: Every day | ORAL | Status: DC

## 2015-08-13 MED ORDER — OLMESARTAN MEDOXOMIL 20 MG PO TABS: ORAL_TABLET | ORAL | Status: DC

## 2015-08-13 MED ORDER — GLIPIZIDE 10 MG PO TABS: ORAL_TABLET | ORAL | Status: DC

## 2015-08-14 DIAGNOSIS — E669 Obesity, unspecified: Secondary | ICD-10-CM | POA: Insufficient documentation

## 2015-08-14 NOTE — Assessment & Plan Note (Signed)
Chronic, discussed and reviewed x ray report of lumbar spine, educated re chronic back pain and gave printout of back exercises which would be useful

## 2015-08-14 NOTE — Assessment & Plan Note (Signed)
Improved. Patient re-educated about  the importance of commitment to a  minimum of 150 minutes of exercise per week.  The importance of healthy food choices with portion control discussed. Encouraged to start a food diary, count calories and to consider  joining a support group. Sample diet sheets offered. Goals set by the patient for the next several months.   Weight /BMI 08/11/2015 04/13/2015 04/01/2015  WEIGHT 223 lb 225 lb 229 lb  HEIGHT 5\' 9"  5\' 9"  5\' 9"   BMI 32.92 kg/m2 33.21 kg/m2 33.8 kg/m2    Current exercise per week 90 minutes.

## 2015-08-14 NOTE — Assessment & Plan Note (Signed)
Controlled, no change in medication DASH diet and commitment to daily physical activity for a minimum of 30 minutes discussed and encouraged, as a part of hypertension management. The importance of attaining a healthy weight is also discussed.  BP/Weight 08/11/2015 04/13/2015 04/01/2015 01/04/2015 11/11/2014 08/21/2014 123456  Systolic BP 123456 [id number] 123456 [id number]  Diastolic BP 72 70 78 82 70 82 76  Wt. (Lbs) 223 225 229 222.8 226 226 221.04  BMI 32.92 33.21 33.8 32.89 33.36 33.36 32.63

## 2015-08-14 NOTE — Assessment & Plan Note (Signed)
Hyperlipidemia:Low fat diet discussed and encouraged.   Lipid Panel  Lab Results  Component Value Date   CHOL 110* 08/09/2015   HDL 37* 08/09/2015   LDLCALC 47 08/09/2015   TRIG 131 08/09/2015   CHOLHDL 3.0 08/09/2015   Needs to commit to regular exercise

## 2015-08-14 NOTE — Assessment & Plan Note (Signed)
Controlled, no change in medication Robert [name] is reminded of the importance of commitment to daily physical activity for 30 minutes or more, as able and the need to limit carbohydrate intake to 30 to 60 grams per meal to help with blood sugar control.   The need to take medication as prescribed, test blood sugar as directed, and to call between visits if there is a concern that blood sugar is uncontrolled is also discussed.   Robert [name] is reminded of the importance of daily foot exam, annual eye examination, and good blood sugar, blood pressure and cholesterol control.  Diabetic Labs Latest Ref Rng 08/09/2015 04/09/2015 12/31/2014 08/06/2014 04/01/2014  HbA1c <5.7 % 7.5(H) 7.4(H) 7.7(H) 7.5(H) 7.7(H)  Microalbumin <2.0 mg/dL - - 2.5(H) - -  Micro/Creat Ratio 0.0 - 30.0 mg/g - - 10.2 - -  Chol 125 - 200 mg/dL 110(L) 138 112(L) - 114  HDL >=40 mg/dL 37(L) 37(L) 30(L) - 35(L)  Calc LDL <130 mg/dL 47 62 49 - 51  Triglycerides <150 mg/dL 131 194(H) 164(H) - 139  Creatinine 0.70 - 1.25 mg/dL 1.14 1.34(H) 1.16 1.16 1.22   BP/Weight 08/11/2015 04/13/2015 04/01/2015 01/04/2015 11/11/2014 08/21/2014 123456  Systolic BP 123456 [id number] 123456 [id number]  Diastolic BP 72 70 78 82 70 82 76  Wt. (Lbs) 223 225 229 222.8 226 226 221.04  BMI 32.92 33.21 33.8 32.89 33.36 33.36 32.63   Foot/eye exam completion dates Latest Ref Rng 04/13/2015 03/18/2015  Eye Exam No Retinopathy - No Retinopathy  Foot Form Completion - Done -

## 2015-09-01 ENCOUNTER — Telehealth: Payer: Self-pay | Admitting: Family Medicine

## 2015-09-01 DIAGNOSIS — R6889 Other general symptoms and signs: Secondary | ICD-10-CM

## 2015-09-01 DIAGNOSIS — G4730 Sleep apnea, unspecified: Secondary | ICD-10-CM

## 2015-09-01 DIAGNOSIS — R5382 Chronic fatigue, unspecified: Secondary | ICD-10-CM

## 2015-09-01 DIAGNOSIS — R0683 Snoring: Secondary | ICD-10-CM

## 2015-09-01 DIAGNOSIS — I519 Heart disease, unspecified: Secondary | ICD-10-CM

## 2015-09-01 NOTE — Telephone Encounter (Signed)
I spoke with pt this morning, I have referred him to Dr Merlene Laughter for sleep apnea eval and to his heart Doc for evaluation of increased fatigue and snoring/ sleep apnea, referral are entered, pls follow through

## 2015-09-06 NOTE — Telephone Encounter (Signed)
[name] is scheduled with Dr. Merlene Laughter on June 14th at 11:15

## 2015-09-23 ENCOUNTER — Ambulatory Visit (INDEPENDENT_AMBULATORY_CARE_PROVIDER_SITE_OTHER): Payer: BLUE CROSS/BLUE SHIELD | Admitting: Cardiology

## 2015-09-23 ENCOUNTER — Encounter: Payer: Self-pay | Admitting: Cardiology

## 2015-09-23 VITALS — BP 110/70 | HR 98 | Ht 69.0 in | Wt 226.0 lb

## 2015-09-23 DIAGNOSIS — I2510 Atherosclerotic heart disease of native coronary artery without angina pectoris: Secondary | ICD-10-CM

## 2015-09-23 DIAGNOSIS — I10 Essential (primary) hypertension: Secondary | ICD-10-CM

## 2015-09-23 DIAGNOSIS — E785 Hyperlipidemia, unspecified: Secondary | ICD-10-CM

## 2015-09-23 DIAGNOSIS — R5383 Other fatigue: Secondary | ICD-10-CM | POA: Diagnosis not present

## 2015-09-23 NOTE — Progress Notes (Addendum)
Clinical Summary Robert [name] is a 65 y.o.male seen today for follow up of the following medical problems.   1. CAD - history of prior stenting as described below -08/2014 exercise nuclear stress with Duke treadmill score of 7 consistent with low risk, small area of inferolateral ischemia. Mild inferior ischemia has been noted previously in 12/2009 stress, from notes he has history of RCA CTO that was not able to be opened during 2003 cath in [place].   - occasional left sided chest pain. Dull pain, 2-3/10. No other associated symptoms. Not positional. Lasts just a few seconds. Sporadic, can occur at rest or with exertion. Ongoing for several years.   2. HTN - does not check regularly at home - compliant meds  3. Hyperlipidemia - compliant with statin  4. DM II - followed by Dr Moshe Cipro  5. Fatigue - has appointment for OSA evaluation. +snoring, no apneic episodes,  - symptoms off and on years. Takes naps during the day, sits down and nods off.    Past Medical History  Diagnosis Date  . Diabetes mellitus, type II ([place])   . Hypertension     Lab  09/2011: Normal CMet ex G-[id number]  . Tobacco abuse, in remission     30 pack years; quit in 1985  . Arteriosclerotic cardiovascular disease (ASCVD)     DES to LAD D1 - 2003 in [place]; failed intervention for a totally obstructed RCA at that time; EF of 45%; 12/2009: Equivocal stress nuclear with good exercise tolerance, negative stress EKG, normal EF with mild mid and distal inferior ischemia  . Hyperlipidemia     Lipid profile in 09/2011:128, 130, 33, 69  . Coronary artery disease      Allergies  Allergen Reactions  . Rosuvastatin Other (See Comments)    Adverse GI symptoms     Current Outpatient Prescriptions  Medication Sig Dispense Refill  . acetaminophen (TYLENOL) 500 MG tablet Take 1,000 mg by mouth every 6 (six) hours as needed for mild pain.    Marland Kitchen aspirin 81 MG tablet Take 81 mg by mouth daily.      Marland Kitchen atorvastatin  (LIPITOR) 40 MG tablet Take 1 tablet (40 mg total) by mouth daily. 90 tablet 1  . betamethasone valerate (VALISONE) 0.1 % cream Apply 1 application topically daily. 30 g 0  . glipiZIDE (GLUCOTROL) 10 MG tablet TAKE 1 TABLET TWICE A DAY BEFORE MEALS 180 tablet 1  . glucose blood (ONE TOUCH ULTRA TEST) test strip Use as instructed twice daily dx e11.9 100 each 5  . Insulin Detemir (LEVEMIR) 100 UNIT/ML Pen 80 units once daily 45 mL 5  . Insulin Pen Needle (B-D ULTRAFINE III SHORT PEN) 31G X 8 MM MISC USE AS DIRECTED WITH LEVEMIR FLEXPEN daily 100 each 11  . loratadine (CLARITIN) 10 MG tablet Take 1 tablet (10 mg total) by mouth daily. 30 tablet 11  . metoprolol succinate (TOPROL-XL) 50 MG 24 hr tablet TAKE 1 TABLET DAILY WITH OR IMMEDIATELY AFTER A MEAL 90 tablet 0  . olmesartan (BENICAR) 20 MG tablet TAKE 1 TABLET DAILY (DOSE REDUCTION) 90 tablet 1  . tadalafil (CIALIS) 5 MG tablet TAKE 1 TABLET (5 MG TOTAL) BY MOUTH DAILY AS NEEDED. 30 tablet 1   No current facility-administered medications for this visit.     Past Surgical History  Procedure Laterality Date  . Pressure ulcer debridement  2006    Right lower extremity  . Epididymis surgery  1996  . Knee  arthroscopy w/ meniscectomy  11/2008    Right  . Rotator cuff repair  07/2009    Right  . Colonoscopy  2010    Negative screening study  . Cardiac catheterization    . Colonoscopy N/A 11/11/2014    Procedure: COLONOSCOPY;  Surgeon: Rogene Houston, MD;  Location: [facility];  Service: Endoscopy;  Laterality: N/A;  1030     Allergies  Allergen Reactions  . Rosuvastatin Other (See Comments)    Adverse GI symptoms      Family History  Problem Relation Age of Onset  . Diabetes Sister   . Arthritis Brother   . Diabetes Brother   . Cancer Father     Hypernephroma     Social History Mr. Head reports that he quit smoking about 32 years ago. His smoking use included Cigarettes. He started smoking about 49 years ago. He has  a 30 pack-year smoking history. He has never used smokeless tobacco. Mr. Hartigan reports that he does not drink alcohol.   Review of Systems CONSTITUTIONAL: No weight loss, fever, chills, weakness or fatigue.  HEENT: Eyes: No visual loss, blurred vision, double vision or yellow sclerae.No hearing loss, sneezing, congestion, runny nose or sore throat.  SKIN: No rash or itching.  CARDIOVASCULAR: per HPI RESPIRATORY: No shortness of breath, cough or sputum.  GASTROINTESTINAL: No anorexia, nausea, vomiting or diarrhea. No abdominal pain or blood.  GENITOURINARY: No burning on urination, no polyuria NEUROLOGICAL: No headache, dizziness, syncope, paralysis, ataxia, numbness or tingling in the extremities. No change in bowel or bladder control.  MUSCULOSKELETAL: No muscle, back pain, joint pain or stiffness.  LYMPHATICS: No enlarged nodes. No history of splenectomy.  PSYCHIATRIC: No history of depression or anxiety.  ENDOCRINOLOGIC: No reports of sweating, cold or heat intolerance. No polyuria or polydipsia.  Marland Kitchen   Physical Examination Filed Vitals:   09/23/15 1621  BP: 110/70  Pulse: 98   Filed Vitals:   09/23/15 1621  Height: 5\' 9"  (1.753 m)  Weight: 226 lb (102.513 kg)    Gen: resting comfortably, no acute distress HEENT: no scleral icterus, pupils equal round and reactive, no palptable cervical adenopathy,  CV: RRR, no m/r/g, no jvd Resp: Clear to auscultation bilaterally GI: abdomen is soft, non-tender, non-distended, normal bowel sounds, no hepatosplenomegaly MSK: extremities are warm, no edema.  Skin: warm, no rash Neuro:  no focal deficits Psych: appropriate affect   Diagnostic Studies 08/2014 Exercise Nuclear stress  Low risk Duke treadmill score of 7.  Blood pressure demonstrated a hypertensive response to exercise.  There was no ST segment deviation noted during stress.  There is a small defect of mild severity present in the basal inferolateral, mid inferolateral  and apical inferior location. The defect is reversible. Consistent with mild, predominantly mid to basal inferolateral ischemia.  Myocardial perfusion is abnormal. This is a low risk study.    Assessment and Plan  1. CAD - recent atypical symptoms, we will continue to monitor at this time.  - continue current meds  2. HTN - at goal, continue current meds  3. Hyperlipidemia - continue current statin. We will request pcp labs.       F/u 1 year      Arnoldo Lenis, M.D.

## 2015-09-23 NOTE — Patient Instructions (Signed)
Medication Instructions:  Your physician recommends that you continue on your current medications as directed. Please refer to the Current Medication list given to you today.   Labwork: NONE  Testing/Procedures: NONE  Follow-Up: Your physician recommends that you schedule a follow-up appointment in: 2 MONTHS    Any Other Special Instructions Will Be Listed Below (If Applicable).     If you need a refill on your cardiac medications before your next appointment, please call your pharmacy.   

## 2015-09-29 ENCOUNTER — Other Ambulatory Visit (HOSPITAL_COMMUNITY): Payer: Self-pay | Admitting: Respiratory Therapy

## 2015-09-29 DIAGNOSIS — G4733 Obstructive sleep apnea (adult) (pediatric): Secondary | ICD-10-CM

## 2015-10-20 ENCOUNTER — Ambulatory Visit: Payer: BLUE CROSS/BLUE SHIELD | Attending: Neurology | Admitting: Neurology

## 2015-10-20 DIAGNOSIS — R0683 Snoring: Secondary | ICD-10-CM | POA: Insufficient documentation

## 2015-10-20 DIAGNOSIS — Z794 Long term (current) use of insulin: Secondary | ICD-10-CM | POA: Insufficient documentation

## 2015-10-20 DIAGNOSIS — Z79899 Other long term (current) drug therapy: Secondary | ICD-10-CM | POA: Diagnosis not present

## 2015-10-20 DIAGNOSIS — Z7982 Long term (current) use of aspirin: Secondary | ICD-10-CM | POA: Insufficient documentation

## 2015-10-20 DIAGNOSIS — G4733 Obstructive sleep apnea (adult) (pediatric): Secondary | ICD-10-CM | POA: Diagnosis present

## 2015-10-31 NOTE — Procedures (Signed)
[place] A. Merlene Laughter, MD     [email or web address]             NOCTURNAL POLYSOMNOGRAPHY   LOCATION: [facility]  Patient Name: Robert [name], Robert [name] Date: 10/20/2015 Gender: Male D.O.B: 1950/06/14 Age (years): 59 Referring Provider: Not Available Height (inches): 69 Interpreting Physician: Phillips Odor MD, ABSM Weight (lbs): 226 RPSGT: Peak, Robert BMI: 33 MRN: [id number] Neck Size: 16.50 CLINICAL INFORMATION Sleep Study Type: NPSG Indication for sleep study: N/A Epworth Sleepiness Score: 6 SLEEP STUDY TECHNIQUE As per the AASM Manual for the Scoring of Sleep and Associated Events v2.3 (April 2016) with a hypopnea requiring 4% desaturations. The channels recorded and monitored were frontal, central and occipital EEG, electrooculogram (EOG), submentalis EMG (chin), nasal and oral airflow, thoracic and abdominal wall motion, anterior tibialis EMG, snore microphone, electrocardiogram, and pulse oximetry. MEDICATIONS Patient's medications include: N/A. Medications self-administered by patient during sleep study : No sleep medicine administered.  Current Outpatient Prescriptions:  .  acetaminophen (TYLENOL) 500 MG tablet, Take 1,000 mg by mouth every 6 (six) hours as needed for mild pain., Disp: , Rfl:  .  aspirin 81 MG tablet, Take 81 mg by mouth daily.  , Disp: , Rfl:  .  atorvastatin (LIPITOR) 40 MG tablet, Take 1 tablet (40 mg total) by mouth daily., Disp: 90 tablet, Rfl: 1 .  betamethasone valerate (VALISONE) 0.1 % cream, Apply 1 application topically daily., Disp: 30 g, Rfl: 0 .  glipiZIDE (GLUCOTROL) 10 MG tablet, TAKE 1 TABLET TWICE A DAY BEFORE MEALS, Disp: 180 tablet, Rfl: 1 .  glucose blood (ONE TOUCH ULTRA TEST) test strip, Use as instructed twice daily dx e11.9, Disp: 100 each, Rfl: 5 .  Insulin Detemir (LEVEMIR) 100 UNIT/ML Pen, 80 units once daily, Disp: 45 mL, Rfl: 5 .  Insulin Pen Needle (B-D ULTRAFINE III SHORT PEN) 31G X 8 MM MISC, USE AS  DIRECTED WITH LEVEMIR FLEXPEN daily, Disp: 100 each, Rfl: 11 .  loratadine (CLARITIN) 10 MG tablet, Take 1 tablet (10 mg total) by mouth daily., Disp: 30 tablet, Rfl: 11 .  metoprolol succinate (TOPROL-XL) 50 MG 24 hr tablet, TAKE 1 TABLET DAILY WITH OR IMMEDIATELY AFTER A MEAL, Disp: 90 tablet, Rfl: 0 .  olmesartan (BENICAR) 20 MG tablet, TAKE 1 TABLET DAILY (DOSE REDUCTION), Disp: 90 tablet, Rfl: 1 .  tadalafil (CIALIS) 5 MG tablet, TAKE 1 TABLET (5 MG TOTAL) BY MOUTH DAILY AS NEEDED., Disp: 30 tablet, Rfl: 1  SLEEP ARCHITECTURE The study was initiated at 9:40:35 PM and ended at 5:16:44 AM. Sleep onset time was 11.3 minutes and the sleep efficiency was 78.2%. The total sleep time was 356.5 minutes. Stage REM latency was 160.0 minutes. The patient spent 17.25% of the night in stage N1 sleep, 70.69% in stage N2 sleep, 1.26% in stage N3 and 10.80% in REM. Alpha intrusion was absent. Supine sleep was 12.62%. RESPIRATORY PARAMETERS The overall apnea/hypopnea index (AHI) was 10 per hour. There were 9 total apneas, including 7 obstructive, 2 central and 0 mixed apneas. There were 49 hypopneas and 98 RERAs. The AHI during Stage REM sleep was 0.0 per hour. AHI while supine was 53.4 per hour. The mean oxygen saturation was 93.62%. The minimum SpO2 during sleep was 88.00%. Soft snoring was noted during this study. CARDIAC DATA The 2 lead EKG demonstrated sinus rhythm. The mean heart rate was 70.85 beats per minute. Other EKG findings include: None. LEG MOVEMENT DATA The total PLMS were 0 with a resulting PLMS index of  0.00. Associated arousal with leg movement index was 0.0.  IMPRESSIONS - Mild obstructive sleep apnea. Consider trial of AutoPAP 5- 12.     Delano Metz, MD Diplomate, American Board of Sleep Medicine.

## 2015-11-16 ENCOUNTER — Encounter: Payer: Self-pay | Admitting: Cardiology

## 2015-12-09 ENCOUNTER — Ambulatory Visit (INDEPENDENT_AMBULATORY_CARE_PROVIDER_SITE_OTHER): Payer: BLUE CROSS/BLUE SHIELD

## 2015-12-09 DIAGNOSIS — Z23 Encounter for immunization: Secondary | ICD-10-CM

## 2015-12-10 ENCOUNTER — Ambulatory Visit: Payer: BLUE CROSS/BLUE SHIELD

## 2015-12-13 ENCOUNTER — Ambulatory Visit: Payer: BLUE CROSS/BLUE SHIELD | Admitting: Cardiology

## 2015-12-20 ENCOUNTER — Other Ambulatory Visit: Payer: Self-pay

## 2015-12-20 MED ORDER — METOPROLOL SUCCINATE ER 50 MG PO TB24: ORAL_TABLET | ORAL | 1 refills | Status: DC

## 2016-01-04 ENCOUNTER — Ambulatory Visit (INDEPENDENT_AMBULATORY_CARE_PROVIDER_SITE_OTHER): Payer: BLUE CROSS/BLUE SHIELD | Admitting: Cardiology

## 2016-01-04 ENCOUNTER — Encounter: Payer: Self-pay | Admitting: Cardiology

## 2016-01-04 VITALS — BP 120/70 | HR 83 | Ht 69.0 in | Wt 230.0 lb

## 2016-01-04 DIAGNOSIS — E782 Mixed hyperlipidemia: Secondary | ICD-10-CM

## 2016-01-04 DIAGNOSIS — I10 Essential (primary) hypertension: Secondary | ICD-10-CM | POA: Diagnosis not present

## 2016-01-04 DIAGNOSIS — I2510 Atherosclerotic heart disease of native coronary artery without angina pectoris: Secondary | ICD-10-CM

## 2016-01-04 NOTE — Progress Notes (Signed)
Clinical Summary Mr. Maggart is a 65 y.o.male seen today for follow up of the following medical problems.   1. CAD - history of prior stenting as described below -08/2014 exercise nuclear stress with Duke treadmill score of 7 consistent with low risk, small area of inferolateral ischemia. Mild inferior ischemia has been noted previously in 12/2009 stress, from notes he has history of RCA CTO that was not able to be opened during 2003 cath in [place].   - occasional left sided chest pain. Dull pain, 2-3/10. No other associated symptoms. Not positional. Lasts just a few seconds. Sporadic, can occur at rest or with exertion. Ongoing for several years.   - symptoms unchanged since last visit.  - compliant with meds  2. HTN - does not check blood pressure regularly at home - compliant meds  3. Hyperlipidemia - compliant with statin - last lipid panel 08/2015 TC 110 TG 131 HDL 37 LDL 47  4. DM II - followed by Dr Moshe Cipro  5. OSA - sleep study showed mild OSA, compliant with cpap    SH: recent visit to [facility]. Past Medical History:  Diagnosis Date  . Arteriosclerotic cardiovascular disease (ASCVD)    DES to LAD D1 - 2003 in [place]; failed intervention for a totally obstructed RCA at that time; EF of 45%; 12/2009: Equivocal stress nuclear with good exercise tolerance, negative stress EKG, normal EF with mild mid and distal inferior ischemia  . Coronary artery disease   . Diabetes mellitus, type II ([place])   . Hyperlipidemia    Lipid profile in 09/2011:128, 130, 33, 69  . Hypertension    Lab  09/2011: Normal CMet ex G-133  . Tobacco abuse, in remission    30 pack years; quit in 1985     Allergies  Allergen Reactions  . Rosuvastatin Other (See Comments)    Adverse GI symptoms     Current Outpatient Prescriptions  Medication Sig Dispense Refill  . acetaminophen (TYLENOL) 500 MG tablet Take 1,000 mg by mouth every 6 (six) hours as needed for mild pain.    Marland Kitchen aspirin  81 MG tablet Take 81 mg by mouth daily.      Marland Kitchen atorvastatin (LIPITOR) 40 MG tablet Take 1 tablet (40 mg total) by mouth daily. 90 tablet 1  . betamethasone valerate (VALISONE) 0.1 % cream Apply 1 application topically daily. 30 g 0  . glipiZIDE (GLUCOTROL) 10 MG tablet TAKE 1 TABLET TWICE A DAY BEFORE MEALS 180 tablet 1  . glucose blood (ONE TOUCH ULTRA TEST) test strip Use as instructed twice daily dx e11.9 100 each 5  . Insulin Detemir (LEVEMIR) 100 UNIT/ML Pen 80 units once daily 45 mL 5  . Insulin Pen Needle (B-D ULTRAFINE III SHORT PEN) 31G X 8 MM MISC USE AS DIRECTED WITH LEVEMIR FLEXPEN daily 100 each 11  . loratadine (CLARITIN) 10 MG tablet Take 1 tablet (10 mg total) by mouth daily. 30 tablet 11  . metoprolol succinate (TOPROL-XL) 50 MG 24 hr tablet TAKE 1 TABLET DAILY WITH OR IMMEDIATELY AFTER A MEAL 90 tablet 1  . olmesartan (BENICAR) 20 MG tablet TAKE 1 TABLET DAILY (DOSE REDUCTION) 90 tablet 1  . tadalafil (CIALIS) 5 MG tablet TAKE 1 TABLET (5 MG TOTAL) BY MOUTH DAILY AS NEEDED. 30 tablet 1   No current facility-administered medications for this visit.      Past Surgical History:  Procedure Laterality Date  . CARDIAC CATHETERIZATION    . COLONOSCOPY  2010   Negative screening study  . COLONOSCOPY N/A 11/11/2014   Procedure: COLONOSCOPY;  Surgeon: Rogene Houston, MD;  Location: [facility];  Service: Endoscopy;  Laterality: N/A;  1030  . EPIDIDYMIS SURGERY  1996  . KNEE ARTHROSCOPY W/ MENISCECTOMY  11/2008   Right  . PRESSURE ULCER DEBRIDEMENT  2006   Right lower extremity  . ROTATOR CUFF REPAIR  07/2009   Right     Allergies  Allergen Reactions  . Rosuvastatin Other (See Comments)    Adverse GI symptoms      Family History  Problem Relation Age of Onset  . Diabetes Sister   . Arthritis Brother   . Diabetes Brother   . Cancer Father     Hypernephroma     Social History Mr. Rayle reports that he quit smoking about 32 years ago. His smoking use included  Cigarettes. He started smoking about 49 years ago. He has a 30.00 pack-year smoking history. He has never used smokeless tobacco. Mr. Rairigh reports that he does not drink alcohol.   Review of Systems CONSTITUTIONAL: No weight loss, fever, chills, weakness or fatigue.  HEENT: Eyes: No visual loss, blurred vision, double vision or yellow sclerae.No hearing loss, sneezing, congestion, runny nose or sore throat.  SKIN: No rash or itching.  CARDIOVASCULAR: RRR, no m/r/g, no jvd RESPIRATORY: No shortness of breath, cough or sputum.  GASTROINTESTINAL: No anorexia, nausea, vomiting or diarrhea. No abdominal pain or blood.  GENITOURINARY: No burning on urination, no polyuria NEUROLOGICAL: No headache, dizziness, syncope, paralysis, ataxia, numbness or tingling in the extremities. No change in bowel or bladder control.  MUSCULOSKELETAL: No muscle, back pain, joint pain or stiffness.  LYMPHATICS: No enlarged nodes. No history of splenectomy.  PSYCHIATRIC: No history of depression or anxiety.  ENDOCRINOLOGIC: No reports of sweating, cold or heat intolerance. No polyuria or polydipsia.  Marland Kitchen   Physical Examination Vitals:   01/04/16 0937  BP: 120/70  Pulse: 83   Vitals:   01/04/16 0937  Weight: 230 lb (104.3 kg)  Height: 5\' 9"  (1.753 m)    Gen: resting comfortably, no acute distress HEENT: no scleral icterus, pupils equal round and reactive, no palptable cervical adenopathy,  CV: RRR, no m/r/g, no jvd Resp: Clear to auscultation bilaterally GI: abdomen is soft, non-tender, non-distended, normal bowel sounds, no hepatosplenomegaly MSK: extremities are warm, no edema.  Skin: warm, no rash Neuro:  no focal deficits Psych: appropriate affect   Diagnostic Studies 08/2014 Exercise Nuclear stress  Low risk Duke treadmill score of 7.  Blood pressure demonstrated a hypertensive response to exercise.  There was no ST segment deviation noted during stress.  There is a small defect of mild  severity present in the basal inferolateral, mid inferolateral and apical inferior location. The defect is reversible. Consistent with mild, predominantly mid to basal inferolateral ischemia.  Myocardial perfusion is abnormal. This is a low risk study.    Assessment and Plan  1. CAD - recent atypical symptoms, overall unchanged. Continue to monitor - continue current meds  2. HTN - at goal, he will continue current meds  3. Hyperlipidemia - continue current statin.He is at goal.       Arnoldo Lenis, M.D.

## 2016-01-04 NOTE — Patient Instructions (Signed)
Your physician wants you to follow-up in: 6 months You will receive a reminder letter in the mail two months in advance. If you don't receive a letter, please call our office to schedule the follow-up appointment.     Your physician recommends that you continue on your current medications as directed. Please refer to the Current Medication list given to you today.      Thank you for choosing [facility] !        

## 2016-01-11 LAB — CBC
HCT: 40 % (ref 38.5–50.0)
HEMOGLOBIN: 12.7 g/dL — AB (ref 13.2–17.1)
MCH: 28.3 pg (ref 27.0–33.0)
MCHC: 31.8 g/dL — ABNORMAL LOW (ref 32.0–36.0)
MCV: 89.1 fL (ref 80.0–100.0)
MPV: 9.6 fL (ref 7.5–12.5)
Platelets: 217 10*3/uL (ref 140–400)
RBC: 4.49 MIL/uL (ref 4.20–5.80)
RDW: 14.6 % (ref 11.0–15.0)
WBC: 5.5 10*3/uL (ref 3.8–10.8)

## 2016-01-12 ENCOUNTER — Encounter: Payer: Self-pay | Admitting: Family Medicine

## 2016-01-12 ENCOUNTER — Ambulatory Visit (INDEPENDENT_AMBULATORY_CARE_PROVIDER_SITE_OTHER): Payer: BLUE CROSS/BLUE SHIELD | Admitting: Family Medicine

## 2016-01-12 VITALS — BP 120/74 | HR 85 | Ht 69.0 in | Wt 231.0 lb

## 2016-01-12 DIAGNOSIS — N182 Chronic kidney disease, stage 2 (mild): Secondary | ICD-10-CM

## 2016-01-12 DIAGNOSIS — G4710 Hypersomnia, unspecified: Secondary | ICD-10-CM

## 2016-01-12 DIAGNOSIS — D539 Nutritional anemia, unspecified: Secondary | ICD-10-CM | POA: Diagnosis not present

## 2016-01-12 DIAGNOSIS — IMO0001 Reserved for inherently not codable concepts without codable children: Secondary | ICD-10-CM

## 2016-01-12 DIAGNOSIS — G4730 Sleep apnea, unspecified: Secondary | ICD-10-CM

## 2016-01-12 DIAGNOSIS — E785 Hyperlipidemia, unspecified: Secondary | ICD-10-CM | POA: Diagnosis not present

## 2016-01-12 DIAGNOSIS — E119 Type 2 diabetes mellitus without complications: Secondary | ICD-10-CM | POA: Diagnosis not present

## 2016-01-12 DIAGNOSIS — E669 Obesity, unspecified: Secondary | ICD-10-CM

## 2016-01-12 DIAGNOSIS — I129 Hypertensive chronic kidney disease with stage 1 through stage 4 chronic kidney disease, or unspecified chronic kidney disease: Secondary | ICD-10-CM

## 2016-01-12 DIAGNOSIS — Z794 Long term (current) use of insulin: Secondary | ICD-10-CM

## 2016-01-12 DIAGNOSIS — E1122 Type 2 diabetes mellitus with diabetic chronic kidney disease: Secondary | ICD-10-CM | POA: Diagnosis not present

## 2016-01-12 LAB — COMPLETE METABOLIC PANEL WITH GFR
ALT: 18 U/L (ref 9–46)
AST: 15 U/L (ref 10–35)
Albumin: 3.7 g/dL (ref 3.6–5.1)
Alkaline Phosphatase: 113 U/L (ref 40–115)
BUN: 12 mg/dL (ref 7–25)
CHLORIDE: 104 mmol/L (ref 98–110)
CO2: 26 mmol/L (ref 20–31)
Calcium: 8.9 mg/dL (ref 8.6–10.3)
Creat: 1.23 mg/dL (ref 0.70–1.25)
GFR, Est African American: 71 mL/min (ref >=60)
GFR, Est Non African American: 62 mL/min (ref >=60)
GLUCOSE: 153 mg/dL — AB (ref 65–99)
POTASSIUM: 4.6 mmol/L (ref 3.5–5.3)
SODIUM: 138 mmol/L (ref 135–146)
Total Bilirubin: 0.4 mg/dL (ref 0.2–1.2)
Total Protein: 6.6 g/dL (ref 6.1–8.1)

## 2016-01-12 LAB — HEMOGLOBIN A1C
HEMOGLOBIN A1C: 8 % — AB (ref <5.7)
Mean Plasma Glucose: 183 mg/dL

## 2016-01-12 LAB — TSH: TSH: 1.07 mIU/L (ref 0.40–4.50)

## 2016-01-12 LAB — LIPID PANEL
Cholesterol: 137 mg/dL (ref 125–200)
HDL: 35 mg/dL — ABNORMAL LOW (ref >=40)
LDL CALC: 57 mg/dL (ref <130)
TRIGLYCERIDES: 227 mg/dL — AB (ref <150)
Total CHOL/HDL Ratio: 3.9 Ratio (ref <=5.0)
VLDL: 45 mg/dL — ABNORMAL HIGH (ref <30)

## 2016-01-12 LAB — VITAMIN D 25 HYDROXY (VIT D DEFICIENCY, FRACTURES): Vit D, 25-Hydroxy: 27 ng/mL — ABNORMAL LOW (ref 30–100)

## 2016-01-12 NOTE — Patient Instructions (Addendum)
F/u in 3 month, call if you need me before  NEED to stop sweet tea and sweet drinks and reduce fried and fatty foods  Increase lev emir to 75 units   You are referred to diabetic educator, vERY iMPORTANT that you do go, ask wife to go with you so she can hear and understand what you need to do and help you  Commit to 30 mins walking every afternoon.  Work with [employer] equipment please  Start OTC iron 325 mg one daily today, you are mildly anemic  Fasting lipid, cmp and eGFR, hBA1C, cBC, iron and ferritin in 3 months  Thank you  for choosing [facility]. We consider it a privelige to serve you.  Delivering excellent health care in a caring and  compassionate way is our goal.  Partnering with you,  so that together we can achieve this goal is our strategy.

## 2016-01-16 ENCOUNTER — Encounter: Payer: Self-pay | Admitting: Family Medicine

## 2016-01-16 DIAGNOSIS — G4710 Hypersomnia, unspecified: Secondary | ICD-10-CM | POA: Insufficient documentation

## 2016-01-16 DIAGNOSIS — G4730 Sleep apnea, unspecified: Secondary | ICD-10-CM

## 2016-01-16 NOTE — Assessment & Plan Note (Signed)
Deteriorated. Patient re-educated about  the importance of commitment to a  minimum of 150 minutes of exercise per week.  The importance of healthy food choices with portion control discussed. Encouraged to start a food diary, count calories and to consider  joining a support group. Sample diet sheets offered. Goals set by the patient for the next several months.   Weight /BMI 01/12/2016 01/04/2016 10/20/2015  WEIGHT 231 lb 230 lb 226 lb  HEIGHT 5\' 9"  5\' 9"  5\' 9"   BMI 34.11 kg/m2 33.97 kg/m2 33.36 kg/m2

## 2016-01-16 NOTE — Assessment & Plan Note (Signed)
[name] is reminded of the importance of commitment to daily physical activity for 30 minutes or more, as able and the need to limit carbohydrate intake to 30 to 60 grams per meal to help with blood sugar control.   The need to take medication as prescribed, test blood sugar as directed, and to call between visits if there is a concern that blood sugar is uncontrolled is also discussed.   Robert [name] is reminded of the importance of daily foot exam, annual eye examination, and good blood sugar, blood pressure and cholesterol control. Deteriorated, inrease med refer to diabetic ed, and re educated re lifestyle changes needed  Diabetic Labs Latest Ref Rng & Units 01/11/2016 08/09/2015 04/09/2015 12/31/2014 08/06/2014  HbA1c <5.7 % 8.0(H) 7.5(H) 7.4(H) 7.7(H) 7.5(H)  Microalbumin <2.0 mg/dL - - - 2.5(H) -  Micro/Creat Ratio 0.0 - 30.0 mg/g - - - 10.2 -  Chol 125 - 200 mg/dL 137 110(L) 138 112(L) -  HDL >=40 mg/dL 35(L) 37(L) 37(L) 30(L) -  Calc LDL <130 mg/dL 57 47 62 49 -  Triglycerides <150 mg/dL 227(H) 131 194(H) 164(H) -  Creatinine 0.70 - 1.25 mg/dL 1.23 1.14 1.34(H) 1.16 1.16   BP/Weight 01/12/2016 01/04/2016 10/20/2015 09/23/2015 08/11/2015 04/13/2015 99991111  Systolic BP 123456 123456 - [id number] 123456 [id number]  Diastolic BP 74 70 - 70 72 70 78  Wt. (Lbs) 231 230 226 226 223 225 229  BMI 34.11 33.97 33.36 33.36 32.92 33.21 33.8   Foot/eye exam completion dates Latest Ref Rng & Units 04/13/2015 03/18/2015  Eye Exam No Retinopathy - No Retinopathy  Foot exam Order - - -  Foot Form Completion - Done -

## 2016-01-16 NOTE — Assessment & Plan Note (Signed)
Inadequately treated as pt reports inability to use CPAP as directed , however, working on this and followed by neurology

## 2016-01-16 NOTE — Assessment & Plan Note (Signed)
Controlled, no change in medication DASH diet and commitment to daily physical activity for a minimum of 30 minutes discussed and encouraged, as a part of hypertension management. The importance of attaining a healthy weight is also discussed.  BP/Weight 01/12/2016 01/04/2016 10/20/2015 09/23/2015 08/11/2015 04/13/2015 [id number]  Systolic BP 123456 123456 - [id number] 123456 [id number]  Diastolic BP 74 70 - 70 72 70 78  Wt. (Lbs) 231 230 226 226 223 225 229  BMI 34.11 33.97 33.36 33.36 32.92 33.21 33.8

## 2016-01-16 NOTE — Assessment & Plan Note (Signed)
Hyperlipidemia:Low fat diet discussed and encouraged.   Lipid Panel  Lab Results  Component Value Date   CHOL 137 01/11/2016   HDL 35 (L) 01/11/2016   LDLCALC 57 01/11/2016   TRIG 227 (H) 01/11/2016   CHOLHDL 3.9 01/11/2016   Uncontrolled , dietary modification needed to reduce TG

## 2016-01-16 NOTE — Progress Notes (Signed)
Robert [name]     MRN: [id number]      DOB: Jul 17, 1950   HPI [name] is here for follow up and re-evaluation of chronic medical conditions, medication management and review of any available recent lab and radiology data.  Preventive health is updated, specifically  Cancer screening and Immunization.   Questions or concerns regarding consultations or procedures which the PT has had in the interim are  addressed. The PT denies any adverse reactions to current medications since the last visit.  There are no new concerns.  Ongoing c/o fatigue and depression screen is negative. Has had negative cardiology eval since last visit. New dx of sleep apnea and struggling to use the machine, which he clearly needs Blood ugars not being checked as he should, still drinking sweet tea AND  Generally not following through on any lifestyle changes we have discussed in the past on more than one occasion  ROS Denies recent fever or chills. Denies sinus pressure, nasal congestion, ear pain or sore throat. Denies chest congestion, productive cough or wheezing. Denies chest pains, palpitations and leg swelling Denies abdominal pain, nausea, vomiting,diarrhea or constipation.   Denies dysuria, frequency, hesitancy or incontinence. Denies joint pain, swelling and limitation in mobility. Denies headaches, seizures, numbness, or tingling. Denies depression, anxiety or insomnia. Denies skin break down or rash.   PE  BP 120/74   Pulse 85   Ht 5\' 9"  (1.753 m)   Wt 231 lb (104.8 kg)   SpO2 94%   BMI 34.11 kg/m   Patient alert and oriented and in no cardiopulmonary distress.  HEENT: No facial asymmetry, EOMI,   oropharynx pink and moist.  Neck supple no JVD, no mass.  Chest: Clear to auscultation bilaterally.  CVS: S1, S2 no murmurs, no S3.Regular rate.  ABD: Soft non tender.   Ext: No edema  MS: Adequate though reduced ROM spine, shoulders, hips and knees.  Skin: Intact, no ulcerations or  rash noted.  Psych: Good eye contact, flat  affect. Memory intact not anxious or depressed appearing.  CNS: CN 2-12 intact, power,  normal throughout.no focal deficits noted.   Assessment & Plan Hypertension associated with stage 2 chronic kidney disease due to type 2 diabetes mellitus Controlled, no change in medication DASH diet and commitment to daily physical activity for a minimum of 30 minutes discussed and encouraged, as a part of hypertension management. The importance of attaining a healthy weight is also discussed.  BP/Weight 01/12/2016 01/04/2016 10/20/2015 09/23/2015 08/11/2015 04/13/2015 99991111  Systolic BP 123456 123456 - [id number] 123456 [id number]  Diastolic BP 74 70 - 70 72 70 78  Wt. (Lbs) 231 230 226 226 223 225 229  BMI 34.11 33.97 33.36 33.36 32.92 33.21 33.8       Diabetes mellitus, insulin dependent (IDDM), controlled [name] is reminded of the importance of commitment to daily physical activity for 30 minutes or more, as able and the need to limit carbohydrate intake to 30 to 60 grams per meal to help with blood sugar control.   The need to take medication as prescribed, test blood sugar as directed, and to call between visits if there is a concern that blood sugar is uncontrolled is also discussed.   [name] is reminded of the importance of daily foot exam, annual eye examination, and good blood sugar, blood pressure and cholesterol control. Deteriorated, inrease med refer to diabetic ed, and re educated re lifestyle changes needed  Diabetic Labs Latest Ref Rng &  Units 01/11/2016 08/09/2015 04/09/2015 12/31/2014 08/06/2014  HbA1c <5.7 % 8.0(H) 7.5(H) 7.4(H) 7.7(H) 7.5(H)  Microalbumin <2.0 mg/dL - - - 2.5(H) -  Micro/Creat Ratio 0.0 - 30.0 mg/g - - - 10.2 -  Chol 125 - 200 mg/dL 137 110(L) 138 112(L) -  HDL >=40 mg/dL 35(L) 37(L) 37(L) 30(L) -  Calc LDL <130 mg/dL 57 47 62 49 -  Triglycerides <150 mg/dL 227(H) 131 194(H) 164(H) -  Creatinine 0.70 - 1.25 mg/dL 1.23 1.14  1.34(H) 1.16 1.16   BP/Weight 01/12/2016 01/04/2016 10/20/2015 09/23/2015 08/11/2015 04/13/2015 99991111  Systolic BP 123456 123456 - [id number] 123456 [id number]  Diastolic BP 74 70 - 70 72 70 78  Wt. (Lbs) 231 230 226 226 223 225 229  BMI 34.11 33.97 33.36 33.36 32.92 33.21 33.8   Foot/eye exam completion dates Latest Ref Rng & Units 04/13/2015 03/18/2015  Eye Exam No Retinopathy - No Retinopathy  Foot exam Order - - -  Foot Form Completion - Done -        Obesity (BMI 30-39.9) Deteriorated. Patient re-educated about  the importance of commitment to a  minimum of 150 minutes of exercise per week.  The importance of healthy food choices with portion control discussed. Encouraged to start a food diary, count calories and to consider  joining a support group. Sample diet sheets offered. Goals set by the patient for the next several months.   Weight /BMI 01/12/2016 01/04/2016 10/20/2015  WEIGHT 231 lb 230 lb 226 lb  HEIGHT 5\' 9"  5\' 9"  5\' 9"   BMI 34.11 kg/m2 33.97 kg/m2 33.36 kg/m2      Hyperlipidemia LDL goal <70 Hyperlipidemia:Low fat diet discussed and encouraged.   Lipid Panel  Lab Results  Component Value Date   CHOL 137 01/11/2016   HDL 35 (L) 01/11/2016   LDLCALC 57 01/11/2016   TRIG 227 (H) 01/11/2016   CHOLHDL 3.9 01/11/2016   Uncontrolled , dietary modification needed to reduce TG    Hypersomnia with sleep apnea Inadequately treated as pt reports inability to use CPAP as directed , however, working on this and followed by neurology

## 2016-02-01 ENCOUNTER — Encounter: Payer: Self-pay | Admitting: Nutrition

## 2016-02-01 ENCOUNTER — Encounter: Payer: BLUE CROSS/BLUE SHIELD | Attending: Family Medicine | Admitting: Nutrition

## 2016-02-01 VITALS — Ht 69.0 in | Wt 230.0 lb

## 2016-02-01 DIAGNOSIS — Z794 Long term (current) use of insulin: Secondary | ICD-10-CM | POA: Diagnosis not present

## 2016-02-01 DIAGNOSIS — E119 Type 2 diabetes mellitus without complications: Secondary | ICD-10-CM | POA: Diagnosis not present

## 2016-02-01 DIAGNOSIS — E118 Type 2 diabetes mellitus with unspecified complications: Secondary | ICD-10-CM

## 2016-02-01 DIAGNOSIS — Z713 Dietary counseling and surveillance: Secondary | ICD-10-CM | POA: Diagnosis not present

## 2016-02-01 DIAGNOSIS — E669 Obesity, unspecified: Secondary | ICD-10-CM

## 2016-02-01 DIAGNOSIS — IMO0002 Reserved for concepts with insufficient information to code with codable children: Secondary | ICD-10-CM

## 2016-02-01 DIAGNOSIS — E1165 Type 2 diabetes mellitus with hyperglycemia: Secondary | ICD-10-CM

## 2016-02-01 NOTE — Progress Notes (Signed)
  Medical Nutrition Therapy:  Appt start time: 1330 end time:  1430.  Assessment:  Primary concerns today: Diabetes Type 2. Here with his wife.  Most A1C 8%.  Eats three meals per day.  His wife is with him. Eat out 2-3 times per week.  Takes 75 Units Levermir daily and Gluctorol 10 mg BID.  Tests blood sugars every now and then. Takes insulin in am.  Diet is excessive in CHO, protein, high in fat, sodium and low in fresh fruits and low carb vegetables.   Lab Results  Component Value Date   HGBA1C 8.0 (H) 01/11/2016      Preferred Learning Style: Auditory  No preference indicated   Learning Readiness:  Ready  Change in progress   MEDICATIONS: See List   DIETARY INTAKE:   24-hr recall:  B ( AM): bacon 2,eggs  2 and oatmeal 1cup, Diet Green tea  Snk ( AM): banana L ( PM): Hamburger with bun and toss salad with Honey Mustard,  Green Tea Diet Snk ( PM):  Banana D ( PM):  Soup- Vegetables with chicken, 1 cup, Hamburger with bun Snk ( PM): apple Beverages: Water, Diet Green Tea, sweet tea  Usual physical activity: None  Estimated energy needs: 1800-2000 calories 200 g carbohydrates 135 g protein 50 g fat  Progress Towards Goal(s):  In progress.   Nutritional Diagnosis:  NB-1.1 Food and nutrition-related knowledge deficit As related to Diabetes Type 2.  As evidenced by A1C 8%.    Intervention:  Nutrition and Diabetes education provided on My Plate, CHO counting, meal planning, portion sizes, timing of meals, avoiding snacks between meals unless having a low blood sugar, target ranges for A1C and blood sugars, signs/symptoms and treatment of hyper/hypoglycemia, monitoring blood sugars, taking medications as prescribed, benefits of exercising 30 minutes per day and prevention of complications of DM.   Goals 1. Follow Plate Method 2. Cut out snacks between meals 3. Increase fresh fruits and vegetables. 4. Cut out diet soda 5. DRink only water  Exercise 60 minutes 4  days per week Get A1C down to 7%  Cut out fried and processed foods  Take Levemir 75 units at night instead of am. Inject in abdomen. Get new test strips Test BS in am and at bedtime.   Teaching Method Utilized:  Visual Auditory Hands on  Handouts given during visit include:  Plate Method  Meal Plan Card  Diabetes Instructions.   Barriers to learning/adherence to lifestyle change: none  Demonstrated degree of understanding via:  Teach Back   Monitoring/Evaluation:  Dietary intake, exercise, meal planning, SBG, and body weight in 1 month(s).

## 2016-02-01 NOTE — Patient Instructions (Signed)
Goals 1. Follow Plate Method 2. Cut out snacks between meals 3. Increase fresh fruits and vegetables. 4. Cut out diet soda 5. DRink only water  Exercise 60 minutes 4 days per week Get A1C down to 7%  Cut out fried and processed foods  Take Levemir 75 units at night instead of am. Inject in abdomen. Get new test strips Test BS in am and at bedtime.

## 2016-02-09 ENCOUNTER — Other Ambulatory Visit: Payer: Self-pay

## 2016-02-09 MED ORDER — GLUCOSE BLOOD VI STRP: ORAL_STRIP | 5 refills | Status: DC

## 2016-03-15 ENCOUNTER — Encounter: Payer: BLUE CROSS/BLUE SHIELD | Attending: Family Medicine | Admitting: Nutrition

## 2016-03-15 VITALS — Ht 69.0 in | Wt 224.0 lb

## 2016-03-15 DIAGNOSIS — Z713 Dietary counseling and surveillance: Secondary | ICD-10-CM | POA: Diagnosis present

## 2016-03-15 DIAGNOSIS — E119 Type 2 diabetes mellitus without complications: Secondary | ICD-10-CM | POA: Insufficient documentation

## 2016-03-15 DIAGNOSIS — E669 Obesity, unspecified: Secondary | ICD-10-CM

## 2016-03-15 DIAGNOSIS — E1165 Type 2 diabetes mellitus with hyperglycemia: Secondary | ICD-10-CM

## 2016-03-15 DIAGNOSIS — IMO0002 Reserved for concepts with insufficient information to code with codable children: Secondary | ICD-10-CM

## 2016-03-15 DIAGNOSIS — Z794 Long term (current) use of insulin: Secondary | ICD-10-CM

## 2016-03-15 DIAGNOSIS — E118 Type 2 diabetes mellitus with unspecified complications: Secondary | ICD-10-CM

## 2016-03-15 NOTE — Progress Notes (Signed)
  Medical Nutrition Therapy:  Appt start time: 1330 end time:  1430.  Assessment:  Primary concerns today: Diabetes Type 2. Lost 6 lbs.  Meter brought in. Avg 113 mg/dl x 30 day.  75 units Levemir and 10 mg of Glucotrol daily. Downloaded meter. He is eating meals consistently. BS much better.  Has had a few low blood sugars with symptoms and find that he needs to eat.  A1C to be done in January 2018.   Lab Results  Component Value Date   HGBA1C 8.0 (H) 01/11/2016      Preferred Learning Style: Auditory  No preference indicated   Learning Readiness:  Ready  Change in progress   MEDICATIONS: See List   DIETARY INTAKE:   24-hr recall:  B ( AM): Eggs, sausage [place], oatmeal and toast or 2 waffles , water  Snk ( AM): L ( PM): Pigs in blanket 3,  And banana OR Sandwich-chicken salad on wheat and salad and fruit and water Snk ( PM):   D ( PM):   Vegetables soup, 3 pigs in blanket and chili beans,  Snk ( PM): apple Beverages: Water, Diet Green Tea, sweet tea  Usual physical activity: None  Estimated energy needs: 1800-2000 calories 200 g carbohydrates 135 g protein 50 g fat  Progress Towards Goal(s):  In progress.   Nutritional Diagnosis:  NB-1.1 Food and nutrition-related knowledge deficit As related to Diabetes Type 2.  As evidenced by A1C 8%.    Intervention:  Nutrition and Diabetes education provided on My Plate, CHO counting, meal planning, portion sizes, timing of meals, avoiding snacks between meals unless having a low blood sugar, target ranges for A1C and blood sugars, signs/symptoms and treatment of hyper/hypoglycemia, monitoring blood sugars, taking medications as prescribed, benefits of exercising 30 minutes per day and prevention of complications of DM.  Meal planning    Goals 1 Exercise 30 minutes daily 2. Talk to Dr. Moshe Cipro about stopping Glucotrol or switching to Amaryl 3. Increase low carb vegetables 4. Choose nuts, sting cheese or veggies snacks  between meals 5. Get A1C to 7% or less 6. Lose 1 lb per week; 10 lbs over the next 3 months   Teaching Method Utilized:  Visual Auditory Hands on  Handouts given during visit include:  Plate Method  Meal Plan Card  Diabetes Instructions.   Barriers to learning/adherence to lifestyle change: none  Demonstrated degree of understanding via:  Teach Back   Monitoring/Evaluation:  Dietary intake, exercise, meal planning, SBG, and body weight in 3 month(s).  Would recommend to stop Glucotrol and reduce Levemir 5-10 units since he is losing weight and eating more consistently and BS are much better to prevent hypoglycemia.Marland Kitchen

## 2016-03-15 NOTE — Patient Instructions (Signed)
Goals 1 Exercise 30 minutes daily 2. Talk to Dr. Moshe Cipro about stopping Glucotrol or switching to Amaryl 3. Increase low carb vegetables 4. Choose nuts, sting cheese or veggies snacks between meals 5. Get A1C to 7% or less 6. Lose 1 lb per week; 10 lbs over the next 3 months

## 2016-04-06 ENCOUNTER — Other Ambulatory Visit: Payer: Self-pay | Admitting: Family Medicine

## 2016-04-06 DIAGNOSIS — Z794 Long term (current) use of insulin: Secondary | ICD-10-CM

## 2016-04-06 DIAGNOSIS — E785 Hyperlipidemia, unspecified: Secondary | ICD-10-CM

## 2016-04-06 DIAGNOSIS — IMO0001 Reserved for inherently not codable concepts without codable children: Secondary | ICD-10-CM

## 2016-04-06 DIAGNOSIS — E119 Type 2 diabetes mellitus without complications: Secondary | ICD-10-CM

## 2016-04-11 LAB — HEMOGLOBIN A1C
Hgb A1c MFr Bld: 6.9 % — ABNORMAL HIGH (ref <5.7)
MEAN PLASMA GLUCOSE: 151 mg/dL

## 2016-04-11 LAB — CBC
HCT: 40.5 % (ref 38.5–50.0)
HEMOGLOBIN: 13.3 g/dL (ref 13.2–17.1)
MCH: 28.7 pg (ref 27.0–33.0)
MCHC: 32.8 g/dL (ref 32.0–36.0)
MCV: 87.3 fL (ref 80.0–100.0)
MPV: 9.7 fL (ref 7.5–12.5)
PLATELETS: 238 10*3/uL (ref 140–400)
RBC: 4.64 MIL/uL (ref 4.20–5.80)
RDW: 14.6 % (ref 11.0–15.0)
WBC: 5.2 10*3/uL (ref 3.8–10.8)

## 2016-04-11 LAB — COMPLETE METABOLIC PANEL WITH GFR
ALT: 26 U/L (ref 9–46)
AST: 22 U/L (ref 10–35)
Albumin: 4 g/dL (ref 3.6–5.1)
Alkaline Phosphatase: 111 U/L (ref 40–115)
BILIRUBIN TOTAL: 0.4 mg/dL (ref 0.2–1.2)
BUN: 15 mg/dL (ref 7–25)
CALCIUM: 9.1 mg/dL (ref 8.6–10.3)
CO2: 28 mmol/L (ref 20–31)
CREATININE: 1.33 mg/dL — AB (ref 0.70–1.25)
Chloride: 105 mmol/L (ref 98–110)
GFR, EST AFRICAN AMERICAN: 65 mL/min (ref >=60)
GFR, Est Non African American: 56 mL/min — ABNORMAL LOW (ref >=60)
Glucose, Bld: 78 mg/dL (ref 65–99)
Potassium: 4.5 mmol/L (ref 3.5–5.3)
Sodium: 140 mmol/L (ref 135–146)
Total Protein: 6.8 g/dL (ref 6.1–8.1)

## 2016-04-11 LAB — LIPID PANEL
CHOLESTEROL: 93 mg/dL (ref <200)
HDL: 31 mg/dL — AB (ref >40)
LDL Cholesterol: 43 mg/dL (ref <100)
TRIGLYCERIDES: 96 mg/dL (ref <150)
Total CHOL/HDL Ratio: 3 Ratio (ref <5.0)
VLDL: 19 mg/dL (ref <30)

## 2016-04-11 LAB — IRON: IRON: 36 ug/dL — AB (ref 50–180)

## 2016-04-11 LAB — FERRITIN: FERRITIN: 128 ng/mL (ref 20–380)

## 2016-04-13 ENCOUNTER — Encounter: Payer: Self-pay | Admitting: Family Medicine

## 2016-04-13 ENCOUNTER — Other Ambulatory Visit (HOSPITAL_COMMUNITY)
Admission: RE | Admit: 2016-04-13 | Discharge: 2016-04-13 | Disposition: A | Discharge status: Home / Self Care | Payer: BLUE CROSS/BLUE SHIELD | Source: Other Acute Inpatient Hospital | Attending: Family Medicine | Admitting: Family Medicine

## 2016-04-13 ENCOUNTER — Ambulatory Visit (INDEPENDENT_AMBULATORY_CARE_PROVIDER_SITE_OTHER): Payer: BLUE CROSS/BLUE SHIELD | Admitting: Family Medicine

## 2016-04-13 VITALS — BP 118/74 | HR 67 | Resp 16 | Ht 69.0 in | Wt 223.0 lb

## 2016-04-13 DIAGNOSIS — Z125 Encounter for screening for malignant neoplasm of prostate: Secondary | ICD-10-CM | POA: Diagnosis not present

## 2016-04-13 DIAGNOSIS — E1122 Type 2 diabetes mellitus with diabetic chronic kidney disease: Secondary | ICD-10-CM

## 2016-04-13 DIAGNOSIS — E669 Obesity, unspecified: Secondary | ICD-10-CM

## 2016-04-13 DIAGNOSIS — Z794 Long term (current) use of insulin: Secondary | ICD-10-CM | POA: Diagnosis present

## 2016-04-13 DIAGNOSIS — J302 Other seasonal allergic rhinitis: Secondary | ICD-10-CM

## 2016-04-13 DIAGNOSIS — E119 Type 2 diabetes mellitus without complications: Secondary | ICD-10-CM | POA: Insufficient documentation

## 2016-04-13 DIAGNOSIS — E785 Hyperlipidemia, unspecified: Secondary | ICD-10-CM | POA: Diagnosis not present

## 2016-04-13 DIAGNOSIS — I129 Hypertensive chronic kidney disease with stage 1 through stage 4 chronic kidney disease, or unspecified chronic kidney disease: Secondary | ICD-10-CM

## 2016-04-13 DIAGNOSIS — N182 Chronic kidney disease, stage 2 (mild): Secondary | ICD-10-CM

## 2016-04-13 DIAGNOSIS — IMO0001 Reserved for inherently not codable concepts without codable children: Secondary | ICD-10-CM

## 2016-04-13 MED ORDER — GLIPIZIDE 10 MG PO TABS: ORAL_TABLET | ORAL | 1 refills | Status: DC

## 2016-04-13 MED ORDER — OLMESARTAN MEDOXOMIL 20 MG PO TABS: ORAL_TABLET | ORAL | 1 refills | Status: DC

## 2016-04-13 MED ORDER — ATORVASTATIN CALCIUM 20 MG PO TABS: 20.0000 mg | ORAL_TABLET | Freq: Every day | ORAL | 3 refills | Status: DC

## 2016-04-13 NOTE — Patient Instructions (Signed)
F/u in 4 month,May 14 or after call if you need me before  CONGRATS!!  It is important that you exercise regularly at least 30 minutes 7 times a week. If you develop chest pain, have severe difficulty breathing, or feel very tired, stop exercising immediately and seek medical attention     Reduce lipitor dose to 20 mg . We will also contact pharmacy  Reduce insulin to 70 units, may even need to lower to 65 units  MUST continue to check and record blood sugars and f/u with educaator  Take low dose iron 65 mg one 4 times weekly if able take daily, 325 mg daily is even better     Foot exam good  Fasting lipid, cmp and eGFr, hBA1C, PSA May 10 or after

## 2016-04-14 LAB — MICROALBUMIN / CREATININE URINE RATIO
Creatinine, Urine: 207.7 mg/dL
MICROALB UR: 16.3 ug/mL — AB
MICROALB/CREAT RATIO: 7.8 mg/g{creat} (ref 0.0–30.0)

## 2016-04-14 NOTE — Progress Notes (Signed)
Robert [name]     MRN: [id number]      DOB: Apr 28, 1950   HPI Robert [name] is here for follow up and re-evaluation of chronic medical conditions, medication management and review of any available recent lab and radiology data.  Preventive health is updated, specifically  Cancer screening and Immunization.   Questions or concerns regarding consultations or procedures which the PT has had in the interim are  Addressed.Did see diabetic educator and has benefited greatly Still no regular exercise The PT denies any adverse reactions to current medications since the last visit.  There are no new concerns.  C/o generalized joint pains and remains demotivated as far as physical activity is concerned but is willing to work on this Has had a few low blood sugar episodes in the 60's, states this occurs when his eating is inconsitent  ROS Denies recent fever or chills. Denies sinus pressure, nasal congestion, ear pain or sore throat. Denies chest congestion, productive cough or wheezing. Denies chest pains, palpitations and leg swelling Denies abdominal pain, nausea, vomiting,diarrhea or constipation.   Denies dysuria, frequency, hesitancy or incontinence.  Denies headaches, seizures, numbness, or tingling. Denies depression, anxiety or insomnia. Denies skin break down or rash.   PE  BP 118/74   Pulse 67   Resp 16   Ht 5\' 9"  (1.753 m)   Wt 223 lb (101.2 kg)   SpO2 96%   BMI 32.93 kg/m   Patient alert and oriented and in no cardiopulmonary distress.  HEENT: No facial asymmetry, EOMI,   oropharynx pink and moist.  Neck supple no JVD, no mass.  Chest: Clear to auscultation bilaterally.  CVS: S1, S2 no murmurs, no S3.Regular rate.  ABD: Soft non tender.   Ext: No edema  MS: Adequate ROM spine, shoulders, hips and knees.  Skin: Intact, no ulcerations or rash noted.  Psych: Good eye contact, normal affect. Memory intact not anxious or depressed appearing.  CNS: CN 2-12 intact,  power,  normal throughout.no focal deficits noted.   Assessment & Plan  Hypertension associated with stage 2 chronic kidney disease due to type 2 diabetes mellitus Controlled, no change in medication DASH diet and commitment to daily physical activity for a minimum of 30 minutes discussed and encouraged, as a part of hypertension management. The importance of attaining a healthy weight is also discussed.  BP/Weight 04/13/2016 03/15/2016 02/01/2016 01/12/2016 01/04/2016 10/20/2015 123456  Systolic BP 123456 - - 123456 123456 - [id number]  Diastolic BP 74 - - 74 70 - 70  Wt. (Lbs) 223 224 230 231 230 226 226  BMI 32.93 33.08 33.97 34.11 33.97 33.36 33.36       Diabetes mellitus, insulin dependent (IDDM), controlled Markedly improved , now overcorrected with improved nutrition Lower insulin dose and continue with educator Robert [name] is reminded of the importance of commitment to daily physical activity for 30 minutes or more, as able and the need to limit carbohydrate intake to 30 to 60 grams per meal to help with blood sugar control.   The need to take medication as prescribed, test blood sugar as directed, and to call between visits if there is a concern that blood sugar is uncontrolled is also discussed.   Robert [name] is reminded of the importance of daily foot exam, annual eye examination, and good blood sugar, blood pressure and cholesterol control.  Diabetic Labs Latest Ref Rng & Units 04/11/2016 01/11/2016 08/09/2015 04/09/2015 12/31/2014  HbA1c <5.7 % 6.9(H) 8.0(H) 7.5(H) 7.4(H) 7.7(H)  Microalbumin <2.0 mg/dL - - - - 2.5(H)  Micro/Creat Ratio 0.0 - 30.0 mg/g - - - - 10.2  Chol <200 mg/dL 93 137 110(L) 138 112(L)  HDL >40 mg/dL 31(L) 35(L) 37(L) 37(L) 30(L)  Calc LDL <100 mg/dL 43 57 47 62 49  Triglycerides <150 mg/dL 96 227(H) 131 194(H) 164(H)  Creatinine 0.70 - 1.25 mg/dL 1.33(H) 1.23 1.14 1.34(H) 1.16   BP/Weight 04/13/2016 03/15/2016 02/01/2016 01/12/2016 01/04/2016 10/20/2015 123456    Systolic BP 123456 - - 123456 123456 - [id number]  Diastolic BP 74 - - 74 70 - 70  Wt. (Lbs) 223 224 230 231 230 226 226  BMI 32.93 33.08 33.97 34.11 33.97 33.36 33.36   Foot/eye exam completion dates Latest Ref Rng & Units 04/13/2016 04/13/2015  Eye Exam No Retinopathy - -  Foot exam Order - - -  Foot Form Completion - Done Done        Hyperlipidemia LDL goal <70 Hyperlipidemia:Low fat diet discussed and encouraged.   Lipid Panel  Lab Results  Component Value Date   CHOL 93 04/11/2016   HDL 31 (L) 04/11/2016   LDLCALC 43 04/11/2016   TRIG 96 04/11/2016   CHOLHDL 3.0 04/11/2016   , reduce lipitor dose, needs to commit to regular exerice    Obesity (BMI 30.0-34.9) Unchanged Patient re-educated about  the importance of commitment to a  minimum of 150 minutes of exercise per week.  The importance of healthy food choices with portion control discussed. Encouraged to start a food diary, count calories and to consider  joining a support group. Sample diet sheets offered. Goals set by the patient for the next several months.   Weight /BMI 04/13/2016 03/15/2016 02/01/2016  WEIGHT 223 lb 224 lb 230 lb  HEIGHT 5\' 9"  5\' 9"  5\' 9"   BMI 32.93 kg/m2 33.08 kg/m2 33.97 kg/m2      Seasonal allergies Controlled, no change in medication

## 2016-04-14 NOTE — Assessment & Plan Note (Signed)
Controlled, no change in medication DASH diet and commitment to daily physical activity for a minimum of 30 minutes discussed and encouraged, as a part of hypertension management. The importance of attaining a healthy weight is also discussed.  BP/Weight 04/13/2016 03/15/2016 02/01/2016 01/12/2016 01/04/2016 10/20/2015 123456  Systolic BP 123456 - - 123456 123456 - [id number]  Diastolic BP 74 - - 74 70 - 70  Wt. (Lbs) 223 224 230 231 230 226 226  BMI 32.93 33.08 33.97 34.11 33.97 33.36 33.36

## 2016-04-14 NOTE — Assessment & Plan Note (Addendum)
Hyperlipidemia:Low fat diet discussed and encouraged.   Lipid Panel  Lab Results  Component Value Date   CHOL 93 04/11/2016   HDL 31 (L) 04/11/2016   LDLCALC 43 04/11/2016   TRIG 96 04/11/2016   CHOLHDL 3.0 04/11/2016   , reduce lipitor dose, needs to commit to regular exerice

## 2016-04-14 NOTE — Assessment & Plan Note (Signed)
Markedly improved , now overcorrected with improved nutrition Lower insulin dose and continue with educator Robert [name] is reminded of the importance of commitment to daily physical activity for 30 minutes or more, as able and the need to limit carbohydrate intake to 30 to 60 grams per meal to help with blood sugar control.   The need to take medication as prescribed, test blood sugar as directed, and to call between visits if there is a concern that blood sugar is uncontrolled is also discussed.   Robert [name] is reminded of the importance of daily foot exam, annual eye examination, and good blood sugar, blood pressure and cholesterol control.  Diabetic Labs Latest Ref Rng & Units 04/11/2016 01/11/2016 08/09/2015 04/09/2015 12/31/2014  HbA1c <5.7 % 6.9(H) 8.0(H) 7.5(H) 7.4(H) 7.7(H)  Microalbumin <2.0 mg/dL - - - - 2.5(H)  Micro/Creat Ratio 0.0 - 30.0 mg/g - - - - 10.2  Chol <200 mg/dL 93 137 110(L) 138 112(L)  HDL >40 mg/dL 31(L) 35(L) 37(L) 37(L) 30(L)  Calc LDL <100 mg/dL 43 57 47 62 49  Triglycerides <150 mg/dL 96 227(H) 131 194(H) 164(H)  Creatinine 0.70 - 1.25 mg/dL 1.33(H) 1.23 1.14 1.34(H) 1.16   BP/Weight 04/13/2016 03/15/2016 02/01/2016 01/12/2016 01/04/2016 10/20/2015 123456  Systolic BP 123456 - - 123456 123456 - [id number]  Diastolic BP 74 - - 74 70 - 70  Wt. (Lbs) 223 224 230 231 230 226 226  BMI 32.93 33.08 33.97 34.11 33.97 33.36 33.36   Foot/eye exam completion dates Latest Ref Rng & Units 04/13/2016 04/13/2015  Eye Exam No Retinopathy - -  Foot exam Order - - -  Foot Form Completion - Done Done

## 2016-04-14 NOTE — Assessment & Plan Note (Signed)
Unchanged Patient re-educated about  the importance of commitment to a  minimum of 150 minutes of exercise per week.  The importance of healthy food choices with portion control discussed. Encouraged to start a food diary, count calories and to consider  joining a support group. Sample diet sheets offered. Goals set by the patient for the next several months.   Weight /BMI 04/13/2016 03/15/2016 02/01/2016  WEIGHT 223 lb 224 lb 230 lb  HEIGHT 5\' 9"  5\' 9"  5\' 9"   BMI 32.93 kg/m2 33.08 kg/m2 33.97 kg/m2

## 2016-04-14 NOTE — Assessment & Plan Note (Signed)
Controlled, no change in medication  

## 2016-05-03 ENCOUNTER — Other Ambulatory Visit: Payer: Self-pay

## 2016-05-03 MED ORDER — GLUCOSE BLOOD VI STRP: 1.0000 | ORAL_STRIP | Freq: Two times a day (BID) | 3 refills | Status: DC

## 2016-06-06 ENCOUNTER — Other Ambulatory Visit: Payer: Self-pay

## 2016-06-06 MED ORDER — METOPROLOL SUCCINATE ER 50 MG PO TB24: ORAL_TABLET | ORAL | 3 refills | Status: DC

## 2016-06-20 ENCOUNTER — Encounter: Payer: BLUE CROSS/BLUE SHIELD | Attending: Family Medicine | Admitting: Nutrition

## 2016-06-20 VITALS — Wt 226.0 lb

## 2016-06-20 DIAGNOSIS — E119 Type 2 diabetes mellitus without complications: Secondary | ICD-10-CM | POA: Insufficient documentation

## 2016-06-20 DIAGNOSIS — Z794 Long term (current) use of insulin: Secondary | ICD-10-CM | POA: Insufficient documentation

## 2016-06-20 NOTE — Patient Instructions (Signed)
Goals 1. Exercise Walking 30 minutes 5 days per week. 2. Cut late night snacks and snacks between meals 3. Test blood sugar at bedtime 2-3 times a week  Keep up the great job!!

## 2016-06-20 NOTE — Progress Notes (Signed)
  Medical Nutrition Therapy:  Appt start time: 1330 end time:  1430.  Assessment:  Primary concerns today: Diabetes Type 2  A1c down from 8% to 6.9%. Cut out green tea, sweets, watched portions and eating meal on time and avoiding snacks. Gained 3 lbs. Admits he is not exercising like he knows she should. . Plays some golf at times. Levermir 75 units recently reduced from 80 units from Dr. Moshe Cipro. Reduced Glipizide to once a day. Now on the Cpap machine since August 2018. Feels better. Admits to having some hypoglycemia at times that makes him eat between meals and then gets hungry later in evenings and wants to snack after supper.    Making great progress. Just needs to increase exercise consistently.   Lab Results  Component Value Date   HGBA1C 6.9 (H) 04/11/2016      Preferred Learning Style: Auditory  No preference indicated   Learning Readiness:  Ready  Change in progress   MEDICATIONS: See List   DIETARY INTAKE:    Eating 3 meals per day. Cut out sweets, soda and diet beverages. Eating more fresh fruits and vegetables. Drinking water No snacks unless bs low but tends to snack some after supper late at night.   Usual physical activity: None  Estimated energy needs: 1800-2000 calories 200 g carbohydrates 135 g protein 50 g fat  Progress Towards Goal(s):  In progress.   Nutritional Diagnosis:  NB-1.1 Food and nutrition-related knowledge deficit As related to Diabetes Type 2.  As evidenced by A1C 8%.    Intervention:  Nutrition and Diabetes education provided on My Plate, CHO counting, meal planning, portion sizes, timing of meals, avoiding snacks between meals unless having a low blood sugar, target ranges for A1C and blood sugars, signs/symptoms and treatment of hyper/hypoglycemia, monitoring blood sugars, taking medications as prescribed, benefits of exercising 30 minutes per day and prevention of complications of DM.  Meal planning   Goals 1. Exercise  Walking 30 minutes 5 days per week. 2. Cut late night snacks and snacks between meals 3. Test blood sugar at bedtime 2-3 times a week  Keep up the great job!!   Teaching Method Utilized:  Visual Auditory Hands on  Handouts given during visit include:  Plate Method  Meal Plan Card  Diabetes Instructions.   Barriers to learning/adherence to lifestyle change: none  Demonstrated degree of understanding via:  Teach Back   Monitoring/Evaluation:  Dietary intake, exercise, meal planning, SBG, and body weight in 6 month(s).

## 2016-07-10 ENCOUNTER — Ambulatory Visit (INDEPENDENT_AMBULATORY_CARE_PROVIDER_SITE_OTHER): Payer: BLUE CROSS/BLUE SHIELD | Admitting: Cardiology

## 2016-07-10 ENCOUNTER — Encounter: Payer: Self-pay | Admitting: Cardiology

## 2016-07-10 VITALS — BP 114/70 | HR 69 | Ht 69.0 in | Wt 221.0 lb

## 2016-07-10 DIAGNOSIS — I10 Essential (primary) hypertension: Secondary | ICD-10-CM | POA: Diagnosis not present

## 2016-07-10 DIAGNOSIS — I2510 Atherosclerotic heart disease of native coronary artery without angina pectoris: Secondary | ICD-10-CM

## 2016-07-10 DIAGNOSIS — E782 Mixed hyperlipidemia: Secondary | ICD-10-CM | POA: Diagnosis not present

## 2016-07-10 NOTE — Patient Instructions (Signed)
Your physician wants you to follow-up in: 6 Months with Dr. Branch. You will receive a reminder letter in the mail two months in advance. If you don't receive a letter, please call our office to schedule the follow-up appointment.  Your physician recommends that you continue on your current medications as directed. Please refer to the Current Medication list given to you today.  If you need a refill on your cardiac medications before your next appointment, please call your pharmacy.  Thank you for choosing [facility]!   

## 2016-07-10 NOTE — Progress Notes (Signed)
Clinical Summary Robert [name] is a 66 y.o.male seen today for follow up of the following medical problems.   1. CAD - history of prior stenting as described below -08/2014 exercise nuclear stress with Duke treadmill score of 7 consistent with low risk, small area of inferolateral ischemia. Mild inferior ischemia has been noted previously in 12/2009 stress, from notes he has history of RCA CTO that was not able to be opened during 2003 cath in [place].  - occasional left sided chest pain. Dull pain, 2-3/10. No other associated symptoms. Not positional. Lasts just a few seconds. Sporadic, can occur at rest or with exertion. Ongoing for several years.    - still with some mild pains at times. Overall symptoms are stable.  - no SOB or DOE - compliant with meds  2. HTN - compliant meds  3. Hyperlipidemia - compliant with statin - last lipid panel Jan 2018 TC 93 TG 96 HDL 31 LDL 43  4. DM II - followed by Robert [name] -Jan 2018 HgbA1c 6.9  5. OSA -  compliant with cpap    Past Medical History:  Diagnosis Date  . Arteriosclerotic cardiovascular disease (ASCVD)    DES to LAD D1 - 2003 in [place]; failed intervention for a totally obstructed RCA at that time; EF of 45%; 12/2009: Equivocal stress nuclear with good exercise tolerance, negative stress EKG, normal EF with mild mid and distal inferior ischemia  . Coronary artery disease   . Diabetes mellitus, type II ([place])   . Hyperlipidemia    Lipid profile in 09/2011:128, 130, 33, 69  . Hypertension    Lab  09/2011: Normal CMet ex G-[id number]  . Tobacco abuse, in remission    30 pack years; quit in 1985     Allergies  Allergen Reactions  . Rosuvastatin Other (See Comments)    Adverse GI symptoms     Current Outpatient Prescriptions  Medication Sig Dispense Refill  . acetaminophen (TYLENOL) 500 MG tablet Take 1,000 mg by mouth every 6 (six) hours as needed for mild pain.    Marland Kitchen aspirin 81 MG tablet Take 81 mg by mouth daily.       Marland Kitchen atorvastatin (LIPITOR) 20 MG tablet Take 1 tablet (20 mg total) by mouth daily. 90 tablet 3  . betamethasone valerate (VALISONE) 0.1 % cream Apply 1 application topically daily. 30 g 0  . glipiZIDE (GLUCOTROL) 10 MG tablet TAKE 1 TABLET TWICE A DAY BEFORE MEALS 180 tablet 1  . glucose blood (ONE TOUCH ULTRA TEST) test strip 1 each by Other route 2 (two) times daily. Use as instructed twice daily dx e11.9 200 each 3  . Insulin Detemir (LEVEMIR) 100 UNIT/ML Pen 80 units once daily 45 mL 5  . Insulin Pen Needle (B-D ULTRAFINE III SHORT PEN) 31G X 8 MM MISC USE AS DIRECTED WITH LEVEMIR FLEXPEN daily 100 each 11  . LEVEMIR FLEXTOUCH 100 UNIT/ML Pen INJECT 80 UNITS ONCE DAILY (DOSE INCREASE EFFECTIVE   01/04/2015) 75 mL 3  . loratadine (CLARITIN) 10 MG tablet Take 1 tablet (10 mg total) by mouth daily. 30 tablet 11  . metoprolol succinate (TOPROL-XL) 50 MG 24 hr tablet TAKE 1 TABLET DAILY WITH OR IMMEDIATELY AFTER A MEAL 90 tablet 3  . olmesartan (BENICAR) 20 MG tablet TAKE 1 TABLET DAILY (DOSE REDUCTION) 90 tablet 1  . tadalafil (CIALIS) 5 MG tablet TAKE 1 TABLET (5 MG TOTAL) BY MOUTH DAILY AS NEEDED. 30 tablet 1   No current  facility-administered medications for this visit.      Past Surgical History:  Procedure Laterality Date  . CARDIAC CATHETERIZATION    . COLONOSCOPY  2010   Negative screening study  . COLONOSCOPY N/A 11/11/2014   Procedure: COLONOSCOPY;  Surgeon: Robert Houston, MD;  Location: [facility];  Service: Endoscopy;  Laterality: N/A;  1030  . EPIDIDYMIS SURGERY  1996  . KNEE ARTHROSCOPY W/ MENISCECTOMY  11/2008   Right  . PRESSURE ULCER DEBRIDEMENT  2006   Right lower extremity  . ROTATOR CUFF REPAIR  07/2009   Right     Allergies  Allergen Reactions  . Rosuvastatin Other (See Comments)    Adverse GI symptoms      Family History  Problem Relation Age of Onset  . Diabetes Sister   . Arthritis Brother   . Diabetes Brother   . Cancer Father      Hypernephroma     Social History Robert [name] reports that he quit smoking about 32 years ago. His smoking use included Cigarettes. He started smoking about 50 years ago. He has a 30.00 pack-year smoking history. He has never used smokeless tobacco. Robert [name] reports that he does not drink alcohol.   Review of Systems CONSTITUTIONAL: No weight loss, fever, chills, weakness or fatigue.  HEENT: Eyes: No visual loss, blurred vision, double vision or yellow sclerae.No hearing loss, sneezing, congestion, runny nose or sore throat.  SKIN: No rash or itching.  CARDIOVASCULAR: per HPI RESPIRATORY: No shortness of breath, cough or sputum.  GASTROINTESTINAL: No anorexia, nausea, vomiting or diarrhea. No abdominal pain or blood.  GENITOURINARY: No burning on urination, no polyuria NEUROLOGICAL: No headache, dizziness, syncope, paralysis, ataxia, numbness or tingling in the extremities. No change in bowel or bladder control.  MUSCULOSKELETAL: No muscle, back pain, joint pain or stiffness.  LYMPHATICS: No enlarged nodes. No history of splenectomy.  PSYCHIATRIC: No history of depression or anxiety.  ENDOCRINOLOGIC: No reports of sweating, cold or heat intolerance. No polyuria or polydipsia.  Marland Kitchen   Physical Examination Vitals:   07/10/16 0826  BP: 114/70  Pulse: 69   Vitals:   07/10/16 0826  Weight: 221 lb (100.2 kg)  Height: 5\' 9"  (1.753 m)    Gen: resting comfortably, no acute distress HEENT: no scleral icterus, pupils equal round and reactive, no palptable cervical adenopathy,  CV: RRR, no m/r/g, no jvd Resp: Clear to auscultation bilaterally GI: abdomen is soft, non-tender, non-distended, normal bowel sounds, no hepatosplenomegaly MSK: extremities are warm, no edema.  Skin: warm, no rash Neuro:  no focal deficits Psych: appropriate affect   Diagnostic Studies 08/2014 Exercise Nuclear stress  Low risk Duke treadmill score of 7.  Blood pressure demonstrated a hypertensive response  to exercise.  There was no ST segment deviation noted during stress.  There is a small defect of mild severity present in the basal inferolateral, mid inferolateral and apical inferior location. The defect is reversible. Consistent with mild, predominantly mid to basal inferolateral ischemia.  Myocardial perfusion is abnormal. This is a low risk study.    Assessment and Plan  1. CAD - chronic atypical chest pain symptoms that are unchanged.  - he will continue current meds  2. HTN - bp is at goal, continue current meds  3. Hyperlipidemia - continue current statin, lipids are at goal.       Arnoldo Lenis, M.D.,

## 2016-08-11 LAB — COMPLETE METABOLIC PANEL WITH GFR
ALBUMIN: 4 g/dL (ref 3.6–5.1)
ALK PHOS: 112 U/L (ref 40–115)
ALT: 18 U/L (ref 9–46)
AST: 19 U/L (ref 10–35)
BUN: 22 mg/dL (ref 7–25)
CALCIUM: 8.7 mg/dL (ref 8.6–10.3)
CHLORIDE: 108 mmol/L (ref 98–110)
CO2: 23 mmol/L (ref 20–31)
Creat: 1.4 mg/dL — ABNORMAL HIGH (ref 0.70–1.25)
GFR, EST NON AFRICAN AMERICAN: 53 mL/min — AB (ref >=60)
GFR, Est African American: 61 mL/min (ref >=60)
Glucose, Bld: 122 mg/dL — ABNORMAL HIGH (ref 65–99)
POTASSIUM: 4.5 mmol/L (ref 3.5–5.3)
SODIUM: 140 mmol/L (ref 135–146)
Total Bilirubin: 0.5 mg/dL (ref 0.2–1.2)
Total Protein: 6.9 g/dL (ref 6.1–8.1)

## 2016-08-11 LAB — LIPID PANEL
CHOL/HDL RATIO: 3.4 ratio (ref <5.0)
Cholesterol: 128 mg/dL (ref <200)
HDL: 38 mg/dL — AB (ref >40)
LDL CALC: 73 mg/dL (ref <100)
Triglycerides: 84 mg/dL (ref <150)
VLDL: 17 mg/dL (ref <30)

## 2016-08-12 LAB — PSA: PSA: 1.4 ng/mL (ref <=4.0)

## 2016-08-12 LAB — HEMOGLOBIN A1C
HEMOGLOBIN A1C: 7.6 % — AB (ref <5.7)
Mean Plasma Glucose: 171 mg/dL

## 2016-08-15 ENCOUNTER — Ambulatory Visit (INDEPENDENT_AMBULATORY_CARE_PROVIDER_SITE_OTHER): Payer: BLUE CROSS/BLUE SHIELD | Admitting: Family Medicine

## 2016-08-15 ENCOUNTER — Encounter: Payer: Self-pay | Admitting: Family Medicine

## 2016-08-15 VITALS — BP 136/74 | HR 86 | Resp 16 | Ht 69.0 in | Wt 225.0 lb

## 2016-08-15 DIAGNOSIS — I129 Hypertensive chronic kidney disease with stage 1 through stage 4 chronic kidney disease, or unspecified chronic kidney disease: Secondary | ICD-10-CM

## 2016-08-15 DIAGNOSIS — N182 Chronic kidney disease, stage 2 (mild): Secondary | ICD-10-CM | POA: Diagnosis not present

## 2016-08-15 DIAGNOSIS — Z0000 Encounter for general adult medical examination without abnormal findings: Secondary | ICD-10-CM | POA: Diagnosis not present

## 2016-08-15 DIAGNOSIS — IMO0001 Reserved for inherently not codable concepts without codable children: Secondary | ICD-10-CM

## 2016-08-15 DIAGNOSIS — E1122 Type 2 diabetes mellitus with diabetic chronic kidney disease: Secondary | ICD-10-CM | POA: Diagnosis not present

## 2016-08-15 DIAGNOSIS — E119 Type 2 diabetes mellitus without complications: Secondary | ICD-10-CM

## 2016-08-15 DIAGNOSIS — Z1211 Encounter for screening for malignant neoplasm of colon: Secondary | ICD-10-CM | POA: Diagnosis not present

## 2016-08-15 DIAGNOSIS — Z794 Long term (current) use of insulin: Secondary | ICD-10-CM

## 2016-08-15 LAB — POC HEMOCCULT BLD/STL (OFFICE/1-CARD/DIAGNOSTIC): FECAL OCCULT BLD: NEGATIVE

## 2016-08-15 MED ORDER — TADALAFIL 5 MG PO TABS: ORAL_TABLET | ORAL | 1 refills | Status: DC

## 2016-08-15 MED ORDER — OLMESARTAN MEDOXOMIL 20 MG PO TABS: ORAL_TABLET | ORAL | 1 refills | Status: DC

## 2016-08-15 NOTE — Patient Instructions (Addendum)
Welcome to medicare in 3.5 month, call if you need me sooner  HBA1C, chem 7 and EGFr, iron level no fast 1 week before visit  Please work on good  health habits so that your health will improve. 1. Commitment to daily physical activity for 30 to 60  minutes, if you are able to do this.  2. Commitment to wise food choices. Aim for half of your  food intake to be vegetable and fruit, one quarter starchy foods, and one quarter protein. Try to eat on a regular schedule  3 meals per day, snacking between meals should be limited to vegetables or fruits or small portions of nuts. 64 ounces of water per day is generally recommended, unless you have specific health conditions, like heart failure or kidney failure where you will need to limit fluid intake.  3. Commitment to sufficient and a  good quality of physical and mental rest daily, generally between 6 to 8 hours per day.  WITH PERSISTANCE AND PERSEVERANCE, THE IMPOSSIBLE , BECOMES THE NORM! PLEASE eat on a rEGULAR schedule   It is important that you exercise regularly at least 30 minutes 5 times a week. If you develop chest pain, have severe difficulty breathing, or feel very tired, stop exercising immediately and seek medical attention    Thank you  for choosing [facility]. We consider it a privelige to serve you.  Delivering excellent health care in a caring and  compassionate way is our goal.  Partnering with you,  so that together we can achieve this goal is our strategy.

## 2016-08-15 NOTE — Progress Notes (Signed)
Robert [name]     MRN: [id number]      DOB: 17-Sep-1950   HPI: Patient is in for annual physical exam. C/o enlarging mole on left upper abdomen, worried about malignant change Recent labs, are reviewed. Immunization is reviewed , and  updated if needed.    PE; BP 136/74   Pulse 86   Resp 16   Ht 5\' 9"  (1.753 m)   Wt 225 lb (102.1 kg)   SpO2 94%   BMI 33.23 kg/m   Pleasant male, alert and oriented x 3, in no cardio-pulmonary distress. Afebrile. HEENT No facial trauma or asymetry. Sinuses non tender. EOMI,. External ears normal, tympanic membranes clear. Oropharynx moist, no exudate. Neck: supple, no adenopathy,JVD or thyromegaly.No bruits.  Chest: Clear to ascultation bilaterally.No crackles or wheezes. Non tender to palpation  Breast: No asymetry,no masses. No nipple discharge or inversion. No axillary or supraclavicular adenopathy  Cardiovascular system; Heart sounds normal,  S1 and  S2 ,no S3.  No murmur, or thrill. Apical beat not displaced Peripheral pulses normal.  Abdomen: Soft, non tender, no organomegaly or masses. No bruits. Bowel sounds normal. No guarding, tenderness or rebound.  Rectal:  Normal sphincter tone. No hemorrhoids or  masses. guaiac negative stool. Prostate smooth and firm    Musculoskeletal exam: Full ROM of spine, hips , shoulders and knees. No deformity ,swelling or crepitus noted. No muscle wasting or atrophy.   Neurologic: Cranial nerves 2 to 12 intact. Power, tone ,sensation and reflexes normal throughout. No disturbance in gait. No tremor.  Skin: Intact, no ulceration, erythema , scaling or rash noted. Hemangioma LUQ, lower left chest Pigmentation normal throughout  Psych; Normal mood and affect. Judgement and concentration normal   Assessment & Plan:  Annual physical exam Annual exam as documented. Counseling done  re healthy lifestyle involving commitment to 150 minutes exercise per week, heart healthy  diet, and attaining healthy weight.The importance of adequate sleep also discussed.  Immunization and cancer screening needs are specifically addressed at this visit.   Diabetes mellitus, insulin dependent (IDDM), controlled Deteriorated, needs to eat on regular schedule as he experiences blood sugar lows Mr. Schmelzle is reminded of the importance of commitment to daily physical activity for 30 minutes or more, as able and the need to limit carbohydrate intake to 30 to 60 grams per meal to help with blood sugar control.   The need to take medication as prescribed, test blood sugar as directed, and to call between visits if there is a concern that blood sugar is uncontrolled is also discussed.   Mr. Centrella is reminded of the importance of daily foot exam, annual eye examination, and good blood sugar, blood pressure and cholesterol control.  Diabetic Labs Latest Ref Rng & Units 08/11/2016 04/13/2016 04/11/2016 01/11/2016 08/09/2015  HbA1c <5.7 % 7.6(H) - 6.9(H) 8.0(H) 7.5(H)  Microalbumin Not Estab. ug/mL - 16.3(H) - - -  Micro/Creat Ratio 0.0 - 30.0 mg/g creat - 7.8 - - -  Chol <200 mg/dL 128 - 93 137 110(L)  HDL >40 mg/dL 38(L) - 31(L) 35(L) 37(L)  Calc LDL <100 mg/dL 73 - 43 57 47  Triglycerides <150 mg/dL 84 - 96 227(H) 131  Creatinine 0.70 - 1.25 mg/dL 1.40(H) - 1.33(H) 1.23 1.14   BP/Weight 08/15/2016 07/10/2016 06/20/2016 04/13/2016 03/15/2016 02/01/2016 07/37/1062  Systolic BP 694 854 - 627 - - 035  Diastolic BP 74 70 - 74 - - 74  Wt. (Lbs) 225 221 226 223 224 230 231  BMI 33.23 32.64 33.37 32.93 33.08 33.97 34.11   Foot/eye exam completion dates Latest Ref Rng & Units 08/15/2016 04/13/2016  Eye Exam No Retinopathy - -  Foot exam Order - - -  Foot Form Completion - Done Done        Colon cancer screening No rectal mass and heme negative stool

## 2016-08-15 NOTE — Assessment & Plan Note (Addendum)
Annual exam as documented. Counseling done  re healthy lifestyle involving commitment to 150 minutes exercise per week, heart healthy diet, and attaining healthy weight.The importance of adequate sleep also discussed.  Immunization and cancer screening needs are specifically addressed at this visit.  

## 2016-08-15 NOTE — Assessment & Plan Note (Signed)
Deteriorated, needs to eat on regular schedule as he experiences blood sugar lows Mr. Navarrette is reminded of the importance of commitment to daily physical activity for 30 minutes or more, as able and the need to limit carbohydrate intake to 30 to 60 grams per meal to help with blood sugar control.   The need to take medication as prescribed, test blood sugar as directed, and to call between visits if there is a concern that blood sugar is uncontrolled is also discussed.   Mr. Candela is reminded of the importance of daily foot exam, annual eye examination, and good blood sugar, blood pressure and cholesterol control.  Diabetic Labs Latest Ref Rng & Units 08/11/2016 04/13/2016 04/11/2016 01/11/2016 08/09/2015  HbA1c <5.7 % 7.6(H) - 6.9(H) 8.0(H) 7.5(H)  Microalbumin Not Estab. ug/mL - 16.3(H) - - -  Micro/Creat Ratio 0.0 - 30.0 mg/g creat - 7.8 - - -  Chol <200 mg/dL 128 - 93 137 110(L)  HDL >40 mg/dL 38(L) - 31(L) 35(L) 37(L)  Calc LDL <100 mg/dL 73 - 43 57 47  Triglycerides <150 mg/dL 84 - 96 227(H) 131  Creatinine 0.70 - 1.25 mg/dL 1.40(H) - 1.33(H) 1.23 1.14   BP/Weight 08/15/2016 07/10/2016 06/20/2016 04/13/2016 03/15/2016 02/01/2016 34/28/7681  Systolic BP 157 262 - 035 - - 597  Diastolic BP 74 70 - 74 - - 74  Wt. (Lbs) 225 221 226 223 224 230 231  BMI 33.23 32.64 33.37 32.93 33.08 33.97 34.11   Foot/eye exam completion dates Latest Ref Rng & Units 08/15/2016 04/13/2016  Eye Exam No Retinopathy - -  Foot exam Order - - -  Foot Form Completion - Done Done

## 2016-08-15 NOTE — Assessment & Plan Note (Signed)
No rectal mass and heme negative stool 

## 2016-09-29 ENCOUNTER — Other Ambulatory Visit: Payer: Self-pay | Admitting: Family Medicine

## 2016-09-29 NOTE — Telephone Encounter (Signed)
Seen 5 15 18 

## 2016-11-28 ENCOUNTER — Other Ambulatory Visit: Payer: Self-pay

## 2016-11-28 DIAGNOSIS — E119 Type 2 diabetes mellitus without complications: Secondary | ICD-10-CM | POA: Diagnosis not present

## 2016-11-28 DIAGNOSIS — E1122 Type 2 diabetes mellitus with diabetic chronic kidney disease: Secondary | ICD-10-CM | POA: Diagnosis not present

## 2016-11-28 DIAGNOSIS — Z794 Long term (current) use of insulin: Secondary | ICD-10-CM | POA: Diagnosis not present

## 2016-11-28 DIAGNOSIS — I129 Hypertensive chronic kidney disease with stage 1 through stage 4 chronic kidney disease, or unspecified chronic kidney disease: Secondary | ICD-10-CM | POA: Diagnosis not present

## 2016-11-28 DIAGNOSIS — N182 Chronic kidney disease, stage 2 (mild): Secondary | ICD-10-CM | POA: Diagnosis not present

## 2016-11-28 LAB — BASIC METABOLIC PANEL WITH GFR
BUN: 15 mg/dL (ref 7–25)
CHLORIDE: 107 mmol/L (ref 98–110)
CO2: 24 mmol/L (ref 20–32)
Calcium: 8.5 mg/dL — ABNORMAL LOW (ref 8.6–10.3)
Creat: 1.3 mg/dL — ABNORMAL HIGH (ref 0.70–1.25)
GFR, EST NON AFRICAN AMERICAN: 57 mL/min — AB (ref >=60)
GFR, Est African American: 66 mL/min (ref >=60)
Glucose, Bld: 103 mg/dL — ABNORMAL HIGH (ref 65–99)
Potassium: 4.5 mmol/L (ref 3.5–5.3)
SODIUM: 138 mmol/L (ref 135–146)

## 2016-11-28 LAB — IRON: Iron: 88 ug/dL (ref 50–180)

## 2016-11-28 NOTE — Progress Notes (Signed)
noted 

## 2016-11-29 ENCOUNTER — Other Ambulatory Visit: Payer: Self-pay

## 2016-11-29 LAB — HEMOGLOBIN A1C
Hgb A1c MFr Bld: 7.5 % — ABNORMAL HIGH (ref <5.7)
Mean Plasma Glucose: 169 mg/dL

## 2016-11-29 MED ORDER — ACCU-CHEK FASTCLIX LANCETS MISC: 1 refills | Status: DC

## 2016-11-30 ENCOUNTER — Encounter: Payer: Self-pay | Admitting: Family Medicine

## 2016-11-30 ENCOUNTER — Other Ambulatory Visit: Payer: Self-pay

## 2016-11-30 ENCOUNTER — Ambulatory Visit (INDEPENDENT_AMBULATORY_CARE_PROVIDER_SITE_OTHER): Payer: Medicare HMO | Admitting: Family Medicine

## 2016-11-30 VITALS — BP 124/82 | HR 69 | Temp 98.8°F | Ht 69.0 in | Wt 228.0 lb

## 2016-11-30 DIAGNOSIS — Z23 Encounter for immunization: Secondary | ICD-10-CM | POA: Insufficient documentation

## 2016-11-30 DIAGNOSIS — IMO0001 Reserved for inherently not codable concepts without codable children: Secondary | ICD-10-CM

## 2016-11-30 DIAGNOSIS — E1122 Type 2 diabetes mellitus with diabetic chronic kidney disease: Secondary | ICD-10-CM

## 2016-11-30 DIAGNOSIS — E785 Hyperlipidemia, unspecified: Secondary | ICD-10-CM | POA: Diagnosis not present

## 2016-11-30 DIAGNOSIS — N182 Chronic kidney disease, stage 2 (mild): Secondary | ICD-10-CM | POA: Diagnosis not present

## 2016-11-30 DIAGNOSIS — Z0000 Encounter for general adult medical examination without abnormal findings: Secondary | ICD-10-CM | POA: Insufficient documentation

## 2016-11-30 DIAGNOSIS — E119 Type 2 diabetes mellitus without complications: Secondary | ICD-10-CM

## 2016-11-30 DIAGNOSIS — I129 Hypertensive chronic kidney disease with stage 1 through stage 4 chronic kidney disease, or unspecified chronic kidney disease: Secondary | ICD-10-CM

## 2016-11-30 DIAGNOSIS — Z794 Long term (current) use of insulin: Secondary | ICD-10-CM | POA: Diagnosis not present

## 2016-11-30 MED ORDER — PNEUMOCOCCAL VAC POLYVALENT 25 MCG/0.5ML IJ INJ
0.5000 mL | INJECTION | Freq: Once | INTRAMUSCULAR | Status: AC
  Administered 2016-11-30: 0.5 mL via INTRAMUSCULAR

## 2016-11-30 NOTE — Assessment & Plan Note (Addendum)
Welcome to medicare exam as documented.  Advanced Care planning also discussed, patient states tat both he ands hois wife have the material at home , but "just have done nothing about it" HI have encouraged him to start working on this, and will address at another visit

## 2016-11-30 NOTE — Assessment & Plan Note (Signed)
After obtaining informed consent, the vaccine is  administered by LPN.  

## 2016-11-30 NOTE — Progress Notes (Signed)
Preventive Screening-Counseling & Management   Patient present here today for a welcome to  Medicare  wellness visit.   Current Problems (verified)   Medications Prior to Visit Allergies (verified)   PAST HISTORY  Family History  Social History Married , retired , non smoker , no alcohol or drug use   Risk Factors  Current exercise habits:  zero  Dietary issues discussed:low carb, measure protein , starches, water intake goal of 64 ounces daily   Cardiac risk factors: heart disease established personal, dm , hyperlipidemia, htn  Depression Screen  (Note: if answer to either of the following is "Yes", a more complete depression screening is indicated)   Over the past two weeks, have you felt down, depressed or hopeless? No  Over the past two weeks, have you felt little interest or pleasure in doing things? No  Have you lost interest or pleasure in daily life? No  Do you often feel hopeless? No  Do you cry easily over simple problems? No   Activities of Daily Living  In your present state of health, do you have any difficulty performing the following activities?  Driving?: No Managing money?: No Feeding yourself?:No Getting from bed to chair?:No Climbing a flight of stairs?:No Preparing food and eating?:No Bathing or showering?:No Getting dressed?:No Getting to the toilet?:No Using the toilet?:No Moving around from place to place?: No  Fall Risk Assessment In the past year have you fallen or had a near fall?:No Are you currently taking any medications that make you dizzy? no   Hearing Difficulties: No Do you often ask people to speak up or repeat themselves?:No Do you experience ringing or noises in your ears?:No Do you have difficulty understanding soft or whispered voices?:No  Cognitive Testing  Alert? Yes Normal Appearance?Yes  Oriented to person? Yes Place? Yes  Time? Yes  Displays appropriate judgment?Yes  Can read the correct time from a watch face?  yes Are you having problems remembering things?No  Advanced Directives have been discussed with the patient?Yes    List the Names of Other Physician/Practitioners you currently use: Dr Dustin Flock, My eye Doctor   Indicate any recent Medical Services you may have received from other than [facility] providers in the past year (date may be approximate).     Medicare Attestation  I have personally reviewed:  The patient's medical and social history  Their use of alcohol, tobacco or illicit drugs  Their current medications and supplements  The patient's functional ability including ADLs,fall risks, home safety risks, cognitive, and hearing and visual impairment  Diet and physical activities  Evidence for depression or mood disorders  The patient's weight, height, BMI, and visual acuity have been recorded in the chart. I have made referrals, counseling, and provided education to the patient based on review of the above and I have provided the patient with a written personalized care plan for preventive services.    Physical Exam BP (!) 148/92 (BP Location: Left Arm, Patient Position: Sitting, Cuff Size: Normal)   Pulse 69   Temp 98.8 F (37.1 C)   Ht 5\' 9"  (1.753 m)   Wt 228 lb (103.4 kg)   SpO2 94%   BMI 33.67 kg/m   EKG: NSR, inferior infarct undetermined age, no change from previous  Assessment & Plan:  Need for 23-polyvalent pneumococcal polysaccharide vaccine After obtaining informed consent, the vaccine is  administered by LPN.   Welcome to Medicare preventive visit Welcome to medicare exam as documented.  Advanced Care planning also  discussed, patient states tat both he ands hois wife have the material at home , but "just have done nothing about it" HI have encouraged him to start working on this, and will address at another visit  Need for immunization against influenza After obtaining informed consent, the vaccine is  administered by LPN.

## 2016-11-30 NOTE — Patient Instructions (Addendum)
F/u f/u first week in January, call if you need me sooner  Congrats on 66, heading for 89!  Flu and pneumonia 23 vaccines today  You are referred to  Podiatrist in your plan  Pls commit to daily exercise   Blood sugar slightly better  You should work on living Will as we discussed  Fasting lipid, cmp and eGFR and hBA1C 5 days before next visit  Thank you  for choosing [facility]. We consider it a privelige to serve you.  Delivering excellent health care in a caring and  compassionate way is our goal.  Partnering with you,  so that together we can achieve this goal is our strategy.

## 2016-12-01 ENCOUNTER — Encounter: Payer: Self-pay | Admitting: Family Medicine

## 2016-12-01 DIAGNOSIS — Z23 Encounter for immunization: Secondary | ICD-10-CM | POA: Insufficient documentation

## 2016-12-01 NOTE — Assessment & Plan Note (Signed)
After obtaining informed consent, the vaccine is  administered by LPN.  

## 2016-12-19 ENCOUNTER — Other Ambulatory Visit: Payer: Self-pay

## 2016-12-19 MED ORDER — ACCU-CHEK NANO SMARTVIEW W/DEVICE KIT: PACK | 0 refills | Status: DC

## 2016-12-19 MED ORDER — METOPROLOL SUCCINATE ER 50 MG PO TB24: ORAL_TABLET | ORAL | 3 refills | Status: DC

## 2016-12-21 ENCOUNTER — Ambulatory Visit: Payer: BLUE CROSS/BLUE SHIELD | Admitting: Nutrition

## 2017-01-16 ENCOUNTER — Encounter: Payer: Self-pay | Admitting: Cardiology

## 2017-01-16 ENCOUNTER — Ambulatory Visit (INDEPENDENT_AMBULATORY_CARE_PROVIDER_SITE_OTHER): Payer: Medicare HMO | Admitting: Cardiology

## 2017-01-16 VITALS — BP 124/74 | HR 84 | Ht 69.0 in | Wt 228.0 lb

## 2017-01-16 DIAGNOSIS — I2510 Atherosclerotic heart disease of native coronary artery without angina pectoris: Secondary | ICD-10-CM

## 2017-01-16 DIAGNOSIS — E782 Mixed hyperlipidemia: Secondary | ICD-10-CM

## 2017-01-16 DIAGNOSIS — I10 Essential (primary) hypertension: Secondary | ICD-10-CM

## 2017-01-16 MED ORDER — ATORVASTATIN CALCIUM 40 MG PO TABS: 40.0000 mg | ORAL_TABLET | Freq: Every day | ORAL | 3 refills | Status: DC

## 2017-01-16 NOTE — Patient Instructions (Signed)
Your physician wants you to follow-up in: 6 months with Dr.Branch You will receive a reminder letter in the mail two months in advance. If you don't receive a letter, please call our office to schedule the follow-up appointment.     INCREASE Atorvastatin to 40 mg at dinner.  All other medications stay the same.    No lab work or tests ordered today.      Thank you for choosing [facility] !

## 2017-01-16 NOTE — Progress Notes (Signed)
Clinical Summary Mr. Ryder is a 66 y.o.male seen today for follow up of the following medical problems.   1. CAD - history of prior stenting as described below -08/2014 exercise nuclear stress with Duke treadmill score of 7 consistent with low risk, small area of inferolateral ischemia. Mild inferior ischemia has been noted previously in 12/2009 stress, from notes he has history of RCA CTO that was not able to be opened during 2003 cath in [place].    - just occasional chest pain at times since last visit, mild and infrequent. No significant SOB/DOE  2. HTN - home bps 120s/70s  3. Hyperlipidemia - compliant with statin - last lipid panel 08/2016 TC 128 TG 84 HDL 38 LDL 73  4. DM II - followed by Dr Moshe Cipro -Jan 2018 HgbA1c 6.9  5. OSA -  mixed compliance with cpap  6. AAA screen - no aneurysm by [place] 08/2010 Past Medical History:  Diagnosis Date  . Arteriosclerotic cardiovascular disease (ASCVD)    DES to LAD D1 - 2003 in [place]; failed intervention for a totally obstructed RCA at that time; EF of 45%; 12/2009: Equivocal stress nuclear with good exercise tolerance, negative stress EKG, normal EF with mild mid and distal inferior ischemia  . Coronary artery disease   . Diabetes mellitus, type II ([place])   . Hyperlipidemia    Lipid profile in 09/2011:128, 130, 33, 69  . Hypertension    Lab  09/2011: Normal CMet ex G-133  . Tobacco abuse, in remission    30 pack years; quit in 1985     Allergies  Allergen Reactions  . Rosuvastatin Other (See Comments)    Adverse GI symptoms     Current Outpatient Prescriptions  Medication Sig Dispense Refill  . ACCU-CHEK FASTCLIX LANCETS MISC Use up to three times daily Dx e11.22 300 each 1  . acetaminophen (TYLENOL) 500 MG tablet Take 1,000 mg by mouth every 6 (six) hours as needed for mild pain.    Marland Kitchen aspirin 81 MG tablet Take 81 mg by mouth daily.      Marland Kitchen atorvastatin (LIPITOR) 20 MG tablet Take 1 tablet (20 mg total) by mouth  daily. 90 tablet 3  . Blood Glucose Monitoring Suppl (ACCU-CHEK NANO SMARTVIEW) w/Device KIT Test 1-2 times daily 1 kit 0  . glipiZIDE (GLUCOTROL) 10 MG tablet TAKE 1 TABLET TWICE A DAY BEFORE MEALS 180 tablet 1  . glipiZIDE (GLUCOTROL) 10 MG tablet TAKE 1 TABLET TWICE DAILY  BEFORE MEALS 180 tablet 0  . Insulin Detemir (LEVEMIR) 100 UNIT/ML Pen 80 units once daily 45 mL 5  . Insulin Pen Needle (B-D ULTRAFINE III SHORT PEN) 31G X 8 MM MISC USE AS DIRECTED WITH LEVEMIR FLEXPEN daily 100 each 11  . metoprolol succinate (TOPROL-XL) 50 MG 24 hr tablet TAKE 1 TABLET DAILY WITH OR IMMEDIATELY AFTER A MEAL 90 tablet 3  . olmesartan (BENICAR) 20 MG tablet TAKE 1 TABLET DAILY (DOSE REDUCTION) 90 tablet 1  . tadalafil (CIALIS) 5 MG tablet TAKE 1 TABLET (5 MG TOTAL) BY MOUTH DAILY AS NEEDED. 30 tablet 1   No current facility-administered medications for this visit.      Past Surgical History:  Procedure Laterality Date  . CARDIAC CATHETERIZATION    . COLONOSCOPY  2010   Negative screening study  . COLONOSCOPY N/A 11/11/2014   Procedure: COLONOSCOPY;  Surgeon: Rogene Houston, MD;  Location: [facility];  Service: Endoscopy;  Laterality: N/A;  1030  .  EPIDIDYMIS SURGERY  1996  . KNEE ARTHROSCOPY W/ MENISCECTOMY  11/2008   Right  . PRESSURE ULCER DEBRIDEMENT  2006   Right lower extremity  . ROTATOR CUFF REPAIR  07/2009   Right     Allergies  Allergen Reactions  . Rosuvastatin Other (See Comments)    Adverse GI symptoms      Family History  Problem Relation Age of Onset  . Diabetes Sister   . Arthritis Brother   . Diabetes Brother   . Cancer Father        Hypernephroma     Social History Mr. Leyendecker reports that he quit smoking about 33 years ago. His smoking use included Cigarettes. He started smoking about 50 years ago. He has a 30.00 pack-year smoking history. He has never used smokeless tobacco. Mr. Rathert reports that he does not drink alcohol.   Review of  Systems CONSTITUTIONAL: No weight loss, fever, chills, weakness or fatigue.  HEENT: Eyes: No visual loss, blurred vision, double vision or yellow sclerae.No hearing loss, sneezing, congestion, runny nose or sore throat.  SKIN: No rash or itching.  CARDIOVASCULAR: per hpi RESPIRATORY: No shortness of breath, cough or sputum.  GASTROINTESTINAL: No anorexia, nausea, vomiting or diarrhea. No abdominal pain or blood.  GENITOURINARY: No burning on urination, no polyuria NEUROLOGICAL: No headache, dizziness, syncope, paralysis, ataxia, numbness or tingling in the extremities. No change in bowel or bladder control.  MUSCULOSKELETAL: No muscle, back pain, joint pain or stiffness.  LYMPHATICS: No enlarged nodes. No history of splenectomy.  PSYCHIATRIC: No history of depression or anxiety.  ENDOCRINOLOGIC: No reports of sweating, cold or heat intolerance. No polyuria or polydipsia.  Marland Kitchen   Physical Examination Vitals:   01/16/17 1456  BP: 124/74  Pulse: 84  SpO2: 96%   Vitals:   01/16/17 1456  Weight: 228 lb (103.4 kg)  Height: _0  (1.753 m)    Gen: resting comfortably, no acute distress HEENT: no scleral icterus, pupils equal round and reactive, no palptable cervical adenopathy,  CV: RRR, no m/rg, no jvd Resp: Clear to auscultation bilaterally GI: abdomen is soft, non-tender, non-distended, normal bowel sounds, no hepatosplenomegaly MSK: extremities are warm, no edema.  Skin: warm, no rash Neuro:  no focal deficits Psych: appropriate affect   Diagnostic Studies 08/2014 Exercise Nuclear stress  Low risk Duke treadmill score of 7.  Blood pressure demonstrated a hypertensive response to exercise.  There was no ST segment deviation noted during stress.  There is a small defect of mild severity present in the basal inferolateral, mid inferolateral and apical inferior location. The defect is reversible. Consistent with mild, predominantly mid to basal inferolateral  ischemia.  Myocardial perfusion is abnormal. This is a low risk study.    Assessment and Plan  1. CAD - chronic atypical chest pain symptoms that are unchanged. Overall mild and infrequent symptoms.  - we will continue current meds  2. HTN - at goal,continue current meds  3. Hyperlipidemia - given his CAD should be on higher dose statin, we will initially increase atorva to 97m daily. Consider higher dosing if tolerated.        JArnoldo Lenis M.D.

## 2017-02-27 ENCOUNTER — Other Ambulatory Visit: Payer: Self-pay | Admitting: Family Medicine

## 2017-03-01 NOTE — Telephone Encounter (Signed)
Seen 8 30 18 

## 2017-03-05 ENCOUNTER — Telehealth: Payer: Self-pay | Admitting: Family Medicine

## 2017-03-05 ENCOUNTER — Other Ambulatory Visit: Payer: Self-pay

## 2017-03-05 MED ORDER — OLMESARTAN MEDOXOMIL 20 MG PO TABS: ORAL_TABLET | ORAL | 1 refills | Status: DC

## 2017-03-05 NOTE — Telephone Encounter (Signed)
Patient is requesting a refill of olmesartan (BENICAR) 20 MG tablet   [facility] [place]

## 2017-03-05 NOTE — Telephone Encounter (Signed)
Refill sent.

## 2017-03-06 ENCOUNTER — Other Ambulatory Visit: Payer: Self-pay

## 2017-03-06 MED ORDER — GLUCOSE BLOOD VI STRP: ORAL_STRIP | 1 refills | Status: DC

## 2017-03-07 ENCOUNTER — Other Ambulatory Visit: Payer: Self-pay

## 2017-03-07 MED ORDER — OLMESARTAN MEDOXOMIL 20 MG PO TABS: ORAL_TABLET | ORAL | 1 refills | Status: DC

## 2017-04-04 ENCOUNTER — Ambulatory Visit: Payer: Medicare HMO | Admitting: Family Medicine

## 2017-04-04 ENCOUNTER — Encounter: Payer: Self-pay | Admitting: Family Medicine

## 2017-04-06 ENCOUNTER — Telehealth: Payer: Self-pay | Admitting: *Deleted

## 2017-04-06 NOTE — Telephone Encounter (Signed)
Pt has an appt 04/20/2017 with Dr. March Rummage. Pt states the pain began Sunday, and is getting better but is extremely painful 1st thing in the morning. I told pt to be consistent with icing 3-4 times a day for 15-88minutes per session, protecting the skin from the ice pack with a light towel, take OTC antiinflammatories if tolerates and wear supportive athletic shoes. Pt states his doctor told him not to take the antiinflammatories, so I told him not take the NSAIDS, I told pt he could do a light stretch while still in bed or prior to getting up from a seated position, take and point toes to your chest and push your heels away from you and hold 30 seconds, this would decrease the abrupt stretching he got when 1st standing. Pt states understanding.

## 2017-04-09 DIAGNOSIS — N182 Chronic kidney disease, stage 2 (mild): Secondary | ICD-10-CM | POA: Diagnosis not present

## 2017-04-09 DIAGNOSIS — Z794 Long term (current) use of insulin: Secondary | ICD-10-CM | POA: Diagnosis not present

## 2017-04-09 DIAGNOSIS — I129 Hypertensive chronic kidney disease with stage 1 through stage 4 chronic kidney disease, or unspecified chronic kidney disease: Secondary | ICD-10-CM | POA: Diagnosis not present

## 2017-04-09 DIAGNOSIS — E785 Hyperlipidemia, unspecified: Secondary | ICD-10-CM | POA: Diagnosis not present

## 2017-04-09 DIAGNOSIS — Z23 Encounter for immunization: Secondary | ICD-10-CM | POA: Diagnosis not present

## 2017-04-09 DIAGNOSIS — E1122 Type 2 diabetes mellitus with diabetic chronic kidney disease: Secondary | ICD-10-CM | POA: Diagnosis not present

## 2017-04-09 DIAGNOSIS — E119 Type 2 diabetes mellitus without complications: Secondary | ICD-10-CM | POA: Diagnosis not present

## 2017-04-09 DIAGNOSIS — Z0000 Encounter for general adult medical examination without abnormal findings: Secondary | ICD-10-CM | POA: Diagnosis not present

## 2017-04-10 LAB — COMPLETE METABOLIC PANEL WITH GFR
AG RATIO: 1.2 (calc) (ref 1.0–2.5)
ALBUMIN MSPROF: 4 g/dL (ref 3.6–5.1)
ALT: 20 U/L (ref 9–46)
AST: 19 U/L (ref 10–35)
Alkaline phosphatase (APISO): 126 U/L — ABNORMAL HIGH (ref 40–115)
BUN: 14 mg/dL (ref 7–25)
CALCIUM: 9.4 mg/dL (ref 8.6–10.3)
CO2: 27 mmol/L (ref 20–32)
Chloride: 103 mmol/L (ref 98–110)
Creat: 1.24 mg/dL (ref 0.70–1.25)
GFR, EST AFRICAN AMERICAN: 70 mL/min/{1.73_m2} (ref >=60)
GFR, EST NON AFRICAN AMERICAN: 61 mL/min/{1.73_m2} (ref >=60)
GLOBULIN: 3.3 g/dL (ref 1.9–3.7)
Glucose, Bld: 94 mg/dL (ref 65–99)
POTASSIUM: 5 mmol/L (ref 3.5–5.3)
Sodium: 139 mmol/L (ref 135–146)
TOTAL PROTEIN: 7.3 g/dL (ref 6.1–8.1)
Total Bilirubin: 0.5 mg/dL (ref 0.2–1.2)

## 2017-04-10 LAB — LIPID PANEL
Cholesterol: 119 mg/dL (ref <200)
HDL: 32 mg/dL — AB (ref >40)
LDL Cholesterol (Calc): 61 mg/dL (calc)
Non-HDL Cholesterol (Calc): 87 mg/dL (calc) (ref <130)
Total CHOL/HDL Ratio: 3.7 (calc) (ref <5.0)
Triglycerides: 183 mg/dL — ABNORMAL HIGH (ref <150)

## 2017-04-10 LAB — HEMOGLOBIN A1C
Hgb A1c MFr Bld: 8.2 % of total Hgb — ABNORMAL HIGH (ref <5.7)
MEAN PLASMA GLUCOSE: 189 (calc)
eAG (mmol/L): 10.4 (calc)

## 2017-04-16 ENCOUNTER — Encounter: Payer: Self-pay | Admitting: Family Medicine

## 2017-04-16 ENCOUNTER — Ambulatory Visit (INDEPENDENT_AMBULATORY_CARE_PROVIDER_SITE_OTHER): Payer: Medicare HMO | Admitting: Family Medicine

## 2017-04-16 ENCOUNTER — Other Ambulatory Visit: Payer: Self-pay

## 2017-04-16 VITALS — BP 132/82 | HR 104 | Resp 16 | Ht 69.0 in | Wt 227.0 lb

## 2017-04-16 DIAGNOSIS — IMO0001 Reserved for inherently not codable concepts without codable children: Secondary | ICD-10-CM

## 2017-04-16 DIAGNOSIS — G8929 Other chronic pain: Secondary | ICD-10-CM | POA: Diagnosis not present

## 2017-04-16 DIAGNOSIS — I129 Hypertensive chronic kidney disease with stage 1 through stage 4 chronic kidney disease, or unspecified chronic kidney disease: Secondary | ICD-10-CM | POA: Diagnosis not present

## 2017-04-16 DIAGNOSIS — E1122 Type 2 diabetes mellitus with diabetic chronic kidney disease: Secondary | ICD-10-CM

## 2017-04-16 DIAGNOSIS — E1065 Type 1 diabetes mellitus with hyperglycemia: Secondary | ICD-10-CM | POA: Diagnosis not present

## 2017-04-16 DIAGNOSIS — M545 Low back pain: Secondary | ICD-10-CM | POA: Diagnosis not present

## 2017-04-16 DIAGNOSIS — E785 Hyperlipidemia, unspecified: Secondary | ICD-10-CM

## 2017-04-16 DIAGNOSIS — M722 Plantar fascial fibromatosis: Secondary | ICD-10-CM | POA: Diagnosis not present

## 2017-04-16 DIAGNOSIS — Z794 Long term (current) use of insulin: Principal | ICD-10-CM

## 2017-04-16 DIAGNOSIS — E669 Obesity, unspecified: Secondary | ICD-10-CM | POA: Diagnosis not present

## 2017-04-16 DIAGNOSIS — E119 Type 2 diabetes mellitus without complications: Principal | ICD-10-CM

## 2017-04-16 DIAGNOSIS — G4730 Sleep apnea, unspecified: Secondary | ICD-10-CM | POA: Insufficient documentation

## 2017-04-16 DIAGNOSIS — N182 Chronic kidney disease, stage 2 (mild): Secondary | ICD-10-CM

## 2017-04-16 MED ORDER — INSULIN PEN NEEDLE 31G X 8 MM MISC: 5 refills | Status: DC

## 2017-04-16 MED ORDER — INSULIN DETEMIR 100 UNIT/ML FLEXPEN: PEN_INJECTOR | SUBCUTANEOUS | 5 refills | Status: DC

## 2017-04-16 NOTE — Progress Notes (Signed)
Robert [name]     MRN: [id number]      DOB: 1950-05-26   HPI Robert [name] is here for follow up and re-evaluation of chronic medical conditions, medication management and review of any available recent lab and radiology data.  Preventive health is updated, specifically  Cancer screening and Immunization.   Questions or concerns regarding consultations or procedures which the PT has had in the interim are  addressed. The PT denies any adverse reactions to current medications since the last visit.  La st week Saturday had an episode when had had a massive BM no straining but lay on the floor sweat nearly passed out , fourth time over the years  Increassed hand and back pain 2 week h/o right foot pain, has appt with podiatry this week, ibuprofen affords some relief. No exercise, no consistent healthy diabetic diet, wife present , states he will change behavior ROS Denies recent fever or chills. Denies sinus pressure, nasal congestion, ear pain or sore throat. Denies chest congestion, productive cough or wheezing. Denies chest pains, palpitations and leg swelling Denies abdominal pain, nausea, vomiting,diarrhea or constipation.   Denies dysuria, frequency, hesitancy or incontinence.  Denies headaches, seizures, numbness, or tingling. Denies depression, anxiety or insomnia. Denies skin break down or rash.   PE  BP 132/82   Pulse (!) 104   Resp 16   Ht 5\' 9"  (1.753 m)   Wt 227 lb (103 kg)   SpO2 94%   BMI 33.52 kg/m   Patient alert and oriented and in no cardiopulmonary distress.  HEENT: No facial asymmetry, EOMI,   oropharynx pink and moist.  Neck supple no JVD, no mass.  Chest: Clear to auscultation bilaterally.  CVS: S1, S2 no murmurs, no S3.Regular rate.  ABD: Soft non tender.   Ext: No edema  MS: Adequate though reduced  ROM spine, shoulders, hips and knees.Deformity of fingers present  Skin: Intact, no ulcerations or rash noted.  Psych: Good eye contact, normal  affect. Memory intact not anxious or depressed appearing.  CNS: CN 2-12 intact, power,  normal throughout.no focal deficits noted.   Assessment & Plan  Hypertension associated with stage 2 chronic kidney disease due to type 2 diabetes mellitus Controlled, no change in medication DASH diet and commitment to daily physical activity for a minimum of 30 minutes discussed and encouraged, as a part of hypertension management. The importance of attaining a healthy weight is also discussed.  BP/Weight 04/16/2017 01/16/2017 11/30/2016 08/15/2016 07/10/2016 06/20/2016 7/51/7001  Systolic BP 749 449 675 916 384 - 665  Diastolic BP 82 74 82 74 70 - 74  Wt. (Lbs) 227 228 228 225 221 226 223  BMI 33.52 33.67 33.67 33.23 32.64 33.37 32.93       Diabetes mellitus, insulin dependent (IDDM), controlled Deteriorated Increase glipizide to twice daily Robert [name] is reminded of the importance of commitment to daily physical activity for 30 minutes or more, as able and the need to limit carbohydrate intake to 30 to 60 grams per meal to help with blood sugar control.   The need to take medication as prescribed, test blood sugar as directed, and to call between visits if there is a concern that blood sugar is uncontrolled is also discussed.   Robert [name] is reminded of the importance of daily foot exam, annual eye examination, and good blood sugar, blood pressure and cholesterol control.  Diabetic Labs Latest Ref Rng & Units 04/09/2017 11/28/2016 08/11/2016 04/13/2016 04/11/2016  HbA1c <5.7 %  of total Hgb 8.2(H) 7.5(H) 7.6(H) - 6.9(H)  Microalbumin Not Estab. ug/mL - - - 16.3(H) -  Micro/Creat Ratio 0.0 - 30.0 mg/g creat - - - 7.8 -  Chol <200 mg/dL 119 - 128 - 93  HDL >40 mg/dL 32(L) - 38(L) - 31(L)  Calc LDL <100 mg/dL - - 73 - 43  Triglycerides <150 mg/dL 183(H) - 84 - 96  Creatinine 0.70 - 1.25 mg/dL 1.24 1.30(H) 1.40(H) - 1.33(H)   BP/Weight 04/16/2017 01/16/2017 11/30/2016 08/15/2016 07/10/2016 06/20/2016 5/88/5027   Systolic BP 741 287 867 672 094 - 709  Diastolic BP 82 74 82 74 70 - 74  Wt. (Lbs) 227 228 228 225 221 226 223  BMI 33.52 33.67 33.67 33.23 32.64 33.37 32.93   Foot/eye exam completion dates Latest Ref Rng & Units 08/15/2016 04/13/2016  Eye Exam No Retinopathy - -  Foot exam Order - - -  Foot Form Completion - Done Done   Updated lab needed at/ before next visit.      Obesity (BMI 30.0-34.9) Deteriorated. Patient re-educated about  the importance of commitment to a  minimum of 150 minutes of exercise per week.  The importance of healthy food choices with portion control discussed. Encouraged to start a food diary, count calories and to consider  joining a support group. Sample diet sheets offered. Goals set by the patient for the next several months.   Weight /BMI 04/16/2017 01/16/2017 11/30/2016  WEIGHT 227 lb 228 lb 228 lb  HEIGHT 5\' 9"  5\' 9"  5\' 9"   BMI 33.52 kg/m2 33.67 kg/m2 33.67 kg/m2      Sleep apnea in adult Non compliant , and presents with chronic fatigue and lack of exercise, importance of regular use is stresssed  Hyperlipidemia LDL goal <70 Hyperlipidemia:Low fat diet discussed and encouraged.   Lipid Panel  Lab Results  Component Value Date   CHOL 119 04/09/2017   HDL 32 (L) 04/09/2017   LDLCALC 73 08/11/2016   TRIG 183 (H) 04/09/2017   CHOLHDL 3.7 04/09/2017   Needs to lower fat to improve tG and increase exercise to improve hDL    Back pain Weight loss and tylenol use  Plantar fasciitis, right 2 week flare, needs to see Podiatry this week, and may use ibuprofen 200 mg daily for next 5 days

## 2017-04-16 NOTE — Assessment & Plan Note (Signed)
Controlled, no change in medication DASH diet and commitment to daily physical activity for a minimum of 30 minutes discussed and encouraged, as a part of hypertension management. The importance of attaining a healthy weight is also discussed.  BP/Weight 04/16/2017 01/16/2017 11/30/2016 08/15/2016 07/10/2016 06/20/2016 3/88/7195  Systolic BP 974 718 550 158 682 - 574  Diastolic BP 82 74 82 74 70 - 74  Wt. (Lbs) 227 228 228 225 221 226 223  BMI 33.52 33.67 33.67 33.23 32.64 33.37 32.93

## 2017-04-16 NOTE — Assessment & Plan Note (Signed)
Deteriorated Increase glipizide to twice daily Mr. Heavner is reminded of the importance of commitment to daily physical activity for 30 minutes or more, as able and the need to limit carbohydrate intake to 30 to 60 grams per meal to help with blood sugar control.   The need to take medication as prescribed, test blood sugar as directed, and to call between visits if there is a concern that blood sugar is uncontrolled is also discussed.   Mr. Scalisi is reminded of the importance of daily foot exam, annual eye examination, and good blood sugar, blood pressure and cholesterol control.  Diabetic Labs Latest Ref Rng & Units 04/09/2017 11/28/2016 08/11/2016 04/13/2016 04/11/2016  HbA1c <5.7 % of total Hgb 8.2(H) 7.5(H) 7.6(H) - 6.9(H)  Microalbumin Not Estab. ug/mL - - - 16.3(H) -  Micro/Creat Ratio 0.0 - 30.0 mg/g creat - - - 7.8 -  Chol <200 mg/dL 119 - 128 - 93  HDL >40 mg/dL 32(L) - 38(L) - 31(L)  Calc LDL <100 mg/dL - - 73 - 43  Triglycerides <150 mg/dL 183(H) - 84 - 96  Creatinine 0.70 - 1.25 mg/dL 1.24 1.30(H) 1.40(H) - 1.33(H)   BP/Weight 04/16/2017 01/16/2017 11/30/2016 08/15/2016 07/10/2016 06/20/2016 0/16/0109  Systolic BP 323 557 322 025 427 - 062  Diastolic BP 82 74 82 74 70 - 74  Wt. (Lbs) 227 228 228 225 221 226 223  BMI 33.52 33.67 33.67 33.23 32.64 33.37 32.93   Foot/eye exam completion dates Latest Ref Rng & Units 08/15/2016 04/13/2016  Eye Exam No Retinopathy - -  Foot exam Order - - -  Foot Form Completion - Done Done   Updated lab needed at/ before next visit.

## 2017-04-16 NOTE — Assessment & Plan Note (Signed)
2 week flare, needs to see Podiatry this week, and may use ibuprofen 200 mg daily for next 5 days

## 2017-04-16 NOTE — Patient Instructions (Addendum)
Physical exam mid to end May, call if you need me before  HBA1C , chem 7 and EGFR non fast 1 week before next visit  OK ibuprofen  200 mg one twice dily for next 3 days for right foot pain  Commit to correnct food choice ,  Once  Or twice daily blood sugar testing   Glipizide 10 mg one twice daily and insulin 75 units daily  It is important that you exercise regularly at least 30 minutes 5 times a week. If you develop chest pain, have severe difficulty breathing, or feel very tired, stop exercising immediately and seek medical attention    Please use CPAP every night to protect heart, lungs and brain amnd reduce chance of passing out  Joint pains are from arhtritis and tylenol, exercise and weight management are recommended, swimming is BEST

## 2017-04-16 NOTE — Assessment & Plan Note (Signed)
Weight loss and tylenol use

## 2017-04-16 NOTE — Assessment & Plan Note (Signed)
Hyperlipidemia:Low fat diet discussed and encouraged.   Lipid Panel  Lab Results  Component Value Date   CHOL 119 04/09/2017   HDL 32 (L) 04/09/2017   LDLCALC 73 08/11/2016   TRIG 183 (H) 04/09/2017   CHOLHDL 3.7 04/09/2017   Needs to lower fat to improve tG and increase exercise to improve hDL

## 2017-04-16 NOTE — Assessment & Plan Note (Signed)
Deteriorated. Patient re-educated about  the importance of commitment to a  minimum of 150 minutes of exercise per week.  The importance of healthy food choices with portion control discussed. Encouraged to start a food diary, count calories and to consider  joining a support group. Sample diet sheets offered. Goals set by the patient for the next several months.   Weight /BMI 04/16/2017 01/16/2017 11/30/2016  WEIGHT 227 lb 228 lb 228 lb  HEIGHT 5\' 9"  5\' 9"  5\' 9"   BMI 33.52 kg/m2 33.67 kg/m2 33.67 kg/m2

## 2017-04-16 NOTE — Assessment & Plan Note (Signed)
Non compliant , and presents with chronic fatigue and lack of exercise, importance of regular use is stresssed

## 2017-04-20 ENCOUNTER — Ambulatory Visit: Payer: Medicare HMO | Admitting: Podiatry

## 2017-04-20 ENCOUNTER — Encounter: Payer: Self-pay | Admitting: Podiatry

## 2017-04-20 ENCOUNTER — Ambulatory Visit (INDEPENDENT_AMBULATORY_CARE_PROVIDER_SITE_OTHER): Payer: Medicare HMO

## 2017-04-20 ENCOUNTER — Ambulatory Visit: Payer: Self-pay

## 2017-04-20 VITALS — BP 112/69 | HR 79 | Ht 69.0 in | Wt 227.0 lb

## 2017-04-20 DIAGNOSIS — M79672 Pain in left foot: Principal | ICD-10-CM

## 2017-04-20 DIAGNOSIS — L84 Corns and callosities: Secondary | ICD-10-CM | POA: Diagnosis not present

## 2017-04-20 DIAGNOSIS — E1151 Type 2 diabetes mellitus with diabetic peripheral angiopathy without gangrene: Secondary | ICD-10-CM | POA: Diagnosis not present

## 2017-04-20 DIAGNOSIS — M79671 Pain in right foot: Secondary | ICD-10-CM

## 2017-04-20 DIAGNOSIS — M722 Plantar fascial fibromatosis: Secondary | ICD-10-CM | POA: Diagnosis not present

## 2017-04-20 DIAGNOSIS — B351 Tinea unguium: Secondary | ICD-10-CM

## 2017-04-20 MED ORDER — MELOXICAM 7.5 MG PO TABS: 7.5000 mg | ORAL_TABLET | Freq: Every day | ORAL | 0 refills | Status: DC

## 2017-04-20 NOTE — Progress Notes (Signed)
Subjective:  Patient ID: Robert [name], male    DOB: 04/27/1950,  MRN: [id number]  Chief Complaint  Patient presents with  . Foot Pain    bilateral heel and foot pain since December  . Diabetes    last A1C 8.2   . Nail Problem    bilateral elongated toenails   66 y.o. male presents with the above complaint.  Reports pain in both heels since December worse on the right side.  States it has been getting better recently to the point where it is not causing him much pain.  States that when it was at its worst it was worse in the morning.  Also reports history of diabetes mellitus with last A1c of 8.2 and complains of bilateral elongated thickened toenails and callus formation.  Past Medical History:  Diagnosis Date  . Arteriosclerotic cardiovascular disease (ASCVD)    DES to LAD D1 - 2003 in [place]; failed intervention for a totally obstructed RCA at that time; EF of 45%; 12/2009: Equivocal stress nuclear with good exercise tolerance, negative stress EKG, normal EF with mild mid and distal inferior ischemia  . Coronary artery disease   . Diabetes mellitus, type II (HCC)   . Hyperlipidemia    Lipid profile in 09/2011:128, 130, 33, 69  . Hypertension    Lab  09/2011: Normal CMet ex G-133  . Tobacco abuse, in remission    30 pack years; quit in 1985   Past Surgical History:  Procedure Laterality Date  . CARDIAC CATHETERIZATION    . COLONOSCOPY  2010   Negative screening study  . COLONOSCOPY N/A 11/11/2014   Procedure: COLONOSCOPY;  Surgeon: Najeeb U Rehman, MD;  Location: [facility];  Service: Endoscopy;  Laterality: N/A;  1030  . EPIDIDYMIS SURGERY  1996  . KNEE ARTHROSCOPY W/ MENISCECTOMY  11/2008   Right  . PRESSURE ULCER DEBRIDEMENT  2006   Right lower extremity  . ROTATOR CUFF REPAIR  07/2009   Right    Current Outpatient Medications:  .  ACCU-CHEK FASTCLIX LANCETS MISC, Use up to three times daily Dx e11.22, Disp: 300 each, Rfl: 1 .  acetaminophen (TYLENOL) 500 MG tablet,  Take 1,000 mg by mouth every 6 (six) hours as needed for mild pain., Disp: , Rfl:  .  aspirin 81 MG tablet, Take 81 mg by mouth daily.  , Disp: , Rfl:  .  atorvastatin (LIPITOR) 40 MG tablet, Take 1 tablet (40 mg total) by mouth daily., Disp: 90 tablet, Rfl: 3 .  Blood Glucose Monitoring Suppl (ACCU-CHEK NANO SMARTVIEW) w/Device KIT, TEST 1 TO 2 TIMES DAILY, Disp: 1 kit, Rfl: 0 .  glipiZIDE (GLUCOTROL) 10 MG tablet, TAKE 1 TABLET TWICE A DAY BEFORE MEALS, Disp: 180 tablet, Rfl: 1 .  glucose blood (ACCU-CHEK SMARTVIEW) test strip, Use as instructed three times daily DX e11.9, Disp: 300 each, Rfl: 1 .  Insulin Detemir (LEVEMIR) 100 UNIT/ML Pen, 80 units once daily, Disp: 45 mL, Rfl: 5 .  Insulin Pen Needle (B-D ULTRAFINE III SHORT PEN) 31G X 8 MM MISC, USE AS DIRECTED WITH LEVEMIR FLEXPEN daily, Disp: 100 each, Rfl: 5 .  metoprolol succinate (TOPROL-XL) 50 MG 24 hr tablet, TAKE 1 TABLET DAILY WITH OR IMMEDIATELY AFTER A MEAL, Disp: 90 tablet, Rfl: 3 .  olmesartan (BENICAR) 20 MG tablet, TAKE 1 TABLET DAILY (DOSE REDUCTION), Disp: 90 tablet, Rfl: 1 .  Potassium 99 MG TABS, Take 1 tablet by mouth daily., Disp: , Rfl:  .  tadalafil (  CIALIS) 5 MG tablet, TAKE 1 TABLET (5 MG TOTAL) BY MOUTH DAILY AS NEEDED., Disp: 30 tablet, Rfl: 1  Allergies  Allergen Reactions  . Rosuvastatin Other (See Comments)    Adverse GI symptoms     Objective:   Vitals:   04/20/17 0902  BP: 112/69  Pulse: 79   General AA&O x3. Normal mood and affect.  Vascular Dorsalis pedis and posterior tibial pulses  present 1+ and absent bilaterally  Capillary refill normal to all digits. Pedal hair growth normal.  Neurologic Epicritic sensation grossly present bilaterally.  Dermatologic No open lesions. Interspaces clear of maceration. Nails well groomed and normal in appearance. HPK sub-met 5 left  Orthopedic: MMT 5/5 in dorsiflexion, plantarflexion, inversion, and eversion bilaterally. Tender to palpation at the calcaneal  tuber right midly. No pain with calcaneal squeeze bilaterally. Ankle ROM diminished range of motion bilaterally. Silfverskiold Test: positive bilaterally. Hallux limitus bilat. Hammertoes bilat.   Radiographs: Taken and reviewed. No acute fractures. No evidence of calcaneal stress fracture. Plantar and posterior calcaneal spurring noted.  Assessment & Plan:  Patient was evaluated and treated and all questions answered.  Plantar Fasciitis, bilaterally - XR reviewed as above.  - Educated on icing and stretching. Instructions given.  - Night splint dispensed. - Rx short course of meloxicam half strength.  DM with Pre-ulcerative Calluses, PAD -Will get patient an appointment for DM shoes -L 5th MPJ callus pared.  Procedure: Paring of Lesion Rationale: painful hyperkeratotic lesion Type of Debridement: manual, sharp debridement. Instrumentation: 312 blade Number of Lesions: 1   Onychomycosis, DM/PAD -Nails debrided x10 as below.  Procedure: Nail Debridement Rationale: Patient meets criteria for routine foot care due to Class B findings. Type of Debridement: manual, sharp debridement. Instrumentation: Nail nipper, rotary burr. Number of Nails: 10    No Follow-up on file.

## 2017-04-20 NOTE — Patient Instructions (Signed)

## 2017-05-08 ENCOUNTER — Other Ambulatory Visit: Payer: Medicare HMO

## 2017-05-24 ENCOUNTER — Other Ambulatory Visit: Payer: Self-pay

## 2017-05-24 MED ORDER — GLIPIZIDE 10 MG PO TABS: ORAL_TABLET | ORAL | 1 refills | Status: DC

## 2017-05-29 DIAGNOSIS — Z0100 Encounter for examination of eyes and vision without abnormal findings: Secondary | ICD-10-CM | POA: Diagnosis not present

## 2017-05-29 DIAGNOSIS — H25813 Combined forms of age-related cataract, bilateral: Secondary | ICD-10-CM | POA: Diagnosis not present

## 2017-05-29 DIAGNOSIS — H5200 Hypermetropia, unspecified eye: Secondary | ICD-10-CM | POA: Diagnosis not present

## 2017-05-29 DIAGNOSIS — E109 Type 1 diabetes mellitus without complications: Secondary | ICD-10-CM | POA: Diagnosis not present

## 2017-05-29 LAB — HM DIABETES EYE EXAM

## 2017-06-07 ENCOUNTER — Ambulatory Visit (INDEPENDENT_AMBULATORY_CARE_PROVIDER_SITE_OTHER): Payer: Medicare HMO | Admitting: Podiatry

## 2017-06-07 DIAGNOSIS — M205X1 Other deformities of toe(s) (acquired), right foot: Secondary | ICD-10-CM

## 2017-06-07 DIAGNOSIS — M205X2 Other deformities of toe(s) (acquired), left foot: Secondary | ICD-10-CM

## 2017-06-07 DIAGNOSIS — M2042 Other hammer toe(s) (acquired), left foot: Secondary | ICD-10-CM

## 2017-06-07 DIAGNOSIS — E1151 Type 2 diabetes mellitus with diabetic peripheral angiopathy without gangrene: Secondary | ICD-10-CM

## 2017-06-07 DIAGNOSIS — M2041 Other hammer toe(s) (acquired), right foot: Secondary | ICD-10-CM

## 2017-06-07 DIAGNOSIS — B351 Tinea unguium: Secondary | ICD-10-CM

## 2017-06-07 DIAGNOSIS — M722 Plantar fascial fibromatosis: Secondary | ICD-10-CM | POA: Diagnosis not present

## 2017-07-01 NOTE — Progress Notes (Signed)
  Subjective:  Patient ID: Robert [name], male    DOB: [birth year],  MRN: [id number]  Chief Complaint  Patient presents with  . Plantar Fasciitis    F/U B/L fasciitis Pt. stated," Meloxicam helped and the night splint i only used it for 3 weeks. Also, It''s improving, just a little bit of pain; 2/10 achy pain."  . debride    B/L debride -Diabetes type 2 sugar: 130 A1c: 8.2   67 y.o. male presents for follow-up of the above complaint.  Blood sugar this a.m. 130.  Last A1c 8.2.  States that the meloxicam helped his plantar fasciitis and is improving it is just a little bit of pain that he has rated 2 out of 10.  Also request care of his elongated toenails today.  Also here to pick up diabetic shoes.  Objective:   There were no vitals filed for this visit. General AA&O x3. Normal mood and affect.  Vascular Dorsalis pedis and posterior tibial pulses  present 1+ and absent bilaterally  Capillary refill normal to all digits. Pedal hair growth normal.  Neurologic Epicritic sensation grossly present bilaterally.  Dermatologic No open lesions. Interspaces clear of maceration. Nails well groomed and normal in appearance. HPK sub-met 5 left  Orthopedic: MMT 5/5 in dorsiflexion, plantarflexion, inversion, and eversion bilaterally. Tender to palpation at the calcaneal tuber right midly. No pain with calcaneal squeeze bilaterally. Ankle ROM diminished range of motion bilaterally. Silfverskiold Test: positive bilaterally. Hallux limitus bilat. Hammertoes bilat.   Radiographs: Taken and reviewed. No acute fractures. No evidence of calcaneal stress fracture. Plantar and posterior calcaneal spurring noted.  Assessment & Plan:  Patient was evaluated and treated and all questions answered.  Plantar Fasciitis, bilaterally - Improving - Continue meloxicam. - Continue stretching.  DM with Pre-ulcerative Calluses, PAD -DM shoes dispensed.  Onychomycosis, DM/PAD -Nails debrided x10 as  below.  Procedure: Nail Debridement Rationale: Patient meets criteria for routine foot care due to Class B findings. Type of Debridement: manual, sharp debridement. Instrumentation: Nail nipper, rotary burr. Number of Nails: 10   Return in about 1 month (around 07/08/2017) for Diabetic Foot Care, Plantar fasciitis.

## 2017-07-12 ENCOUNTER — Ambulatory Visit: Payer: Medicare HMO | Admitting: Podiatry

## 2017-07-12 DIAGNOSIS — B351 Tinea unguium: Secondary | ICD-10-CM

## 2017-07-12 DIAGNOSIS — E1151 Type 2 diabetes mellitus with diabetic peripheral angiopathy without gangrene: Secondary | ICD-10-CM

## 2017-07-12 DIAGNOSIS — M722 Plantar fascial fibromatosis: Secondary | ICD-10-CM

## 2017-07-26 ENCOUNTER — Ambulatory Visit: Payer: Medicare HMO | Admitting: Cardiology

## 2017-07-26 ENCOUNTER — Encounter: Payer: Self-pay | Admitting: Cardiology

## 2017-07-26 VITALS — BP 124/74 | HR 73 | Ht 69.0 in | Wt 223.0 lb

## 2017-07-26 DIAGNOSIS — Z794 Long term (current) use of insulin: Secondary | ICD-10-CM | POA: Diagnosis not present

## 2017-07-26 DIAGNOSIS — I10 Essential (primary) hypertension: Secondary | ICD-10-CM

## 2017-07-26 DIAGNOSIS — I2510 Atherosclerotic heart disease of native coronary artery without angina pectoris: Secondary | ICD-10-CM

## 2017-07-26 DIAGNOSIS — E119 Type 2 diabetes mellitus without complications: Secondary | ICD-10-CM | POA: Diagnosis not present

## 2017-07-26 DIAGNOSIS — E782 Mixed hyperlipidemia: Secondary | ICD-10-CM

## 2017-07-26 NOTE — Progress Notes (Signed)
Clinical Summary Mr. Buttram is a 67 y.o.male seen today for follow up of the following medical problems.   1. CAD - history of prior stenting as described below -08/2014 exercise nuclear stress with Duke treadmill score of 7 consistent with low risk, small area of inferolateral ischemia. Mild inferior ischemia has been noted previously in 12/2009 stress, from notes he has history of RCA CTO that was not able to be opened during 2003 cath in [place].     - no recent chest pain. No recent SOB/DOE - compliant with meds.   2. HTN - home bp's typically around 130s/80s   3. Hyperlipidemia Jan 2019 TC 119 HDL 32 TG 183 LDL 61 - he is compliant with statin  4. DM II - followed by Dr Moshe Cipro   5. OSA - mixed compliance with cpap  6. AAA screen - no aneurysm by [place] 08/2010    Past Medical History:  Diagnosis Date  . Arteriosclerotic cardiovascular disease (ASCVD)    DES to LAD D1 - 2003 in [place]; failed intervention for a totally obstructed RCA at that time; EF of 45%; 12/2009: Equivocal stress nuclear with good exercise tolerance, negative stress EKG, normal EF with mild mid and distal inferior ischemia  . Coronary artery disease   . Diabetes mellitus, type II ([place])   . Hyperlipidemia    Lipid profile in 09/2011:128, 130, 33, 69  . Hypertension    Lab  09/2011: Normal CMet ex G-133  . Tobacco abuse, in remission    30 pack years; quit in 1985     Allergies  Allergen Reactions  . Rosuvastatin Other (See Comments)    Adverse GI symptoms     Current Outpatient Medications  Medication Sig Dispense Refill  . ACCU-CHEK FASTCLIX LANCETS MISC Use up to three times daily Dx e11.22 300 each 1  . acetaminophen (TYLENOL) 500 MG tablet Take 1,000 mg by mouth every 6 (six) hours as needed for mild pain.    Marland Kitchen aspirin 81 MG tablet Take 81 mg by mouth daily.      Marland Kitchen atorvastatin (LIPITOR) 40 MG tablet Take 1 tablet (40 mg total) by mouth daily. 90 tablet 3  . Blood Glucose  Monitoring Suppl (ACCU-CHEK NANO SMARTVIEW) w/Device KIT TEST 1 TO 2 TIMES DAILY 1 kit 0  . glipiZIDE (GLUCOTROL) 10 MG tablet TAKE 1 TABLET TWICE A DAY BEFORE MEALS 180 tablet 1  . glucose blood (ACCU-CHEK SMARTVIEW) test strip Use as instructed three times daily DX e11.9 300 each 1  . Insulin Detemir (LEVEMIR) 100 UNIT/ML Pen 80 units once daily 45 mL 5  . Insulin Pen Needle (B-D ULTRAFINE III SHORT PEN) 31G X 8 MM MISC USE AS DIRECTED WITH LEVEMIR FLEXPEN daily 100 each 5  . meloxicam (MOBIC) 7.5 MG tablet Take 1 tablet (7.5 mg total) by mouth daily. 21 tablet 0  . metoprolol succinate (TOPROL-XL) 50 MG 24 hr tablet TAKE 1 TABLET DAILY WITH OR IMMEDIATELY AFTER A MEAL 90 tablet 3  . olmesartan (BENICAR) 20 MG tablet TAKE 1 TABLET DAILY (DOSE REDUCTION) 90 tablet 1  . Potassium 99 MG TABS Take 1 tablet by mouth daily.    . tadalafil (CIALIS) 5 MG tablet TAKE 1 TABLET (5 MG TOTAL) BY MOUTH DAILY AS NEEDED. 30 tablet 1   No current facility-administered medications for this visit.      Past Surgical History:  Procedure Laterality Date  . CARDIAC CATHETERIZATION    . COLONOSCOPY  2010  Negative screening study  . COLONOSCOPY N/A 11/11/2014   Procedure: COLONOSCOPY;  Surgeon: Rogene Houston, MD;  Location: [facility];  Service: Endoscopy;  Laterality: N/A;  1030  . EPIDIDYMIS SURGERY  1996  . KNEE ARTHROSCOPY W/ MENISCECTOMY  11/2008   Right  . PRESSURE ULCER DEBRIDEMENT  2006   Right lower extremity  . ROTATOR CUFF REPAIR  07/2009   Right     Allergies  Allergen Reactions  . Rosuvastatin Other (See Comments)    Adverse GI symptoms      Family History  Problem Relation Age of Onset  . Diabetes Sister   . Arthritis Brother   . Diabetes Brother   . Cancer Father        Hypernephroma     Social History Mr. Bastone reports that he quit smoking about 33 years ago. His smoking use included cigarettes. He started smoking about 51 years ago. He has a 30.00 pack-year smoking  history. He has never used smokeless tobacco. Mr. Colaizzi reports that he does not drink alcohol.   Review of Systems CONSTITUTIONAL: No weight loss, fever, chills, weakness or fatigue.  HEENT: Eyes: No visual loss, blurred vision, double vision or yellow sclerae.No hearing loss, sneezing, congestion, runny nose or sore throat.  SKIN: No rash or itching.  CARDIOVASCULAR: per hpi RESPIRATORY: No shortness of breath, cough or sputum.  GASTROINTESTINAL: No anorexia, nausea, vomiting or diarrhea. No abdominal pain or blood.  GENITOURINARY: No burning on urination, no polyuria NEUROLOGICAL: No headache, dizziness, syncope, paralysis, ataxia, numbness or tingling in the extremities. No change in bowel or bladder control.  MUSCULOSKELETAL: No muscle, back pain, joint pain or stiffness.  LYMPHATICS: No enlarged nodes. No history of splenectomy.  PSYCHIATRIC: No history of depression or anxiety.  ENDOCRINOLOGIC: No reports of sweating, cold or heat intolerance. No polyuria or polydipsia.  Marland Kitchen   Physical Examination Vitals:   07/26/17 1013  BP: 124/74  Pulse: 73  SpO2: 95%   Vitals:   07/26/17 1013  Weight: 223 lb (101.2 kg)  Height: '5\' 9"'  (1.753 m)    Gen: resting comfortably, no acute distress HEENT: no scleral icterus, pupils equal round and reactive, no palptable cervical adenopathy,  CV: RRR, no m/r/g, no jvd Resp: Clear to auscultation bilaterally GI: abdomen is soft, non-tender, non-distended, normal bowel sounds, no hepatosplenomegaly MSK: extremities are warm, no edema.  Skin: warm, no rash Neuro:  no focal deficits Psych: appropriate affect   Diagnostic Studies 08/2014 Exercise Nuclear stress  Low risk Duke treadmill score of 7.  Blood pressure demonstrated a hypertensive response to exercise.  There was no ST segment deviation noted during stress.  There is a small defect of mild severity present in the basal inferolateral, mid inferolateral and apical inferior  location. The defect is reversible. Consistent with mild, predominantly mid to basal inferolateral ischemia.  Myocardial perfusion is abnormal. This is a low risk study.      Assessment and Plan  1. CAD - no recent symptoms, continue current meds  2. HTN - at goal, continue current meds  3. Hyperlipidemia - at goal, continue statin  4. DM2 - from cardiac standpoint continue statin and ARB     Arnoldo Lenis, M.D.

## 2017-07-26 NOTE — Patient Instructions (Signed)
Your physician wants you to follow-up in: 1 year with Dr.Branch You will receive a reminder letter in the mail two months in advance. If you don't receive a letter, please call our office to schedule the follow-up appointment.    Your physician recommends that you continue on your current medications as directed. Please refer to the Current Medication list given to you today.   If you need a refill on your cardiac medications before your next appointment, please call your pharmacy.    No lab work or tests ordered today.      Thank you for choosing [facility] !

## 2017-07-31 ENCOUNTER — Encounter: Payer: Self-pay | Admitting: Cardiology

## 2017-07-31 NOTE — Progress Notes (Signed)
  Subjective:  Patient ID: Robert [name], male    DOB: Feb 28, 1951,  MRN: [id number]  Chief Complaint  Patient presents with  . Plantar Fasciitis    Feet feel a whole lot better   67 y.o. male presents for follow-up of the above complaint.  Reports that his feet feel whole lot better.  Denies pain in the feet.  Has been stretching as advised.  Request care of his nails today.  Objective:   There were no vitals filed for this visit. General AA&O x3. Normal mood and affect.  Vascular Dorsalis pedis and posterior tibial pulses  present 1+ and absent bilaterally  Capillary refill normal to all digits. Pedal hair growth normal.  Neurologic Epicritic sensation grossly present bilaterally.  Dermatologic No open lesions. Interspaces clear of maceration. Nails well groomed and normal in appearance. HPK sub-met 5 left  Orthopedic: MMT 5/5 in dorsiflexion, plantarflexion, inversion, and eversion bilaterally. No tenderness palpation about the medial heel tuber    Assessment & Plan:  Patient was evaluated and treated and all questions answered.  Plantar Fasciitis, bilaterally -Improved.  No injections today.  Onychomycosis, DM/PAD -Nails debrided x10 minimally debrided as as he just had them done.  Return in about 3 months (around 10/11/2017) for Diabetic Foot Care.

## 2017-08-15 ENCOUNTER — Telehealth: Payer: Self-pay | Admitting: Family Medicine

## 2017-08-15 NOTE — Telephone Encounter (Signed)
Patient left voicemail that he is constipated and is seeking relief.  He wants to know if he can take duclex tablets because he is diabetic.  Cb#: [phone]/ [phone]

## 2017-08-15 NOTE — Telephone Encounter (Signed)
Please call patient and find out what is going on, what symptoms, how long since bowel movement, etc.

## 2017-08-16 NOTE — Telephone Encounter (Signed)
States he took a laxative and had a bowel movement and things are fine now. Advised to drink plenty of water and fruits and veggies in the future that would help

## 2017-08-21 DIAGNOSIS — Z794 Long term (current) use of insulin: Secondary | ICD-10-CM | POA: Diagnosis not present

## 2017-08-21 DIAGNOSIS — E119 Type 2 diabetes mellitus without complications: Secondary | ICD-10-CM | POA: Diagnosis not present

## 2017-08-22 LAB — BASIC METABOLIC PANEL WITH GFR
BUN/Creatinine Ratio: 11 (calc) (ref 6–22)
BUN: 14 mg/dL (ref 7–25)
CALCIUM: 9.3 mg/dL (ref 8.6–10.3)
CHLORIDE: 107 mmol/L (ref 98–110)
CO2: 26 mmol/L (ref 20–32)
Creat: 1.28 mg/dL — ABNORMAL HIGH (ref 0.70–1.25)
GFR, EST AFRICAN AMERICAN: 68 mL/min/{1.73_m2} (ref >=60)
GFR, Est Non African American: 58 mL/min/{1.73_m2} — ABNORMAL LOW (ref >=60)
Glucose, Bld: 84 mg/dL (ref 65–139)
POTASSIUM: 4.3 mmol/L (ref 3.5–5.3)
SODIUM: 141 mmol/L (ref 135–146)

## 2017-08-22 LAB — HEMOGLOBIN A1C
Hgb A1c MFr Bld: 8 % of total Hgb — ABNORMAL HIGH (ref <5.7)
Mean Plasma Glucose: 183 (calc)
eAG (mmol/L): 10.1 (calc)

## 2017-08-28 ENCOUNTER — Encounter: Payer: Self-pay | Admitting: Family Medicine

## 2017-08-28 ENCOUNTER — Other Ambulatory Visit (HOSPITAL_COMMUNITY)
Admission: RE | Admit: 2017-08-28 | Discharge: 2017-08-28 | Disposition: A | Discharge status: Home / Self Care | Payer: Medicare HMO | Source: Other Acute Inpatient Hospital | Attending: Family Medicine | Admitting: Family Medicine

## 2017-08-28 ENCOUNTER — Ambulatory Visit (INDEPENDENT_AMBULATORY_CARE_PROVIDER_SITE_OTHER): Payer: Medicare HMO | Admitting: Family Medicine

## 2017-08-28 VITALS — BP 138/80 | HR 74 | Resp 16 | Ht 69.0 in | Wt 223.0 lb

## 2017-08-28 DIAGNOSIS — E785 Hyperlipidemia, unspecified: Secondary | ICD-10-CM

## 2017-08-28 DIAGNOSIS — Z794 Long term (current) use of insulin: Secondary | ICD-10-CM

## 2017-08-28 DIAGNOSIS — E1122 Type 2 diabetes mellitus with diabetic chronic kidney disease: Secondary | ICD-10-CM

## 2017-08-28 DIAGNOSIS — E119 Type 2 diabetes mellitus without complications: Secondary | ICD-10-CM

## 2017-08-28 DIAGNOSIS — Z0000 Encounter for general adult medical examination without abnormal findings: Secondary | ICD-10-CM

## 2017-08-28 DIAGNOSIS — E559 Vitamin D deficiency, unspecified: Secondary | ICD-10-CM

## 2017-08-28 DIAGNOSIS — N182 Chronic kidney disease, stage 2 (mild): Secondary | ICD-10-CM

## 2017-08-28 DIAGNOSIS — E669 Obesity, unspecified: Secondary | ICD-10-CM

## 2017-08-28 DIAGNOSIS — Z125 Encounter for screening for malignant neoplasm of prostate: Secondary | ICD-10-CM

## 2017-08-28 DIAGNOSIS — I129 Hypertensive chronic kidney disease with stage 1 through stage 4 chronic kidney disease, or unspecified chronic kidney disease: Secondary | ICD-10-CM

## 2017-08-28 DIAGNOSIS — IMO0001 Reserved for inherently not codable concepts without codable children: Secondary | ICD-10-CM

## 2017-08-28 DIAGNOSIS — E66811 Obesity, class 1: Secondary | ICD-10-CM

## 2017-08-28 NOTE — Assessment & Plan Note (Signed)
Deteriorated. Patient re-educated about  the importance of commitment to a  minimum of 150 minutes of exercise per week.  The importance of healthy food choices with portion control discussed. Encouraged to start a food diary, count calories and to consider  joining a support group. Sample diet sheets offered. Goals set by the patient for the next several months.   Weight /BMI 08/28/2017 07/26/2017 04/20/2017  WEIGHT 223 lb 223 lb 227 lb  HEIGHT 5\' 9"  5\' 9"  5\' 9"   BMI 32.93 kg/m2 32.93 kg/m2 33.52 kg/m2

## 2017-08-28 NOTE — Patient Instructions (Addendum)
Wellness with  Nurse end August or early September  Microalb today from office  MD follow up end November or early December   Fasting lipid, cmp and EGFr, PSA, TSH vit D end August  Please work on [employer] choice and COMMIT on daily exercise for at least 45 minutes , aim for 7.5 , now at 8.0 congrats you have improved!  Tylenol is safe for arthritic pain

## 2017-08-28 NOTE — Assessment & Plan Note (Addendum)
Annual exam as documented. Counseling done  re healthy lifestyle involving commitment to 150 minutes exercise per week, heart healthy diet, and attaining healthy weight.The importance of adequate sleep also discussed.  Needs to return 3 stool cards Immunization and cancer screening needs are specifically addressed at this visit.

## 2017-08-28 NOTE — Progress Notes (Signed)
Robert [name]     MRN: [id number]      DOB: December 12, 1950   HPI: Patient is in for annual physical exam.  Recent labs, are reviewed. Immunization is reviewed , and  updated if needed.    PE;  BP 138/80   Pulse 74   Resp 16   Ht 5\' 9"  (1.753 m)   Wt 223 lb (101.2 kg)   SpO2 96%   BMI 32.93 kg/m   Pleasant male, alert and oriented x 3, in no cardio-pulmonary distress. Afebrile. HEENT No facial trauma or asymetry. Sinuses non tender. EOMI External ears normal, tympanic membranes clear. Oropharynx moist, no exudate. Neck: supple, no adenopathy,JVD or thyromegaly.No bruits.  Chest: Clear to ascultation bilaterally.No crackles or wheezes. Non tender to palpation  Breast: No asymetry,no masses. No nipple discharge or inversion. No axillary or supraclavicular adenopathy  Cardiovascular system; Heart sounds normal,  S1 and  S2 ,no S3.  No murmur, or thrill. Apical beat not displaced Peripheral pulses normal.  Abdomen: Soft, non tender, no organomegaly or masses. No bruits. Bowel sounds normal. No guarding, tenderness or rebound.  Rectal:  Not examined, pt to return 3 stool cards    Musculoskeletal exam: Decreased though adequate l ROM lumbar  spine, normal in hips , shoulders and knees. No deformity ,swelling or crepitus noted. No muscle wasting or atrophy.   Neurologic: Cranial nerves 2 to 12 intact. Power, tone ,sensation and reflexes normal throughout. No disturbance in gait. No tremor.  Skin: Intact, no ulceration, erythema , scaling  Noted.on feet Pigmentation normal throughout  Psych; Normal mood and affect. Judgement and concentration normal   Assessment & Plan:  Annual physical exam Annual exam as documented. Counseling done  re healthy lifestyle involving commitment to 150 minutes exercise per week, heart healthy diet, and attaining healthy weight.The importance of adequate sleep also discussed.  Needs to return 3 stool  cards Immunization and cancer screening needs are specifically addressed at this visit.   Diabetes mellitus, insulin dependent (IDDM), controlled Improved, no med changed as fBG at goal, needs to work on diet and exercise Mr. Sansom is reminded of the importance of commitment to daily physical activity for 30 minutes or more, as able and the need to limit carbohydrate intake to 30 to 60 grams per meal to help with blood sugar control.   The need to take medication as prescribed, test blood sugar as directed, and to call between visits if there is a concern that blood sugar is uncontrolled is also discussed.   [name] is reminded of the importance of daily foot exam, annual eye examination, and good blood sugar, blood pressure and cholesterol control.  Diabetic Labs Latest Ref Rng & Units 08/21/2017 04/09/2017 11/28/2016 08/11/2016 04/13/2016  HbA1c <5.7 % of total Hgb 8.0(H) 8.2(H) 7.5(H) 7.6(H) -  Microalbumin Not Estab. ug/mL - - - - 16.3(H)  Micro/Creat Ratio 0.0 - 30.0 mg/g creat - - - - 7.8  Chol <200 mg/dL - 119 - 128 -  HDL >40 mg/dL - 32(L) - 38(L) -  Calc LDL mg/dL (calc) - 61 - 73 -  Triglycerides <150 mg/dL - 183(H) - 84 -  Creatinine 0.70 - 1.25 mg/dL 1.28(H) 1.24 1.30(H) 1.40(H) -   BP/Weight 08/28/2017 07/26/2017 04/20/2017 04/16/2017 01/16/2017 11/30/2016 9/74/1638  Systolic BP 453 646 803 212 248 250 037  Diastolic BP 80 74 69 82 74 82 74  Wt. (Lbs) 223 223 227 227 228 228 225  BMI 32.93 32.93 33.52  33.52 33.67 33.67 33.23   Foot/eye exam completion dates Latest Ref Rng & Units 08/28/2017 05/29/2017  Eye Exam No Retinopathy - No Retinopathy  Foot exam Order - - -  Foot Form Completion - Done -         Obesity (BMI 30.0-34.9) Deteriorated. Patient re-educated about  the importance of commitment to a  minimum of 150 minutes of exercise per week.  The importance of healthy food choices with portion control discussed. Encouraged to start a food diary, count calories and  to consider  joining a support group. Sample diet sheets offered. Goals set by the patient for the next several months.   Weight /BMI 08/28/2017 07/26/2017 04/20/2017  WEIGHT 223 lb 223 lb 227 lb  HEIGHT 5\' 9"  5\' 9"  5\' 9"   BMI 32.93 kg/m2 32.93 kg/m2 33.52 kg/m2

## 2017-08-28 NOTE — Assessment & Plan Note (Signed)
Improved, no med changed as fBG at goal, needs to work on diet and exercise [name] is reminded of the importance of commitment to daily physical activity for 30 minutes or more, as able and the need to limit carbohydrate intake to 30 to 60 grams per meal to help with blood sugar control.   The need to take medication as prescribed, test blood sugar as directed, and to call between visits if there is a concern that blood sugar is uncontrolled is also discussed.   [name] is reminded of the importance of daily foot exam, annual eye examination, and good blood sugar, blood pressure and cholesterol control.  Diabetic Labs Latest Ref Rng & Units 08/21/2017 04/09/2017 11/28/2016 08/11/2016 04/13/2016  HbA1c <5.7 % of total Hgb 8.0(H) 8.2(H) 7.5(H) 7.6(H) -  Microalbumin Not Estab. ug/mL - - - - 16.3(H)  Micro/Creat Ratio 0.0 - 30.0 mg/g creat - - - - 7.8  Chol <200 mg/dL - 119 - 128 -  HDL >40 mg/dL - 32(L) - 38(L) -  Calc LDL mg/dL (calc) - 61 - 73 -  Triglycerides <150 mg/dL - 183(H) - 84 -  Creatinine 0.70 - 1.25 mg/dL 1.28(H) 1.24 1.30(H) 1.40(H) -   BP/Weight 08/28/2017 07/26/2017 04/20/2017 04/16/2017 01/16/2017 11/30/2016 2/40/9735  Systolic BP 329 924 268 341 962 229 798  Diastolic BP 80 74 69 82 74 82 74  Wt. (Lbs) 223 223 227 227 228 228 225  BMI 32.93 32.93 33.52 33.52 33.67 33.67 33.23   Foot/eye exam completion dates Latest Ref Rng & Units 08/28/2017 05/29/2017  Eye Exam No Retinopathy - No Retinopathy  Foot exam Order - - -  Foot Form Completion - Done -

## 2017-08-29 LAB — MICROALBUMIN / CREATININE URINE RATIO
CREATININE, UR: 167.4 mg/dL
MICROALB UR: 31.3 ug/mL — AB
MICROALB/CREAT RATIO: 18.7 mg/g{creat} (ref 0.0–30.0)

## 2017-09-13 ENCOUNTER — Ambulatory Visit (INDEPENDENT_AMBULATORY_CARE_PROVIDER_SITE_OTHER): Payer: Medicare HMO | Admitting: Podiatry

## 2017-09-13 ENCOUNTER — Encounter: Payer: Self-pay | Admitting: Podiatry

## 2017-09-13 DIAGNOSIS — E1151 Type 2 diabetes mellitus with diabetic peripheral angiopathy without gangrene: Secondary | ICD-10-CM

## 2017-09-13 DIAGNOSIS — B351 Tinea unguium: Secondary | ICD-10-CM | POA: Diagnosis not present

## 2017-09-13 NOTE — Patient Instructions (Signed)

## 2017-09-23 NOTE — Progress Notes (Signed)
  Subjective:  Patient ID: Robert [name], male    DOB: April 09, 1950,  MRN: [id number]  Chief Complaint  Patient presents with  . Diabetic Foot Care    nail trim; sugar-109, A1c-8.0   67 y.o. male presents for follow-up of the above complaint.  Here for routine foot care.  Last sugar 109.  Objective:   There were no vitals filed for this visit. General AA&O x3. Normal mood and affect.  Vascular Dorsalis pedis and posterior tibial pulses  present 1+ and absent bilaterally  Capillary refill normal to all digits. Pedal hair growth normal.  Neurologic Epicritic sensation grossly present bilaterally.  Dermatologic No open lesions. Interspaces clear of maceration. Nails well groomed and normal in appearance. HPK sub-met 5 left  Orthopedic: MMT 5/5 in dorsiflexion, plantarflexion, inversion, and eversion bilaterally. No tenderness palpation about the medial heel tuber    Assessment & Plan:  Patient was evaluated and treated and all questions answered.  Onychomycosis, DM/PAD -Nails debrided x10  Procedure: Nail Debridement Rationale: Patient meets criteria for routine foot care due to PAD Type of Debridement: manual, sharp debridement. Instrumentation: Nail nipper, rotary burr. Number of Nails: 10     Return in about 3 months (around 12/14/2017) for Diabetic Foot Care.

## 2017-11-12 ENCOUNTER — Ambulatory Visit (INDEPENDENT_AMBULATORY_CARE_PROVIDER_SITE_OTHER): Payer: Medicare HMO

## 2017-11-12 VITALS — BP 124/80 | HR 80 | Resp 16 | Ht 69.0 in | Wt 224.0 lb

## 2017-11-12 DIAGNOSIS — Z0000 Encounter for general adult medical examination without abnormal findings: Secondary | ICD-10-CM

## 2017-11-12 MED ORDER — METOPROLOL SUCCINATE ER 50 MG PO TB24: ORAL_TABLET | ORAL | 1 refills | Status: DC

## 2017-11-12 MED ORDER — GLIPIZIDE 10 MG PO TABS: ORAL_TABLET | ORAL | 1 refills | Status: DC

## 2017-11-12 MED ORDER — OLMESARTAN MEDOXOMIL 20 MG PO TABS: ORAL_TABLET | ORAL | 1 refills | Status: DC

## 2017-11-12 NOTE — Patient Instructions (Addendum)
[name] , Thank you for taking time to come for your Medicare Wellness Visit. I appreciate your ongoing commitment to your health goals. Please review the following plan we discussed and let me know if I can assist you in the future.    Ask insurance if Shingrix is covered  Come in on Friday from 8-12 for flu shot starting 11/16/17  Get labs done around 02/15/18   Screening recommendations/referrals: Colonoscopy:  Recommended yearly ophthalmology/optometry visit for glaucoma screening and checkup Recommended yearly dental visit for hygiene and checkup  Vaccinations: Influenza vaccine: due in sept  Pneumococcal vaccine: up to date  Tdap vaccine: up to date  Shingles vaccine: ask insurance if covered     Advanced directives: given   Conditions/risks identified: done   Next appointment: scheduled   Preventive Care 66 Years and Older, Male Preventive care refers to lifestyle choices and visits with your health care provider that can promote health and wellness. What does preventive care include?  A yearly physical exam. This is also called an annual well check.  Dental exams once or twice a year.  Routine eye exams. Ask your health care provider how often you should have your eyes checked.  Personal lifestyle choices, including:  Daily care of your teeth and gums.  Regular physical activity.  Eating a healthy diet.  Avoiding tobacco and drug use.  Limiting alcohol use.  Practicing safe sex.  Taking low doses of aspirin every day.  Taking vitamin and mineral supplements as recommended by your health care provider. What happens during an annual well check? The services and screenings done by your health care provider during your annual well check will depend on your age, overall health, lifestyle risk factors, and family history of disease. Counseling  Your health care provider may ask you questions about your:  Alcohol use.  Tobacco use.  Drug  use.  Emotional well-being.  Home and relationship well-being.  Sexual activity.  Eating habits.  History of falls.  Memory and ability to understand (cognition).  Work and work Statistician. Screening  You may have the following tests or measurements:  Height, weight, and BMI.  Blood pressure.  Lipid and cholesterol levels. These may be checked every 5 years, or more frequently if you are over 66 years old.  Skin check.  Lung cancer screening. You may have this screening every year starting at age 66 if you have a 30-pack-year history of smoking and currently smoke or have quit within the past 15 years.  Fecal occult blood test (FOBT) of the stool. You may have this test every year starting at age 66.  Flexible sigmoidoscopy or colonoscopy. You may have a sigmoidoscopy every 5 years or a colonoscopy every 10 years starting at age 66.  Prostate cancer screening. Recommendations will vary depending on your family history and other risks.  Hepatitis C blood test.  Hepatitis B blood test.  Sexually transmitted disease (STD) testing.  Diabetes screening. This is done by checking your blood sugar (glucose) after you have not eaten for a while (fasting). You may have this done every 1-3 years.  Abdominal aortic aneurysm (AAA) screening. You may need this if you are a current or former smoker.  Osteoporosis. You may be screened starting at age 66 if you are at high risk. Talk with your health care provider about your test results, treatment options, and if necessary, the need for more tests. Vaccines  Your health care provider may recommend certain vaccines, such as:  Influenza vaccine. This is recommended every year.  Tetanus, diphtheria, and acellular pertussis (Tdap, Td) vaccine. You may need a Td booster every 10 years.  Zoster vaccine. You may need this after age 66.  Pneumococcal 13-valent conjugate (PCV13) vaccine. One dose is recommended after age  66.  Pneumococcal polysaccharide (PPSV23) vaccine. One dose is recommended after age 66. Talk to your health care provider about which screenings and vaccines you need and how often you need them. This information is not intended to replace advice given to you by your health care provider. Make sure you discuss any questions you have with your health care provider. Document Released: 04/16/2015 Document Revised: 12/08/2015 Document Reviewed: 01/19/2015 Elsevier Interactive Patient Education  2017 [place] Prevention in the Home Falls can cause injuries. They can happen to people of all ages. There are many things you can do to make your home safe and to help prevent falls. What can I do on the outside of my home?  Regularly fix the edges of walkways and driveways and fix any cracks.  Remove anything that might make you trip as you walk through a door, such as a raised step or threshold.  Trim any bushes or trees on the path to your home.  Use bright outdoor lighting.  Clear any walking paths of anything that might make someone trip, such as rocks or tools.  Regularly check to see if handrails are loose or broken. Make sure that both sides of any steps have handrails.  Any raised decks and porches should have guardrails on the edges.  Have any leaves, snow, or ice cleared regularly.  Use sand or salt on walking paths during winter.  Clean up any spills in your garage right away. This includes oil or grease spills. What can I do in the bathroom?  Use night lights.  Install grab bars by the toilet and in the tub and shower. Do not use towel bars as grab bars.  Use non-skid mats or decals in the tub or shower.  If you need to sit down in the shower, use a plastic, non-slip stool.  Keep the floor dry. Clean up any water that spills on the floor as soon as it happens.  Remove soap buildup in the tub or shower regularly.  Attach bath mats securely with double-sided  non-slip rug tape.  Do not have throw rugs and other things on the floor that can make you trip. What can I do in the bedroom?  Use night lights.  Make sure that you have a light by your bed that is easy to reach.  Do not use any sheets or blankets that are too big for your bed. They should not hang down onto the floor.  Have a firm chair that has side arms. You can use this for support while you get dressed.  Do not have throw rugs and other things on the floor that can make you trip. What can I do in the kitchen?  Clean up any spills right away.  Avoid walking on wet floors.  Keep items that you use a lot in easy-to-reach places.  If you need to reach something above you, use a strong step stool that has a grab bar.  Keep electrical cords out of the way.  Do not use floor polish or wax that makes floors slippery. If you must use wax, use non-skid floor wax.  Do not have throw rugs and other things on the floor that  can make you trip. What can I do with my stairs?  Do not leave any items on the stairs.  Make sure that there are handrails on both sides of the stairs and use them. Fix handrails that are broken or loose. Make sure that handrails are as long as the stairways.  Check any carpeting to make sure that it is firmly attached to the stairs. Fix any carpet that is loose or worn.  Avoid having throw rugs at the top or bottom of the stairs. If you do have throw rugs, attach them to the floor with carpet tape.  Make sure that you have a light switch at the top of the stairs and the bottom of the stairs. If you do not have them, ask someone to add them for you. What else can I do to help prevent falls?  Wear shoes that:  Do not have high heels.  Have rubber bottoms.  Are comfortable and fit you well.  Are closed at the toe. Do not wear sandals.  If you use a stepladder:  Make sure that it is fully opened. Do not climb a closed stepladder.  Make sure that both  sides of the stepladder are locked into place.  Ask someone to hold it for you, if possible.  Clearly mark and make sure that you can see:  Any grab bars or handrails.  First and last steps.  Where the edge of each step is.  Use tools that help you move around (mobility aids) if they are needed. These include:  Canes.  Walkers.  Scooters.  Crutches.  Turn on the lights when you go into a dark area. Replace any light bulbs as soon as they burn out.  Set up your furniture so you have a clear path. Avoid moving your furniture around.  If any of your floors are uneven, fix them.  If there are any pets around you, be aware of where they are.  Review your medicines with your doctor. Some medicines can make you feel dizzy. This can increase your chance of falling. Ask your doctor what other things that you can do to help prevent falls. This information is not intended to replace advice given to you by your health care provider. Make sure you discuss any questions you have with your health care provider. Document Released: 01/14/2009 Document Revised: 08/26/2015 Document Reviewed: 04/24/2014 Elsevier Interactive Patient Education  2017 [employer].

## 2017-11-12 NOTE — Progress Notes (Signed)
Subjective:   Robert [name] is a 67 y.o. male who presents for Medicare Annual/Subsequent preventive examination.  Review of Systems:   Cardiac Risk Factors include: advanced age (>79mn, >>26women);diabetes mellitus;dyslipidemia;hypertension;male gender;obesity (BMI >30kg/m2)     Objective:    Vitals: BP 124/80   Pulse 80   Resp 16   Ht _0  (1.753 m)   Wt 224 lb (101.6 kg)   SpO2 96%   BMI 33.08 kg/m   Body mass index is 33.08 kg/m.  Advanced Directives 11/12/2017 02/01/2016 11/11/2014  Does Patient Have a Medical Advance Directive? Yes No No  Does patient want to make changes to medical advance directive? Yes (ED - Information included in AVS) - -  Would patient like information on creating a medical advance directive? - - Yes - Educational materials given    Tobacco Social History   Tobacco Use  Smoking Status Former Smoker  . Packs/day: 1.00  . Years: 30.00  . Pack years: 30.00  . Types: Cigarettes  . Start date: 04/03/1966  . Last attempt to quit: 09/02/1983  . Years since quitting: 34.2  Smokeless Tobacco Never Used     Counseling given: Not Answered   Clinical Intake:  Pre-visit preparation completed: Yes  Pain : 0-10 Pain Score: 2  Pain Type: Chronic pain Pain Location: Back     Diabetes: Yes CBG done?: No Did pt. bring in CBG monitor from home?: No  How often do you need to have someone help you when you read instructions, pamphlets, or other written materials from your doctor or pharmacy?: 1 - Never  Interpreter Needed?: No     Past Medical History:  Diagnosis Date  . Arteriosclerotic cardiovascular disease (ASCVD)    [place] LAD D1 [place] for a totally obstructed RCA at that time; EF of 45%; 12/2009: Equivocal stress nuclear with good exercise tolerance, negative stress EKG, normal EF with mild mid and distal inferior ischemia  . Coronary artery disease   . Diabetes [place]   .  Hyperlipidemia    Lipid profile in 09/2011:128, 130, 33, 69  . Hypertension    Lab  09/2011: Normal CMet ex G-133  . Tobacco abuse, in remission    30 pack years; quit in 1985   Past Surgical History:  Procedure Laterality Date  . CARDIAC CATHETERIZATION    . COLONOSCOPY  2010   Negative screening study  . COLONOSCOPY N/A 11/11/2014   Procedure: COLONOSCOPY;  Surgeon: NRogene Houston MD;  Location: [facility];  Service: Endoscopy;  Laterality: N/A;  1030  . EPIDIDYMIS SURGERY  1996  . KNEE ARTHROSCOPY W/ MENISCECTOMY  11/2008   Right  . PRESSURE ULCER DEBRIDEMENT  2006   Right lower extremity  . ROTATOR CUFF REPAIR  07/2009   Right   Family History  Problem Relation Age of Onset  . Diabetes Sister   . Arthritis Brother   . Diabetes Brother   . Arthritis Mother   . Cancer Father        Hypernephroma   Social History   Socioeconomic History  . Marital status: Married    Spouse name: Not on file  . Number of children: 2  . Years of education: Not on file  . Highest education level: Some college, no degree  Occupational History  . Occupation: Retired  SScientific laboratory technician . Financial resource strain: Not hard at all  . Food insecurity:  Worry: Never true    Inability: Never true  . Transportation needs:    Medical: No    Non-medical: No  Tobacco Use  . Smoking status: Former Smoker    Packs/day: 1.00    Years: 30.00    Pack years: 30.00    Types: Cigarettes    Start date: 04/03/1966    Last attempt to quit: 09/02/1983    Years since quitting: 34.2  . Smokeless tobacco: Never Used  Substance and Sexual Activity  . Alcohol use: No    Alcohol/week: 0.0 standard drinks    Comment: Former Tax inspector -discontinued use in 2005  . Drug use: No  . Sexual activity: Yes  Lifestyle  . Physical activity:    Days per week: 3 days    Minutes per session: 60 min  . Stress: Not at all  Relationships  . Social connections:    Talks on phone: More than three times a week    Gets  together: More than three times a week    Attends religious service: More than 4 times per year    Active member of club or organization: No    Attends meetings of clubs or organizations: Never    Relationship status: Married  Other Topics Concern  . Not on file  Social History Narrative  . Not on file    Outpatient Encounter Medications as of 11/12/2017  Medication Sig  . ACCU-CHEK FASTCLIX LANCETS MISC Use up to three times daily Dx e11.22  . aspirin 81 MG tablet Take 81 mg by mouth daily.    Marland Kitchen atorvastatin (LIPITOR) 40 MG tablet Take 1 tablet (40 mg total) by mouth daily.  . Blood Glucose Monitoring Suppl (ACCU-CHEK NANO SMARTVIEW) w/Device KIT TEST 1 TO 2 TIMES DAILY  . glipiZIDE (GLUCOTROL) 10 MG tablet TAKE 1 TABLET TWICE A DAY BEFORE MEALS  . glucose blood (ACCU-CHEK SMARTVIEW) test strip Use as instructed three times daily DX e11.9  . Insulin Detemir (LEVEMIR) 100 UNIT/ML Pen 80 units once daily  . Insulin Pen Needle (B-D ULTRAFINE III SHORT PEN) 31G X 8 MM MISC USE AS DIRECTED WITH LEVEMIR FLEXPEN daily  . meloxicam (MOBIC) 7.5 MG tablet Take 1 tablet (7.5 mg total) by mouth daily.  . metoprolol succinate (TOPROL-XL) 50 MG 24 hr tablet TAKE 1 TABLET DAILY WITH OR IMMEDIATELY AFTER A MEAL  . olmesartan (BENICAR) 20 MG tablet TAKE 1 TABLET DAILY (DOSE REDUCTION)  . Potassium 99 MG TABS Take 1 tablet by mouth daily.  . tadalafil (CIALIS) 5 MG tablet TAKE 1 TABLET (5 MG TOTAL) BY MOUTH DAILY AS NEEDED.  . [DISCONTINUED] glipiZIDE (GLUCOTROL) 10 MG tablet TAKE 1 TABLET TWICE A DAY BEFORE MEALS  . [DISCONTINUED] metoprolol succinate (TOPROL-XL) 50 MG 24 hr tablet TAKE 1 TABLET DAILY WITH OR IMMEDIATELY AFTER A MEAL  . [DISCONTINUED] olmesartan (BENICAR) 20 MG tablet TAKE 1 TABLET DAILY (DOSE REDUCTION)   No facility-administered encounter medications on file as of 11/12/2017.     Activities of Daily Living In your present state of health, do you have any difficulty performing the  following activities: 11/12/2017  Hearing? N  Vision? N  Difficulty concentrating or making decisions? N  Walking or climbing stairs? N  Dressing or bathing? N  Doing errands, shopping? N  Preparing Food and eating ? N  Using the Toilet? N  In the past six months, have you accidently leaked urine? N  Do you have problems with loss of bowel control? N  Managing your Medications? N  Managing your Finances? N  Housekeeping or managing your Housekeeping? N  Some recent data might be hidden    Patient Care Team: Fayrene Helper, MD as PCP - [facility], Alphonse Guild, MD as PCP - Cardiology (Cardiology)   Assessment:   This is a routine wellness examination for [place].  Exercise Activities and Dietary recommendations Current Exercise Habits: Structured exercise class, Time (Minutes): 50, Frequency (Times/Week): 3, Weekly Exercise (Minutes/Week): 150, Intensity: Moderate, Exercise limited by: orthopedic condition(s);cardiac condition(s)  Goals   None     Fall Risk Fall Risk  11/12/2017 11/30/2016 06/20/2016 04/13/2016 10/24/2012  Falls in the past year? _0   Risk for fall due to : - - - - Impaired mobility;Impaired balance/gait   Is the patient's home free of loose throw rugs in walkways, pet beds, electrical cords, etc?   yes      Grab bars in the bathroom? yes      Handrails on the stairs?   yes      Adequate lighting?   yes  Timed Get Up and Go Performed:   Depression Screen PHQ 2/9 Scores 11/12/2017 08/28/2017 08/28/2017 11/30/2016  PHQ - 2 Score 0 0 0 0  PHQ- 9 Score - - - -    Cognitive Function     6CIT Screen 11/12/2017  What Year? 0 points  What month? 0 points  What time? 0 points    Immunization History  Administered Date(s) Administered  . Influenza Split 02/14/2011, 12/18/2011  . Influenza Whole 12/25/2006, 12/23/2008, 01/13/2010  . Influenza,inj,Quad PF,6+ Mos 01/10/2013, 12/02/2013, 01/26/2015, 12/09/2015, 11/30/2016  . Pneumococcal  Conjugate-13 11/10/2013  . Pneumococcal Polysaccharide-23 03/10/2004, 01/13/2010, 11/30/2016  . Tdap 12/18/2011  . Zoster 12/18/2011    Qualifies for Shingles Vaccine? Ask insurance if covered   Screening Tests Health Maintenance  Topic Date Due  . INFLUENZA VACCINE  11/01/2017  . HEMOGLOBIN A1C  02/21/2018  . OPHTHALMOLOGY EXAM  05/29/2018  . FOOT EXAM  08/29/2018  . TETANUS/TDAP  12/17/2021  . COLONOSCOPY  11/10/2024  . Hepatitis C Screening  Completed  . HIV Screening  Completed  . PNA vac Low Risk Adult  Completed   Cancer Screenings: Lung: Low Dose CT Chest recommended if Age 68-80 years, 30 pack-year currently smoking OR have quit w/in 15years. Patient does not qualify. Colorectal: done   Additional Screenings:  Hepatitis C Screening:      Plan:     I have personally reviewed and noted the following in the patient's chart:   . Medical and social history . Use of alcohol, tobacco or illicit drugs  . Current medications and supplements . Functional ability and status . Nutritional status . Physical activity . Advanced directives . List of other physicians . Hospitalizations, surgeries, and ER visits in previous 12 months . Vitals . Screenings to include cognitive, depression, and falls . Referrals and appointments  In addition, I have reviewed and discussed with patient certain preventive protocols, quality metrics, and best practice recommendations. A written personalized care plan for preventive services as well as general preventive health recommendations were provided to patient.     Kate Sable, LPN, LPN  [id number]

## 2017-11-13 ENCOUNTER — Telehealth: Payer: Self-pay | Admitting: Family Medicine

## 2017-11-13 NOTE — Telephone Encounter (Signed)
pls contact pt   He needs all labs ordered in May done FASTING  8/21 to  or SHORTLY after ( uncontrolled blood sugar in May, if still up will need sooner appt than his dec physical)  Please have him collect printed order to avoid confusion, thanks

## 2017-11-13 NOTE — Telephone Encounter (Signed)
He collected his order yesterday and is aware to go aug 21

## 2017-11-21 DIAGNOSIS — E1122 Type 2 diabetes mellitus with diabetic chronic kidney disease: Secondary | ICD-10-CM | POA: Diagnosis not present

## 2017-11-21 DIAGNOSIS — I129 Hypertensive chronic kidney disease with stage 1 through stage 4 chronic kidney disease, or unspecified chronic kidney disease: Secondary | ICD-10-CM | POA: Diagnosis not present

## 2017-11-21 DIAGNOSIS — N182 Chronic kidney disease, stage 2 (mild): Secondary | ICD-10-CM | POA: Diagnosis not present

## 2017-11-21 DIAGNOSIS — E559 Vitamin D deficiency, unspecified: Secondary | ICD-10-CM | POA: Diagnosis not present

## 2017-11-21 DIAGNOSIS — Z125 Encounter for screening for malignant neoplasm of prostate: Secondary | ICD-10-CM | POA: Diagnosis not present

## 2017-11-21 DIAGNOSIS — E785 Hyperlipidemia, unspecified: Secondary | ICD-10-CM | POA: Diagnosis not present

## 2017-11-21 DIAGNOSIS — Z794 Long term (current) use of insulin: Secondary | ICD-10-CM | POA: Diagnosis not present

## 2017-11-21 DIAGNOSIS — E119 Type 2 diabetes mellitus without complications: Secondary | ICD-10-CM | POA: Diagnosis not present

## 2017-11-22 ENCOUNTER — Telehealth: Payer: Self-pay

## 2017-11-22 DIAGNOSIS — Z794 Long term (current) use of insulin: Principal | ICD-10-CM

## 2017-11-22 DIAGNOSIS — IMO0001 Reserved for inherently not codable concepts without codable children: Secondary | ICD-10-CM

## 2017-11-22 DIAGNOSIS — E119 Type 2 diabetes mellitus without complications: Principal | ICD-10-CM

## 2017-11-22 LAB — LIPID PANEL
CHOL/HDL RATIO: 3.9 (calc) (ref <5.0)
CHOLESTEROL: 138 mg/dL (ref <200)
HDL: 35 mg/dL — AB (ref >40)
LDL CHOLESTEROL (CALC): 71 mg/dL
Non-HDL Cholesterol (Calc): 103 mg/dL (calc) (ref <130)
Triglycerides: 231 mg/dL — ABNORMAL HIGH (ref <150)

## 2017-11-22 LAB — VITAMIN D 25 HYDROXY (VIT D DEFICIENCY, FRACTURES): VIT D 25 HYDROXY: 23 ng/mL — AB (ref 30–100)

## 2017-11-22 LAB — COMPLETE METABOLIC PANEL WITH GFR
AG Ratio: 1.4 (calc) (ref 1.0–2.5)
ALKALINE PHOSPHATASE (APISO): 121 U/L — AB (ref 40–115)
ALT: 24 U/L (ref 9–46)
AST: 20 U/L (ref 10–35)
Albumin: 4.1 g/dL (ref 3.6–5.1)
BUN / CREAT RATIO: 14 (calc) (ref 6–22)
BUN: 19 mg/dL (ref 7–25)
CALCIUM: 9.1 mg/dL (ref 8.6–10.3)
CO2: 27 mmol/L (ref 20–32)
Chloride: 106 mmol/L (ref 98–110)
Creat: 1.39 mg/dL — ABNORMAL HIGH (ref 0.70–1.25)
GFR, EST NON AFRICAN AMERICAN: 52 mL/min/{1.73_m2} — AB (ref >=60)
GFR, Est African American: 61 mL/min/{1.73_m2} (ref >=60)
Globulin: 3 g/dL (calc) (ref 1.9–3.7)
Glucose, Bld: 110 mg/dL — ABNORMAL HIGH (ref 65–99)
Potassium: 4.6 mmol/L (ref 3.5–5.3)
Sodium: 141 mmol/L (ref 135–146)
Total Bilirubin: 0.5 mg/dL (ref 0.2–1.2)
Total Protein: 7.1 g/dL (ref 6.1–8.1)

## 2017-11-22 LAB — TSH: TSH: 1.81 m[IU]/L (ref 0.40–4.50)

## 2017-11-22 LAB — PSA: PSA: 1.6 ng/mL (ref <=4.0)

## 2017-11-22 NOTE — Telephone Encounter (Signed)
Lab ordered.

## 2017-12-13 ENCOUNTER — Ambulatory Visit: Payer: Medicare HMO | Admitting: Podiatry

## 2017-12-13 DIAGNOSIS — Q828 Other specified congenital malformations of skin: Secondary | ICD-10-CM

## 2017-12-13 DIAGNOSIS — M779 Enthesopathy, unspecified: Secondary | ICD-10-CM

## 2017-12-13 DIAGNOSIS — I878 Other specified disorders of veins: Secondary | ICD-10-CM

## 2017-12-13 DIAGNOSIS — I872 Venous insufficiency (chronic) (peripheral): Secondary | ICD-10-CM | POA: Diagnosis not present

## 2017-12-13 DIAGNOSIS — B351 Tinea unguium: Secondary | ICD-10-CM | POA: Diagnosis not present

## 2017-12-13 DIAGNOSIS — E1151 Type 2 diabetes mellitus with diabetic peripheral angiopathy without gangrene: Secondary | ICD-10-CM | POA: Diagnosis not present

## 2017-12-13 DIAGNOSIS — M7750 Other enthesopathy of unspecified foot: Secondary | ICD-10-CM

## 2017-12-13 NOTE — Patient Instructions (Signed)

## 2017-12-13 NOTE — Progress Notes (Signed)
Subjective:  Patient ID: Robert [name], male    DOB: 08-29-1950,  MRN: [id number]  Chief Complaint  Patient presents with  . Nail Problem      75M DIABETIC NAIL CARE    67 y.o. male presents  for diabetic foot care. Complains of pain in the left ankle from twisting it. States that he is wearing an ankle brace occassionally and it is making it better. Also complains of cramping in his legs. Denies numbness and tingling in their feet. Reports cramping in legs and thighs.  Review of Systems: Negative except as noted in the HPI. Denies N/V/F/Ch.  Past Medical History:  Diagnosis Date  . Arteriosclerotic cardiovascular disease (ASCVD)    DES to LAD D1 - 2003 in [place]; failed intervention for a totally obstructed RCA at that time; EF of 45%; 12/2009: Equivocal stress nuclear with good exercise tolerance, negative stress EKG, normal EF with mild mid and distal inferior ischemia  . Coronary artery disease   . Diabetes mellitus, type II ([place])   . Hyperlipidemia    Lipid profile in 09/2011:128, 130, 33, 69  . Hypertension    Lab  09/2011: Normal CMet ex G-133  . Tobacco abuse, in remission    30 pack years; quit in 1985    Current Outpatient Medications:  .  ACCU-CHEK FASTCLIX LANCETS MISC, Use up to three times daily Dx e11.22, Disp: 300 each, Rfl: 1 .  aspirin 81 MG tablet, Take 81 mg by mouth daily.  , Disp: , Rfl:  .  atorvastatin (LIPITOR) 40 MG tablet, Take 1 tablet (40 mg total) by mouth daily., Disp: 90 tablet, Rfl: 3 .  Blood Glucose Monitoring Suppl (ACCU-CHEK NANO SMARTVIEW) w/Device KIT, TEST 1 TO 2 TIMES DAILY, Disp: 1 kit, Rfl: 0 .  glipiZIDE (GLUCOTROL) 10 MG tablet, TAKE 1 TABLET TWICE A DAY BEFORE MEALS, Disp: 180 tablet, Rfl: 1 .  glucose blood (ACCU-CHEK SMARTVIEW) test strip, Use as instructed three times daily DX e11.9, Disp: 300 each, Rfl: 1 .  Insulin Detemir (LEVEMIR) 100 UNIT/ML Pen, 80 units once daily, Disp: 45 mL, Rfl: 5 .  Insulin Pen Needle (B-D ULTRAFINE III  SHORT PEN) 31G X 8 MM MISC, USE AS DIRECTED WITH LEVEMIR FLEXPEN daily, Disp: 100 each, Rfl: 5 .  meloxicam (MOBIC) 7.5 MG tablet, Take 1 tablet (7.5 mg total) by mouth daily., Disp: 21 tablet, Rfl: 0 .  metoprolol succinate (TOPROL-XL) 50 MG 24 hr tablet, TAKE 1 TABLET DAILY WITH OR IMMEDIATELY AFTER A MEAL, Disp: 90 tablet, Rfl: 1 .  olmesartan (BENICAR) 20 MG tablet, TAKE 1 TABLET DAILY (DOSE REDUCTION), Disp: 90 tablet, Rfl: 1 .  Potassium 99 MG TABS, Take 1 tablet by mouth daily., Disp: , Rfl:  .  tadalafil (CIALIS) 5 MG tablet, TAKE 1 TABLET (5 MG TOTAL) BY MOUTH DAILY AS NEEDED., Disp: 30 tablet, Rfl: 1  Social History   Tobacco Use  Smoking Status Former Smoker  . Packs/day: 1.00  . Years: 30.00  . Pack years: 30.00  . Types: Cigarettes  . Start date: 04/03/1966  . Last attempt to quit: 09/02/1983  . Years since quitting: 34.3  Smokeless Tobacco Never Used    Allergies  Allergen Reactions  . Rosuvastatin Other (See Comments)    Adverse GI symptoms   Objective:  There were no vitals filed for this visit. There is no height or weight on file to calculate BMI. Constitutional Well developed. Well nourished.  Vascular Dorsalis pedis pulses present 1+  bilaterally  Posterior tibial pulses absent bilaterally  Pedal hair growth diminished. Capillary refill normal to all digits.  No cyanosis or clubbing noted. Thin shiny skin bilat.  Neurologic Normal speech. Oriented to person, place, and time. Epicritic sensation to light touch grossly present bilaterally. Protective sensation with 5.07 monofilament  present bilaterally. Vibratory sensation present bilaterally.  Dermatologic Nails elongated, thickened, dystrophic. No open wounds. No skin lesions. Punctate keratoses 5th mpj and mid arch bilat.  Orthopedic: Normal joint ROM without pain or crepitus bilaterally. No visible deformities. No bony tenderness. POP about medial ankle tendons left.   Assessment:   1. Diabetes  mellitus type 2 with peripheral artery disease ([place])   2. Onychomycosis   3. Porokeratosis   4. Tendonitis of ankle   5. Venous (peripheral) insufficiency   6. Venous stasis    Plan:  Patient was evaluated and treated and all questions answered.  Diabetes with PAD, Onychomycosis -Educated on diabetic footcare. Diabetic risk level 1 -Nails x10 debrided sharply and manually with large nail nipper and rotary burr.   Procedure: Nail Debridement Rationale: Patient meets criteria for routine foot care due to PAD Type of Debridement: manual, sharp debridement. Instrumentation: Nail nipper, rotary burr. Number of Nails: 10   Procedure: Paring of Lesion Rationale: painful hyperkeratotic lesion Type of Debridement: manual, sharp debridement. Instrumentation: 15 blade Number of Lesions: 4   Venous Insufficicency -Recommend compression socks bilat.  Ankle Tendonitis -Likely transient. -Will further eval should it persist. -Continue use of ankle brace.  Return in about 3 months (around 03/14/2018) for Diabetic Foot Care.

## 2017-12-26 DIAGNOSIS — Z794 Long term (current) use of insulin: Secondary | ICD-10-CM | POA: Diagnosis not present

## 2017-12-26 DIAGNOSIS — E119 Type 2 diabetes mellitus without complications: Secondary | ICD-10-CM | POA: Diagnosis not present

## 2017-12-27 LAB — HEMOGLOBIN A1C
Hgb A1c MFr Bld: 8.6 % of total Hgb — ABNORMAL HIGH (ref <5.7)
Mean Plasma Glucose: 200 (calc)
eAG (mmol/L): 11.1 (calc)

## 2017-12-31 ENCOUNTER — Ambulatory Visit (INDEPENDENT_AMBULATORY_CARE_PROVIDER_SITE_OTHER): Payer: Medicare HMO

## 2017-12-31 DIAGNOSIS — Z23 Encounter for immunization: Secondary | ICD-10-CM | POA: Diagnosis not present

## 2018-01-16 ENCOUNTER — Other Ambulatory Visit: Payer: Self-pay | Admitting: Family Medicine

## 2018-01-16 NOTE — Progress Notes (Signed)
relion sent in place of levemir due to cost

## 2018-01-22 ENCOUNTER — Other Ambulatory Visit: Payer: Self-pay

## 2018-01-22 ENCOUNTER — Telehealth: Payer: Self-pay

## 2018-01-22 DIAGNOSIS — E119 Type 2 diabetes mellitus without complications: Principal | ICD-10-CM

## 2018-01-22 DIAGNOSIS — Z794 Long term (current) use of insulin: Principal | ICD-10-CM

## 2018-01-22 DIAGNOSIS — IMO0001 Reserved for inherently not codable concepts without codable children: Secondary | ICD-10-CM

## 2018-01-22 MED ORDER — "INSULIN SYRINGE 30G X 5/16"" 0.5 ML MISC": 3 refills | Status: DC

## 2018-01-22 MED ORDER — INSULIN ISOPHANE & REGULAR (HUMAN 70-30)100 UNIT/ML KWIKPEN: 35.0000 [IU] | PEN_INJECTOR | Freq: Two times a day (BID) | SUBCUTANEOUS | 2 refills | Status: DC

## 2018-01-22 MED ORDER — INSULIN ASPART PROT & ASPART (70-30 MIX) 100 UNIT/ML ~~LOC~~ SUSP: 35.0000 [IU] | Freq: Two times a day (BID) | SUBCUTANEOUS | 11 refills | Status: DC

## 2018-01-22 NOTE — Telephone Encounter (Signed)
-----   Message from Fayrene Helper, MD sent at 01/16/2018  5:15 PM EDT ----- Please let him know that he can take in it;'s place Relion two times daily, I am sending int the prescription, dose will be r 35 units twice daily,if [facility] is not on file for his meds I am asking that you print and fax the script after you speak with him /? pls ask! ----- Message ----- From: Eual Fines, LPN Sent: 89/16/9450   4:37 PM EDT To: Fayrene Helper, MD  He was taking 75 units and he cannot afford it much longer. Wants to know if he could change to any of the relion brand insulin at [facility] that costs $25

## 2018-01-24 ENCOUNTER — Other Ambulatory Visit: Payer: Self-pay

## 2018-02-21 ENCOUNTER — Encounter: Payer: Self-pay | Admitting: Family Medicine

## 2018-02-26 DIAGNOSIS — Z794 Long term (current) use of insulin: Secondary | ICD-10-CM | POA: Diagnosis not present

## 2018-02-26 DIAGNOSIS — E119 Type 2 diabetes mellitus without complications: Secondary | ICD-10-CM | POA: Diagnosis not present

## 2018-02-27 LAB — MICROALBUMIN / CREATININE URINE RATIO
Creatinine, Urine: 458 mg/dL — ABNORMAL HIGH (ref 20–320)
Microalb Creat Ratio: 17 mcg/mg creat (ref <30)
Microalb, Ur: 8 mg/dL

## 2018-03-05 ENCOUNTER — Encounter: Payer: Self-pay | Admitting: Family Medicine

## 2018-03-05 ENCOUNTER — Ambulatory Visit (INDEPENDENT_AMBULATORY_CARE_PROVIDER_SITE_OTHER): Payer: Medicare HMO | Admitting: Family Medicine

## 2018-03-05 VITALS — BP 110/80 | HR 89 | Resp 15 | Ht 69.0 in | Wt 223.1 lb

## 2018-03-05 DIAGNOSIS — I129 Hypertensive chronic kidney disease with stage 1 through stage 4 chronic kidney disease, or unspecified chronic kidney disease: Secondary | ICD-10-CM

## 2018-03-05 DIAGNOSIS — E669 Obesity, unspecified: Secondary | ICD-10-CM

## 2018-03-05 DIAGNOSIS — I2510 Atherosclerotic heart disease of native coronary artery without angina pectoris: Secondary | ICD-10-CM | POA: Diagnosis not present

## 2018-03-05 DIAGNOSIS — R1310 Dysphagia, unspecified: Secondary | ICD-10-CM | POA: Diagnosis not present

## 2018-03-05 DIAGNOSIS — N182 Chronic kidney disease, stage 2 (mild): Secondary | ICD-10-CM | POA: Diagnosis not present

## 2018-03-05 DIAGNOSIS — E785 Hyperlipidemia, unspecified: Secondary | ICD-10-CM

## 2018-03-05 DIAGNOSIS — E1122 Type 2 diabetes mellitus with diabetic chronic kidney disease: Secondary | ICD-10-CM

## 2018-03-05 DIAGNOSIS — E1159 Type 2 diabetes mellitus with other circulatory complications: Secondary | ICD-10-CM | POA: Diagnosis not present

## 2018-03-05 DIAGNOSIS — R1319 Other dysphagia: Secondary | ICD-10-CM

## 2018-03-05 MED ORDER — OLMESARTAN MEDOXOMIL 20 MG PO TABS: ORAL_TABLET | ORAL | 0 refills | Status: DC

## 2018-03-05 MED ORDER — METOPROLOL SUCCINATE ER 50 MG PO TB24: ORAL_TABLET | ORAL | 0 refills | Status: DC

## 2018-03-05 MED ORDER — GLIPIZIDE 10 MG PO TABS: ORAL_TABLET | ORAL | 0 refills | Status: DC

## 2018-03-05 NOTE — Assessment & Plan Note (Signed)
Unchanged Patient re-educated about  the importance of commitment to a  minimum of 150 minutes of exercise per week.  The importance of healthy food choices with portion control discussed. Encouraged to start a food diary, count calories and to consider  joining a support group. Sample diet sheets offered. Goals set by the patient for the next several months.   Weight /BMI 03/05/2018 11/12/2017 08/28/2017  WEIGHT 223 lb 1.9 oz 224 lb 223 lb  HEIGHT 5\' 9"  5\' 9"  5\' 9"   BMI 32.95 kg/m2 33.08 kg/m2 32.93 kg/m2

## 2018-03-05 NOTE — Assessment & Plan Note (Signed)
Uncontrolled and not testing regularly, re educated, Updated lab needed at year end Robert [name] is reminded of the importance of commitment to daily physical activity for 30 minutes or more, as able and the need to limit carbohydrate intake to 30 to 60 grams per meal to help with blood sugar control.   The need to take medication as prescribed, test blood sugar as directed, and to call between visits if there is a concern that blood sugar is uncontrolled is also discussed.   Robert [name] is reminded of the importance of daily foot exam, annual eye examination, and good blood sugar, blood pressure and cholesterol control.  Diabetic Labs Latest Ref Rng & Units 02/26/2018 12/26/2017 11/21/2017 08/28/2017 08/21/2017  HbA1c <5.7 % of total Hgb - 8.6(H) - - 8.0(H)  Microalbumin mg/dL 8.0 - - 31.3(H) -  Micro/Creat Ratio <30 mcg/mg creat 17 - - 18.7 -  Chol <200 mg/dL - - 138 - -  HDL >40 mg/dL - - 35(L) - -  Calc LDL mg/dL (calc) - - 71 - -  Triglycerides <150 mg/dL - - 231(H) - -  Creatinine 0.70 - 1.25 mg/dL - - 1.39(H) - 1.28(H)   BP/Weight 03/05/2018 11/12/2017 08/28/2017 07/26/2017 04/20/2017 04/16/2017 96/22/2979  Systolic BP 892 119 [phone] 563  Diastolic BP 80 80 80 74 69 82 74  Wt. (Lbs) 223.12 224 223 223 227 227 228  BMI 32.95 33.08 32.93 32.93 33.52 33.52 33.67   Foot/eye exam completion dates Latest Ref Rng & Units 08/28/2017 05/29/2017  Eye Exam No Retinopathy - No Retinopathy  Foot exam Order - - -  Foot Form Completion - Done -

## 2018-03-05 NOTE — Assessment & Plan Note (Signed)
Hyperlipidemia:Low fat diet discussed and encouraged.   Lipid Panel  Lab Results  Component Value Date   CHOL 138 11/21/2017   HDL 35 (L) 11/21/2017   LDLCALC 71 11/21/2017   TRIG 231 (H) 11/21/2017   CHOLHDL 3.9 11/21/2017    Uncontrolled, updated lab by year end

## 2018-03-05 NOTE — Assessment & Plan Note (Signed)
Controlled, no change in medication DASH diet and commitment to daily physical activity for a minimum of 30 minutes discussed and encouraged, as a part of hypertension management. The importance of attaining a healthy weight is also discussed.  BP/Weight 03/05/2018 11/12/2017 08/28/2017 07/26/2017 04/20/2017 04/16/2017 61/95/0932  Systolic BP 671 245 809 983 382 505 397  Diastolic BP 80 80 80 74 69 82 74  Wt. (Lbs) 223.12 224 223 223 227 227 228  BMI 32.95 33.08 32.93 32.93 33.52 33.52 33.67

## 2018-03-05 NOTE — Progress Notes (Signed)
JULE SCHLABACH     MRN: [id number]      DOB: [birth year]   HPI Mr. Semper is here for follow up and re-evaluation of chronic medical conditions, medication management and review of any available recent lab and radiology data.  Preventive health is updated, specifically  Cancer screening and Immunization.   Questions or concerns regarding consultations or procedures which the PT has had in the interim are  addressed. The PT denies any adverse reactions to current medications since the last visit.  Solid dysphagia intermittently twice this year , no reflux symotoms, started last year   ROS Denies recent fever or chills. Denies sinus pressure, nasal congestion, ear pain or sore throat. Denies chest congestion, productive cough or wheezing. Denies chest pains, palpitations and leg swelling Denies abdominal pain, nausea, vomiting,diarrhea or constipation.   Denies dysuria, frequency, hesitancy or incontinence. Denies joint pain, swelling and limitation in mobility. Denies headaches, seizures, numbness, or tingling. Denies depression, anxiety or insomnia. Denies skin break down or rash.   PE  BP 110/80   Pulse 89   Resp 15   Ht 5\' 9"  (1.753 m)   Wt 223 lb 1.9 oz (101.2 kg)   SpO2 95%   BMI 32.95 kg/m   Patient alert and oriented and in no cardiopulmonary distress.  HEENT: No facial asymmetry, EOMI,   oropharynx pink and moist.  Neck supple no JVD, no mass.  Chest: Clear to auscultation bilaterally.  CVS: S1, S2 no murmurs, no S3.Regular rate.  ABD: Soft non tender.   Ext: No edema  MS: Adequate ROM spine, shoulders, hips and knees.  Skin: Intact, no ulcerations or rash noted.  Psych: Good eye contact, normal affect. Memory intact not anxious or depressed appearing.  CNS: CN 2-12 intact, power,  normal throughout.no focal deficits noted.   Assessment & Plan  Hypertension associated with stage 2 chronic kidney disease due to type 2 diabetes mellitus Controlled, no  change in medication DASH diet and commitment to daily physical activity for a minimum of 30 minutes discussed and encouraged, as a part of hypertension management. The importance of attaining a healthy weight is also discussed.  BP/Weight 03/05/2018 11/12/2017 08/28/2017 07/26/2017 04/20/2017 04/16/2017 89/38/1017  Systolic BP 510 258 527 782 423 536 144  Diastolic BP 80 80 80 74 69 82 74  Wt. (Lbs) 223.12 224 223 223 227 227 228  BMI 32.95 33.08 32.93 32.93 33.52 33.52 33.67       Obesity (BMI 30.0-34.9) Unchanged Patient re-educated about  the importance of commitment to a  minimum of 150 minutes of exercise per week.  The importance of healthy food choices with portion control discussed. Encouraged to start a food diary, count calories and to consider  joining a support group. Sample diet sheets offered. Goals set by the patient for the next several months.   Weight /BMI 03/05/2018 11/12/2017 08/28/2017  WEIGHT 223 lb 1.9 oz 224 lb 223 lb  HEIGHT 5\' 9"  5\' 9"  5\' 9"   BMI 32.95 kg/m2 33.08 kg/m2 32.93 kg/m2      Hyperlipidemia LDL goal <70 Hyperlipidemia:Low fat diet discussed and encouraged.   Lipid Panel  Lab Results  Component Value Date   CHOL 138 11/21/2017   HDL 35 (L) 11/21/2017   LDLCALC 71 11/21/2017   TRIG 231 (H) 11/21/2017   CHOLHDL 3.9 11/21/2017    Uncontrolled, updated lab by year end   Type 2 diabetes mellitus with vascular disease ([place]) Uncontrolled and not testing regularly, re  educated, Updated lab needed at year end Mr. Herder is reminded of the importance of commitment to daily physical activity for 30 minutes or more, as able and the need to limit carbohydrate intake to 30 to 60 grams per meal to help with blood sugar control.   The need to take medication as prescribed, test blood sugar as directed, and to call between visits if there is a concern that blood sugar is uncontrolled is also discussed.   Mr. Hudock is reminded of the importance of daily  foot exam, annual eye examination, and good blood sugar, blood pressure and cholesterol control.  Diabetic Labs Latest Ref Rng & Units 02/26/2018 12/26/2017 11/21/2017 08/28/2017 08/21/2017  HbA1c <5.7 % of total Hgb - 8.6(H) - - 8.0(H)  Microalbumin mg/dL 8.0 - - 31.3(H) -  Micro/Creat Ratio <30 mcg/mg creat 17 - - 18.7 -  Chol <200 mg/dL - - 138 - -  HDL >40 mg/dL - - 35(L) - -  Calc LDL mg/dL (calc) - - 71 - -  Triglycerides <150 mg/dL - - 231(H) - -  Creatinine 0.70 - 1.25 mg/dL - - 1.39(H) - 1.28(H)   BP/Weight 03/05/2018 11/12/2017 08/28/2017 07/26/2017 04/20/2017 04/16/2017 67/73/7366  Systolic BP 815 947 076 151 834 373 578  Diastolic BP 80 80 80 74 69 82 74  Wt. (Lbs) 223.12 224 223 223 227 227 228  BMI 32.95 33.08 32.93 32.93 33.52 33.52 33.67   Foot/eye exam completion dates Latest Ref Rng & Units 08/28/2017 05/29/2017  Eye Exam No Retinopathy - No Retinopathy  Foot exam Order - - -  Foot Form Completion - Done -        Dysphagia One year h/o intermittent solid dysphagia, infrequent but severe when occurs, refer to GI,  Denies reflux symptoms, no known trigger

## 2018-03-05 NOTE — Assessment & Plan Note (Signed)
One year h/o intermittent solid dysphagia, infrequent but severe when occurs, refer to GI,  Denies reflux symptoms, no known trigger

## 2018-03-05 NOTE — Patient Instructions (Signed)
F/U in first week in April, call if you need me before  Please test blood sugar at least ionce every day and try y to follow healthy food choice   Walking, swimming, cycling are all great'less TV   Increase community activity, be deliberate about this   CBC, iron, fasting lipid, cmp and, HBA1C and iron end December I will send my chart messagwe. HBA1C max is 8.0, goal is 7.0 or less  Three stool cards take home today and return by next week please

## 2018-03-14 ENCOUNTER — Ambulatory Visit: Payer: Medicare HMO | Admitting: Podiatry

## 2018-03-14 DIAGNOSIS — E1169 Type 2 diabetes mellitus with other specified complication: Secondary | ICD-10-CM | POA: Diagnosis not present

## 2018-03-14 DIAGNOSIS — E1151 Type 2 diabetes mellitus with diabetic peripheral angiopathy without gangrene: Secondary | ICD-10-CM

## 2018-03-14 DIAGNOSIS — B351 Tinea unguium: Secondary | ICD-10-CM

## 2018-03-19 ENCOUNTER — Telehealth (INDEPENDENT_AMBULATORY_CARE_PROVIDER_SITE_OTHER): Payer: Medicare HMO

## 2018-03-19 DIAGNOSIS — Z1211 Encounter for screening for malignant neoplasm of colon: Secondary | ICD-10-CM

## 2018-03-19 LAB — HEMOCCULT GUIAC POC 1CARD (OFFICE)
Card #2 Fecal Occult Blod, POC: NEGATIVE
Card #3 Fecal Occult Blood, POC: NEGATIVE
Fecal Occult Blood, POC: NEGATIVE

## 2018-03-19 NOTE — Telephone Encounter (Signed)
Stool card order entered.

## 2018-03-20 ENCOUNTER — Encounter (INDEPENDENT_AMBULATORY_CARE_PROVIDER_SITE_OTHER): Payer: Self-pay | Admitting: Internal Medicine

## 2018-03-20 ENCOUNTER — Encounter (INDEPENDENT_AMBULATORY_CARE_PROVIDER_SITE_OTHER): Payer: Self-pay | Admitting: *Deleted

## 2018-03-20 ENCOUNTER — Ambulatory Visit (INDEPENDENT_AMBULATORY_CARE_PROVIDER_SITE_OTHER): Payer: Medicare HMO | Admitting: Internal Medicine

## 2018-03-20 VITALS — BP 154/80 | HR 76 | Temp 98.0°F | Ht 69.0 in | Wt 223.7 lb

## 2018-03-20 DIAGNOSIS — R1319 Other dysphagia: Secondary | ICD-10-CM

## 2018-03-20 DIAGNOSIS — R1310 Dysphagia, unspecified: Secondary | ICD-10-CM

## 2018-03-20 NOTE — Patient Instructions (Signed)
The risks of bleeding, perforation and infection were reviewed with patient.  

## 2018-03-20 NOTE — Progress Notes (Signed)
Subjective:    Patient ID: Robert [name], male    DOB: 1950/08/11, 67 y.o.   MRN: [id number]  HPI Referred by Dr Moshe Cipro for dysphagia. When he first starts eating, he is having dysphagia. Feels like foods are lodging. He has to get up and walk around. Occurs once every 2-3 weeks. A few years ago he had similar episodes.  Appetite is okay. No weight loss. Has a BM x 1-2 a day. No melena or BRRB. All 3 stool samples were negative 03/19/2018.  Last colonoscopy in 2016 by Dr .Laural Golden    Review of Systems Past Medical History:  Diagnosis Date  . Arteriosclerotic cardiovascular disease (ASCVD)    DES to LAD D1 - 2003 in [place]; failed intervention for a totally obstructed RCA at that time; EF of 45%; 12/2009: Equivocal stress nuclear with good exercise tolerance, negative stress EKG, normal EF with mild mid and distal inferior ischemia  . Coronary artery disease   . Diabetes mellitus, insulin dependent (IDDM), controlled ([place]) 10/25/2012   HBA1C is 6.9 on 10/22/2012   . Diabetes mellitus, type II ([place])   . Hyperlipidemia    Lipid profile in 09/2011:128, 130, 33, 69  . Hypertension    Lab  09/2011: Normal CMet ex G-133  . Tobacco abuse, in remission    30 pack years; quit in 1985    Past Surgical History:  Procedure Laterality Date  . CARDIAC CATHETERIZATION    . COLONOSCOPY  2010   Negative screening study  . COLONOSCOPY N/A 11/11/2014   Procedure: COLONOSCOPY;  Surgeon: Rogene Houston, MD;  Location: [facility];  Service: Endoscopy;  Laterality: N/A;  1030  . EPIDIDYMIS SURGERY  1996  . KNEE ARTHROSCOPY W/ MENISCECTOMY  11/2008   Right  . PRESSURE ULCER DEBRIDEMENT  2006   Right lower extremity  . ROTATOR CUFF REPAIR  07/2009   Right    Allergies  Allergen Reactions  . Rosuvastatin Other (See Comments)    Adverse GI symptoms    Current Outpatient Medications on File Prior to Visit  Medication Sig Dispense Refill  . ACCU-CHEK FASTCLIX LANCETS MISC Use up to three times  daily Dx e11.22 300 each 1  . aspirin 81 MG tablet Take 81 mg by mouth daily.      Marland Kitchen atorvastatin (LIPITOR) 40 MG tablet Take 1 tablet (40 mg total) by mouth daily. 90 tablet 3  . Blood Glucose Monitoring Suppl (ACCU-CHEK NANO SMARTVIEW) w/Device KIT TEST 1 TO 2 TIMES DAILY 1 kit 0  . glipiZIDE (GLUCOTROL) 10 MG tablet TAKE 1 TABLET TWICE A DAY BEFORE MEALS 60 tablet 0  . glucose blood (ACCU-CHEK SMARTVIEW) test strip Use as instructed three times daily DX e11.9 300 each 1  . Insulin Detemir (LEVEMIR) 100 UNIT/ML Pen 80 units once daily (Patient taking differently: 85 Units. 80 units once daily) 45 mL 5  . metoprolol succinate (TOPROL-XL) 50 MG 24 hr tablet TAKE 1 TABLET DAILY WITH OR IMMEDIATELY AFTER A MEAL 30 tablet 0  . olmesartan (BENICAR) 20 MG tablet TAKE 1 TABLET DAILY (DOSE REDUCTION) 30 tablet 0  . Insulin Isophane & Regular Human (NOVOLIN 70/30 FLEXPEN RELION) (70-30) 100 UNIT/ML PEN Inject 35 Units into the skin 2 (two) times daily. Patient to get relion brand vial (Patient not taking: Reported on 03/20/2018) 15 mL 2  . Insulin Pen Needle (B-D ULTRAFINE III SHORT PEN) 31G X 8 MM MISC USE AS DIRECTED WITH LEVEMIR FLEXPEN daily 100 each 5  .  Insulin Syringe-Needle U-100 (INSULIN SYRINGE .5CC/30GX5/16") 30G X 5/16" 0.5 ML MISC Use as directed with novolog 70/30 100 each 3   No current facility-administered medications on file prior to visit.         Objective:   Physical Exam Blood pressure (!) 154/80, pulse 76, temperature 98 F (36.7 C), height '5\' 9"'  (1.753 m), weight 223 lb 11.2 oz (101.5 kg). Alert and oriented. Skin warm and dry. Oral mucosa is moist.   . Sclera anicteric, conjunctivae is pink. Thyroid not enlarged. No cervical lymphadenopathy. Lungs clear. Heart regular rate and rhythm.  Abdomen is soft. Bowel sounds are positive. No hepatomegaly. No abdominal masses felt. No tenderness.  No edema to lower extremities.            Assessment & Plan:  Dysphagia. Am going to  get an Esophagram. Further recommendations to follow.

## 2018-03-22 ENCOUNTER — Ambulatory Visit (HOSPITAL_COMMUNITY)
Admission: RE | Admit: 2018-03-22 | Discharge: 2018-03-22 | Disposition: A | Discharge status: Home / Self Care | Payer: Medicare HMO | Source: Ambulatory Visit | Attending: Internal Medicine | Admitting: Internal Medicine

## 2018-03-22 DIAGNOSIS — R1310 Dysphagia, unspecified: Secondary | ICD-10-CM | POA: Insufficient documentation

## 2018-03-22 DIAGNOSIS — R1319 Other dysphagia: Secondary | ICD-10-CM

## 2018-03-22 DIAGNOSIS — T17308A Unspecified foreign body in larynx causing other injury, initial encounter: Secondary | ICD-10-CM | POA: Diagnosis not present

## 2018-04-02 DIAGNOSIS — E1122 Type 2 diabetes mellitus with diabetic chronic kidney disease: Secondary | ICD-10-CM | POA: Diagnosis not present

## 2018-04-02 DIAGNOSIS — E785 Hyperlipidemia, unspecified: Secondary | ICD-10-CM | POA: Diagnosis not present

## 2018-04-02 DIAGNOSIS — Z794 Long term (current) use of insulin: Secondary | ICD-10-CM | POA: Diagnosis not present

## 2018-04-02 DIAGNOSIS — I129 Hypertensive chronic kidney disease with stage 1 through stage 4 chronic kidney disease, or unspecified chronic kidney disease: Secondary | ICD-10-CM | POA: Diagnosis not present

## 2018-04-02 DIAGNOSIS — N182 Chronic kidney disease, stage 2 (mild): Secondary | ICD-10-CM | POA: Diagnosis not present

## 2018-04-02 DIAGNOSIS — E119 Type 2 diabetes mellitus without complications: Secondary | ICD-10-CM | POA: Diagnosis not present

## 2018-04-02 DIAGNOSIS — I2510 Atherosclerotic heart disease of native coronary artery without angina pectoris: Secondary | ICD-10-CM | POA: Diagnosis not present

## 2018-04-03 LAB — CBC
HCT: 41 % (ref 38.5–50.0)
Hemoglobin: 13.2 g/dL (ref 13.2–17.1)
MCH: 27.7 pg (ref 27.0–33.0)
MCHC: 32.2 g/dL (ref 32.0–36.0)
MCV: 86.1 fL (ref 80.0–100.0)
MPV: 10.6 fL (ref 7.5–12.5)
PLATELETS: 268 10*3/uL (ref 140–400)
RBC: 4.76 10*6/uL (ref 4.20–5.80)
RDW: 13.8 % (ref 11.0–15.0)
WBC: 5.4 10*3/uL (ref 3.8–10.8)

## 2018-04-03 LAB — LIPID PANEL
CHOLESTEROL: 116 mg/dL (ref <200)
HDL: 39 mg/dL — ABNORMAL LOW (ref >40)
LDL Cholesterol (Calc): 59 mg/dL (calc)
NON-HDL CHOLESTEROL (CALC): 77 mg/dL (ref <130)
Total CHOL/HDL Ratio: 3 (calc) (ref <5.0)
Triglycerides: 97 mg/dL (ref <150)

## 2018-04-03 LAB — COMPLETE METABOLIC PANEL WITH GFR
AG RATIO: 1.4 (calc) (ref 1.0–2.5)
ALT: 18 U/L (ref 9–46)
AST: 15 U/L (ref 10–35)
Albumin: 4.2 g/dL (ref 3.6–5.1)
Alkaline phosphatase (APISO): 138 U/L — ABNORMAL HIGH (ref 40–115)
BUN/Creatinine Ratio: 11 (calc) (ref 6–22)
BUN: 15 mg/dL (ref 7–25)
CALCIUM: 9.1 mg/dL (ref 8.6–10.3)
CO2: 25 mmol/L (ref 20–32)
Chloride: 104 mmol/L (ref 98–110)
Creat: 1.32 mg/dL — ABNORMAL HIGH (ref 0.70–1.25)
GFR, EST AFRICAN AMERICAN: 65 mL/min/{1.73_m2} (ref >=60)
GFR, EST NON AFRICAN AMERICAN: 56 mL/min/{1.73_m2} — AB (ref >=60)
GLOBULIN: 3.1 g/dL (ref 1.9–3.7)
Glucose, Bld: 102 mg/dL — ABNORMAL HIGH (ref 65–99)
POTASSIUM: 4.6 mmol/L (ref 3.5–5.3)
Sodium: 138 mmol/L (ref 135–146)
TOTAL PROTEIN: 7.3 g/dL (ref 6.1–8.1)
Total Bilirubin: 0.4 mg/dL (ref 0.2–1.2)

## 2018-04-03 LAB — IRON: Iron: 37 ug/dL — ABNORMAL LOW (ref 50–180)

## 2018-04-03 LAB — HEMOGLOBIN A1C
Hgb A1c MFr Bld: 8.1 % of total Hgb — ABNORMAL HIGH (ref <5.7)
Mean Plasma Glucose: 186 (calc)
eAG (mmol/L): 10.3 (calc)

## 2018-04-05 ENCOUNTER — Encounter: Payer: Self-pay | Admitting: Family Medicine

## 2018-04-08 ENCOUNTER — Telehealth: Payer: Self-pay

## 2018-04-08 ENCOUNTER — Other Ambulatory Visit: Payer: Self-pay

## 2018-04-08 DIAGNOSIS — I2510 Atherosclerotic heart disease of native coronary artery without angina pectoris: Secondary | ICD-10-CM

## 2018-04-08 DIAGNOSIS — IMO0001 Reserved for inherently not codable concepts without codable children: Secondary | ICD-10-CM

## 2018-04-08 DIAGNOSIS — E119 Type 2 diabetes mellitus without complications: Principal | ICD-10-CM

## 2018-04-08 DIAGNOSIS — Z794 Long term (current) use of insulin: Principal | ICD-10-CM

## 2018-04-08 DIAGNOSIS — E1159 Type 2 diabetes mellitus with other circulatory complications: Secondary | ICD-10-CM

## 2018-04-08 MED ORDER — INSULIN PEN NEEDLE 31G X 8 MM MISC: 5 refills | Status: DC

## 2018-04-08 MED ORDER — INSULIN ISOPHANE & REGULAR (HUMAN 70-30)100 UNIT/ML KWIKPEN: 35.0000 [IU] | PEN_INJECTOR | Freq: Two times a day (BID) | SUBCUTANEOUS | 2 refills | Status: DC

## 2018-04-08 MED ORDER — OLMESARTAN MEDOXOMIL 20 MG PO TABS: ORAL_TABLET | ORAL | 2 refills | Status: DC

## 2018-04-08 MED ORDER — ATORVASTATIN CALCIUM 40 MG PO TABS: 40.0000 mg | ORAL_TABLET | Freq: Every day | ORAL | 2 refills | Status: DC

## 2018-04-08 MED ORDER — METOPROLOL SUCCINATE ER 50 MG PO TB24: ORAL_TABLET | ORAL | 2 refills | Status: DC

## 2018-04-08 MED ORDER — GLIPIZIDE 10 MG PO TABS: ORAL_TABLET | ORAL | 0 refills | Status: DC

## 2018-04-08 NOTE — Telephone Encounter (Signed)
-----  Message from Fayrene Helper, MD sent at 04/05/2018 12:19 PM EST ----- Congratulations on improvement of all of your labs Blood sugar avg is down from 8.6 to 8.1, which is great, next 3 months, you will be under 8 which is the goal. Kidney function and liver function are normal. Non fasting lab needed 1 week before your follow up appt  In April and this is mailed (HBA1C , cmpand EGFR)

## 2018-04-09 ENCOUNTER — Ambulatory Visit: Payer: Medicare Other | Admitting: Orthotics

## 2018-04-09 DIAGNOSIS — M722 Plantar fascial fibromatosis: Secondary | ICD-10-CM

## 2018-04-09 DIAGNOSIS — M205X1 Other deformities of toe(s) (acquired), right foot: Secondary | ICD-10-CM

## 2018-04-09 DIAGNOSIS — Q828 Other specified congenital malformations of skin: Secondary | ICD-10-CM

## 2018-04-09 DIAGNOSIS — E1151 Type 2 diabetes mellitus with diabetic peripheral angiopathy without gangrene: Secondary | ICD-10-CM

## 2018-04-09 DIAGNOSIS — M2041 Other hammer toe(s) (acquired), right foot: Secondary | ICD-10-CM

## 2018-04-09 DIAGNOSIS — M2042 Other hammer toe(s) (acquired), left foot: Secondary | ICD-10-CM

## 2018-04-09 DIAGNOSIS — L84 Corns and callosities: Secondary | ICD-10-CM

## 2018-04-09 NOTE — Progress Notes (Signed)

## 2018-04-28 NOTE — Progress Notes (Signed)
Subjective:  Patient ID: Robert [name], male    DOB: [birth year],  MRN: [id number]  Chief Complaint  Patient presents with  . Nail Problem    3 month nail trim    68 y.o. male presents  for diabetic foot care. No new complaints.  Review of Systems: Negative except as noted in the HPI. Denies N/V/F/Ch.  Past Medical History:  Diagnosis Date  . Arteriosclerotic cardiovascular disease (ASCVD)    DES to LAD D1 - 2003 in [place]; failed intervention for a totally obstructed RCA at that time; EF of 45%; 12/2009: Equivocal stress nuclear with good exercise tolerance, negative stress EKG, normal EF with mild mid and distal inferior ischemia  . Coronary artery disease   . Diabetes mellitus, insulin dependent (IDDM), controlled ([place]) 10/25/2012   HBA1C is 6.9 on 10/22/2012   . Diabetes mellitus, type II ([place])   . Hyperlipidemia    Lipid profile in 09/2011:128, 130, 33, 69  . Hypertension    Lab  09/2011: Normal CMet ex G-133  . Tobacco abuse, in remission    30 pack years; quit in 1985    Current Outpatient Medications:  .  ACCU-CHEK FASTCLIX LANCETS MISC, Use up to three times daily Dx e11.22, Disp: 300 each, Rfl: 1 .  aspirin 81 MG tablet, Take 81 mg by mouth daily.  , Disp: , Rfl:  .  atorvastatin (LIPITOR) 40 MG tablet, Take 1 tablet (40 mg total) by mouth daily., Disp: 90 tablet, Rfl: 2 .  Blood Glucose Monitoring Suppl (ACCU-CHEK NANO SMARTVIEW) w/Device KIT, TEST 1 TO 2 TIMES DAILY, Disp: 1 kit, Rfl: 0 .  glipiZIDE (GLUCOTROL) 10 MG tablet, TAKE 1 TABLET TWICE A DAY BEFORE MEALS, Disp: 180 tablet, Rfl: 0 .  glucose blood (ACCU-CHEK SMARTVIEW) test strip, Use as instructed three times daily DX e11.9, Disp: 300 each, Rfl: 1 .  Insulin Detemir (LEVEMIR) 100 UNIT/ML Pen, 80 units once daily (Patient taking differently: 85 Units. 80 units once daily), Disp: 45 mL, Rfl: 5 .  Insulin Isophane & Regular Human (NOVOLIN 70/30 FLEXPEN RELION) (70-30) 100 UNIT/ML PEN, Inject 35 Units into the skin  2 (two) times daily. Patient to get relion brand vial, Disp: 45 mL, Rfl: 2 .  Insulin Pen Needle (B-D ULTRAFINE III SHORT PEN) 31G X 8 MM MISC, USE AS DIRECTED WITH LEVEMIR FLEXPEN daily, Disp: 100 each, Rfl: 5 .  Insulin Syringe-Needle U-100 (INSULIN SYRINGE .5CC/30GX5/16") 30G X 5/16" 0.5 ML MISC, Use as directed with novolog 70/30, Disp: 100 each, Rfl: 3 .  metoprolol succinate (TOPROL-XL) 50 MG 24 hr tablet, TAKE 1 TABLET DAILY WITH OR IMMEDIATELY AFTER A MEAL, Disp: 90 tablet, Rfl: 2 .  olmesartan (BENICAR) 20 MG tablet, TAKE 1 TABLET DAILY (DOSE REDUCTION), Disp: 90 tablet, Rfl: 2  Social History   Tobacco Use  Smoking Status Former Smoker  . Packs/day: 1.00  . Years: 30.00  . Pack years: 30.00  . Types: Cigarettes  . Start date: 04/03/1966  . Last attempt to quit: 09/02/1983  . Years since quitting: 34.6  Smokeless Tobacco Never Used    Allergies  Allergen Reactions  . Rosuvastatin Other (See Comments)    Adverse GI symptoms   Objective:  There were no vitals filed for this visit. There is no height or weight on file to calculate BMI. Constitutional Well developed. Well nourished.  Vascular Dorsalis pedis pulses present 1+ bilaterally  Posterior tibial pulses absent bilaterally  Pedal hair growth diminished. Capillary refill normal to  all digits.  No cyanosis or clubbing noted. Thin shiny skin bilat.  Neurologic Normal speech. Oriented to person, place, and time. Epicritic sensation to light touch grossly present bilaterally. Protective sensation with 5.07 monofilament  present bilaterally. Vibratory sensation present bilaterally.  Dermatologic Nails elongated, thickened, dystrophic. No open wounds. No skin lesions. Punctate keratoses 5th mpj and mid arch bilat.  Orthopedic: Normal joint ROM without pain or crepitus bilaterally. No visible deformities. No bony tenderness. POP about medial ankle tendons left.   Assessment:   1. Onychomycosis of multiple toenails  with type 2 diabetes mellitus and peripheral angiopathy ([place])    Plan:  Patient was evaluated and treated and all questions answered.  Diabetes with PAD, Onychomycosis -Educated on diabetic footcare. Diabetic risk level 1 -Nails x10 debrided sharply and manually with large nail nipper and rotary burr.   Procedure: Nail Debridement Rationale: Patient meets criteria for routine foot care due to PAD Type of Debridement: manual, sharp debridement. Instrumentation: Nail nipper, rotary burr. Number of Nails: 10    Return in about 3 months (around 06/13/2018) for DM footcare.

## 2018-05-10 ENCOUNTER — Ambulatory Visit: Payer: Medicare Other

## 2018-05-17 ENCOUNTER — Ambulatory Visit: Payer: Medicare Other | Admitting: Orthotics

## 2018-05-30 DIAGNOSIS — H5203 Hypermetropia, bilateral: Secondary | ICD-10-CM | POA: Diagnosis not present

## 2018-05-30 DIAGNOSIS — E119 Type 2 diabetes mellitus without complications: Secondary | ICD-10-CM | POA: Diagnosis not present

## 2018-05-30 DIAGNOSIS — H25813 Combined forms of age-related cataract, bilateral: Secondary | ICD-10-CM | POA: Diagnosis not present

## 2018-05-30 DIAGNOSIS — H52223 Regular astigmatism, bilateral: Secondary | ICD-10-CM | POA: Diagnosis not present

## 2018-05-30 LAB — HM DIABETES EYE EXAM

## 2018-06-04 ENCOUNTER — Encounter: Payer: Self-pay | Admitting: Podiatry

## 2018-06-04 ENCOUNTER — Ambulatory Visit: Payer: Medicare Other | Admitting: Podiatry

## 2018-06-04 DIAGNOSIS — M79675 Pain in left toe(s): Secondary | ICD-10-CM

## 2018-06-04 DIAGNOSIS — B351 Tinea unguium: Secondary | ICD-10-CM | POA: Diagnosis not present

## 2018-06-04 DIAGNOSIS — E1151 Type 2 diabetes mellitus with diabetic peripheral angiopathy without gangrene: Secondary | ICD-10-CM

## 2018-06-04 DIAGNOSIS — M205X2 Other deformities of toe(s) (acquired), left foot: Secondary | ICD-10-CM | POA: Diagnosis not present

## 2018-06-04 DIAGNOSIS — M2042 Other hammer toe(s) (acquired), left foot: Secondary | ICD-10-CM | POA: Diagnosis not present

## 2018-06-04 DIAGNOSIS — L84 Corns and callosities: Secondary | ICD-10-CM | POA: Diagnosis not present

## 2018-06-04 DIAGNOSIS — M79674 Pain in right toe(s): Secondary | ICD-10-CM | POA: Diagnosis not present

## 2018-06-04 DIAGNOSIS — M205X1 Other deformities of toe(s) (acquired), right foot: Secondary | ICD-10-CM | POA: Diagnosis not present

## 2018-06-04 DIAGNOSIS — M2041 Other hammer toe(s) (acquired), right foot: Secondary | ICD-10-CM | POA: Diagnosis not present

## 2018-06-04 NOTE — Patient Instructions (Signed)

## 2018-06-07 ENCOUNTER — Ambulatory Visit: Payer: Medicare Other

## 2018-06-09 NOTE — Progress Notes (Signed)
Subjective: Patient presents today with diabetes and PAD for preventative diabetic foot care. He is seen for painful, discolored, thick toenails which interfere with daily activities. Pain is aggravated when wearing enclosed shoe gear and relieved with periodic professional debridement.  Fayrene Helper, MD is his PCP. Last visit 03/05/2018.   Current Outpatient Medications:  .  ACCU-CHEK FASTCLIX LANCETS MISC, Use up to three times daily Dx e11.22, Disp: 300 each, Rfl: 1 .  aspirin 81 MG tablet, Take 81 mg by mouth daily.  , Disp: , Rfl:  .  atorvastatin (LIPITOR) 40 MG tablet, Take 1 tablet (40 mg total) by mouth daily., Disp: 90 tablet, Rfl: 2 .  Blood Glucose Monitoring Suppl (ACCU-CHEK NANO SMARTVIEW) w/Device KIT, TEST 1 TO 2 TIMES DAILY, Disp: 1 kit, Rfl: 0 .  glipiZIDE (GLUCOTROL) 10 MG tablet, TAKE 1 TABLET TWICE A DAY BEFORE MEALS, Disp: 180 tablet, Rfl: 0 .  glucose blood (ACCU-CHEK SMARTVIEW) test strip, Use as instructed three times daily DX e11.9, Disp: 300 each, Rfl: 1 .  Insulin Detemir (LEVEMIR) 100 UNIT/ML Pen, 80 units once daily (Patient taking differently: 85 Units. 80 units once daily), Disp: 45 mL, Rfl: 5 .  Insulin Isophane & Regular Human (NOVOLIN 70/30 FLEXPEN RELION) (70-30) 100 UNIT/ML PEN, Inject 35 Units into the skin 2 (two) times daily. Patient to get relion brand vial, Disp: 45 mL, Rfl: 2 .  Insulin Pen Needle (B-D ULTRAFINE III SHORT PEN) 31G X 8 MM MISC, USE AS DIRECTED WITH LEVEMIR FLEXPEN daily, Disp: 100 each, Rfl: 5 .  Insulin Syringe-Needle U-100 (INSULIN SYRINGE .5CC/30GX5/16") 30G X 5/16" 0.5 ML MISC, Use as directed with novolog 70/30, Disp: 100 each, Rfl: 3 .  metoprolol succinate (TOPROL-XL) 50 MG 24 hr tablet, TAKE 1 TABLET DAILY WITH OR IMMEDIATELY AFTER A MEAL, Disp: 90 tablet, Rfl: 2 .  olmesartan (BENICAR) 20 MG tablet, TAKE 1 TABLET DAILY (DOSE REDUCTION), Disp: 90 tablet, Rfl: 2   Allergies  Allergen Reactions  . Rosuvastatin Other (See  Comments)    Adverse GI symptoms     Objective:  Vascular Examination: Capillary refill time immediate  x 10 digits  Dorsalis pedis pulses 1/4  B/l  Posterior tibial pulses absent b/l  No digital hair x 10 digits  Skin temperature gradient WNL b/l.  Dermatological Examination: Skin thin, shiny and atrophic b/l  Toenails 1-5 b/l discolored, thick, dystrophic with subungual debris and pain with palpation to nailbeds due to thickness of nails.  Porokeratotic lesions submet head 5 b/l and mid arch b/l. No erythema, no edema, no drainage, no flocculence.  Musculoskeletal: Muscle strength 5/5 to all LE muscle groups  Neurological: Sensation intact with 10 gram monofilament.  Vibratory sensation intact.  Assessment: 1. Painful onychomycosis toenails 1-5 b/l 2. NIDDM with Peripheral arterial disease  Plan: 1. Toenails 1-5 b/l were debrided in length and girth without iatrogenic bleeding. Porokeratoses pared submetatarsal head(s) 5 b/l and mid arch b/l 2. Patient to continue soft, supportive shoe gear. 3. Patient to report any pedal injuries to medical professional  4. Follow up 10 weeks.  5. Patient/POA to call should there be a concern in the interim.

## 2018-06-10 ENCOUNTER — Other Ambulatory Visit: Payer: Self-pay

## 2018-06-10 ENCOUNTER — Telehealth: Payer: Self-pay | Admitting: *Deleted

## 2018-06-10 DIAGNOSIS — Z794 Long term (current) use of insulin: Principal | ICD-10-CM

## 2018-06-10 DIAGNOSIS — E119 Type 2 diabetes mellitus without complications: Principal | ICD-10-CM

## 2018-06-10 DIAGNOSIS — IMO0001 Reserved for inherently not codable concepts without codable children: Secondary | ICD-10-CM

## 2018-06-10 MED ORDER — METOPROLOL SUCCINATE ER 50 MG PO TB24: ORAL_TABLET | ORAL | 2 refills | Status: DC

## 2018-06-10 MED ORDER — OLMESARTAN MEDOXOMIL 20 MG PO TABS: ORAL_TABLET | ORAL | 2 refills | Status: DC

## 2018-06-10 MED ORDER — GLIPIZIDE 10 MG PO TABS: ORAL_TABLET | ORAL | 0 refills | Status: DC

## 2018-06-10 MED ORDER — INSULIN PEN NEEDLE 31G X 8 MM MISC: 5 refills | Status: DC

## 2018-06-10 MED ORDER — ATORVASTATIN CALCIUM 40 MG PO TABS: 40.0000 mg | ORAL_TABLET | Freq: Every day | ORAL | 2 refills | Status: DC

## 2018-06-10 MED ORDER — INSULIN DETEMIR 100 UNIT/ML FLEXPEN: PEN_INJECTOR | SUBCUTANEOUS | 5 refills | Status: DC

## 2018-06-10 NOTE — Telephone Encounter (Signed)
All meds refilled and sent to [facility]

## 2018-06-10 NOTE — Telephone Encounter (Signed)
Pt called in stating he was trying to set up his medicine delivery through [facility] through [facility]. He brought some paperwork by around two weeks ago but he called today because they had never been sent in. They are Glipizide 10 mg atorvastatin 40 mg meltoprolol 50 mg olmesartan 20 mg levemir 100 unit insulin pen and  the insulin pen needles. Their fax number is [phone].

## 2018-07-04 DIAGNOSIS — I2510 Atherosclerotic heart disease of native coronary artery without angina pectoris: Secondary | ICD-10-CM | POA: Diagnosis not present

## 2018-07-04 DIAGNOSIS — E1159 Type 2 diabetes mellitus with other circulatory complications: Secondary | ICD-10-CM | POA: Diagnosis not present

## 2018-07-05 LAB — COMPLETE METABOLIC PANEL WITH GFR
AG Ratio: 1.3 (calc) (ref 1.0–2.5)
ALT: 20 U/L (ref 9–46)
AST: 20 U/L (ref 10–35)
Albumin: 4.1 g/dL (ref 3.6–5.1)
Alkaline phosphatase (APISO): 120 U/L (ref 35–144)
BUN/Creatinine Ratio: 14 (calc) (ref 6–22)
BUN: 20 mg/dL (ref 7–25)
CO2: 24 mmol/L (ref 20–32)
Calcium: 9.6 mg/dL (ref 8.6–10.3)
Chloride: 108 mmol/L (ref 98–110)
Creat: 1.47 mg/dL — ABNORMAL HIGH (ref 0.70–1.25)
GFR, EST NON AFRICAN AMERICAN: 49 mL/min/{1.73_m2} — AB (ref >=60)
GFR, Est African American: 57 mL/min/{1.73_m2} — ABNORMAL LOW (ref >=60)
Globulin: 3.1 g/dL (calc) (ref 1.9–3.7)
Glucose, Bld: 74 mg/dL (ref 65–99)
POTASSIUM: 4.5 mmol/L (ref 3.5–5.3)
Sodium: 143 mmol/L (ref 135–146)
Total Bilirubin: 0.4 mg/dL (ref 0.2–1.2)
Total Protein: 7.2 g/dL (ref 6.1–8.1)

## 2018-07-05 LAB — HEMOGLOBIN A1C
Hgb A1c MFr Bld: 7.5 % of total Hgb — ABNORMAL HIGH (ref <5.7)
Mean Plasma Glucose: 169 (calc)
eAG (mmol/L): 9.3 (calc)

## 2018-07-09 ENCOUNTER — Encounter: Payer: Self-pay | Admitting: Family Medicine

## 2018-07-09 ENCOUNTER — Other Ambulatory Visit: Payer: Self-pay

## 2018-07-09 ENCOUNTER — Ambulatory Visit (INDEPENDENT_AMBULATORY_CARE_PROVIDER_SITE_OTHER): Payer: Medicare Other | Admitting: Family Medicine

## 2018-07-09 VITALS — BP 134/82 | Ht 69.0 in | Wt 224.0 lb

## 2018-07-09 DIAGNOSIS — E559 Vitamin D deficiency, unspecified: Secondary | ICD-10-CM

## 2018-07-09 DIAGNOSIS — E1159 Type 2 diabetes mellitus with other circulatory complications: Secondary | ICD-10-CM

## 2018-07-09 DIAGNOSIS — E1122 Type 2 diabetes mellitus with diabetic chronic kidney disease: Secondary | ICD-10-CM

## 2018-07-09 DIAGNOSIS — E785 Hyperlipidemia, unspecified: Secondary | ICD-10-CM | POA: Diagnosis not present

## 2018-07-09 DIAGNOSIS — Z125 Encounter for screening for malignant neoplasm of prostate: Secondary | ICD-10-CM

## 2018-07-09 DIAGNOSIS — I129 Hypertensive chronic kidney disease with stage 1 through stage 4 chronic kidney disease, or unspecified chronic kidney disease: Secondary | ICD-10-CM

## 2018-07-09 DIAGNOSIS — I2510 Atherosclerotic heart disease of native coronary artery without angina pectoris: Secondary | ICD-10-CM

## 2018-07-09 DIAGNOSIS — N182 Chronic kidney disease, stage 2 (mild): Secondary | ICD-10-CM

## 2018-07-09 DIAGNOSIS — E669 Obesity, unspecified: Secondary | ICD-10-CM

## 2018-07-09 MED ORDER — GLIPIZIDE 10 MG PO TABS: 10.0000 mg | ORAL_TABLET | Freq: Every day | ORAL | 3 refills | Status: DC

## 2018-07-09 NOTE — Progress Notes (Signed)
Virtual Visit via Telephone Note  I connected with Robert [name] on 07/09/18 at  8:20 AM EDT by telephone and verified that I am speaking with the correct person using two identifiers.   I discussed the limitations, risks, security and privacy concerns of performing an evaluation and management service by telephone and the availability of in person appointments. I also discussed with the patient that there may be a patient responsible charge related to this service. The patient expressed understanding and agreed to proceed. I am in my house and the patient is at his home, visual contact , though desired, is not possible   History of Present Illness:   f/u chronic problems and lab review Denies recent fever or chills. Denies sinus pressure, nasal congestion, ear pain or sore throat. Denies chest congestion, productive cough or wheezing. Denies chest pains, palpitations and leg swelling Denies abdominal pain, nausea, vomiting,diarrhea or constipation.   Denies dysuria, frequency, hesitancy or incontinence. Denies uncontrolled joint pain, swelling and limitation in mobility.Still experiences tightness in the groin when walking, but improved Denies headaches, seizures, numbness, or tingling. Denies depression, anxiety or insomnia. Denies skin break down or rash.     Observations/Objective:  BP 134/82   Ht 5\' 9"  (1.753 m)   Wt 224 lb (101.6 kg)   BMI 33.08 kg/m   Assessment and Plan: Type 2 diabetes mellitus with vascular disease (HCC) Markedly improved and controlled Robert [name] is reminded of the importance of commitment to daily physical activity for 30 minutes or more, as able and the need to limit carbohydrate intake to 30 to 60 grams per meal to help with blood sugar control.   The need to take medication as prescribed, test blood sugar as directed, and to call between visits if there is a concern that blood sugar is uncontrolled is also discussed.   Robert [name] is reminded  of the importance of daily foot exam, annual eye examination, and good blood sugar, blood pressure and cholesterol control.  Diabetic Labs Latest Ref Rng & Units 07/04/2018 04/02/2018 02/26/2018 12/26/2017 11/21/2017  HbA1c <5.7 % of total Hgb 7.5(H) 8.1(H) - 8.6(H) -  Microalbumin mg/dL - - 8.0 - -  Micro/Creat Ratio <30 mcg/mg creat - - 17 - -  Chol <200 mg/dL - 116 - - 138  HDL >40 mg/dL - 39(L) - - 35(L)  Calc LDL mg/dL (calc) - 59 - - 71  Triglycerides <150 mg/dL - 97 - - 231(H)  Creatinine 0.70 - 1.25 mg/dL 1.47(H) 1.32(H) - - 1.39(H)   BP/Weight 07/09/2018 03/20/2018 03/05/2018 11/12/2017 08/28/2017 07/26/2017 1/95/0932  Systolic BP 671 245 809 983 382 505 397  Diastolic BP 82 80 80 80 80 74 69  Wt. (Lbs) 224 223.7 223.12 224 223 223 227  BMI 33.08 33.03 32.95 33.08 32.93 32.93 33.52   Foot/eye exam completion dates Latest Ref Rng & Units 05/30/2018 08/28/2017  Eye Exam No Retinopathy No Retinopathy -  Foot exam Order - - -  Foot Form Completion - - Done        Hypertension associated with stage 2 chronic kidney disease due to type 2 diabetes mellitus Controlled, no change in medication DASH diet and commitment to daily physical activity for a minimum of 30 minutes discussed and encouraged, as a part of hypertension management. The importance of attaining a healthy weight is also discussed.  BP/Weight 07/09/2018 03/20/2018 03/05/2018 11/12/2017 08/28/2017 07/26/2017 6/73/4193  Systolic BP 790 240 973 532 992 426 834  Diastolic BP 82  80 80 80 80 74 69  Wt. (Lbs) 224 223.7 223.12 224 223 223 227  BMI 33.08 33.03 32.95 33.08 32.93 32.93 33.52       Hyperlipidemia LDL goal <70 Hyperlipidemia:Low fat diet discussed and encouraged.   Lipid Panel  Lab Results  Component Value Date   CHOL 116 04/02/2018   HDL 39 (L) 04/02/2018   LDLCALC 59 04/02/2018   TRIG 97 04/02/2018   CHOLHDL 3.0 04/02/2018   Encouraged to increase exercise commitment Updated lab needed at/ before next  visit.     ASCVD (arteriosclerotic cardiovascular disease) Denies chest pain, PND , orthopnea or palpitations, currently clinically stable. Also followed by Cardiology  Obesity (BMI 30.0-34.9)   Patient re-educated about  the importance of commitment to a  minimum of 150 minutes of exercise per week as able.  The importance of healthy food choices with portion control discussed, as well as eating regularly and within a 12 hour window most days. The need to choose "clean , green" food 50 to 75% of the time is discussed, as well as to make water the primary drink and set a goal of 64 ounces water daily.  Encouraged to start a food diary,  and to consider  joining a support group. Sample diet sheets offered. Goals set by the patient for the next several months.   Weight /BMI 07/09/2018 03/20/2018 03/05/2018  WEIGHT 224 lb 223 lb 11.2 oz 223 lb 1.9 oz  HEIGHT 5\' 9"  5\' 9"  5\' 9"   BMI 33.08 kg/m2 33.03 kg/m2 32.95 kg/m2        Follow Up Instructions:    I discussed the assessment and treatment plan with the patient. The patient was provided an opportunity to ask questions and all were answered. The patient agreed with the plan and demonstrated an understanding of the instructions.   The patient was advised to call back or seek an in-person evaluation if the symptoms worsen or if the condition fails to improve as anticipated.  I provided 22 minutes of non-face-to-face time during this encounter.   Tula Nakayama, MD

## 2018-07-09 NOTE — Patient Instructions (Addendum)
Annual physical exam with MD end  August/ early September with flu vaccine, call if you need me before  Fasting lipid, cmp and EGFr ( drink water please!), HBA1C, tsh , PSA and vit D August 23 or shortly after  Wellness with nurse please schedule for early December  CONGRATS on GREAT improvement in blood sugar, continue levimir 80 units daily and glipizide 10 mg one daily  It is important that you exercise regularly at least 30 minutes 5 times a week. If you develop chest pain, have severe difficulty breathing, or feel very tired, stop exercising immediately and seek medical attention   Social distancing. Frequent hand washing with soap and water Keeping your hands off of your face. These 3 practices will help to keep both you and your community healthy during this time. Please practice them faithfully!   Thanks for choosing [facility], we consider it a privelige to serve you.

## 2018-07-10 NOTE — Assessment & Plan Note (Signed)
Denies chest pain, PND , orthopnea or palpitations, currently clinically stable. Also followed by Cardiology

## 2018-07-10 NOTE — Assessment & Plan Note (Signed)
Controlled, no change in medication DASH diet and commitment to daily physical activity for a minimum of 30 minutes discussed and encouraged, as a part of hypertension management. The importance of attaining a healthy weight is also discussed.  BP/Weight 07/09/2018 03/20/2018 03/05/2018 11/12/2017 08/28/2017 07/26/2017 9/98/3382  Systolic BP 505 397 673 419 379 024 097  Diastolic BP 82 80 80 80 80 74 69  Wt. (Lbs) 224 223.7 223.12 224 223 223 227  BMI 33.08 33.03 32.95 33.08 32.93 32.93 33.52

## 2018-07-10 NOTE — Assessment & Plan Note (Signed)
Hyperlipidemia:Low fat diet discussed and encouraged.   Lipid Panel  Lab Results  Component Value Date   CHOL 116 04/02/2018   HDL 39 (L) 04/02/2018   LDLCALC 59 04/02/2018   TRIG 97 04/02/2018   CHOLHDL 3.0 04/02/2018   Encouraged to increase exercise commitment Updated lab needed at/ before next visit.

## 2018-07-10 NOTE — Assessment & Plan Note (Signed)
   Patient re-educated about  the importance of commitment to a  minimum of 150 minutes of exercise per week as able.  The importance of healthy food choices with portion control discussed, as well as eating regularly and within a 12 hour window most days. The need to choose "clean , green" food 50 to 75% of the time is discussed, as well as to make water the primary drink and set a goal of 64 ounces water daily.  Encouraged to start a food diary,  and to consider  joining a support group. Sample diet sheets offered. Goals set by the patient for the next several months.   Weight /BMI 07/09/2018 03/20/2018 03/05/2018  WEIGHT 224 lb 223 lb 11.2 oz 223 lb 1.9 oz  HEIGHT 5\' 9"  5\' 9"  5\' 9"   BMI 33.08 kg/m2 33.03 kg/m2 32.95 kg/m2

## 2018-07-10 NOTE — Assessment & Plan Note (Signed)
Markedly improved and controlled Robert [name] is reminded of the importance of commitment to daily physical activity for 30 minutes or more, as able and the need to limit carbohydrate intake to 30 to 60 grams per meal to help with blood sugar control.   The need to take medication as prescribed, test blood sugar as directed, and to call between visits if there is a concern that blood sugar is uncontrolled is also discussed.   Robert [name] is reminded of the importance of daily foot exam, annual eye examination, and good blood sugar, blood pressure and cholesterol control.  Diabetic Labs Latest Ref Rng & Units 07/04/2018 04/02/2018 02/26/2018 12/26/2017 11/21/2017  HbA1c <5.7 % of total Hgb 7.5(H) 8.1(H) - 8.6(H) -  Microalbumin mg/dL - - 8.0 - -  Micro/Creat Ratio <30 mcg/mg creat - - 17 - -  Chol <200 mg/dL - 116 - - 138  HDL >40 mg/dL - 39(L) - - 35(L)  Calc LDL mg/dL (calc) - 59 - - 71  Triglycerides <150 mg/dL - 97 - - 231(H)  Creatinine 0.70 - 1.25 mg/dL 1.47(H) 1.32(H) - - 1.39(H)   BP/Weight 07/09/2018 03/20/2018 03/05/2018 11/12/2017 08/28/2017 07/26/2017 1/61/0960  Systolic BP 454 098 119 147 829 562 130  Diastolic BP 82 80 80 80 80 74 69  Wt. (Lbs) 224 223.7 223.12 224 223 223 227  BMI 33.08 33.03 32.95 33.08 32.93 32.93 33.52   Foot/eye exam completion dates Latest Ref Rng & Units 05/30/2018 08/28/2017  Eye Exam No Retinopathy No Retinopathy -  Foot exam Order - - -  Foot Form Completion - - Done

## 2018-08-09 ENCOUNTER — Telehealth: Payer: Self-pay

## 2018-08-09 NOTE — Telephone Encounter (Signed)
Pt agrees to virtual video. He will have meds/ vitals ready prior to appt.       Virtual Visit Pre-Appointment Phone Call  "(Name), I am calling you today to discuss your upcoming appointment. We are currently trying to limit exposure to the virus that causes COVID-19 by seeing patients at home rather than in the office."  1. "What is the BEST phone number to call the day of the visit?" - include this in appointment notes  2. "Do you have or have access to (through a family member/friend) a smartphone with video capability that we can use for your visit?" a. If yes - list this number in appt notes as "cell" (if different from BEST phone #) and list the appointment type as a VIDEO visit in appointment notes b. If no - list the appointment type as a PHONE visit in appointment notes  3. Confirm consent - "In the setting of the current Covid19 crisis, you are scheduled for a (phone or video) visit with your provider on (date) at (time).  Just as we do with many in-office visits, in order for you to participate in this visit, we must obtain consent.  If you'd like, I can send this to your mychart (if signed up) or email for you to review.  Otherwise, I can obtain your verbal consent now.  All virtual visits are billed to your insurance company just like a normal visit would be.  By agreeing to a virtual visit, we'd like you to understand that the technology does not allow for your provider to perform an examination, and thus may limit your provider's ability to fully assess your condition. If your provider identifies any concerns that need to be evaluated in person, we will make arrangements to do so.  Finally, though the technology is pretty good, we cannot assure that it will always work on either your or our end, and in the setting of a video visit, we may have to convert it to a phone-only visit.  In either situation, we cannot ensure that we have a secure connection.  Are you willing to proceed?"  STAFF: Did the patient verbally acknowledge consent to telehealth visit? Document YES/NO here: YES   4. Advise patient to be prepared - "Two hours prior to your appointment, go ahead and check your blood pressure, pulse, oxygen saturation, and your weight (if you have the equipment to check those) and write them all down. When your visit starts, your provider will ask you for this information. If you have an Apple Watch or Kardia device, please plan to have heart rate information ready on the day of your appointment. Please have a pen and paper handy nearby the day of the visit as well."  5. Give patient instructions for MyChart download to smartphone OR Doximity/Doxy.me as below if video visit (depending on what platform provider is using)  6. Inform patient they will receive a phone call 15 minutes prior to their appointment time (may be from unknown caller ID) so they should be prepared to answer    Robert [name] has been deemed a candidate for a follow-up tele-health visit to limit community exposure during the Covid-19 pandemic. I spoke with the patient via phone to ensure availability of phone/video source, confirm preferred email & phone number, and discuss instructions and expectations.  I reminded Robert [name] to be prepared with any vital sign and/or heart rhythm information that could potentially be obtained via home  monitoring, at the time of his visit. I reminded Robert [name] to expect a phone call prior to his visit.  Drema Dallas, [place] 08/09/2018 10:50 AM   INSTRUCTIONS FOR DOWNLOADING THE MYCHART APP TO SMARTPHONE  - The patient must first make sure to have activated MyChart and know their login information - If Apple, go to [employer] and type in MyChart in the search bar and download the app. If Android, ask patient to go to [employer] and type in [facility] in the search bar and download the app. The app is free but as with any other app  downloads, their phone may require them to verify saved payment information or Apple/Android password.  - The patient will need to then log into the app with their MyChart username and password, and select [facility] as their healthcare provider to link the account. When it is time for your visit, go to the MyChart app, find appointments, and click Begin Video Visit. Be sure to Select Allow for your device to access the Microphone and Camera for your visit. You will then be connected, and your provider will be with you shortly.  **If they have any issues connecting, or need assistance please contact MyChart service desk [phone])**  **If using a computer, in order to ensure the best quality for their visit they will need to use either of the following Internet Browsers: [employer], or Google Chrome**  IF USING DOXIMITY or DOXY.ME - The patient will receive a link just prior to their visit by text.     FULL LENGTH CONSENT FOR TELE-HEALTH VISIT   I hereby voluntarily request, consent and authorize [facility] and its employed or contracted physicians, physician assistants, nurse practitioners or other licensed health care professionals (the Practitioner), to provide me with telemedicine health care services (the "Services") as deemed necessary by the treating Practitioner. I acknowledge and consent to receive the Services by the Practitioner via telemedicine. I understand that the telemedicine visit will involve communicating with the Practitioner through live audiovisual communication technology and the disclosure of certain medical information by electronic transmission. I acknowledge that I have been given the opportunity to request an in-person assessment or other available alternative prior to the telemedicine visit and am voluntarily participating in the telemedicine visit.  I understand that I have the right to withhold or withdraw my consent to the use of telemedicine  in the course of my care at any time, without affecting my right to future care or treatment, and that the Practitioner or I may terminate the telemedicine visit at any time. I understand that I have the right to inspect all information obtained and/or recorded in the course of the telemedicine visit and may receive copies of available information for a reasonable fee.  I understand that some of the potential risks of receiving the Services via telemedicine include:  Marland Kitchen Delay or interruption in medical evaluation due to technological equipment failure or disruption; . Information transmitted may not be sufficient (e.g. poor resolution of images) to allow for appropriate medical decision making by the Practitioner; and/or  . In rare instances, security protocols could fail, causing a breach of personal health information.  Furthermore, I acknowledge that it is my responsibility to provide information about my medical history, conditions and care that is complete and accurate to the best of my ability. I acknowledge that Practitioner's advice, recommendations, and/or decision may be based on factors not within their control, such as incomplete  or inaccurate data provided by me or distortions of diagnostic images or specimens that may result from electronic transmissions. I understand that the practice of medicine is not an exact science and that Practitioner makes no warranties or guarantees regarding treatment outcomes. I acknowledge that I will receive a copy of this consent concurrently upon execution via email to the email address I last provided but may also request a printed copy by calling the office of [facility].    I understand that my insurance will be billed for this visit.   I have read or had this consent read to me. . I understand the contents of this consent, which adequately explains the benefits and risks of the Services being provided via telemedicine.  . I have been provided ample  opportunity to ask questions regarding this consent and the Services and have had my questions answered to my satisfaction. . I give my informed consent for the services to be provided through the use of telemedicine in my medical care  By participating in this telemedicine visit I agree to the above.

## 2018-08-13 ENCOUNTER — Ambulatory Visit (INDEPENDENT_AMBULATORY_CARE_PROVIDER_SITE_OTHER): Payer: Medicare Other | Admitting: Podiatry

## 2018-08-13 ENCOUNTER — Encounter: Payer: Self-pay | Admitting: Podiatry

## 2018-08-13 ENCOUNTER — Other Ambulatory Visit: Payer: Self-pay

## 2018-08-13 VITALS — Temp 97.7°F

## 2018-08-13 DIAGNOSIS — B351 Tinea unguium: Secondary | ICD-10-CM | POA: Diagnosis not present

## 2018-08-13 DIAGNOSIS — Q828 Other specified congenital malformations of skin: Secondary | ICD-10-CM

## 2018-08-13 DIAGNOSIS — E1151 Type 2 diabetes mellitus with diabetic peripheral angiopathy without gangrene: Secondary | ICD-10-CM

## 2018-08-13 DIAGNOSIS — L84 Corns and callosities: Secondary | ICD-10-CM | POA: Diagnosis not present

## 2018-08-13 DIAGNOSIS — M79674 Pain in right toe(s): Secondary | ICD-10-CM | POA: Diagnosis not present

## 2018-08-13 DIAGNOSIS — M79675 Pain in left toe(s): Secondary | ICD-10-CM

## 2018-08-13 NOTE — Patient Instructions (Signed)
Onychomycosis/Fungal Toenails  WHAT IS IT? An infection that lies within the keratin of your nail plate that is caused by a fungus.  WHY ME? Fungal infections affect all ages, sexes, races, and creeds.  There may be many factors that predispose you to a fungal infection such as age, coexisting medical conditions such as diabetes, or an autoimmune disease; stress, medications, fatigue, genetics, etc.  Bottom line: fungus thrives in a warm, moist environment and your shoes offer such a location.  IS IT CONTAGIOUS? Theoretically, yes.  You do not want to share shoes, nail clippers or files with someone who has fungal toenails.  Walking around barefoot in the same room or sleeping in the same bed is unlikely to transfer the organism.  It is important to realize, however, that fungus can spread easily from one nail to the next on the same foot.  HOW DO WE TREAT THIS?  There are several ways to treat this condition.  Treatment may depend on many factors such as age, medications, pregnancy, liver and kidney conditions, etc.  It is best to ask your doctor which options are available to you.  1. No treatment.   Unlike many other medical concerns, you can live with this condition.  However for many people this can be a painful condition and may lead to ingrown toenails or a bacterial infection.  It is recommended that you keep the nails cut short to help reduce the amount of fungal nail. 2. Topical treatment.  These range from herbal remedies to prescription strength nail lacquers.  About 40-50% effective, topicals require twice daily application for approximately 9 to 12 months or until an entirely new nail has grown out.  The most effective topicals are medical grade medications available through physicians offices. 3. Oral antifungal medications.  With an 80-90% cure rate, the most common oral medication requires 3 to 4 months of therapy and stays in your system for a year as the new nail grows out.  Oral  antifungal medications do require blood work to make sure it is a safe drug for you.  A liver function panel will be performed prior to starting the medication and after the first month of treatment.  It is important to have the blood work performed to avoid any harmful side effects.  In general, this medication safe but blood work is required. 4. Laser Therapy.  This treatment is performed by applying a specialized laser to the affected nail plate.  This therapy is noninvasive, fast, and non-painful.  It is not covered by insurance and is therefore, out of pocket.  The results have been very good with a 80-95% cure rate.  [facility] is the only practice in the area to offer this therapy. 5. Permanent Nail Avulsion.  Removing the entire nail so that a new nail will not grow back.  Corns and Calluses Corns are small areas of thickened skin that occur on the top, sides, or tip of a toe. They contain a cone-shaped core with a point that can press on a nerve below. This causes pain.  Calluses are areas of thickened skin that can occur anywhere on the body, including the hands, fingers, palms, soles of the feet, and heels. Calluses are usually larger than corns. What are the causes? Corns and calluses are caused by rubbing (friction) or pressure, such as from shoes that are too tight or do not fit properly. What increases the risk? Corns are more likely to develop in people   who have misshapen toes (toe deformities), such as hammer toes. Calluses can occur with friction to any area of the skin. They are more likely to develop in people who:  Work with their hands.  Wear shoes that fit poorly, are too tight, or are high-heeled.  Have toe deformities. What are the signs or symptoms? Symptoms of a corn or callus include:  A hard growth on the skin.  Pain or tenderness under the skin.  Redness and swelling.  Increased discomfort while wearing tight-fitting shoes, if your feet are  affected. If a corn or callus becomes infected, symptoms may include:  Redness and swelling that gets worse.  Pain.  Fluid, blood, or pus draining from the corn or callus. How is this diagnosed? Corns and calluses may be diagnosed based on your symptoms, your medical history, and a physical exam. How is this treated? Treatment for corns and calluses may include:  Removing the cause of the friction or pressure. This may involve: ? Changing your shoes. ? Wearing shoe inserts (orthotics) or other protective layers in your shoes, such as a corn pad. ? Wearing gloves.  Applying medicine to the skin (topical medicine) to help soften skin in the hardened, thickened areas.  Removing layers of dead skin with a file to reduce the size of the corn or callus.  Removing the corn or callus with a scalpel or laser.  Taking antibiotic medicines, if your corn or callus is infected.  Having surgery, if a toe deformity is the cause. Follow these instructions at home:   Take over-the-counter and prescription medicines only as told by your health care provider.  If you were prescribed an antibiotic, take it as told by your health care provider. Do not stop taking it even if your condition starts to improve.  Wear shoes that fit well. Avoid wearing high-heeled shoes and shoes that are too tight or too loose.  Wear any padding, protective layers, gloves, or orthotics as told by your health care provider.  Soak your hands or feet and then use a file or pumice stone to soften your corn or callus. Do this as told by your health care provider.  Check your corn or callus every day for symptoms of infection. Contact a health care provider if you:  Notice that your symptoms do not improve with treatment.  Have redness or swelling that gets worse.  Notice that your corn or callus becomes painful.  Have fluid, blood, or pus coming from your corn or callus.  Have new symptoms. Summary  Corns are  small areas of thickened skin that occur on the top, sides, or tip of a toe.  Calluses are areas of thickened skin that can occur anywhere on the body, including the hands, fingers, palms, and soles of the feet. Calluses are usually larger than corns.  Corns and calluses are caused by rubbing (friction) or pressure, such as from shoes that are too tight or do not fit properly.  Treatment may include wearing any padding, protective layers, gloves, or orthotics as told by your health care provider. This information is not intended to replace advice given to you by your health care provider. Make sure you discuss any questions you have with your health care provider. Document Released: 12/25/2003 Document Revised: 01/31/2017 Document Reviewed: 01/31/2017 Elsevier Interactive Patient Education  2019 [employer] Inc.  Diabetes Mellitus and Foot Care Foot care is an important part of your health, especially when you have diabetes. Diabetes may cause you to have   problems because of poor blood flow (circulation) to your feet and legs, which can cause your skin to: Become thinner and drier. Break more easily. Heal more slowly. Peel and crack. You may also have nerve damage (neuropathy) in your legs and feet, causing decreased feeling in them. This means that you may not notice minor injuries to your feet that could lead to more serious problems. Noticing and addressing any potential problems early is the best way to prevent future foot problems. How to care for your feet Foot hygiene Wash your feet daily with warm water and mild soap. Do not use hot water. Then, pat your feet and the areas between your toes until they are completely dry. Do not soak your feet as this can dry your skin. Trim your toenails straight across. Do not dig under them or around the cuticle. File the edges of your nails with an emery board or nail file. Apply a moisturizing lotion or petroleum jelly to the skin on your feet and to  dry, brittle toenails. Use lotion that does not contain alcohol and is unscented. Do not apply lotion between your toes. Shoes and socks Wear clean socks or stockings every day. Make sure they are not too tight. Do not wear knee-high stockings since they may decrease blood flow to your legs. Wear shoes that fit properly and have enough cushioning. Always look in your shoes before you put them on to be sure there are no objects inside. To break in new shoes, wear them for just a few hours a day. This prevents injuries on your feet. Wounds, scrapes, corns, and calluses Check your feet daily for blisters, cuts, bruises, sores, and redness. If you cannot see the bottom of your feet, use a mirror or ask someone for help. Do not cut corns or calluses or try to remove them with medicine. If you find a minor scrape, cut, or break in the skin on your feet, keep it and the skin around it clean and dry. You may clean these areas with mild soap and water. Do not clean the area with peroxide, alcohol, or iodine. If you have a wound, scrape, corn, or callus on your foot, look at it several times a day to make sure it is healing and not infected. Check for: Redness, swelling, or pain. Fluid or blood. Warmth. Pus or a bad smell. General instructions Do not cross your legs. This may decrease blood flow to your feet. Do not use heating pads or hot water bottles on your feet. They may burn your skin. If you have lost feeling in your feet or legs, you may not know this is happening until it is too late. Protect your feet from hot and cold by wearing shoes, such as at the beach or on hot pavement. Schedule a complete foot exam at least once a year (annually) or more often if you have foot problems. If you have foot problems, report any cuts, sores, or bruises to your health care provider immediately. Contact a health care provider if: You have a medical condition that increases your risk of infection and you have any  cuts, sores, or bruises on your feet. You have an injury that is not healing. You have redness on your legs or feet. You feel burning or tingling in your legs or feet. You have pain or cramps in your legs and feet. Your legs or feet are numb. Your feet always feel cold. You have pain around a toenail. Get help   right away if: You have a wound, scrape, corn, or callus on your foot and: You have pain, swelling, or redness that gets worse. You have fluid or blood coming from the wound, scrape, corn, or callus. Your wound, scrape, corn, or callus feels warm to the touch. You have pus or a bad smell coming from the wound, scrape, corn, or callus. You have a fever. You have a red line going up your leg. Summary Check your feet every day for cuts, sores, red spots, swelling, and blisters. Moisturize feet and legs daily. Wear shoes that fit properly and have enough cushioning. If you have foot problems, report any cuts, sores, or bruises to your health care provider immediately. Schedule a complete foot exam at least once a year (annually) or more often if you have foot problems. This information is not intended to replace advice given to you by your health care provider. Make sure you discuss any questions you have with your health care provider. Document Released: 03/17/2000 Document Revised: 05/02/2017 Document Reviewed: 04/21/2016 Elsevier Interactive Patient Education  2019 Elsevier Inc.  

## 2018-08-14 ENCOUNTER — Telehealth (INDEPENDENT_AMBULATORY_CARE_PROVIDER_SITE_OTHER): Payer: Medicare Other | Admitting: Cardiology

## 2018-08-14 VITALS — BP 139/86 | HR 68 | Temp 97.7°F | Ht 69.0 in | Wt 225.0 lb

## 2018-08-14 DIAGNOSIS — I10 Essential (primary) hypertension: Secondary | ICD-10-CM

## 2018-08-14 DIAGNOSIS — E782 Mixed hyperlipidemia: Secondary | ICD-10-CM

## 2018-08-14 DIAGNOSIS — E119 Type 2 diabetes mellitus without complications: Secondary | ICD-10-CM

## 2018-08-14 DIAGNOSIS — I2510 Atherosclerotic heart disease of native coronary artery without angina pectoris: Secondary | ICD-10-CM | POA: Diagnosis not present

## 2018-08-14 NOTE — Progress Notes (Signed)
Virtual Visit via Video Note   This visit type was conducted due to national recommendations for restrictions regarding the COVID-19 Pandemic (e.g. social distancing) in an effort to limit this patient's exposure and mitigate transmission in our community.  Due to his co-morbid illnesses, this patient is at least at moderate risk for complications without adequate follow up.  This format is felt to be most appropriate for this patient at this time.  All issues noted in this document were discussed and addressed.  A limited physical exam was performed with this format.  Please refer to the patient's chart for his consent to telehealth for [facility].   Date:  08/14/2018   ID:  Robert [name], DOB 1951/01/04, MRN [id number]  Patient Location: Home Provider Location: Home  PCP:  Fayrene Helper, MD  Cardiologist:  Carlyle Dolly, MD  Electrophysiologist:  None   Evaluation Performed:  Follow-Up Visit  Chief Complaint:  1 year follow up  History of Present Illness:    Robert [name] is a 68 y.o. male with seen today for follow up of the following medical problems.   1. CAD - history of prior stenting as described below -08/2014 exercise nuclear stress with Duke treadmill score of 7 consistent with low risk, small area of inferolateral ischemia. Mild inferior ischemia has been noted previously in 12/2009 stress, from notes he has history of RCA CTO that was not able to be opened during 2003 cath in [place].     - no recent chest pain. No SOB or DOE. Nonspecific fatigue with activities - compliant with meds  2. HTN - home bp's typically around 130s/80s  - he iscompliant with meds  3. Hyperlipidemia - compliant with statin 03/2018 TC 116 HDL 39 LDL 59 TG 97   4. DM II - followed by Dr Moshe Cipro   5. OSA -mixed compliancewith cpap  6. AAA screen - no aneurysm by [place] 08/2010    The patient does not have symptoms concerning for COVID-19 infection  (fever, chills, cough, or new shortness of breath).    Past Medical History:  Diagnosis Date   Arteriosclerotic cardiovascular disease (ASCVD)    DES to LAD D1 - 2003 in [place]; failed intervention for a totally obstructed RCA at that time; EF of 45%; 12/2009: Equivocal stress nuclear with good exercise tolerance, negative stress EKG, normal EF with mild mid and distal inferior ischemia   Coronary artery disease    Diabetes mellitus, insulin dependent (IDDM), controlled ([place]) 10/25/2012   HBA1C is 6.9 on 10/22/2012    Diabetes mellitus, type II ([place])    Hyperlipidemia    Lipid profile in 09/2011:128, 130, 33, 69   Hypertension    Lab  09/2011: Normal CMet ex G-133   Tobacco abuse, in remission    30 pack years; quit in 1985   Past Surgical History:  Procedure Laterality Date   CARDIAC CATHETERIZATION     COLONOSCOPY  2010   Negative screening study   COLONOSCOPY N/A 11/11/2014   Procedure: COLONOSCOPY;  Surgeon: Rogene Houston, MD;  Location: [facility];  Service: Endoscopy;  Laterality: N/A;  [place] ARTHROSCOPY W/ MENISCECTOMY  11/2008   Right   PRESSURE ULCER DEBRIDEMENT  2006   Right lower extremity   ROTATOR CUFF REPAIR  07/2009   Right     Current Meds  Medication Sig   ACCU-CHEK FASTCLIX LANCETS MISC Use up to three times daily  Dx e11.22   aspirin 81 MG tablet Take 81 mg by mouth daily.     atorvastatin (LIPITOR) 40 MG tablet Take 1 tablet (40 mg total) by mouth daily.   Blood Glucose Monitoring Suppl (ACCU-CHEK NANO SMARTVIEW) w/Device KIT TEST 1 TO 2 TIMES DAILY   glipiZIDE (GLUCOTROL) 10 MG tablet Take 1 tablet (10 mg total) by mouth daily before breakfast.   glucose blood (ACCU-CHEK SMARTVIEW) test strip Use as instructed three times daily DX e11.9   Insulin Detemir (LEVEMIR) 100 UNIT/ML Pen 85 units once daily   Insulin Pen Needle (B-D ULTRAFINE III SHORT PEN) 31G X 8 MM MISC USE AS DIRECTED WITH LEVEMIR FLEXPEN  daily   Insulin Syringe-Needle U-100 (INSULIN SYRINGE .5CC/30GX5/16") 30G X 5/16" 0.5 ML MISC Use as directed with novolog 70/30   metoprolol succinate (TOPROL-XL) 50 MG 24 hr tablet TAKE 1 TABLET DAILY WITH OR IMMEDIATELY AFTER A MEAL   olmesartan (BENICAR) 20 MG tablet TAKE 1 TABLET DAILY (DOSE REDUCTION)     Allergies:   Rosuvastatin   Social History   Tobacco Use   Smoking status: Former Smoker    Packs/day: 1.00    Years: 30.00    Pack years: 30.00    Types: Cigarettes    Start date: 04/03/1966    Last attempt to quit: 09/02/1983    Years since quitting: 34.9   Smokeless tobacco: Never Used  Substance Use Topics   Alcohol use: No    Alcohol/week: 0.0 standard drinks    Comment: Former Tax inspector -discontinued use in 2005   Drug use: No     Family Hx: The patient's family history includes Arthritis in his brother and mother; Cancer in his father; Diabetes in his brother and sister.  ROS:   Please see the history of present illness.     All other systems reviewed and are negative.   Prior CV studies:   The following studies were reviewed today:  08/2014 Exercise Nuclear stress  Low risk Duke treadmill score of 7.  Blood pressure demonstrated a hypertensive response to exercise.  There was no ST segment deviation noted during stress.  There is a small defect of mild severity present in the basal inferolateral, mid inferolateral and apical inferior location. The defect is reversible. Consistent with mild, predominantly mid to basal inferolateral ischemia.  Myocardial perfusion is abnormal. This is a low risk study.   Labs/Other Tests and Data Reviewed:    EKG:  na  Recent Labs: 11/21/2017: TSH 1.81 04/02/2018: Hemoglobin 13.2; Platelets 268 07/04/2018: ALT 20; BUN 20; Creat 1.47; Potassium 4.5; Sodium 143   Recent Lipid Panel Lab Results  Component Value Date/Time   CHOL 116 04/02/2018 07:41 AM   TRIG 97 04/02/2018 07:41 AM   HDL 39 (L) 04/02/2018  07:41 AM   CHOLHDL 3.0 04/02/2018 07:41 AM   LDLCALC 59 04/02/2018 07:41 AM    Wt Readings from Last 3 Encounters:  08/14/18 225 lb (102.1 kg)  07/09/18 224 lb (101.6 kg)  03/20/18 223 lb 11.2 oz (101.5 kg)     Objective:    Vital Signs:  BP 139/86    Pulse 68    Temp 97.7 F (36.5 C)    Ht _0  (1.753 m)    Wt 225 lb (102.1 kg)    BMI 33.23 kg/m    Well nourished appearing male in no distress sitting comfortable. Normal affect. Normal speech pattern and tone. No visible or audible signs of SOB or wheezing.  ASSESSMENT & PLAN:    1. CAD - no recent symptoms. Nonspecific fatigue I think is more related to deconditioning  - continue current meds  2. HTN - borderline bp control. We discussed DASH diet, weight loss. Goal bp would be <130/80 - monitor at this time. If progresses likely would add norvasc, likely avoid increasing his benicar due to his renla function  3. Hyperlipidemia - he is at goal, continue current meds  4. DM2 - continue statin and ARB for cardiovsacular benefits in setting of DM2     COVID-19 Education: The signs and symptoms of COVID-19 were discussed with the patient and how to seek care for testing (follow up with PCP or arrange E-visit).  The importance of social distancing was discussed today.  Time:   Today, I have spent 18 minutes with the patient with telehealth technology discussing the above problems.     Medication Adjustments/Labs and Tests Ordered: Current medicines are reviewed at length with the patient today.  Concerns regarding medicines are outlined above.   Tests Ordered: No orders of the defined types were placed in this encounter.   Medication Changes: No orders of the defined types were placed in this encounter.   Disposition:  Follow up 6 months  Signed, Carlyle Dolly, MD  08/14/2018 3:11 PM    [facility]

## 2018-08-14 NOTE — Progress Notes (Signed)

## 2018-08-18 NOTE — Progress Notes (Signed)
Subjective: Robert [name] presents with diabetes, diabetic neuropathy and cc of painful, discolored, thick toenails and painful callus which interferes with activities of daily living. Pain is aggravated when wearing enclosed shoe gear. Pain is relieved with periodic professional debridement.  Fayrene Helper, MD is his PCP.    Current Outpatient Medications:  .  ACCU-CHEK FASTCLIX LANCETS MISC, Use up to three times daily Dx e11.22, Disp: 300 each, Rfl: 1 .  aspirin 81 MG tablet, Take 81 mg by mouth daily.  , Disp: , Rfl:  .  atorvastatin (LIPITOR) 40 MG tablet, Take 1 tablet (40 mg total) by mouth daily., Disp: 90 tablet, Rfl: 2 .  Blood Glucose Monitoring Suppl (ACCU-CHEK NANO SMARTVIEW) w/Device KIT, TEST 1 TO 2 TIMES DAILY, Disp: 1 kit, Rfl: 0 .  glipiZIDE (GLUCOTROL) 10 MG tablet, Take 1 tablet (10 mg total) by mouth daily before breakfast., Disp: 90 tablet, Rfl: 3 .  glucose blood (ACCU-CHEK SMARTVIEW) test strip, Use as instructed three times daily DX e11.9, Disp: 300 each, Rfl: 1 .  Insulin Detemir (LEVEMIR) 100 UNIT/ML Pen, 85 units once daily, Disp: 45 mL, Rfl: 5 .  Insulin Pen Needle (B-D ULTRAFINE III SHORT PEN) 31G X 8 MM MISC, USE AS DIRECTED WITH LEVEMIR FLEXPEN daily, Disp: 100 each, Rfl: 5 .  Insulin Syringe-Needle U-100 (INSULIN SYRINGE .5CC/30GX5/16") 30G X 5/16" 0.5 ML MISC, Use as directed with novolog 70/30, Disp: 100 each, Rfl: 3 .  metoprolol succinate (TOPROL-XL) 50 MG 24 hr tablet, TAKE 1 TABLET DAILY WITH OR IMMEDIATELY AFTER A MEAL, Disp: 90 tablet, Rfl: 2 .  olmesartan (BENICAR) 20 MG tablet, TAKE 1 TABLET DAILY (DOSE REDUCTION), Disp: 90 tablet, Rfl: 2  Allergies  Allergen Reactions  . Rosuvastatin Other (See Comments)    Adverse GI symptoms    Vascular Examination: Capillary refill time immediate x 10 digits.  Dorsalis pedis pulses 1/4 b/l.  Posterior tibial pulses absent b/l.  Digital hair absent x 10 digits.  Skin temperature gradient WNL  b/l.  Dermatological Examination: Skin is thin, shiny and atrophic b/l.  Toenails 1-5 b/l discolored, thick, dystrophic with subungual debris and pain with palpation to nailbeds due to thickness of nails.  Porokeratotic lesions submet head 5 b/l and mid arch b/l with tenderness to palpation. No erythema, no edema, no drainage, no flocculence.  Musculoskeletal: Muscle strength 5/5 to all LE muscle groups.  Neurological: Sensation intact  with 10 gram monofilament.  Vibratory sensation intact b/l.  Assessment: 1. Painful onychomycosis toenails 1-5 b/l 2. Porokeratosis submet head 5 b/l  3. Callus plantar arch b/l. 4. NIDDM with PAD  Plan: 1. Continue diabetic foot care principles. Literature dispensed on today. 2. Toenails 1-5 b/l were debrided in length and girth without iatrogenic bleeding. 3. Porokeratosis pared and enucleated submetatarsal head(s) 5 b/l and plantar arch b/l utilizing sterile scalpel blade without incident.  4. Patient to continue soft, supportive shoe gear. 5. Patient to report any pedal injuries to medical professional  6. Follow up 3 months.  7. Patient/POA to call should there be a concern in the interim.

## 2018-10-16 ENCOUNTER — Ambulatory Visit: Payer: Medicare Other | Admitting: Podiatry

## 2018-10-16 ENCOUNTER — Encounter: Payer: Self-pay | Admitting: Podiatry

## 2018-10-16 ENCOUNTER — Other Ambulatory Visit: Payer: Self-pay

## 2018-10-16 VITALS — Temp 98.1°F

## 2018-10-16 DIAGNOSIS — Q828 Other specified congenital malformations of skin: Secondary | ICD-10-CM | POA: Diagnosis not present

## 2018-10-16 DIAGNOSIS — M79675 Pain in left toe(s): Secondary | ICD-10-CM | POA: Diagnosis not present

## 2018-10-16 DIAGNOSIS — B351 Tinea unguium: Secondary | ICD-10-CM

## 2018-10-16 DIAGNOSIS — E1151 Type 2 diabetes mellitus with diabetic peripheral angiopathy without gangrene: Secondary | ICD-10-CM

## 2018-10-16 DIAGNOSIS — M79674 Pain in right toe(s): Secondary | ICD-10-CM | POA: Diagnosis not present

## 2018-10-16 NOTE — Patient Instructions (Signed)
Diabetes Mellitus and Foot Care Foot care is an important part of your health, especially when you have diabetes. Diabetes may cause you to have problems because of poor blood flow (circulation) to your feet and legs, which can cause your skin to:  Become thinner and drier.  Break more easily.  Heal more slowly.  Peel and crack. You may also have nerve damage (neuropathy) in your legs and feet, causing decreased feeling in them. This means that you may not notice minor injuries to your feet that could lead to more serious problems. Noticing and addressing any potential problems early is the best way to prevent future foot problems. How to care for your feet Foot hygiene  Wash your feet daily with warm water and mild soap. Do not use hot water. Then, pat your feet and the areas between your toes until they are completely dry. Do not soak your feet as this can dry your skin.  Trim your toenails straight across. Do not dig under them or around the cuticle. File the edges of your nails with an emery board or nail file.  Apply a moisturizing lotion or petroleum jelly to the skin on your feet and to dry, brittle toenails. Use lotion that does not contain alcohol and is unscented. Do not apply lotion between your toes. Shoes and socks  Wear clean socks or stockings every day. Make sure they are not too tight. Do not wear knee-high stockings since they may decrease blood flow to your legs.  Wear shoes that fit properly and have enough cushioning. Always look in your shoes before you put them on to be sure there are no objects inside.  To break in new shoes, wear them for just a few hours a day. This prevents injuries on your feet. Wounds, scrapes, corns, and calluses  Check your feet daily for blisters, cuts, bruises, sores, and redness. If you cannot see the bottom of your feet, use a mirror or ask someone for help.  Do not cut corns or calluses or try to remove them with medicine.  If you  find a minor scrape, cut, or break in the skin on your feet, keep it and the skin around it clean and dry. You may clean these areas with mild soap and water. Do not clean the area with peroxide, alcohol, or iodine.  If you have a wound, scrape, corn, or callus on your foot, look at it several times a day to make sure it is healing and not infected. Check for: ? Redness, swelling, or pain. ? Fluid or blood. ? Warmth. ? Pus or a bad smell. General instructions  Do not cross your legs. This may decrease blood flow to your feet.  Do not use heating pads or hot water bottles on your feet. They may burn your skin. If you have lost feeling in your feet or legs, you may not know this is happening until it is too late.  Protect your feet from hot and cold by wearing shoes, such as at the beach or on hot pavement.  Schedule a complete foot exam at least once a year (annually) or more often if you have foot problems. If you have foot problems, report any cuts, sores, or bruises to your health care provider immediately. Contact a health care provider if:  You have a medical condition that increases your risk of infection and you have any cuts, sores, or bruises on your feet.  You have an injury that is not   healing.  You have redness on your legs or feet.  You feel burning or tingling in your legs or feet.  You have pain or cramps in your legs and feet.  Your legs or feet are numb.  Your feet always feel cold.  You have pain around a toenail. Get help right away if:  You have a wound, scrape, corn, or callus on your foot and: ? You have pain, swelling, or redness that gets worse. ? You have fluid or blood coming from the wound, scrape, corn, or callus. ? Your wound, scrape, corn, or callus feels warm to the touch. ? You have pus or a bad smell coming from the wound, scrape, corn, or callus. ? You have a fever. ? You have a red line going up your leg. Summary  Check your feet every day  for cuts, sores, red spots, swelling, and blisters.  Moisturize feet and legs daily.  Wear shoes that fit properly and have enough cushioning.  If you have foot problems, report any cuts, sores, or bruises to your health care provider immediately.  Schedule a complete foot exam at least once a year (annually) or more often if you have foot problems. This information is not intended to replace advice given to you by your health care provider. Make sure you discuss any questions you have with your health care provider. Document Released: 03/17/2000 Document Revised: 05/02/2017 Document Reviewed: 04/21/2016 Elsevier Patient Education  2020 [employer]   Onychomycosis/Fungal Toenails  WHAT IS IT? An infection that lies within the keratin of your nail plate that is caused by a fungus.  WHY ME? Fungal infections affect all ages, sexes, races, and creeds.  There may be many factors that predispose you to a fungal infection such as age, coexisting medical conditions such as diabetes, or an autoimmune disease; stress, medications, fatigue, genetics, etc.  Bottom line: fungus thrives in a warm, moist environment and your shoes offer such a location.  IS IT CONTAGIOUS? Theoretically, yes.  You do not want to share shoes, nail clippers or files with someone who has fungal toenails.  Walking around barefoot in the same room or sleeping in the same bed is unlikely to transfer the organism.  It is important to realize, however, that fungus can spread easily from one nail to the next on the same foot.  HOW DO WE TREAT THIS?  There are several ways to treat this condition.  Treatment may depend on many factors such as age, medications, pregnancy, liver and kidney conditions, etc.  It is best to ask your doctor which options are available to you.  1. No treatment.   Unlike many other medical concerns, you can live with this condition.  However for many people this can be a painful condition and may lead to  ingrown toenails or a bacterial infection.  It is recommended that you keep the nails cut short to help reduce the amount of fungal nail. 2. Topical treatment.  These range from herbal remedies to prescription strength nail lacquers.  About 40-50% effective, topicals require twice daily application for approximately 9 to 12 months or until an entirely new nail has grown out.  The most effective topicals are medical grade medications available through physicians offices. 3. Oral antifungal medications.  With an 80-90% cure rate, the most common oral medication requires 3 to 4 months of therapy and stays in your system for a year as the new nail grows out.  Oral antifungal medications do require   blood work to make sure it is a safe drug for you.  A liver function panel will be performed prior to starting the medication and after the first month of treatment.  It is important to have the blood work performed to avoid any harmful side effects.  In general, this medication safe but blood work is required. 4. Laser Therapy.  This treatment is performed by applying a specialized laser to the affected nail plate.  This therapy is noninvasive, fast, and non-painful.  It is not covered by insurance and is therefore, out of pocket.  The results have been very good with a 80-95% cure rate.  [facility] is the only practice in the area to offer this therapy. 5. Permanent Nail Avulsion.  Removing the entire nail so that a new nail will not grow back. 

## 2018-10-16 NOTE — Progress Notes (Signed)
Subjective: Robert [name] is a 68 y.o. y.o. male who presents today with cc of painful, discolored, thick toenails and calluses b/l feet which interfere with daily activities. Pain is aggravated when weightbearing and wearing enclosed shoe gear and relieved with periodic professional debridement.  Fayrene Helper, MD is his PCP.    Current Outpatient Medications:  .  ACCU-CHEK FASTCLIX LANCETS MISC, Use up to three times daily Dx e11.22, Disp: 300 each, Rfl: 1 .  aspirin 81 MG tablet, Take 81 mg by mouth daily.  , Disp: , Rfl:  .  atorvastatin (LIPITOR) 40 MG tablet, Take 1 tablet (40 mg total) by mouth daily., Disp: 90 tablet, Rfl: 2 .  Blood Glucose Monitoring Suppl (ACCU-CHEK NANO SMARTVIEW) w/Device KIT, TEST 1 TO 2 TIMES DAILY, Disp: 1 kit, Rfl: 0 .  glipiZIDE (GLUCOTROL) 10 MG tablet, Take 1 tablet (10 mg total) by mouth daily before breakfast., Disp: 90 tablet, Rfl: 3 .  glucose blood (ACCU-CHEK SMARTVIEW) test strip, Use as instructed three times daily DX e11.9, Disp: 300 each, Rfl: 1 .  Insulin Detemir (LEVEMIR) 100 UNIT/ML Pen, 85 units once daily, Disp: 45 mL, Rfl: 5 .  Insulin Pen Needle (B-D ULTRAFINE III SHORT PEN) 31G X 8 MM MISC, USE AS DIRECTED WITH LEVEMIR FLEXPEN daily, Disp: 100 each, Rfl: 5 .  Insulin Syringe-Needle U-100 (INSULIN SYRINGE .5CC/30GX5/16") 30G X 5/16" 0.5 ML MISC, Use as directed with novolog 70/30, Disp: 100 each, Rfl: 3 .  metoprolol succinate (TOPROL-XL) 50 MG 24 hr tablet, TAKE 1 TABLET DAILY WITH OR IMMEDIATELY AFTER A MEAL, Disp: 90 tablet, Rfl: 2 .  olmesartan (BENICAR) 20 MG tablet, TAKE 1 TABLET DAILY (DOSE REDUCTION), Disp: 90 tablet, Rfl: 2  Allergies  Allergen Reactions  . Rosuvastatin Other (See Comments)    Adverse GI symptoms    Objective: Vitals:   10/16/18 0836  Temp: 98.1 F (36.7 C)    Vascular Examination: Capillary refill time immediate x 10 digits.  Dorsalis pedis pulses 1/4 b/l.  Posterior tibial pulses  absent.  Digital hair absent x 10 digits.  Skin temperature gradient WNL b/l.  Dermatological Examination: Skin with normal turgor, texture and tone b/l.  Toenails 1-5 b/l discolored, thick, dystrophic with subungual debris and pain with palpation to nailbeds due to thickness of nails.  Porokeratotic lesions submet head 5 b/l with tenderness to palpation. No erythema, no edema, no drainage, no flocculence.   Musculoskeletal: Muscle strength 5/5 b/l to all LE muscle groups.  Neurological: Sensation intact 5/5 b/l with 10 gram monofilament.  Vibratory sensation intact b/l.  Assessment: 1.  Painful onychomycosis toenails 1-5 b/l 2.  Porokeratosis submet head 5 b/l 3.  NIDDM with PAD  Plan: 1. Continue diabetic foot care principles. Literature dispensed on today. 2. Toenails 1-5 b/l were debrided in length and girth without iatrogenic bleeding. 3. Porokeratosis submet head 5 b/l pared and enucleated with sterile scalpel blade without incident.   4. Patient to continue soft, supportive shoe gear daily. 5. Patient to report any pedal injuries to medical professional immediately. 6. Follow up 10 weeks.  7. Patient/POA to call should there be a concern in the interim.

## 2018-10-18 ENCOUNTER — Other Ambulatory Visit: Payer: Self-pay | Admitting: *Deleted

## 2018-10-18 DIAGNOSIS — Z20822 Contact with and (suspected) exposure to covid-19: Secondary | ICD-10-CM

## 2018-10-20 LAB — NOVEL CORONAVIRUS, NAA: SARS-CoV-2, NAA: NOT DETECTED

## 2018-10-25 ENCOUNTER — Encounter: Payer: Self-pay | Admitting: *Deleted

## 2018-11-04 DIAGNOSIS — E559 Vitamin D deficiency, unspecified: Secondary | ICD-10-CM | POA: Diagnosis not present

## 2018-11-04 DIAGNOSIS — E1122 Type 2 diabetes mellitus with diabetic chronic kidney disease: Secondary | ICD-10-CM | POA: Diagnosis not present

## 2018-11-04 DIAGNOSIS — E1159 Type 2 diabetes mellitus with other circulatory complications: Secondary | ICD-10-CM | POA: Diagnosis not present

## 2018-11-04 DIAGNOSIS — I129 Hypertensive chronic kidney disease with stage 1 through stage 4 chronic kidney disease, or unspecified chronic kidney disease: Secondary | ICD-10-CM | POA: Diagnosis not present

## 2018-11-05 ENCOUNTER — Ambulatory Visit (INDEPENDENT_AMBULATORY_CARE_PROVIDER_SITE_OTHER): Payer: Medicare Other | Admitting: Family Medicine

## 2018-11-05 ENCOUNTER — Other Ambulatory Visit: Payer: Self-pay

## 2018-11-05 ENCOUNTER — Encounter: Payer: Self-pay | Admitting: Family Medicine

## 2018-11-05 VITALS — BP 120/70 | HR 63 | Temp 98.2°F | Resp 15 | Ht 69.0 in | Wt 222.0 lb

## 2018-11-05 DIAGNOSIS — G8929 Other chronic pain: Secondary | ICD-10-CM

## 2018-11-05 DIAGNOSIS — Z0000 Encounter for general adult medical examination without abnormal findings: Secondary | ICD-10-CM

## 2018-11-05 DIAGNOSIS — E1159 Type 2 diabetes mellitus with other circulatory complications: Secondary | ICD-10-CM

## 2018-11-05 DIAGNOSIS — M25561 Pain in right knee: Secondary | ICD-10-CM | POA: Diagnosis not present

## 2018-11-05 LAB — COMPLETE METABOLIC PANEL WITH GFR
AG Ratio: 1.3 (calc) (ref 1.0–2.5)
ALT: 19 U/L (ref 9–46)
AST: 18 U/L (ref 10–35)
Albumin: 3.9 g/dL (ref 3.6–5.1)
Alkaline phosphatase (APISO): 124 U/L (ref 35–144)
BUN: 13 mg/dL (ref 7–25)
CO2: 26 mmol/L (ref 20–32)
Calcium: 9.3 mg/dL (ref 8.6–10.3)
Chloride: 106 mmol/L (ref 98–110)
Creat: 1.09 mg/dL (ref 0.70–1.25)
GFR, Est African American: 82 mL/min/{1.73_m2} (ref >=60)
GFR, Est Non African American: 70 mL/min/{1.73_m2} (ref >=60)
Globulin: 3 g/dL (calc) (ref 1.9–3.7)
Glucose, Bld: 119 mg/dL — ABNORMAL HIGH (ref 65–99)
Potassium: 4.6 mmol/L (ref 3.5–5.3)
Sodium: 141 mmol/L (ref 135–146)
Total Bilirubin: 0.4 mg/dL (ref 0.2–1.2)
Total Protein: 6.9 g/dL (ref 6.1–8.1)

## 2018-11-05 LAB — HEMOGLOBIN A1C
Hgb A1c MFr Bld: 9.3 % of total Hgb — ABNORMAL HIGH (ref <5.7)
Mean Plasma Glucose: 220 (calc)
eAG (mmol/L): 12.2 (calc)

## 2018-11-05 LAB — LIPID PANEL
Cholesterol: 117 mg/dL (ref <200)
HDL: 31 mg/dL — ABNORMAL LOW (ref >=40)
LDL Cholesterol (Calc): 61 mg/dL (calc)
Non-HDL Cholesterol (Calc): 86 mg/dL (calc) (ref <130)
Total CHOL/HDL Ratio: 3.8 (calc) (ref <5.0)
Triglycerides: 178 mg/dL — ABNORMAL HIGH (ref <150)

## 2018-11-05 LAB — VITAMIN D 25 HYDROXY (VIT D DEFICIENCY, FRACTURES): Vit D, 25-Hydroxy: 26 ng/mL — ABNORMAL LOW (ref 30–100)

## 2018-11-05 LAB — PSA: PSA: 1.9 ng/mL (ref <=4.0)

## 2018-11-05 LAB — TSH: TSH: 1.76 mIU/L (ref 0.40–4.50)

## 2018-11-05 MED ORDER — SILDENAFIL CITRATE 100 MG PO TABS: 100.0000 mg | ORAL_TABLET | Freq: Every day | ORAL | 3 refills | Status: DC | PRN

## 2018-11-05 MED ORDER — ACCU-CHEK SMARTVIEW VI STRP: ORAL_STRIP | 1 refills | Status: DC

## 2018-11-05 NOTE — Progress Notes (Signed)
Robert [name]     MRN: [id number]      DOB: 08-31-1950   HPI: Patient is in for annual physical exam. Increased localized right knee pain on medical aspect x 9 months, aggravated by certain movements Recent labs,  are reviewed.diabetic education done for 10 minutes and he is referred to class. He is non compliant with diet, drinking juice, soda, eating cookies and ice cream and does not test regularly.States this am blood sugar is 104 Immunization is reviewed     PE; BP 120/70   Pulse 63   Temp 98.2 F (36.8 C) (Temporal)   Resp 15   Ht 5\' 9"  (1.753 m)   Wt 222 lb (100.7 kg)   SpO2 94%   BMI 32.78 kg/m   Pleasant male, alert and oriented x 3, in no cardio-pulmonary distress. Afebrile. HEENT No facial trauma or asymetry. . EOMI, t. External ears normal,   Neck: supple, no adenopathy,JVD or thyromegaly.No bruits.  Chest: Clear to ascultation bilaterally.No crackles or wheezes. Non tender to palpation   Cardiovascular system; Heart sounds normal,  S1 and  S2 ,no S3.  No murmur, or thrill. Apical beat not displaced Peripheral pulses normal.  Abdomen: Soft, non tender, No guarding, tenderness or rebound.    Musculoskeletal exam: Full ROM of spine, hips , shoulders and reduced in right  Knee tender on medial right knee.  deformity ,swelling  And  crepitus noted.right knee No muscle wasting or atrophy.   Neurologic: Cranial nerves 2 to 12 intact. Power, tone ,sensation and reflexes normal throughout. No disturbance in gait. No tremor.  Skin: Intact, no ulceration, erythema , scaling or rash noted. Pigmentation normal throughout  Psych; Normal mood and affect. Judgement and concentration normal   Assessment & Plan:  Annual physical exam Annual exam as documented. Counseling done  re healthy lifestyle involving commitment to 150 minutes exercise per week, heart healthy diet, and attaining healthy weight.The importance of adequate sleep also discussed.  Regular seat belt use and home safety, is also discussed. Changes in health habits are decided on by the patient with goals and time frames  set for achieving them. Immunization and cancer screening needs are specifically addressed at this visit.   Type 2 diabetes mellitus with vascular disease (Robert [name])  Deteriorated and uncontrolled, non compliant with management plan No med change, return in 5 weeks with meter and log. Test and record 3 times daily Robert [name] is reminded of the importance of commitment to daily physical activity for 30 minutes or more, as able and the need to limit carbohydrate intake to 30 to 60 grams per meal to help with blood sugar control.   The need to take medication as prescribed, test blood sugar as directed, and to call between visits if there is a concern that blood sugar is uncontrolled is also discussed.   Robert [name] is reminded of the importance of daily foot exam, annual eye examination, and good blood sugar, blood pressure and cholesterol control.  Diabetic Labs Latest Ref Rng & Units 11/04/2018 07/04/2018 04/02/2018 02/26/2018 12/26/2017  HbA1c <5.7 % of total Hgb 9.3(H) 7.5(H) 8.1(H) - 8.6(H)  Microalbumin mg/dL - - - 8.0 -  Micro/Creat Ratio <30 mcg/mg creat - - - 17 -  Chol <200 mg/dL 117 - 116 - -  HDL > OR = 40 mg/dL 31(L) - 39(L) - -  Calc LDL mg/dL (calc) 61 - 59 - -  Triglycerides <150 mg/dL 178(H) - 97 - -  Creatinine  0.70 - 1.25 mg/dL 1.09 1.47(H) 1.32(H) - -   BP/Weight 11/05/2018 08/14/2018 07/09/2018 03/20/2018 03/05/2018 11/12/2017 7/42/5956  Systolic BP 387 564 332 951 884 166 063  Diastolic BP 70 86 82 80 80 80 80  Wt. (Lbs) 222 225 224 223.7 223.12 224 223  BMI 32.78 33.23 33.08 33.03 32.95 33.08 32.93   Foot/eye exam completion dates Latest Ref Rng & Units 11/05/2018 05/30/2018  Eye Exam No Retinopathy - No Retinopathy  Foot exam Order - - -  Foot Form Completion - Done -        Knee pain, right 9 month h/o progressively worsening and  debilitatin localized pain on medical aspect, refer Ortho

## 2018-11-05 NOTE — Patient Instructions (Signed)
Follow-up with MD with meter and log blood sugar log in 5 weeks call if you need me sooner.  Blood pressure cholesterol kidney and liver function are all excellent.  Blood sugar is uncontrolled and I believe this is because of poor food choices.  Please commits his discontinuing juices sweet drinks ice cream and cookies.  You are referred to diabetic educator it is important that you go.( please also give patient a blood sugar log book)  Test blood sugars 3 times every day and record.  Fasting blood sugar range 90 to 130  2 hours after lunch range 1 30-1 80.  Bedtime range 1 30-1 80.  If blood sugar at bedtime is less than 130 please have 2 peanut butter crackers as a snack.  You are referred to orthopedics for right knee pain.  Think about what you will eat, plan ahead. Choose " clean, green, fresh or frozen" over canned, processed or packaged foods which are more sugary, salty and fatty. 70 to 75% of food eaten should be vegetables and fruit. Three meals at set times with snacks allowed between meals, but they must be fruit or vegetables. Aim to eat over a 12 hour period , example 7 am to 7 pm, and STOP after  your last meal of the day. Drink water,generally about 64 ounces per day, no other drink is as healthy. Fruit juice is best enjoyed in a healthy way, by EATING the fruit.  Thanks for choosing [facility], we consider it a privelige to serve you.

## 2018-11-05 NOTE — Assessment & Plan Note (Signed)
Deteriorated and uncontrolled, non compliant with management plan No med change, return in 5 weeks with meter and log. Test and record 3 times daily Robert [name] is reminded of the importance of commitment to daily physical activity for 30 minutes or more, as able and the need to limit carbohydrate intake to 30 to 60 grams per meal to help with blood sugar control.   The need to take medication as prescribed, test blood sugar as directed, and to call between visits if there is a concern that blood sugar is uncontrolled is also discussed.   Robert [name] is reminded of the importance of daily foot exam, annual eye examination, and good blood sugar, blood pressure and cholesterol control.  Diabetic Labs Latest Ref Rng & Units 11/04/2018 07/04/2018 04/02/2018 02/26/2018 12/26/2017  HbA1c <5.7 % of total Hgb 9.3(H) 7.5(H) 8.1(H) - 8.6(H)  Microalbumin mg/dL - - - 8.0 -  Micro/Creat Ratio <30 mcg/mg creat - - - 17 -  Chol <200 mg/dL 117 - 116 - -  HDL > OR = 40 mg/dL 31(L) - 39(L) - -  Calc LDL mg/dL (calc) 61 - 59 - -  Triglycerides <150 mg/dL 178(H) - 97 - -  Creatinine 0.70 - 1.25 mg/dL 1.09 1.47(H) 1.32(H) - -   BP/Weight 11/05/2018 08/14/2018 07/09/2018 03/20/2018 03/05/2018 11/12/2017 09/30/5282  Systolic BP 132 440 102 725 366 440 347  Diastolic BP 70 86 82 80 80 80 80  Wt. (Lbs) 222 225 224 223.7 223.12 224 223  BMI 32.78 33.23 33.08 33.03 32.95 33.08 32.93   Foot/eye exam completion dates Latest Ref Rng & Units 11/05/2018 05/30/2018  Eye Exam No Retinopathy - No Retinopathy  Foot exam Order - - -  Foot Form Completion - Done -

## 2018-11-05 NOTE — Assessment & Plan Note (Signed)
9 month h/o progressively worsening and debilitatin localized pain on medical aspect, refer Ortho

## 2018-11-05 NOTE — Assessment & Plan Note (Signed)

## 2018-11-08 DIAGNOSIS — M25561 Pain in right knee: Secondary | ICD-10-CM | POA: Diagnosis not present

## 2018-11-10 ENCOUNTER — Encounter: Payer: Self-pay | Admitting: Family Medicine

## 2018-11-14 ENCOUNTER — Ambulatory Visit: Payer: Medicare Other | Admitting: Family Medicine

## 2018-11-20 ENCOUNTER — Ambulatory Visit (INDEPENDENT_AMBULATORY_CARE_PROVIDER_SITE_OTHER): Payer: Medicare Other | Admitting: Family Medicine

## 2018-11-20 ENCOUNTER — Encounter: Payer: Self-pay | Admitting: Family Medicine

## 2018-11-20 ENCOUNTER — Other Ambulatory Visit: Payer: Self-pay

## 2018-11-20 VITALS — BP 120/70 | HR 63 | Resp 15 | Ht 69.0 in | Wt 222.0 lb

## 2018-11-20 DIAGNOSIS — Z0000 Encounter for general adult medical examination without abnormal findings: Secondary | ICD-10-CM

## 2018-11-20 NOTE — Progress Notes (Signed)
Subjective:   Robert [name] is a 68 y.o. male who presents for Medicare Annual/Subsequent preventive examination.  Location of Patient: Home Location of Provider: Telehealth Consent was obtain for visit to be over via telehealth. I verified that I am speaking with the correct person using two identifiers.   Review of Systems:    Cardiac Risk Factors include: advanced age (>10mn, >>51women);diabetes mellitus;dyslipidemia;hypertension     Objective:    Vitals: BP 120/70   Pulse 63   Resp 15   Ht _0  (1.753 m)   Wt 222 lb (100.7 kg)   BMI 32.78 kg/m   Body mass index is 32.78 kg/m.  Advanced Directives 11/12/2017 02/01/2016 11/11/2014  Does Patient Have a Medical Advance Directive? Yes No No  Does patient want to make changes to medical advance directive? Yes (ED - Information included in AVS) - -  Would patient like information on creating a medical advance directive? - - Yes - Educational materials given    Tobacco Social History   Tobacco Use  Smoking Status Former Smoker  . Packs/day: 1.00  . Years: 30.00  . Pack years: 30.00  . Types: Cigarettes  . Start date: 04/03/1966  . Quit date: 09/02/1983  . Years since quitting: 35.2  Smokeless Tobacco Never Used     Counseling given: Yes   Clinical Intake:  Pre-visit preparation completed: Yes  Pain : No/denies pain Pain Score: 0-No pain     BMI - recorded: 32.78 Nutritional Status: BMI > 30  Obese Nutritional Risks: None Diabetes: Yes CBG done?: No Did pt. bring in CBG monitor from home?: No  How often do you need to have someone help you when you read instructions, pamphlets, or other written materials from your doctor or pharmacy?: 1 - Never What is the last grade level you completed in school?: mechanical degree  Interpreter Needed?: No     Past Medical History:  Diagnosis Date  . Arteriosclerotic cardiovascular disease (ASCVD)    [place]1 [place]  totally obstructed RCA at that time; EF of 45%; 12/2009: Equivocal stress nuclear with good exercise tolerance, negative stress EKG, normal EF with mild mid and distal inferior ischemia  . Coronary artery disease   . Diabetes mellitus, insulin dependent (IDDM), [place] 10[place]   HBA1C is 6.9 on 10/22/2012   . [place]   . Hyperlipidemia    Lipid profile in 09/2011:128, 130, 33, 69  . Hypertension    Lab  09/2011: Normal CMet ex G-133  . Tobacco abuse, in remission    30 pack years; quit in 1985   Past Surgical History:  Procedure Laterality Date  . CARDIAC CATHETERIZATION    . COLONOSCOPY  2010   Negative screening study  . COLONOSCOPY N/A 11/11/2014   Procedure: COLONOSCOPY;  Surgeon: NRogene Houston MD;  [facility];  Service: Endoscopy;  Laterality: N/A;  1030  . EPIDIDYMIS SURGERY  1996  . KNEE ARTHROSCOPY W/ MENISCECTOMY  11/2008   Right  . PRESSURE ULCER DEBRIDEMENT  2006   Right lower extremity  . ROTATOR CUFF REPAIR  07/2009   Right   Family History  Problem Relation Age of Onset  . Diabetes Sister   . Arthritis Brother   . Diabetes Brother   . Arthritis Mother   . Cancer Father        Hypernephroma   Social History   Socioeconomic History  .  Marital status: Married    Spouse name: Not on file  . Number of children: 2  . Years of education: Not on file  . Highest education level: Some college, no degree  Occupational History  . Occupation: Retired  Scientific laboratory technician  . Financial resource strain: Not hard at all  . Food insecurity    Worry: Never true    Inability: Never true  . Transportation needs    Medical: No    Non-medical: No  Tobacco Use  . Smoking status: Former Smoker    Packs/day: 1.00    Years: 30.00    Pack years: 30.00    Types: Cigarettes    Start date: 04/03/1966    Quit date: 09/02/1983    Years since quitting: 35.2  . Smokeless tobacco: Never Used  Substance and Sexual Activity  . Alcohol use: No     Alcohol/week: 0.0 standard drinks    Comment: Former Tax inspector -discontinued use in 2005  . Drug use: No  . Sexual activity: Yes  Lifestyle  . Physical activity    Days per week: 3 days    Minutes per session: 60 min  . Stress: Not at all  Relationships  . Social connections    Talks on phone: More than three times a week    Gets together: More than three times a week    Attends religious service: More than 4 times per year    Active member of club or organization: No    Attends meetings of clubs or organizations: Never    Relationship status: Married  Other Topics Concern  . Not on file  Social History Narrative  . Not on file    Outpatient Encounter Medications as of 11/20/2018  Medication Sig  . ACCU-CHEK FASTCLIX LANCETS MISC Use up to three times daily Dx e11.22  . aspirin 81 MG tablet Take 81 mg by mouth daily.    Marland Kitchen atorvastatin (LIPITOR) 40 MG tablet Take 1 tablet (40 mg total) by mouth daily.  . Blood Glucose Monitoring Suppl (ACCU-CHEK NANO SMARTVIEW) w/Device KIT TEST 1 TO 2 TIMES DAILY  . glipiZIDE (GLUCOTROL) 10 MG tablet Take 1 tablet (10 mg total) by mouth daily before breakfast.  . glucose blood (ACCU-CHEK SMARTVIEW) test strip Use as instructed three times daily DX e11.9  . Insulin Detemir (LEVEMIR) 100 UNIT/ML Pen 85 units once daily  . Insulin Pen Needle (B-D ULTRAFINE III SHORT PEN) 31G X 8 MM MISC USE AS DIRECTED WITH LEVEMIR FLEXPEN daily  . Insulin Syringe-Needle U-100 (INSULIN SYRINGE .5CC/30GX5/16") 30G X 5/16" 0.5 ML MISC Use as directed with novolog 70/30  . metoprolol succinate (TOPROL-XL) 50 MG 24 hr tablet TAKE 1 TABLET DAILY WITH OR IMMEDIATELY AFTER A MEAL  . olmesartan (BENICAR) 20 MG tablet TAKE 1 TABLET DAILY (DOSE REDUCTION)  . sildenafil (VIAGRA) 100 MG tablet Take 1 tablet (100 mg total) by mouth daily as needed for erectile dysfunction.   No facility-administered encounter medications on file as of 11/20/2018.     Activities of Daily  Living In your present state of health, do you have any difficulty performing the following activities: 11/20/2018  Hearing? N  Vision? N  Difficulty concentrating or making decisions? N  Walking or climbing stairs? N  Dressing or bathing? N  Doing errands, shopping? N  Preparing Food and eating ? N  Using the Toilet? N  In the past six months, have you accidently leaked urine? N  Do you have problems with loss  of bowel control? N  Managing your Medications? N  Managing your Finances? N  Housekeeping or managing your Housekeeping? N  Some recent data might be hidden    Patient Care Team: Fayrene Helper, MD as PCP - [facility], Alphonse Guild, MD as PCP - Cardiology (Cardiology)   Assessment:   This is a routine wellness examination for [place].  Exercise Activities and Dietary recommendations Current Exercise Habits: The patient does not participate in regular exercise at present, Exercise limited by: orthopedic condition(s)  Goals   None     Fall Risk Fall Risk  11/20/2018 11/05/2018 07/09/2018 03/05/2018 11/12/2017  Falls in the past year? 0 0 0 0 No  Number falls in past yr: 0 0 0 - -  Injury with Fall? 0 0 0 - -  Risk for fall due to : - - - - -   Is the patient's home free of loose throw rugs in walkways, pet beds, electrical cords, etc?   yes      Grab bars in the bathroom? yes      Handrails on the stairs?   yes      Adequate lighting?   yes     Depression Screen PHQ 2/9 Scores 11/20/2018 11/05/2018 07/09/2018 03/05/2018  PHQ - 2 Score 0 0 0 0  PHQ- 9 Score - - - 3    Cognitive Function     6CIT Screen 11/20/2018 11/12/2017  What Year? 0 points 0 points  What month? 0 points 0 points  What time? 0 points 0 points  Count back from 20 0 points -  Months in reverse 0 points -  Repeat phrase 0 points -  Total Score 0 -    Immunization History  Administered Date(s) Administered  . Influenza Split 02/14/2011, 12/18/2011  . Influenza Whole 12/25/2006,  12/23/2008, 01/13/2010  . Influenza,inj,Quad PF,6+ Mos 01/10/2013, 12/02/2013, 01/26/2015, 12/09/2015, 11/30/2016, 12/31/2017  . Pneumococcal Conjugate-13 11/10/2013  . Pneumococcal Polysaccharide-23 03/10/2004, 01/13/2010, 11/30/2016  . Tdap 12/18/2011  . Zoster 12/18/2011    Qualifies for Shingles Vaccine?  completed  Screening Tests Health Maintenance  Topic Date Due  . INFLUENZA VACCINE  11/02/2018  . HEMOGLOBIN A1C  05/07/2019  . OPHTHALMOLOGY EXAM  05/31/2019  . FOOT EXAM  11/05/2019  . TETANUS/TDAP  12/17/2021  . COLONOSCOPY  11/10/2024  . Hepatitis C Screening  Completed  . PNA vac Low Risk Adult  Completed   Cancer Screenings: Lung: Low Dose CT Chest recommended if Age 86-80 years, 30 pack-year currently smoking OR have quit w/in 15years. Patient does not qualify. Colorectal:  Due 2026 Additional Screenings:  Hepatitis C Screening: completed      Plan:     1. Encounter for Medicare annual wellness exam  I have personally reviewed and noted the following in the patient's chart:   . Medical and social history . Use of alcohol, tobacco or illicit drugs  . Current medications and supplements . Functional ability and status . Nutritional status . Physical activity . Advanced directives . List of other physicians . Hospitalizations, surgeries, and ER visits in previous 12 months . Vitals . Screenings to include cognitive, depression, and falls . Referrals and appointments  In addition, I have reviewed and discussed with patient certain preventive protocols, quality metrics, and best practice recommendations. A written personalized care plan for preventive services as well as general preventive health recommendations were provided to patient.     I provided 20 minutes of non-face-to-face time during this encounter.  Perlie Mayo, NP  11/20/2018

## 2018-11-20 NOTE — Patient Instructions (Signed)
Robert [name] , Thank you for taking time to come for your Medicare Wellness Visit. I appreciate your ongoing commitment to your health goals. Please review the following plan we discussed and let me know if I can assist you in the future.   Please continue to practice social distancing to keep you, your family, and our community safe.  If you must go out, please wear a Mask and practice good handwashing.  Screening recommendations/referrals: Colonoscopy: Due 2026 Recommended yearly ophthalmology/optometry visit for glaucoma screening and checkup Recommended yearly dental visit for hygiene and checkup  Vaccinations: Influenza vaccine: Due Fall 2020 Pneumococcal vaccine: Completed Tdap vaccine: Due 2023 Shingles vaccine: Completed  Advanced directives:    Conditions/risks identified: Fall   Next appointment: 12/12/2018  Preventive Care 24 Years and Older, Male Preventive care refers to lifestyle choices and visits with your health care provider that can promote health and wellness. What does preventive care include?  A yearly physical exam. This is also called an annual well check.  Dental exams once or twice a year.  Routine eye exams. Ask your health care provider how often you should have your eyes checked.  Personal lifestyle choices, including:  Daily care of your teeth and gums.  Regular physical activity.  Eating a healthy diet.  Avoiding tobacco and drug use.  Limiting alcohol use.  Practicing safe sex.  Taking low doses of aspirin every day.  Taking vitamin and mineral supplements as recommended by your health care provider. What happens during an annual well check? The services and screenings done by your health care provider during your annual well check will depend on your age, overall health, lifestyle risk factors, and family history of disease. Counseling  Your health care provider may ask you questions about your:  Alcohol use.  Tobacco use.  Drug  use.  Emotional well-being.  Home and relationship well-being.  Sexual activity.  Eating habits.  History of falls.  Memory and ability to understand (cognition).  Work and work Statistician. Screening  You may have the following tests or measurements:  Height, weight, and BMI.  Blood pressure.  Lipid and cholesterol levels. These may be checked every 5 years, or more frequently if you are over 33 years old.  Skin check.  Lung cancer screening. You may have this screening every year starting at age 46 if you have a 30-pack-year history of smoking and currently smoke or have quit within the past 15 years.  Fecal occult blood test (FOBT) of the stool. You may have this test every year starting at age 91.  Flexible sigmoidoscopy or colonoscopy. You may have a sigmoidoscopy every 5 years or a colonoscopy every 10 years starting at age 78.  Prostate cancer screening. Recommendations will vary depending on your family history and other risks.  Hepatitis C blood test.  Hepatitis B blood test.  Sexually transmitted disease (STD) testing.  Diabetes screening. This is done by checking your blood sugar (glucose) after you have not eaten for a while (fasting). You may have this done every 1-3 years.  Abdominal aortic aneurysm (AAA) screening. You may need this if you are a current or former smoker.  Osteoporosis. You may be screened starting at age 50 if you are at high risk. Talk with your health care provider about your test results, treatment options, and if necessary, the need for more tests. Vaccines  Your health care provider may recommend certain vaccines, such as:  Influenza vaccine. This is recommended every year.  Tetanus,  diphtheria, and acellular pertussis (Tdap, Td) vaccine. You may need a Td booster every 10 years.  Zoster vaccine. You may need this after age 72.  Pneumococcal 13-valent conjugate (PCV13) vaccine. One dose is recommended after age 58.   Pneumococcal polysaccharide (PPSV23) vaccine. One dose is recommended after age 25. Talk to your health care provider about which screenings and vaccines you need and how often you need them. This information is not intended to replace advice given to you by your health care provider. Make sure you discuss any questions you have with your health care provider. Document Released: 04/16/2015 Document Revised: 12/08/2015 Document Reviewed: 01/19/2015 Elsevier Interactive Patient Education  2017 [place] Prevention in the Home Falls can cause injuries. They can happen to people of all ages. There are many things you can do to make your home safe and to help prevent falls. What can I do on the outside of my home?  Regularly fix the edges of walkways and driveways and fix any cracks.  Remove anything that might make you trip as you walk through a door, such as a raised step or threshold.  Trim any bushes or trees on the path to your home.  Use bright outdoor lighting.  Clear any walking paths of anything that might make someone trip, such as rocks or tools.  Regularly check to see if handrails are loose or broken. Make sure that both sides of any steps have handrails.  Any raised decks and porches should have guardrails on the edges.  Have any leaves, snow, or ice cleared regularly.  Use sand or salt on walking paths during winter.  Clean up any spills in your garage right away. This includes oil or grease spills. What can I do in the bathroom?  Use night lights.  Install grab bars by the toilet and in the tub and shower. Do not use towel bars as grab bars.  Use non-skid mats or decals in the tub or shower.  If you need to sit down in the shower, use a plastic, non-slip stool.  Keep the floor dry. Clean up any water that spills on the floor as soon as it happens.  Remove soap buildup in the tub or shower regularly.  Attach bath mats securely with double-sided non-slip  rug tape.  Do not have throw rugs and other things on the floor that can make you trip. What can I do in the bedroom?  Use night lights.  Make sure that you have a light by your bed that is easy to reach.  Do not use any sheets or blankets that are too big for your bed. They should not hang down onto the floor.  Have a firm chair that has side arms. You can use this for support while you get dressed.  Do not have throw rugs and other things on the floor that can make you trip. What can I do in the kitchen?  Clean up any spills right away.  Avoid walking on wet floors.  Keep items that you use a lot in easy-to-reach places.  If you need to reach something above you, use a strong step stool that has a grab bar.  Keep electrical cords out of the way.  Do not use floor polish or wax that makes floors slippery. If you must use wax, use non-skid floor wax.  Do not have throw rugs and other things on the floor that can make you trip. What can I do with  my stairs?  Do not leave any items on the stairs.  Make sure that there are handrails on both sides of the stairs and use them. Fix handrails that are broken or loose. Make sure that handrails are as long as the stairways.  Check any carpeting to make sure that it is firmly attached to the stairs. Fix any carpet that is loose or worn.  Avoid having throw rugs at the top or bottom of the stairs. If you do have throw rugs, attach them to the floor with carpet tape.  Make sure that you have a light switch at the top of the stairs and the bottom of the stairs. If you do not have them, ask someone to add them for you. What else can I do to help prevent falls?  Wear shoes that:  Do not have high heels.  Have rubber bottoms.  Are comfortable and fit you well.  Are closed at the toe. Do not wear sandals.  If you use a stepladder:  Make sure that it is fully opened. Do not climb a closed stepladder.  Make sure that both sides of  the stepladder are locked into place.  Ask someone to hold it for you, if possible.  Clearly mark and make sure that you can see:  Any grab bars or handrails.  First and last steps.  Where the edge of each step is.  Use tools that help you move around (mobility aids) if they are needed. These include:  Canes.  Walkers.  Scooters.  Crutches.  Turn on the lights when you go into a dark area. Replace any light bulbs as soon as they burn out.  Set up your furniture so you have a clear path. Avoid moving your furniture around.  If any of your floors are uneven, fix them.  If there are any pets around you, be aware of where they are.  Review your medicines with your doctor. Some medicines can make you feel dizzy. This can increase your chance of falling. Ask your doctor what other things that you can do to help prevent falls. This information is not intended to replace advice given to you by your health care provider. Make sure you discuss any questions you have with your health care provider. Document Released: 01/14/2009 Document Revised: 08/26/2015 Document Reviewed: 04/24/2014 Elsevier Interactive Patient Education  2017 [employer].

## 2018-11-27 ENCOUNTER — Encounter: Payer: Medicare Other | Attending: Family Medicine | Admitting: Nutrition

## 2018-11-27 ENCOUNTER — Encounter: Payer: Self-pay | Admitting: Nutrition

## 2018-11-27 ENCOUNTER — Other Ambulatory Visit: Payer: Self-pay

## 2018-11-27 VITALS — Ht 69.0 in | Wt 212.0 lb

## 2018-11-27 DIAGNOSIS — E669 Obesity, unspecified: Secondary | ICD-10-CM | POA: Insufficient documentation

## 2018-11-27 DIAGNOSIS — E785 Hyperlipidemia, unspecified: Secondary | ICD-10-CM

## 2018-11-27 DIAGNOSIS — E1159 Type 2 diabetes mellitus with other circulatory complications: Secondary | ICD-10-CM | POA: Insufficient documentation

## 2018-11-27 NOTE — Progress Notes (Signed)
  Medical Nutrition Therapy:  Appt start time: [id number] end time:  1630.   Assessment:  Primary concerns today: Diabetes Type 2,  Lives with his wife. Most meals are cooked at home. Eats out some. Reduced insulin to 70 units of Levemir  Due to low blood sugars of 65 mg/dl. Taking 1 glipizide BID. FBS; 80-100's Bedtimes: less than 180's Plays golf most days of week. Physical active and in good physical shape. Had been eating snacks between meals and drinking juices and gatorades and eating nabs.   Lab Results  Component Value Date   HGBA1C 9.3 (H) 11/04/2018    Preferred Learning Style:   Auditory  Visual  Hands on  Learning Readiness:   Ready  Change in progress   MEDICATIONS:    DIETARY INTAKE:    24-hr recall:  B ( AM): 1-2 eggs, 2 waffles,  Snk ( AM): L ( PM): baked chicken, cabbage, rice casserole  3 cups, water... Snk ( PM): D ( PM): same as lunch.  Snk ( PM):  Beverages: water  Usual physical activity: plays golf.   Estimated energy needs: 2200  calories 248  g carbohydrates 165 g protein 61 g fat  Progress Towards Goal(s):  In progress.   Nutritional Diagnosis:  NB-1.1 Food and nutrition-related knowledge deficit As related to Diabetes Type 2.  As evidenced by A1C 9.3%.    Intervention:  Nutrition and Diabetes education provided on My Plate, CHO counting, meal planning, portion sizes, timing of meals, avoiding snacks between meals unless having a low blood sugar, target ranges for A1C and blood sugars, signs/symptoms and treatment of hyper/hypoglycemia, monitoring blood sugars, taking medications as prescribed, benefits of exercising 30 minutes per day and prevention of complications of DM.  Goals Follow My Plate Eat [id number] grams of carbs per meal Drink gallon of water per day. Cut out snacks between meals unless low blood sugar. Increase lower carb vegetables with lunch and dinner Get A1C downt o 7% IF BS are less than 70 mg/dl 2-3 times in a  week, talk to Dr. Wandalee Ferdinand about stopping Glipizide..   Teaching Method Utilized: Visual Auditory Hands on  Handouts given during visit include:  Emailed The Plate Method, Meal Plan, Diabetes Instructions.   Barriers to learning/adherence to lifestyle change: none  Demonstrated degree of understanding via:  Teach Back   Monitoring/Evaluation:  Dietary intake, exercise, and body weight in 1 month(s).

## 2018-11-27 NOTE — Patient Instructions (Signed)
Goals Follow My Plate Eat [id number] grams of carbs per meal Drink gallon of water per day. Cut out snacks between meals unless low blood sugar. Increase lower carb vegetables with lunch and dinner Get A1C downt o 7% IF BS are less than 70 mg/dl 2-3 times in a week, talk to Dr. Wandalee Ferdinand about stopping Glipizide.Marland Kitchen

## 2018-12-12 ENCOUNTER — Ambulatory Visit: Payer: Medicare Other | Admitting: Family Medicine

## 2018-12-25 ENCOUNTER — Encounter: Payer: Self-pay | Admitting: Nutrition

## 2018-12-25 ENCOUNTER — Telehealth: Payer: Self-pay | Admitting: Nutrition

## 2018-12-25 ENCOUNTER — Other Ambulatory Visit: Payer: Self-pay

## 2018-12-25 ENCOUNTER — Encounter: Payer: Medicare Other | Attending: "Endocrinology | Admitting: Nutrition

## 2018-12-25 VITALS — Ht 69.0 in | Wt 214.0 lb

## 2018-12-25 DIAGNOSIS — E785 Hyperlipidemia, unspecified: Secondary | ICD-10-CM | POA: Insufficient documentation

## 2018-12-25 DIAGNOSIS — E669 Obesity, unspecified: Secondary | ICD-10-CM | POA: Diagnosis present

## 2018-12-25 DIAGNOSIS — E1159 Type 2 diabetes mellitus with other circulatory complications: Secondary | ICD-10-CM | POA: Diagnosis not present

## 2018-12-25 NOTE — Progress Notes (Signed)
  Medical Nutrition Therapy:  Appt start time: [id number] end time:  1600.  Assessment:  Primary concerns today: Diabetes Type 2,  Doing much better. Lives with his wife. Most meals are cooked at home. Eats out some. FBS 80-100 mg/dl.  BS at night  111-180's Levemir 70 units a nday, Reduced GLipizide 10 mg a day. One low BS of 65 and  Had skipped dinner. Sees Dr. Moshe Cipro.  30 day avg 114 mg/dl.  Reduced insulin to 70 units of Levemir  Due to low blood sugars of 65 mg/dl. Taking 1 glipizide FBS; 80-100's Bedtimes: less than 180's Plays golf most days of week. Physical active and in good physical shape.   Lab Results  Component Value Date   HGBA1C 9.3 (H) 11/04/2018    Preferred Learning Style:   Auditory  Visual  Hands on  Learning Readiness:   Ready  Change in progress   MEDICATIONS:    DIETARY INTAKE:    24-hr recall:  B ( AM): 1-2 eggs, 2 waffles,  Snk ( AM): L ( PM): Bologna sandwich and popcorn, water Snk ( PM): D ( PM): Vegetables soup, 6 crackers.water Snk ( PM):  Beverages: water  Usual physical activity: plays golf.   Estimated energy needs: 2200  calories 248  g carbohydrates 165 g protein 61 g fat  Progress Towards Goal(s):  In progress.   Nutritional Diagnosis:  NB-1.1 Food and nutrition-related knowledge deficit As related to Diabetes Type 2.  As evidenced by A1C 9.3%.    Intervention:  Nutrition and Diabetes education provided on My Plate, CHO counting, meal planning, portion sizes, timing of meals, avoiding snacks between meals unless having a low blood sugar, target ranges for A1C and blood sugars, signs/symptoms and treatment of hyper/hypoglycemia, monitoring blood sugars, taking medications as prescribed, benefits of exercising 30 minutes per day and prevention of complications of DM.   Goals Cut out bologna Eat 45-60 grams of carbs per meal  Let Dr. Moshe Cipro know if BS are less than 80's in more 3 times a week. Get A1C to 6.5% or  less.  Teaching Method Utilized: Visual Auditory Hands on  Handouts given during visit include:  Emailed The Plate Method, Meal Plan, Diabetes Instructions.   Barriers to learning/adherence to lifestyle change: none  Demonstrated degree of understanding via:  Teach Back   Monitoring/Evaluation:  Dietary intake, exercise, and body weight in 1 month(s).

## 2018-12-25 NOTE — Patient Instructions (Addendum)
Goal Cut out bologna Eat 45-60 grams of carbs per meal  Let Dr. Moshe Cipro know if BS are less than 80's in more 3 times a week. Get A1C to 6.5% or less.

## 2018-12-25 NOTE — Telephone Encounter (Signed)
vm left for office visit.

## 2019-01-01 ENCOUNTER — Encounter: Payer: Self-pay | Admitting: Family Medicine

## 2019-01-01 ENCOUNTER — Ambulatory Visit (INDEPENDENT_AMBULATORY_CARE_PROVIDER_SITE_OTHER): Payer: Medicare Other | Admitting: Family Medicine

## 2019-01-01 ENCOUNTER — Other Ambulatory Visit: Payer: Self-pay

## 2019-01-01 VITALS — BP 114/72 | HR 90 | Temp 98.3°F | Resp 15 | Ht 69.0 in | Wt 213.0 lb

## 2019-01-01 DIAGNOSIS — E785 Hyperlipidemia, unspecified: Secondary | ICD-10-CM | POA: Diagnosis not present

## 2019-01-01 DIAGNOSIS — Z23 Encounter for immunization: Secondary | ICD-10-CM

## 2019-01-01 DIAGNOSIS — E669 Obesity, unspecified: Secondary | ICD-10-CM

## 2019-01-01 DIAGNOSIS — E1159 Type 2 diabetes mellitus with other circulatory complications: Secondary | ICD-10-CM | POA: Diagnosis not present

## 2019-01-01 MED ORDER — INSULIN GLARGINE 100 UNIT/ML SOLOSTAR PEN: 70.0000 [IU] | PEN_INJECTOR | Freq: Every day | SUBCUTANEOUS | 1 refills | Status: DC

## 2019-01-01 NOTE — Progress Notes (Signed)
Robert [name]     MRN: [id number]      DOB: [birth year]   HPI Robert [name] is here for follow up and re-evaluation of chronic medical conditions, specifically uncontrolled diabetes medication management and review of any available recent lab and radiology data.  Preventive health is updated, specifically  Cancer screening and Immunization.   Questions or concerns regarding consultations or procedures which the PT has had in the interim are  Addressed.Seeing diabetic educator and has had great success with change in diet and improved blood sugars , to the extent that he ahs reduced insulin dose and has help bedtime glipizide dose at times duetohypoglycemia potential Diligently testing twice daily Playing golf for exercise There are no specific complaints   ROS Denies recent fever or chills. Denies sinus pressure, nasal congestion, ear pain or sore throat. Denies chest congestion, productive cough or wheezing. Denies chest pains, palpitations and leg swelling Denies abdominal pain, nausea, vomiting,diarrhea or constipation.   Denies dysuria, frequency, hesitancy or incontinence. Denies joint pain, swelling and limitation in mobility. Denies headaches, seizures, numbness, or tingling. Denies depression, anxiety or insomnia. Denies skin break down or rash.   PE  BP 114/72   Pulse 90   Temp 98.3 F (36.8 C) (Temporal)   Resp 15   Ht 5\' 9"  (1.753 m)   Wt 213 lb (96.6 kg)   SpO2 97%   BMI 31.45 kg/m   Patient alert and oriented and in no cardiopulmonary distress.  HEENT: No facial asymmetry, EOMI,   oropharynx pink and moist.  Neck supple no JVD, no mass.  Chest: Clear to auscultation bilaterally.  CVS: S1, S2 no murmurs, no S3.Regular rate.  ABD: Soft non tender.   Ext: No edema  MS: Adequate ROM spine, shoulders, hips and knees.  Skin: Intact, no ulcerations or rash noted.  Psych: Good eye contact, normal affect. Memory intact not anxious or depressed appearing.   CNS: CN 2-12 intact, power,  normal throughout.no focal deficits noted.   Assessment & Plan  Type 2 diabetes mellitus with vascular disease (HCC) Markedly improved, needs to reduce insulin dose , and toitrate down as needed. Change to lantus due to formulary [name] is reminded of the importance of commitment to daily physical activity for 30 minutes or more, as able and the need to limit carbohydrate intake to 30 to 60 grams per meal to help with blood sugar control.   The need to take medication as prescribed, test blood sugar as directed, and to call between visits if there is a concern that blood sugar is uncontrolled is also discussed.   [name] is reminded of the importance of daily foot exam, annual eye examination, and good blood sugar, blood pressure and cholesterol control.  Diabetic Labs Latest Ref Rng & Units 11/04/2018 07/04/2018 04/02/2018 02/26/2018 12/26/2017  HbA1c <5.7 % of total Hgb 9.3(H) 7.5(H) 8.1(H) - 8.6(H)  Microalbumin mg/dL - - - 8.0 -  Micro/Creat Ratio <30 mcg/mg creat - - - 17 -  Chol <200 mg/dL 117 - 116 - -  HDL > OR = 40 mg/dL 31(L) - 39(L) - -  Calc LDL mg/dL (calc) 61 - 59 - -  Triglycerides <150 mg/dL 178(H) - 97 - -  Creatinine 0.70 - 1.25 mg/dL 1.09 1.47(H) 1.32(H) - -   BP/Weight 01/01/2019 12/25/2018 11/27/2018 11/20/2018 11/05/2018 [id number]  Systolic BP 99991111 - - 123456 123456 [id number]  Diastolic BP 72 - - 70 70 86 82  Wt. (  Lbs) 213 214 212 222 222 225 224  BMI 31.45 31.6 31.31 32.78 32.78 33.23 33.08   Foot/eye exam completion dates Latest Ref Rng & Units 11/05/2018 05/30/2018  Eye Exam No Retinopathy - No Retinopathy  Foot exam Order - - -  Foot Form Completion - Done -        Obesity (BMI 30.0-34.9)  Patient re-educated about  the importance of commitment to a  minimum of 150 minutes of exercise per week as able.  The importance of healthy food choices with portion control discussed, as well as eating regularly and within a 12 hour  window most days. The need to choose "clean , green" food 50 to 75% of the time is discussed, as well as to make water the primary drink and set a goal of 64 ounces water daily.    Weight /BMI 01/01/2019 12/25/2018 11/27/2018  WEIGHT 213 lb 214 lb 212 lb  HEIGHT 5\' 9"  5\' 9"  5\' 9"   BMI 31.45 kg/m2 31.6 kg/m2 31.31 kg/m2      Hyperlipidemia LDL goal <70 Hyperlipidemia:Low fat diet discussed and encouraged.   Lipid Panel  Lab Results  Component Value Date   CHOL 117 11/04/2018   HDL 31 (L) 11/04/2018   LDLCALC 61 11/04/2018   TRIG 178 (H) 11/04/2018   CHOLHDL 3.8 11/04/2018    Needs to reduce fat in diet

## 2019-01-01 NOTE — Assessment & Plan Note (Signed)
Markedly improved, needs to reduce insulin dose , and toitrate down as needed. Change to lantus due to formulary Robert [name] is reminded of the importance of commitment to daily physical activity for 30 minutes or more, as able and the need to limit carbohydrate intake to 30 to 60 grams per meal to help with blood sugar control.   The need to take medication as prescribed, test blood sugar as directed, and to call between visits if there is a concern that blood sugar is uncontrolled is also discussed.   Robert [name] is reminded of the importance of daily foot exam, annual eye examination, and good blood sugar, blood pressure and cholesterol control.  Diabetic Labs Latest Ref Rng & Units 11/04/2018 07/04/2018 04/02/2018 02/26/2018 12/26/2017  HbA1c <5.7 % of total Hgb 9.3(H) 7.5(H) 8.1(H) - 8.6(H)  Microalbumin mg/dL - - - 8.0 -  Micro/Creat Ratio <30 mcg/mg creat - - - 17 -  Chol <200 mg/dL 117 - 116 - -  HDL > OR = 40 mg/dL 31(L) - 39(L) - -  Calc LDL mg/dL (calc) 61 - 59 - -  Triglycerides <150 mg/dL 178(H) - 97 - -  Creatinine 0.70 - 1.25 mg/dL 1.09 1.47(H) 1.32(H) - -   BP/Weight 01/01/2019 12/25/2018 11/27/2018 11/20/2018 11/05/2018 [id number]  Systolic BP 99991111 - - 123456 123456 [id number]  Diastolic BP 72 - - 70 70 86 82  Wt. (Lbs) 213 214 212 222 222 225 224  BMI 31.45 31.6 31.31 32.78 32.78 33.23 33.08   Foot/eye exam completion dates Latest Ref Rng & Units 11/05/2018 05/30/2018  Eye Exam No Retinopathy - No Retinopathy  Foot exam Order - - -  Foot Form Completion - Done -

## 2019-01-01 NOTE — Assessment & Plan Note (Signed)
  Patient re-educated about  the importance of commitment to a  minimum of 150 minutes of exercise per week as able.  The importance of healthy food choices with portion control discussed, as well as eating regularly and within a 12 hour window most days. The need to choose "clean , green" food 50 to 75% of the time is discussed, as well as to make water the primary drink and set a goal of 64 ounces water daily.    Weight /BMI 01/01/2019 12/25/2018 11/27/2018  WEIGHT 213 lb 214 lb 212 lb  HEIGHT 5\' 9"  5\' 9"  5\' 9"   BMI 31.45 kg/m2 31.6 kg/m2 31.31 kg/m2

## 2019-01-01 NOTE — Patient Instructions (Signed)
F/U telephone with MD re evaluate uncontrolled diabetes 2nd week in November, call if you need me sooner  New is lantus to replace levemir when next due Flu vaccine today  CONGRATS , start 65 units daily and reduce to 6o units in 1 to 2 weeks if needed  Bedtime sugar goal 130 to 1 80  Fasting sugar goal 80 to 130  HBA1C, chem 7 and EGFr non fast ,  Nov 4 or after

## 2019-01-01 NOTE — Assessment & Plan Note (Signed)
Hyperlipidemia:Low fat diet discussed and encouraged.   Lipid Panel  Lab Results  Component Value Date   CHOL 117 11/04/2018   HDL 31 (L) 11/04/2018   LDLCALC 61 11/04/2018   TRIG 178 (H) 11/04/2018   CHOLHDL 3.8 11/04/2018    Needs to reduce fat in diet

## 2019-01-03 ENCOUNTER — Other Ambulatory Visit: Payer: Self-pay

## 2019-01-03 ENCOUNTER — Encounter: Payer: Self-pay | Admitting: Podiatry

## 2019-01-03 ENCOUNTER — Ambulatory Visit: Payer: Medicare Other | Admitting: Podiatry

## 2019-01-03 DIAGNOSIS — M79674 Pain in right toe(s): Secondary | ICD-10-CM

## 2019-01-03 DIAGNOSIS — M79675 Pain in left toe(s): Secondary | ICD-10-CM

## 2019-01-03 DIAGNOSIS — Q828 Other specified congenital malformations of skin: Secondary | ICD-10-CM

## 2019-01-03 DIAGNOSIS — E1151 Type 2 diabetes mellitus with diabetic peripheral angiopathy without gangrene: Secondary | ICD-10-CM

## 2019-01-03 DIAGNOSIS — B351 Tinea unguium: Secondary | ICD-10-CM

## 2019-01-03 DIAGNOSIS — B353 Tinea pedis: Secondary | ICD-10-CM

## 2019-01-03 NOTE — Progress Notes (Signed)
Subjective: Robert [name] is a 68 y.o. y.o. male who presents today with h/o diabetes, plantar calluses and cc of painful, discolored, thick toenails and painful callus/corn which interfere with daily activities. Pain is aggravated when wearing enclosed shoe gear and relieved with periodic professional debridement.  Fayrene Helper, MD is his PCP.   Current Outpatient Medications on File Prior to Visit  Medication Sig Dispense Refill  . ACCU-CHEK FASTCLIX LANCETS MISC Use up to three times daily Dx e11.22 300 each 1  . aspirin 81 MG tablet Take 81 mg by mouth daily.      Marland Kitchen atorvastatin (LIPITOR) 40 MG tablet Take 1 tablet (40 mg total) by mouth daily. 90 tablet 2  . Blood Glucose Monitoring Suppl (ACCU-CHEK NANO SMARTVIEW) w/Device KIT TEST 1 TO 2 TIMES DAILY 1 kit 0  . celecoxib (CELEBREX) 100 MG capsule Take 100 mg by mouth 2 (two) times daily.    Marland Kitchen glipiZIDE (GLUCOTROL) 10 MG tablet Take 1 tablet (10 mg total) by mouth daily before breakfast. 90 tablet 3  . glucose blood (ACCU-CHEK SMARTVIEW) test strip Use as instructed three times daily DX e11.9 300 each 1  . Insulin Detemir (LEVEMIR) 100 UNIT/ML Pen 85 units once daily 45 mL 5  . Insulin Glargine (LANTUS) 100 UNIT/ML Solostar Pen Inject 70 Units into the skin daily. 15 mL 1  . Insulin Pen Needle (B-D ULTRAFINE III SHORT PEN) 31G X 8 MM MISC USE AS DIRECTED WITH LEVEMIR FLEXPEN daily 100 each 5  . Insulin Syringe-Needle U-100 (INSULIN SYRINGE .5CC/30GX5/16") 30G X 5/16" 0.5 ML MISC Use as directed with novolog 70/30 100 each 3  . metoprolol succinate (TOPROL-XL) 50 MG 24 hr tablet TAKE 1 TABLET DAILY WITH OR IMMEDIATELY AFTER A MEAL 90 tablet 2  . olmesartan (BENICAR) 20 MG tablet TAKE 1 TABLET DAILY (DOSE REDUCTION) 90 tablet 2  . sildenafil (VIAGRA) 100 MG tablet Take 1 tablet (100 mg total) by mouth daily as needed for erectile dysfunction. 10 tablet 3   No current facility-administered medications on file prior to visit.      Allergies  Allergen Reactions  . Rosuvastatin Other (See Comments)    Adverse GI symptoms    Objective: 68 yo AAM, WD, WN in NAD. AAO x 3.   Vascular Examination: Capillary refill time immediate x 10 digits.  Dorsalis pedis pulses 1/4 b/l.  Posterior tibial pulses 0/4 b/l.  Digital hair absent x 10 digits.  Skin temperature gradient WNL b/l.  Dermatological Examination: Skin with normal turgor, texture and tone b/l.  Toenails 1-5 b/l discolored, thick, dystrophic with subungual debris and pain with palpation to nailbeds due to thickness of nails.  Porokeratotic lesion submet head 5 left foot with tenderness to palpation. No erythema, no edema, no drainage, no flocculence. Resolved submet head 5 right foot lesion.  Very mild maceration 4th webspace left foot. No erythema, no edema, no cracks/breaks in skin, no edema, no drainage, no flocculence, no evidence of deep space infection.  Musculoskeletal: Muscle strength 5/5 to all LE muscle groups b/l.  Neurological: Sensation intact 5/5 b/l with 10 gram monofilament.  Vibratory sensation intact b/l.  Assessment: 1. Painful onychomycosis toenails 1-5 b/l 2.  Porokeratotic lesion submet head 5 left foot 3.  NIDDM with PAD 4.  Interdigital maceration 4th webspace left foot managed with OTC medication  Plan: 1. Continue diabetic foot care principles. Literature dispensed on today. 2. Toenails 1-5 b/l were debrided in length and girth without iatrogenic bleeding. 3.  Porokeratosis submet head 5 left foot pared and enucleated with sterile scalpel blade without incident.  4. Continue OTC powder/medication for interdigital maceration left 4th webspace. Call if skin breaks or condition worsens. 5. Patient to continue soft, supportive shoe gear daily. 6. Patient to report any pedal injuries to medical professional immediately. 7. Follow up 10 weeks.  8. Patient/POA to call should there be a concern in the interim.

## 2019-01-03 NOTE — Patient Instructions (Signed)
Diabetes Mellitus and Foot Care Foot care is an important part of your health, especially when you have diabetes. Diabetes may cause you to have problems because of poor blood flow (circulation) to your feet and legs, which can cause your skin to:  Become thinner and drier.  Break more easily.  Heal more slowly.  Peel and crack. You may also have nerve damage (neuropathy) in your legs and feet, causing decreased feeling in them. This means that you may not notice minor injuries to your feet that could lead to more serious problems. Noticing and addressing any potential problems early is the best way to prevent future foot problems. How to care for your feet Foot hygiene  Wash your feet daily with warm water and mild soap. Do not use hot water. Then, pat your feet and the areas between your toes until they are completely dry. Do not soak your feet as this can dry your skin.  Trim your toenails straight across. Do not dig under them or around the cuticle. File the edges of your nails with an emery board or nail file.  Apply a moisturizing lotion or petroleum jelly to the skin on your feet and to dry, brittle toenails. Use lotion that does not contain alcohol and is unscented. Do not apply lotion between your toes. Shoes and socks  Wear clean socks or stockings every day. Make sure they are not too tight. Do not wear knee-high stockings since they may decrease blood flow to your legs.  Wear shoes that fit properly and have enough cushioning. Always look in your shoes before you put them on to be sure there are no objects inside.  To break in new shoes, wear them for just a few hours a day. This prevents injuries on your feet. Wounds, scrapes, corns, and calluses  Check your feet daily for blisters, cuts, bruises, sores, and redness. If you cannot see the bottom of your feet, use a mirror or ask someone for help.  Do not cut corns or calluses or try to remove them with medicine.  If you  find a minor scrape, cut, or break in the skin on your feet, keep it and the skin around it clean and dry. You may clean these areas with mild soap and water. Do not clean the area with peroxide, alcohol, or iodine.  If you have a wound, scrape, corn, or callus on your foot, look at it several times a day to make sure it is healing and not infected. Check for: ? Redness, swelling, or pain. ? Fluid or blood. ? Warmth. ? Pus or a bad smell. General instructions  Do not cross your legs. This may decrease blood flow to your feet.  Do not use heating pads or hot water bottles on your feet. They may burn your skin. If you have lost feeling in your feet or legs, you may not know this is happening until it is too late.  Protect your feet from hot and cold by wearing shoes, such as at the beach or on hot pavement.  Schedule a complete foot exam at least once a year (annually) or more often if you have foot problems. If you have foot problems, report any cuts, sores, or bruises to your health care provider immediately. Contact a health care provider if:  You have a medical condition that increases your risk of infection and you have any cuts, sores, or bruises on your feet.  You have an injury that is not   healing.  You have redness on your legs or feet.  You feel burning or tingling in your legs or feet.  You have pain or cramps in your legs and feet.  Your legs or feet are numb.  Your feet always feel cold.  You have pain around a toenail. Get help right away if:  You have a wound, scrape, corn, or callus on your foot and: ? You have pain, swelling, or redness that gets worse. ? You have fluid or blood coming from the wound, scrape, corn, or callus. ? Your wound, scrape, corn, or callus feels warm to the touch. ? You have pus or a bad smell coming from the wound, scrape, corn, or callus. ? You have a fever. ? You have a red line going up your leg. Summary  Check your feet every day  for cuts, sores, red spots, swelling, and blisters.  Moisturize feet and legs daily.  Wear shoes that fit properly and have enough cushioning.  If you have foot problems, report any cuts, sores, or bruises to your health care provider immediately.  Schedule a complete foot exam at least once a year (annually) or more often if you have foot problems. This information is not intended to replace advice given to you by your health care provider. Make sure you discuss any questions you have with your health care provider. Document Released: 03/17/2000 Document Revised: 05/02/2017 Document Reviewed: 04/21/2016 Elsevier Patient Education  2020 [employer]   Onychomycosis/Fungal Toenails  WHAT IS IT? An infection that lies within the keratin of your nail plate that is caused by a fungus.  WHY ME? Fungal infections affect all ages, sexes, races, and creeds.  There may be many factors that predispose you to a fungal infection such as age, coexisting medical conditions such as diabetes, or an autoimmune disease; stress, medications, fatigue, genetics, etc.  Bottom line: fungus thrives in a warm, moist environment and your shoes offer such a location.  IS IT CONTAGIOUS? Theoretically, yes.  You do not want to share shoes, nail clippers or files with someone who has fungal toenails.  Walking around barefoot in the same room or sleeping in the same bed is unlikely to transfer the organism.  It is important to realize, however, that fungus can spread easily from one nail to the next on the same foot.  HOW DO WE TREAT THIS?  There are several ways to treat this condition.  Treatment may depend on many factors such as age, medications, pregnancy, liver and kidney conditions, etc.  It is best to ask your doctor which options are available to you.  1. No treatment.   Unlike many other medical concerns, you can live with this condition.  However for many people this can be a painful condition and may lead to  ingrown toenails or a bacterial infection.  It is recommended that you keep the nails cut short to help reduce the amount of fungal nail. 2. Topical treatment.  These range from herbal remedies to prescription strength nail lacquers.  About 40-50% effective, topicals require twice daily application for approximately 9 to 12 months or until an entirely new nail has grown out.  The most effective topicals are medical grade medications available through physicians offices. 3. Oral antifungal medications.  With an 80-90% cure rate, the most common oral medication requires 3 to 4 months of therapy and stays in your system for a year as the new nail grows out.  Oral antifungal medications do require   blood work to make sure it is a safe drug for you.  A liver function panel will be performed prior to starting the medication and after the first month of treatment.  It is important to have the blood work performed to avoid any harmful side effects.  In general, this medication safe but blood work is required. 4. Laser Therapy.  This treatment is performed by applying a specialized laser to the affected nail plate.  This therapy is noninvasive, fast, and non-painful.  It is not covered by insurance and is therefore, out of pocket.  The results have been very good with a 80-95% cure rate.  [facility] is the only practice in the area to offer this therapy. 5. Permanent Nail Avulsion.  Removing the entire nail so that a new nail will not grow back. 

## 2019-01-16 LAB — HEMOGLOBIN A1C: Hemoglobin A1C: 6.7

## 2019-01-21 ENCOUNTER — Other Ambulatory Visit: Payer: Self-pay | Admitting: Family Medicine

## 2019-02-07 DIAGNOSIS — E1159 Type 2 diabetes mellitus with other circulatory complications: Secondary | ICD-10-CM | POA: Diagnosis not present

## 2019-02-07 LAB — BASIC METABOLIC PANEL WITH GFR
BUN: 15 mg/dL (ref 7–25)
CO2: 27 mmol/L (ref 20–32)
Calcium: 9.5 mg/dL (ref 8.6–10.3)
Chloride: 107 mmol/L (ref 98–110)
Creat: 1.25 mg/dL (ref 0.70–1.25)
GFR, Est African American: 69 mL/min/{1.73_m2} (ref >=60)
GFR, Est Non African American: 59 mL/min/{1.73_m2} — ABNORMAL LOW (ref >=60)
Glucose, Bld: 66 mg/dL (ref 65–99)
Potassium: 4.7 mmol/L (ref 3.5–5.3)
Sodium: 142 mmol/L (ref 135–146)

## 2019-02-12 ENCOUNTER — Other Ambulatory Visit: Payer: Self-pay

## 2019-02-12 ENCOUNTER — Telehealth (INDEPENDENT_AMBULATORY_CARE_PROVIDER_SITE_OTHER): Payer: Medicare Other | Admitting: Student

## 2019-02-12 ENCOUNTER — Encounter: Payer: Self-pay | Admitting: Family Medicine

## 2019-02-12 ENCOUNTER — Ambulatory Visit (INDEPENDENT_AMBULATORY_CARE_PROVIDER_SITE_OTHER): Payer: Medicare Other | Admitting: Family Medicine

## 2019-02-12 ENCOUNTER — Encounter: Payer: Self-pay | Admitting: Student

## 2019-02-12 ENCOUNTER — Telehealth: Payer: Self-pay | Admitting: Student

## 2019-02-12 VITALS — BP 120/70 | Ht 69.0 in | Wt 208.0 lb

## 2019-02-12 VITALS — BP 120/70 | HR 68 | Ht 69.0 in | Wt 208.0 lb

## 2019-02-12 DIAGNOSIS — E1159 Type 2 diabetes mellitus with other circulatory complications: Secondary | ICD-10-CM | POA: Diagnosis not present

## 2019-02-12 DIAGNOSIS — M25551 Pain in right hip: Secondary | ICD-10-CM | POA: Diagnosis not present

## 2019-02-12 DIAGNOSIS — E1122 Type 2 diabetes mellitus with diabetic chronic kidney disease: Secondary | ICD-10-CM | POA: Diagnosis not present

## 2019-02-12 DIAGNOSIS — Z794 Long term (current) use of insulin: Secondary | ICD-10-CM

## 2019-02-12 DIAGNOSIS — N182 Chronic kidney disease, stage 2 (mild): Secondary | ICD-10-CM

## 2019-02-12 DIAGNOSIS — I2510 Atherosclerotic heart disease of native coronary artery without angina pectoris: Secondary | ICD-10-CM | POA: Diagnosis not present

## 2019-02-12 DIAGNOSIS — E785 Hyperlipidemia, unspecified: Secondary | ICD-10-CM | POA: Diagnosis not present

## 2019-02-12 DIAGNOSIS — E119 Type 2 diabetes mellitus without complications: Secondary | ICD-10-CM

## 2019-02-12 DIAGNOSIS — I10 Essential (primary) hypertension: Secondary | ICD-10-CM

## 2019-02-12 DIAGNOSIS — I129 Hypertensive chronic kidney disease with stage 1 through stage 4 chronic kidney disease, or unspecified chronic kidney disease: Secondary | ICD-10-CM

## 2019-02-12 MED ORDER — OLMESARTAN MEDOXOMIL 20 MG PO TABS: ORAL_TABLET | ORAL | 3 refills | Status: DC

## 2019-02-12 MED ORDER — ATORVASTATIN CALCIUM 40 MG PO TABS: 40.0000 mg | ORAL_TABLET | Freq: Every day | ORAL | 3 refills | Status: DC

## 2019-02-12 MED ORDER — METOPROLOL SUCCINATE ER 50 MG PO TB24: ORAL_TABLET | ORAL | 3 refills | Status: DC

## 2019-02-12 MED ORDER — NITROGLYCERIN 0.4 MG SL SUBL: 0.4000 mg | SUBLINGUAL_TABLET | SUBLINGUAL | 3 refills | Status: AC | PRN

## 2019-02-12 NOTE — Progress Notes (Signed)
Virtual Visit via Telephone Note   This visit type was conducted due to national recommendations for restrictions regarding the COVID-19 Pandemic (e.g. social distancing) in an effort to limit this patient's exposure and mitigate transmission in our community.  Due to his co-morbid illnesses, this patient is at least at moderate risk for complications without adequate follow up.  This format is felt to be most appropriate for this patient at this time.  The patient did not have access to video technology/had technical difficulties with video requiring transitioning to audio format only (telephone).  All issues noted in this document were discussed and addressed.  No physical exam could be performed with this format.  Please refer to the patient's chart for his  consent to telehealth for [facility].   Date:  02/12/2019   ID:  Robert [name], DOB 1950/09/07, MRN [id number]  Patient Location: Home Provider Location: Office  PCP:  Fayrene Helper, MD  Cardiologist:  Carlyle Dolly, MD  Electrophysiologist:  None   Evaluation Performed:  Follow-Up Visit  Chief Complaint:  6 month visit  History of Present Illness:    Robert [name] is a 68 y.o. male with pats medical history of CAD (s/p prior DES to LAD, known CTO of RCA by cath in 2003 that was unable to be opened, low-risk NST in 2016), HTN, HLD, IDDM and OSA who presents for a 9-monthfollow-up telehealth visit.   He most recently had a telehealth visit with Dr. BHarl Bowiein 08/2018 and denied any recent chest pain or dyspnea on exertion at that time. He did report intermittent fatigue which was thought to be secondary to deconditioning. Was continued on his current medication regimen including ASA 81 mg daily, Atorvastatin 40 mg daily, Toprol-XL 558mdaily and Benicar 2068maily.   In talking with the patient today, he reports overall doing well since his last office visit. He stays active at baseline and reports golfing several  times per week along with performing routine yard work. He denies any recent chest pain or dyspnea on exertion with these activities. He does report an occasional "gas-like" sensation along his chest which can last for several minutes and spontaneously resolves. No recent symptoms over the past several months. No recent orthopnea, PND or lower extremity edema.  He does not check his blood pressure regularly but it was well controlled at 120/70 when checked earlier today. He has been closely monitoring his blood sugar and his Hgb A1c has improved from 8.3 in 11/2018 to 6.7 on most recent check.   The patient does not have symptoms concerning for COVID-19 infection (fever, chills, cough, or new shortness of breath).    Past Medical History:  Diagnosis Date  . Arteriosclerotic cardiovascular disease (ASCVD)    [place]; EF of 45%; 12/2009: Equivocal stress nuclear with good exercise tolerance, negative stress EKG, normal EF with mild mid and distal inferior ischemia  . Coronary artery disease   . Diabetes mellitus, insulin dependent (IDDM), controlled 10/25/2012   HBA1C is 6.9 on 10/22/2012   . [place] . Hyperlipidemia    Lipid profile in 09/2011:128, 130, 33, 69  . Hypertension    Lab  09/2011: Normal CMet ex G-133  . Tobacco abuse, in remission    30 pack years; quit in 1985   Past Surgical History:  Procedure Laterality Date  . CARDIAC  CATHETERIZATION    . COLONOSCOPY  2010   Negative screening study  . COLONOSCOPY N/A 11/11/2014   Procedure: COLONOSCOPY;  Surgeon: Rogene Houston, MD;  Location: [facility];  Service: Endoscopy;  Laterality: N/A;  1030  . EPIDIDYMIS SURGERY  1996  . KNEE ARTHROSCOPY W/ MENISCECTOMY  11/2008   Right  . PRESSURE ULCER DEBRIDEMENT  2006   Right lower extremity  . ROTATOR CUFF REPAIR  07/2009   Right     Current Meds  Medication Sig  .  ACCU-CHEK FASTCLIX LANCETS MISC Use up to three times daily Dx e11.22  . acetaminophen (TYLENOL) 500 MG tablet Take 500 mg by mouth as needed.  Marland Kitchen aspirin 81 MG tablet Take 81 mg by mouth daily.    Marland Kitchen atorvastatin (LIPITOR) 40 MG tablet Take 1 tablet (40 mg total) by mouth daily.  . Blood Glucose Monitoring Suppl (ACCU-CHEK NANO SMARTVIEW) w/Device KIT TEST 1 TO 2 TIMES DAILY  . glipiZIDE (GLUCOTROL) 10 MG tablet Take 1 tablet (10 mg total) by mouth daily before breakfast.  . glucose blood (ACCU-CHEK SMARTVIEW) test strip Use as instructed three times daily DX e11.9  . Insulin Glargine (LANTUS) 100 UNIT/ML Solostar Pen Inject 70 Units into the skin daily.  . Insulin Pen Needle (B-D ULTRAFINE III SHORT PEN) 31G X 8 MM MISC USE AS DIRECTED WITH LEVEMIR FLEXPEN daily  . Insulin Syringe-Needle U-100 (INSULIN SYRINGE .5CC/30GX5/16") 30G X 5/16" 0.5 ML MISC Use as directed with novolog 70/30  . metoprolol succinate (TOPROL-XL) 50 MG 24 hr tablet TAKE 1 TABLET BY MOUTH  DAILY WITH OR IMMEDIATELY  AFTER A MEAL  . olmesartan (BENICAR) 20 MG tablet TAKE 1 TABLET BY MOUTH  DAILY (DOSE REDUCTION)  . sildenafil (VIAGRA) 100 MG tablet Take 1 tablet (100 mg total) by mouth daily as needed for erectile dysfunction.  . [DISCONTINUED] atorvastatin (LIPITOR) 40 MG tablet TAKE 1 TABLET BY MOUTH ONCE DAILY  . [DISCONTINUED] metoprolol succinate (TOPROL-XL) 50 MG 24 hr tablet TAKE 1 TABLET BY MOUTH  DAILY WITH OR IMMEDIATELY  AFTER A MEAL  . [DISCONTINUED] olmesartan (BENICAR) 20 MG tablet TAKE 1 TABLET BY MOUTH  DAILY (DOSE REDUCTION)     Allergies:   Rosuvastatin   Social History   Tobacco Use  . Smoking status: Former Smoker    Packs/day: 1.00    Years: 30.00    Pack years: 30.00    Types: Cigarettes    Start date: 04/03/1966    Quit date: 09/02/1983    Years since quitting: 35.4  . Smokeless tobacco: Never Used  Substance Use Topics  . Alcohol use: No    Alcohol/week: 0.0 standard drinks    Comment:  Former Tax inspector -discontinued use in 2005  . Drug use: No     Family Hx: The patient's family history includes Arthritis in his brother and mother; Cancer in his father; Diabetes in his brother and sister.  ROS:   Please see the history of present illness.     All other systems reviewed and are negative.   Prior CV studies:   The following studies were reviewed today:  NST: 08/2014  Low risk Duke treadmill score of 7.  Blood pressure demonstrated a hypertensive response to exercise.  There was no ST segment deviation noted during stress.  There is a small defect of mild severity present in the basal inferolateral, mid inferolateral and apical inferior location. The defect is reversible. Consistent with mild, predominantly mid to basal inferolateral ischemia.  Myocardial perfusion is abnormal. This is a low risk study.  Labs/Other Tests and Data Reviewed:    EKG:  No ECG reviewed.  Recent Labs: 04/02/2018: Hemoglobin 13.2; Platelets 268 11/04/2018: ALT 19; TSH 1.76 02/07/2019: BUN 15; Creat 1.25; Potassium 4.7; Sodium 142   Recent Lipid Panel Lab Results  Component Value Date/Time   CHOL 117 11/04/2018 07:51 AM   TRIG 178 (H) 11/04/2018 07:51 AM   HDL 31 (L) 11/04/2018 07:51 AM   CHOLHDL 3.8 11/04/2018 07:51 AM   LDLCALC 61 11/04/2018 07:51 AM    Wt Readings from Last 3 Encounters:  02/12/19 208 lb (94.3 kg)  02/12/19 208 lb (94.3 kg)  01/01/19 213 lb (96.6 kg)     Objective:    Vital Signs:  BP 120/70   Pulse 68   Ht 5' 9" (1.753 m)   Wt 208 lb (94.3 kg)   BMI 30.72 kg/m    General: Pleasant male sounding in NAD Psych: Normal affect. Neuro: Alert and oriented X 3.  Lungs:  Resp regular and unlabored while talking on the phone.    ASSESSMENT & PLAN:    1. CAD - he is s/p prior DES to LAD and has a known CTO of RCA by cath in 2003 that was unable to be opened. Most recent ischemic evaluation was a low-risk NST in 2016 as outlined above. - He is  active at baseline and denies any recent chest pain or dyspnea on exertion with these activities. He does report an occasional "gas pain" which occurs at rest and spontaneously resolves but denies any episodes of this over the past several months. - Will plan to continue with risk factor modification at this time. Reviewed warning signs of ACS with the patient. Continue current regimen with ASA, Atorvastatin, and Toprol-XL. Will update Rx for SL NTG.   2. HTN - BP was well controlled at 120/70 when checked earlier today. Continue current regimen with Toprol-XL 50 mg daily and Benicar 20 mg daily.  3. HLD - Followed by PCP. Recent FLP in 11/2018 showed total cholesterol 117, HDL 31, triglycerides 178 and LDL 61.  At goal LDL less than 70. Continue Atorvastatin 40mg daily.   4. IDDM - followed by PCP. He has made significant dietary adjustments recently with Hgb A1c improving from 8.3 in 11/2018 to 6.7 on most recent check. Congratulated on this!   COVID-19 Education: The signs and symptoms of COVID-19 were discussed with the patient and how to seek care for testing (follow up with PCP or arrange E-visit).  The importance of social distancing was discussed today.  Time:   Today, I have spent 18 minutes with the patient with telehealth technology discussing the above problems.     Medication Adjustments/Labs and Tests Ordered: Current medicines are reviewed at length with the patient today.  Concerns regarding medicines are outlined above.   Tests Ordered: No orders of the defined types were placed in this encounter.   Medication Changes: Meds ordered this encounter  Medications  . atorvastatin (LIPITOR) 40 MG tablet    Sig: Take 1 tablet (40 mg total) by mouth daily.    Dispense:  90 tablet    Refill:  3    Order Specific Question:   Supervising Provider    Answer:   NELSON, KATARINA H [[id number]]  . metoprolol succinate (TOPROL-XL) 50 MG 24 hr tablet    Sig: TAKE 1 TABLET BY MOUTH   DAILY WITH OR IMMEDIATELY  AFTER A MEAL      Dispense:  90 tablet    Refill:  3    Order Specific Question:   Supervising Provider    Answer:   NELSON, KATARINA H [[id number]]  . olmesartan (BENICAR) 20 MG tablet    Sig: TAKE 1 TABLET BY MOUTH  DAILY (DOSE REDUCTION)    Dispense:  90 tablet    Refill:  3    Order Specific Question:   Supervising Provider    Answer:   NELSON, KATARINA H [[id number]]  . nitroGLYCERIN (NITROSTAT) 0.4 MG SL tablet    Sig: Place 1 tablet (0.4 mg total) under the tongue every 5 (five) minutes as needed for chest pain.    Dispense:  25 tablet    Refill:  3    Order Specific Question:   Supervising Provider    Answer:   NELSON, KATARINA H [[id number]]    Follow Up:  In Person in 1 year(s)  Signed, Brittany M Strader, PA-C  02/12/2019 4:50 PM    [facility]  

## 2019-02-12 NOTE — Patient Instructions (Signed)
Medication Instructions:  Your physician recommends that you continue on your current medications as directed. Please refer to the Current Medication list given to you today.  *If you need a refill on your cardiac medications before your next appointment, please call your pharmacy*  Lab Work: NONE  If you have labs (blood work) drawn today and your tests are completely normal, you will receive your results only by: Marland Kitchen MyChart Message (if you have MyChart) OR . A paper copy in the mail If you have any lab test that is abnormal or we need to change your treatment, we will call you to review the results.  Testing/Procedures: NONE   Follow-Up: At [facility], you and your health needs are our priority.  As part of our continuing mission to provide you with exceptional heart care, we have created designated Provider Care Teams.  These Care Teams include your primary Cardiologist (physician) and Advanced Practice Providers (APPs -  Physician Assistants and Nurse Practitioners) who all work together to provide you with the care you need, when you need it.  Your next appointment:   12 months  The format for your next appointment:   In Person  Provider:   Carlyle Dolly, MD  Other Instructions Thank you for choosing [facility]!

## 2019-02-12 NOTE — Progress Notes (Signed)
Virtual Visit via Telephone Note  I connected with Robert [name] on 02/12/19 at  8:40 AM EST by telephone and verified that I am speaking with the correct person using two identifiers.  Location: Patient: home Provider: office   I discussed the limitations, risks, security and privacy concerns of performing an evaluation and management service by telephone and the availability of in person appointments. I also discussed with the patient that there may be a patient responsible charge related to this service. The patient expressed understanding and agreed to proceed.   History of Present Illness: Right hip discomfort with pressure when lying down x 3 months, intermittent, wakes up patient, at its worse a 3 Denies polyuria, polydipsia, blurred vision , , lowest blood sugar was in the low 70's, felt a bit shaky.Has commited to lifestyle change with improved blood sugars  Denies recent fever or chills. Denies sinus pressure, nasal congestion, ear pain or sore throat. Denies chest congestion, productive cough or wheezing. Denies chest pains, palpitations and leg swelling Denies abdominal pain, nausea, vomiting,diarrhea or constipation.   Denies dysuria, frequency, hesitancy or incontinence. C/o left hip pain with direct pressure , eg when sleeping, chronic knee pain Denies headaches, seizures, numbness, or tingling. Denies depression, anxiety or insomnia. Denies skin break down or rash.        Observations/Objective: BP 120/70   Ht 5\' 9"  (1.753 m)   Wt 208 lb (94.3 kg)   BMI 30.72 kg/m  Good communication with no confusion and intact memory. Alert and oriented x 3 No signs of respiratory distress during speech    Assessment and Plan: Hypertension associated with stage 2 chronic kidney disease due to type 2 diabetes mellitus Controlled, no change in medication DASH diet and commitment to daily physical activity for a minimum of 30 minutes discussed and encouraged, as a part of  hypertension management. The importance of attaining a healthy weight is also discussed.  BP/Weight 02/12/2019 02/12/2019 01/01/2019 12/25/2018 11/27/2018 [id number]  Systolic BP 123456 123456 [id number] - - 123456 123456  Diastolic BP 70 70 72 - - 70 70  Wt. (Lbs) 208 208 213 214 212 222 222  BMI 30.72 30.72 31.45 31.6 31.31 32.78 32.78       Type 2 diabetes mellitus with vascular disease (HCC) Marked improvement , reduce lantus dose Mr. Banner is reminded of the importance of commitment to daily physical activity for 30 minutes or more, as able and the need to limit carbohydrate intake to 30 to 60 grams per meal to help with blood sugar control.   The need to take medication as prescribed, test blood sugar as directed, and to call between visits if there is a concern that blood sugar is uncontrolled is also discussed.   Mr. Degruy is reminded of the importance of daily foot exam, annual eye examination, and good blood sugar, blood pressure and cholesterol control.  Diabetic Labs Latest Ref Rng & Units 02/07/2019 01/16/2019 11/04/2018 07/04/2018 04/02/2018  HbA1c - - 6.7 9.3(H) 7.5(H) 8.1(H)  Microalbumin mg/dL - - - - -  Micro/Creat Ratio <30 mcg/mg creat - - - - -  Chol <200 mg/dL - - 117 - 116  HDL > OR = 40 mg/dL - - 31(L) - 39(L)  Calc LDL mg/dL (calc) - - 61 - 59  Triglycerides <150 mg/dL - - 178(H) - 97  Creatinine 0.70 - 1.25 mg/dL 1.25 - 1.09 1.47(H) 1.32(H)   BP/Weight 02/12/2019 02/12/2019 01/01/2019 12/25/2018 11/27/2018 [id number]  Systolic BP  120 120 114 - - 123456 123456  Diastolic BP 70 70 72 - - 70 70  Wt. (Lbs) 208 208 213 214 212 222 222  BMI 30.72 30.72 31.45 31.6 31.31 32.78 32.78   Foot/eye exam completion dates Latest Ref Rng & Units 11/05/2018 05/30/2018  Eye Exam No Retinopathy - No Retinopathy  Foot exam Order - - -  Foot Form Completion - Done -        Hyperlipidemia LDL goal <70 Hyperlipidemia:Low fat diet discussed and encouraged.   Lipid Panel  Lab Results   Component Value Date   CHOL 117 11/04/2018   HDL 31 (L) 11/04/2018   LDLCALC 61 11/04/2018   TRIG 178 (H) 11/04/2018   CHOLHDL 3.8 11/04/2018   Needs to lower fat and increase exercise     Right hip pain Pain with direct pressure, no instability, will monitor    Follow Up Instructions:    I discussed the assessment and treatment plan with the patient. The patient was provided an opportunity to ask questions and all were answered. The patient agreed with the plan and demonstrated an understanding of the instructions.   The patient was advised to call back or seek an in-person evaluation if the symptoms worsen or if the condition fails to improve as anticipated.  I provided 21 minutes of non-face-to-face time during this encounter.   Tula Nakayama, MD

## 2019-02-12 NOTE — Patient Instructions (Addendum)
F/u mid February in office with MD, call if you need me before  Congrats on marked improvement in blood sugar, reduce lantus to 60 units daily  It is important that you exercise regularly at least 30 minutes 5 times a week. If you develop chest pain, have severe difficulty breathing, or feel very tired, stop exercising immediately and seek medical attention   Please get fasting lipid, cmp and eGFR, hBA1C 5  Days before next visit   Microalb from office on December 2 ( nurse please note pt plans to come in on that day)  Think about what you will eat, plan ahead. Choose " clean, green, fresh or frozen" over canned, processed or packaged foods which are more sugary, salty and fatty. 70 to 75% of food eaten should be vegetables and fruit. Three meals at set times with snacks allowed between meals, but they must be fruit or vegetables. Aim to eat over a 12 hour period , example 7 am to 7 pm, and STOP after  your last meal of the day. Drink water,generally about 64 ounces per day, no other drink is as healthy. Fruit juice is best enjoyed in a healthy way, by EATING the fruit.

## 2019-02-12 NOTE — Telephone Encounter (Signed)
Virtual Visit Pre-Appointment Phone Call  "(Name), I am calling you today to discuss your upcoming appointment. We are currently trying to limit exposure to the virus that causes COVID-19 by seeing patients at home rather than in the office."  1. "What is the BEST phone number to call the day of the visit?" - include this in appointment notes  2. Do you have or have access to (through a family member/friend) a smartphone with video capability that we can use for your visit?" a. If yes - list this number in appt notes as cell (if different from BEST phone #) and list the appointment type as a VIDEO visit in appointment notes b. If no - list the appointment type as a PHONE visit in appointment notes  3. Confirm consent - "In the setting of the current Covid19 crisis, you are scheduled for a (phone or video) visit with your provider on (date) at (time).  Just as we do with many in-office visits, in order for you to participate in this visit, we must obtain consent.  If you'd like, I can send this to your mychart (if signed up) or email for you to review.  Otherwise, I can obtain your verbal consent now.  All virtual visits are billed to your insurance company just like a normal visit would be.  By agreeing to a virtual visit, we'd like you to understand that the technology does not allow for your provider to perform an examination, and thus may limit your provider's ability to fully assess your condition. If your provider identifies any concerns that need to be evaluated in person, we will make arrangements to do so.  Finally, though the technology is pretty good, we cannot assure that it will always work on either your or our end, and in the setting of a video visit, we may have to convert it to a phone-only visit.  In either situation, we cannot ensure that we have a secure connection.  Are you willing to proceed?" STAFF: Did the patient verbally acknowledge consent to telehealth visit? Document  YES/NO here: yes  4. Advise patient to be prepared - "Two hours prior to your appointment, go ahead and check your blood pressure, pulse, oxygen saturation, and your weight (if you have the equipment to check those) and write them all down. When your visit starts, your provider will ask you for this information. If you have an Apple Watch or Kardia device, please plan to have heart rate information ready on the day of your appointment. Please have a pen and paper handy nearby the day of the visit as well."  5. Give patient instructions for MyChart download to smartphone OR Doximity/Doxy.me as below if video visit (depending on what platform provider is using)  6. Inform patient they will receive a phone call 15 minutes prior to their appointment time (may be from unknown caller ID) so they should be prepared to answer    Robert [name] has been deemed a candidate for a follow-up tele-health visit to limit community exposure during the Covid-19 pandemic. I spoke with the patient via phone to ensure availability of phone/video source, confirm preferred email & phone number, and discuss instructions and expectations.  I reminded Robert [name] to be prepared with any vital sign and/or heart rhythm information that could potentially be obtained via home monitoring, at the time of his visit. I reminded Robert [name] to expect a phone call prior to  his visit.  Robert [name] 02/12/2019 9:21 AM   INSTRUCTIONS FOR DOWNLOADING THE MYCHART APP TO SMARTPHONE  - The patient must first make sure to have activated MyChart and know their login information - If Apple, go to [employer] and type in MyChart in the search bar and download the app. If Android, ask patient to go to [employer] and type in [place] in the search bar and download the app. The app is free but as with any other app downloads, their phone may require them to verify saved payment information or  Apple/Android password.  - The patient will need to then log into the app with their MyChart username and password, and select  as their healthcare provider to link the account. When it is time for your visit, go to the MyChart app, find appointments, and click Begin Video Visit. Be sure to Select Allow for your device to access the Microphone and Camera for your visit. You will then be connected, and your provider will be with you shortly.  **If they have any issues connecting, or need assistance please contact MyChart service desk [phone])**  **If using a computer, in order to ensure the best quality for their visit they will need to use either of the following Internet Browsers: [employer], or Google Chrome**  IF USING DOXIMITY or DOXY.ME - The patient will receive a link just prior to their visit by text.     FULL LENGTH CONSENT FOR TELE-HEALTH VISIT   I hereby voluntarily request, consent and authorize [facility] and its employed or contracted physicians, physician assistants, nurse practitioners or other licensed health care professionals (the Practitioner), to provide me with telemedicine health care services (the Services") as deemed necessary by the treating Practitioner. I acknowledge and consent to receive the Services by the Practitioner via telemedicine. I understand that the telemedicine visit will involve communicating with the Practitioner through live audiovisual communication technology and the disclosure of certain medical information by electronic transmission. I acknowledge that I have been given the opportunity to request an in-person assessment or other available alternative prior to the telemedicine visit and am voluntarily participating in the telemedicine visit.  I understand that I have the right to withhold or withdraw my consent to the use of telemedicine in the course of my care at any time, without affecting my right to future care  or treatment, and that the Practitioner or I may terminate the telemedicine visit at any time. I understand that I have the right to inspect all information obtained and/or recorded in the course of the telemedicine visit and may receive copies of available information for a reasonable fee.  I understand that some of the potential risks of receiving the Services via telemedicine include:   Delay or interruption in medical evaluation due to technological equipment failure or disruption;  Information transmitted may not be sufficient (e.g. poor resolution of images) to allow for appropriate medical decision making by the Practitioner; and/or   In rare instances, security protocols could fail, causing a breach of personal health information.  Furthermore, I acknowledge that it is my responsibility to provide information about my medical history, conditions and care that is complete and accurate to the best of my ability. I acknowledge that Practitioner's advice, recommendations, and/or decision may be based on factors not within their control, such as incomplete or inaccurate data provided by me or distortions of diagnostic images or specimens that may result from electronic transmissions. I  understand that the practice of medicine is not an exact science and that Practitioner makes no warranties or guarantees regarding treatment outcomes. I acknowledge that I will receive a copy of this consent concurrently upon execution via email to the email address I last provided but may also request a printed copy by calling the office of [facility].    I understand that my insurance will be billed for this visit.   I have read or had this consent read to me.  I understand the contents of this consent, which adequately explains the benefits and risks of the Services being provided via telemedicine.   I have been provided ample opportunity to ask questions regarding this consent and the Services and have had  my questions answered to my satisfaction.  I give my informed consent for the services to be provided through the use of telemedicine in my medical care  By participating in this telemedicine visit I agree to the above.

## 2019-02-16 ENCOUNTER — Encounter: Payer: Self-pay | Admitting: Family Medicine

## 2019-02-16 DIAGNOSIS — M25551 Pain in right hip: Secondary | ICD-10-CM | POA: Insufficient documentation

## 2019-02-16 NOTE — Assessment & Plan Note (Signed)
Controlled, no change in medication DASH diet and commitment to daily physical activity for a minimum of 30 minutes discussed and encouraged, as a part of hypertension management. The importance of attaining a healthy weight is also discussed.  BP/Weight 02/12/2019 02/12/2019 01/01/2019 12/25/2018 11/27/2018 [id number]  Systolic BP 123456 123456 [id number] - - 123456 123456  Diastolic BP 70 70 72 - - 70 70  Wt. (Lbs) 208 208 213 214 212 222 222  BMI 30.72 30.72 31.45 31.6 31.31 32.78 32.78

## 2019-02-16 NOTE — Assessment & Plan Note (Signed)
Marked improvement , reduce lantus dose [name] is reminded of the importance of commitment to daily physical activity for 30 minutes or more, as able and the need to limit carbohydrate intake to 30 to 60 grams per meal to help with blood sugar control.   The need to take medication as prescribed, test blood sugar as directed, and to call between visits if there is a concern that blood sugar is uncontrolled is also discussed.   [name] is reminded of the importance of daily foot exam, annual eye examination, and good blood sugar, blood pressure and cholesterol control.  Diabetic Labs Latest Ref Rng & Units 02/07/2019 01/16/2019 11/04/2018 07/04/2018 04/02/2018  HbA1c - - 6.7 9.3(H) 7.5(H) 8.1(H)  Microalbumin mg/dL - - - - -  Micro/Creat Ratio <30 mcg/mg creat - - - - -  Chol <200 mg/dL - - 117 - 116  HDL > OR = 40 mg/dL - - 31(L) - 39(L)  Calc LDL mg/dL (calc) - - 61 - 59  Triglycerides <150 mg/dL - - 178(H) - 97  Creatinine 0.70 - 1.25 mg/dL 1.25 - 1.09 1.47(H) 1.32(H)   BP/Weight 02/12/2019 02/12/2019 01/01/2019 12/25/2018 11/27/2018 [id number]  Systolic BP 123456 123456 [id number] - - 123456 123456  Diastolic BP 70 70 72 - - 70 70  Wt. (Lbs) 208 208 213 214 212 222 222  BMI 30.72 30.72 31.45 31.6 31.31 32.78 32.78   Foot/eye exam completion dates Latest Ref Rng & Units 11/05/2018 05/30/2018  Eye Exam No Retinopathy - No Retinopathy  Foot exam Order - - -  Foot Form Completion - Done -

## 2019-02-16 NOTE — Assessment & Plan Note (Signed)
Pain with direct pressure, no instability, will monitor

## 2019-02-16 NOTE — Assessment & Plan Note (Signed)
Hyperlipidemia:Low fat diet discussed and encouraged.   Lipid Panel  Lab Results  Component Value Date   CHOL 117 11/04/2018   HDL 31 (L) 11/04/2018   LDLCALC 61 11/04/2018   TRIG 178 (H) 11/04/2018   CHOLHDL 3.8 11/04/2018   Needs to lower fat and increase exercise

## 2019-02-19 ENCOUNTER — Other Ambulatory Visit: Payer: Self-pay | Admitting: Family Medicine

## 2019-03-04 ENCOUNTER — Ambulatory Visit: Payer: Medicare Other | Admitting: Family Medicine

## 2019-03-05 ENCOUNTER — Ambulatory Visit: Payer: Medicare Other

## 2019-03-05 ENCOUNTER — Other Ambulatory Visit (HOSPITAL_COMMUNITY)
Admission: RE | Admit: 2019-03-05 | Discharge: 2019-03-05 | Disposition: A | Discharge status: Home / Self Care | Payer: Medicare Other | Source: Ambulatory Visit | Attending: Family Medicine | Admitting: Family Medicine

## 2019-03-05 ENCOUNTER — Other Ambulatory Visit: Payer: Self-pay

## 2019-03-05 DIAGNOSIS — E1159 Type 2 diabetes mellitus with other circulatory complications: Secondary | ICD-10-CM | POA: Insufficient documentation

## 2019-03-06 LAB — MICROALBUMIN / CREATININE URINE RATIO
Creatinine, Urine: 185.9 mg/dL
Microalb Creat Ratio: 22 mg/g creat (ref 0–29)
Microalb, Ur: 40.7 ug/mL — ABNORMAL HIGH

## 2019-03-13 ENCOUNTER — Other Ambulatory Visit: Payer: Self-pay | Admitting: Family Medicine

## 2019-03-21 ENCOUNTER — Ambulatory Visit: Payer: Medicare Other | Admitting: Podiatry

## 2019-03-21 ENCOUNTER — Encounter: Payer: Self-pay | Admitting: Podiatry

## 2019-03-21 ENCOUNTER — Other Ambulatory Visit: Payer: Self-pay

## 2019-03-21 DIAGNOSIS — B351 Tinea unguium: Secondary | ICD-10-CM

## 2019-03-21 DIAGNOSIS — B353 Tinea pedis: Secondary | ICD-10-CM

## 2019-03-21 DIAGNOSIS — M79675 Pain in left toe(s): Secondary | ICD-10-CM

## 2019-03-21 DIAGNOSIS — E1151 Type 2 diabetes mellitus with diabetic peripheral angiopathy without gangrene: Secondary | ICD-10-CM | POA: Diagnosis not present

## 2019-03-21 DIAGNOSIS — M79674 Pain in right toe(s): Secondary | ICD-10-CM

## 2019-03-21 DIAGNOSIS — L84 Corns and callosities: Secondary | ICD-10-CM | POA: Diagnosis not present

## 2019-03-21 MED ORDER — NYSTATIN 100000 UNIT/GM EX POWD: 1.0000 "application " | Freq: Three times a day (TID) | CUTANEOUS | 1 refills | Status: DC

## 2019-03-21 MED ORDER — NYSTATIN 100000 UNIT/GM EX POWD: CUTANEOUS | 2 refills | Status: DC

## 2019-03-21 NOTE — Progress Notes (Signed)
Subjective: Robert [name] is a 68 y.o. y.o. male who presents today for preventative diabetic foot care with cc of painful, discolored, thick toenails and calluses which interfere with daily activities. Pain is aggravated when wearing enclosed shoe gear and relieved with periodic professional debridement.  Fayrene Helper, MD is his PCP.   He voices no new pedal concerns on today's visit.  Medications reviewed in chart.  Allergies  Allergen Reactions  . Rosuvastatin Other (See Comments)    Adverse GI symptoms    Objective: There were no vitals filed for this visit.  Vascular Examination: Capillary refill time to digits immediate b/l.  Dorsalis pedis pulses faintly palpable b/l.  Posterior tibial pulses nonpalpable b/l.  Digital hair sparse b/l.  Skin temperature gradient WNL b/l.  Dermatological Examination: Skin with normal turgor, texture and tone b/l.  Toenails 1-5 b/l discolored, thick, dystrophic with subungual debris and pain with palpation to nailbeds due to thickness of nails.  Hyperkeratotic lesions submet head 5 b/l. No erythema, no edema, no drainage, no flocculence noted.   Mild interdigital maceration left 4th webspace.   Musculoskeletal: Muscle strength 5/5 to all LE muscle groups b/l.  Neurological: Sensation intact 5/5 b/l with 10 gram monofilament.  Vibratory sensation remains intact b/l.  Assessment: 1. Painful onychomycosis toenails 1-5 b/l 2. Tinea pedis left 4th webspace 3.  Calluses submet head 5 b/l 4.  NIDDM with PAD  Plan: 1. Continue diabetic foot care principles. Literature dispensed on today. 2. Toenails 1-5 b/l were debrided in length and girth without iatrogenic bleeding. Rx sent to pharmacy for Nystatin Powder to be applied between toes and under toes once daily. 3. Hyperkeratotic lesions submet head 5 b/l pared with sterile scalpel blade without incident. 4. Patient to continue soft, supportive shoe gear daily. 5. Patient  to report any pedal injuries to medical professional immediately. 6. Follow up 10 weeks.  7. Patient/POA to call should there be a concern in the interim.

## 2019-03-21 NOTE — Patient Instructions (Signed)
Diabetes Mellitus and Foot Care Foot care is an important part of your health, especially when you have diabetes. Diabetes may cause you to have problems because of poor blood flow (circulation) to your feet and legs, which can cause your skin to:  Become thinner and drier.  Break more easily.  Heal more slowly.  Peel and crack. You may also have nerve damage (neuropathy) in your legs and feet, causing decreased feeling in them. This means that you may not notice minor injuries to your feet that could lead to more serious problems. Noticing and addressing any potential problems early is the best way to prevent future foot problems. How to care for your feet Foot hygiene  Wash your feet daily with warm water and mild soap. Do not use hot water. Then, pat your feet and the areas between your toes until they are completely dry. Do not soak your feet as this can dry your skin.  Trim your toenails straight across. Do not dig under them or around the cuticle. File the edges of your nails with an emery board or nail file.  Apply a moisturizing lotion or petroleum jelly to the skin on your feet and to dry, brittle toenails. Use lotion that does not contain alcohol and is unscented. Do not apply lotion between your toes. Shoes and socks  Wear clean socks or stockings every day. Make sure they are not too tight. Do not wear knee-high stockings since they may decrease blood flow to your legs.  Wear shoes that fit properly and have enough cushioning. Always look in your shoes before you put them on to be sure there are no objects inside.  To break in new shoes, wear them for just a few hours a day. This prevents injuries on your feet. Wounds, scrapes, corns, and calluses  Check your feet daily for blisters, cuts, bruises, sores, and redness. If you cannot see the bottom of your feet, use a mirror or ask someone for help.  Do not cut corns or calluses or try to remove them with medicine.  If you  find a minor scrape, cut, or break in the skin on your feet, keep it and the skin around it clean and dry. You may clean these areas with mild soap and water. Do not clean the area with peroxide, alcohol, or iodine.  If you have a wound, scrape, corn, or callus on your foot, look at it several times a day to make sure it is healing and not infected. Check for: ? Redness, swelling, or pain. ? Fluid or blood. ? Warmth. ? Pus or a bad smell. General instructions  Do not cross your legs. This may decrease blood flow to your feet.  Do not use heating pads or hot water bottles on your feet. They may burn your skin. If you have lost feeling in your feet or legs, you may not know this is happening until it is too late.  Protect your feet from hot and cold by wearing shoes, such as at the beach or on hot pavement.  Schedule a complete foot exam at least once a year (annually) or more often if you have foot problems. If you have foot problems, report any cuts, sores, or bruises to your health care provider immediately. Contact a health care provider if:  You have a medical condition that increases your risk of infection and you have any cuts, sores, or bruises on your feet.  You have an injury that is not   healing.  You have redness on your legs or feet.  You feel burning or tingling in your legs or feet.  You have pain or cramps in your legs and feet.  Your legs or feet are numb.  Your feet always feel cold.  You have pain around a toenail. Get help right away if:  You have a wound, scrape, corn, or callus on your foot and: ? You have pain, swelling, or redness that gets worse. ? You have fluid or blood coming from the wound, scrape, corn, or callus. ? Your wound, scrape, corn, or callus feels warm to the touch. ? You have pus or a bad smell coming from the wound, scrape, corn, or callus. ? You have a fever. ? You have a red line going up your leg. Summary  Check your feet every day  for cuts, sores, red spots, swelling, and blisters.  Moisturize feet and legs daily.  Wear shoes that fit properly and have enough cushioning.  If you have foot problems, report any cuts, sores, or bruises to your health care provider immediately.  Schedule a complete foot exam at least once a year (annually) or more often if you have foot problems. This information is not intended to replace advice given to you by your health care provider. Make sure you discuss any questions you have with your health care provider. Document Released: 03/17/2000 Document Revised: 05/02/2017 Document Reviewed: 04/21/2016 Elsevier Patient Education  2020 [employer]  

## 2019-04-09 ENCOUNTER — Encounter: Payer: Self-pay | Admitting: Nutrition

## 2019-04-09 ENCOUNTER — Other Ambulatory Visit: Payer: Self-pay

## 2019-04-09 ENCOUNTER — Encounter: Payer: Medicare Other | Attending: Family Medicine | Admitting: Nutrition

## 2019-04-09 VITALS — Ht 69.0 in | Wt 218.0 lb

## 2019-04-09 DIAGNOSIS — E1122 Type 2 diabetes mellitus with diabetic chronic kidney disease: Secondary | ICD-10-CM | POA: Insufficient documentation

## 2019-04-09 DIAGNOSIS — N182 Chronic kidney disease, stage 2 (mild): Secondary | ICD-10-CM | POA: Insufficient documentation

## 2019-04-09 DIAGNOSIS — E669 Obesity, unspecified: Secondary | ICD-10-CM | POA: Diagnosis present

## 2019-04-09 DIAGNOSIS — I129 Hypertensive chronic kidney disease with stage 1 through stage 4 chronic kidney disease, or unspecified chronic kidney disease: Secondary | ICD-10-CM | POA: Insufficient documentation

## 2019-04-09 DIAGNOSIS — E1159 Type 2 diabetes mellitus with other circulatory complications: Secondary | ICD-10-CM | POA: Diagnosis not present

## 2019-04-09 DIAGNOSIS — E785 Hyperlipidemia, unspecified: Secondary | ICD-10-CM | POA: Diagnosis not present

## 2019-04-09 NOTE — Progress Notes (Signed)
  Medical Nutrition Therapy:  Appt start time: [id number] end time:  1600.  Assessment:  Primary concerns today: Diabetes Type 2,  Doing much better. Lives with his wife. Most meals are cooked at home. Eats out some. FBS 80-100's.. 7 day avg 111 mg/dl,  14 day 118 mg/dl avg. Lantus  Reduced to 50 units and Glipizide 10 mg once a day. Plays golf a few times per week weather permitting.  Lab Results  Component Value Date   HGBA1C 6.7 01/16/2019    Preferred Learning Style:   Auditory  Visual  Hands on  Learning Readiness:   Ready  Change in progress   MEDICATIONS:    DIETARY INTAKE:    24-hr recall:  B ( AM): 1-2 eggs, 2 waffles,  Snk ( AM): L ( PM): Baked chicken, corn and beans. Water Snk ( PM): D ( PM): Hamburger no bun, string beans, banana Snk ( PM):  Beverages: water  Usual physical activity: plays golf.   Estimated energy needs: 2200  calories 248  g carbohydrates 165 g protein 61 g fat  Progress Towards Goal(s):  In progress.   Nutritional Diagnosis:  NB-1.1 Food and nutrition-related knowledge deficit As related to Diabetes Type 2.  As evidenced by A1C 9.3%.    Intervention:  Nutrition and Diabetes education provided on My Plate, CHO counting, meal planning, portion sizes, timing of meals, avoiding snacks between meals unless having a low blood sugar, target ranges for A1C and blood sugars, signs/symptoms and treatment of hyper/hypoglycemia, monitoring blood sugars, taking medications as prescribed, benefits of exercising 30 minutes per day and prevention of complications of DM.   Keep up the good work! Try to avoid processed meats Prevent low blood sugars Get A1C to 6% or less. Stay active as much as possible.  Teaching Method Utilized: Visual Auditory Hands on  Handouts given during visit include:  Emailed The Plate Method, Meal Plan, Diabetes Instructions.   Barriers to learning/adherence to lifestyle change: none  Demonstrated degree of  understanding via:  Teach Back   Monitoring/Evaluation:  Dietary intake, exercise, and body weight in 1 month(s).

## 2019-04-10 ENCOUNTER — Encounter: Payer: Self-pay | Admitting: Nutrition

## 2019-04-10 NOTE — Patient Instructions (Signed)
Keep up the good work! Try to avoid processed meats Prevent low blood sugars Get A1C to 6% or less. Stay active as much as possible.

## 2019-05-15 DIAGNOSIS — E785 Hyperlipidemia, unspecified: Secondary | ICD-10-CM | POA: Diagnosis not present

## 2019-05-15 DIAGNOSIS — E1159 Type 2 diabetes mellitus with other circulatory complications: Secondary | ICD-10-CM | POA: Diagnosis not present

## 2019-05-16 LAB — COMPLETE METABOLIC PANEL WITH GFR
AG Ratio: 1.3 (calc) (ref 1.0–2.5)
ALT: 35 U/L (ref 9–46)
AST: 23 U/L (ref 10–35)
Albumin: 4 g/dL (ref 3.6–5.1)
Alkaline phosphatase (APISO): 117 U/L (ref 35–144)
BUN: 18 mg/dL (ref 7–25)
CO2: 26 mmol/L (ref 20–32)
Calcium: 9 mg/dL (ref 8.6–10.3)
Chloride: 108 mmol/L (ref 98–110)
Creat: 1.25 mg/dL (ref 0.70–1.25)
GFR, Est African American: 69 mL/min/{1.73_m2} (ref >=60)
GFR, Est Non African American: 59 mL/min/{1.73_m2} — ABNORMAL LOW (ref >=60)
Globulin: 3.1 g/dL (calc) (ref 1.9–3.7)
Glucose, Bld: 80 mg/dL (ref 65–99)
Potassium: 4.6 mmol/L (ref 3.5–5.3)
Sodium: 142 mmol/L (ref 135–146)
Total Bilirubin: 0.6 mg/dL (ref 0.2–1.2)
Total Protein: 7.1 g/dL (ref 6.1–8.1)

## 2019-05-16 LAB — HEMOGLOBIN A1C
Hgb A1c MFr Bld: 7.1 % of total Hgb — ABNORMAL HIGH (ref <5.7)
Mean Plasma Glucose: 157 (calc)
eAG (mmol/L): 8.7 (calc)

## 2019-05-16 LAB — LIPID PANEL
Cholesterol: 117 mg/dL (ref <200)
HDL: 38 mg/dL — ABNORMAL LOW (ref >=40)
LDL Cholesterol (Calc): 63 mg/dL (calc)
Non-HDL Cholesterol (Calc): 79 mg/dL (calc) (ref <130)
Total CHOL/HDL Ratio: 3.1 (calc) (ref <5.0)
Triglycerides: 84 mg/dL (ref <150)

## 2019-05-21 ENCOUNTER — Ambulatory Visit: Payer: Medicare Other | Admitting: Family Medicine

## 2019-05-26 ENCOUNTER — Other Ambulatory Visit: Payer: Self-pay

## 2019-05-26 ENCOUNTER — Ambulatory Visit: Payer: Medicare Other | Admitting: Podiatry

## 2019-05-26 ENCOUNTER — Encounter: Payer: Self-pay | Admitting: Podiatry

## 2019-05-26 VITALS — Temp 98.0°F

## 2019-05-26 DIAGNOSIS — E1151 Type 2 diabetes mellitus with diabetic peripheral angiopathy without gangrene: Secondary | ICD-10-CM

## 2019-05-26 DIAGNOSIS — R21 Rash and other nonspecific skin eruption: Secondary | ICD-10-CM | POA: Diagnosis not present

## 2019-05-26 DIAGNOSIS — M79675 Pain in left toe(s): Secondary | ICD-10-CM

## 2019-05-26 DIAGNOSIS — L84 Corns and callosities: Secondary | ICD-10-CM | POA: Diagnosis not present

## 2019-05-26 DIAGNOSIS — M79674 Pain in right toe(s): Secondary | ICD-10-CM | POA: Diagnosis not present

## 2019-05-26 DIAGNOSIS — B351 Tinea unguium: Secondary | ICD-10-CM | POA: Diagnosis not present

## 2019-05-26 MED ORDER — TRIAMCINOLONE ACETONIDE 0.1 % EX OINT: 1.0000 "application " | TOPICAL_OINTMENT | Freq: Two times a day (BID) | CUTANEOUS | 0 refills | Status: DC

## 2019-05-26 NOTE — Patient Instructions (Addendum)
Diabetes Mellitus and Foot Care Foot care is an important part of your health, especially when you have diabetes. Diabetes may cause you to have problems because of poor blood flow (circulation) to your feet and legs, which can cause your skin to:  Become thinner and drier.  Break more easily.  Heal more slowly.  Peel and crack. You may also have nerve damage (neuropathy) in your legs and feet, causing decreased feeling in them. This means that you may not notice minor injuries to your feet that could lead to more serious problems. Noticing and addressing any potential problems early is the best way to prevent future foot problems. How to care for your feet Foot hygiene  Wash your feet daily with warm water and mild soap. Do not use hot water. Then, pat your feet and the areas between your toes until they are completely dry. Do not soak your feet as this can dry your skin.  Trim your toenails straight across. Do not dig under them or around the cuticle. File the edges of your nails with an emery board or nail file.  Apply a moisturizing lotion or petroleum jelly to the skin on your feet and to dry, brittle toenails. Use lotion that does not contain alcohol and is unscented. Do not apply lotion between your toes. Shoes and socks  Wear clean socks or stockings every day. Make sure they are not too tight. Do not wear knee-high stockings since they may decrease blood flow to your legs.  Wear shoes that fit properly and have enough cushioning. Always look in your shoes before you put them on to be sure there are no objects inside.  To break in new shoes, wear them for just a few hours a day. This prevents injuries on your feet. Wounds, scrapes, corns, and calluses  Check your feet daily for blisters, cuts, bruises, sores, and redness. If you cannot see the bottom of your feet, use a mirror or ask someone for help.  Do not cut corns or calluses or try to remove them with medicine.  If  you find a minor scrape, cut, or break in the skin on your feet, keep it and the skin around it clean and dry. You may clean these areas with mild soap and water. Do not clean the area with peroxide, alcohol, or iodine.  If you have a wound, scrape, corn, or callus on your foot, look at it several times a day to make sure it is healing and not infected. Check for: ? Redness, swelling, or pain. ? Fluid or blood. ? Warmth. ? Pus or a bad smell. General instructions  Do not cross your legs. This may decrease blood flow to your feet.  Do not use heating pads or hot water bottles on your feet. They may burn your skin. If you have lost feeling in your feet or legs, you may not know this is happening until it is too late.  Protect your feet from hot and cold by wearing shoes, such as at the beach or on hot pavement.  Schedule a complete foot exam at least once a year (annually) or more often if you have foot problems. If you have foot problems, report any cuts, sores, or bruises to your health care provider immediately. Contact a health care provider if:  You have a medical condition that increases your risk of infection and you have any cuts, sores, or bruises on your feet.  You have an injury that  is not healing.  You have redness on your legs or feet.  You feel burning or tingling in your legs or feet.  You have pain or cramps in your legs and feet.  Your legs or feet are numb.  Your feet always feel cold.  You have pain around a toenail. Get help right away if:  You have a wound, scrape, corn, or callus on your foot and: ? You have pain, swelling, or redness that gets worse. ? You have fluid or blood coming from the wound, scrape, corn, or callus. ? Your wound, scrape, corn, or callus feels warm to the touch. ? You have pus or a bad smell coming from the wound, scrape, corn, or callus. ? You have a fever. ? You have a red line going up your leg. Summary  Check your feet every  day for cuts, sores, red spots, swelling, and blisters.  Moisturize feet and legs daily.  Wear shoes that fit properly and have enough cushioning.  If you have foot problems, report any cuts, sores, or bruises to your health care provider immediately.  Schedule a complete foot exam at least once a year (annually) or more often if you have foot problems. This information is not intended to replace advice given to you by your health care provider. Make sure you discuss any questions you have with your health care provider. Document Revised: 12/11/2018 Document Reviewed: 04/21/2016 Elsevier Patient Education  [employer]. Rash, Adult  A rash is a change in the color of your skin. A rash can also change the way your skin feels. There are many different conditions and factors that can cause a rash. Follow these instructions at home: The goal of treatment is to stop the itching and keep the rash from spreading. Watch for any changes in your symptoms. Let your doctor know about them. Follow these instructions to help with your condition: Medicine Take or apply over-the-counter and prescription medicines only as told by your doctor. These may include medicines:  To treat red or swollen skin (corticosteroid creams).  To treat itching.  To treat an allergy (oral antihistamines).  To treat very bad symptoms (oral corticosteroids).  Skin care  Put cool cloths (compresses) on the affected areas.  Do not scratch or rub your skin.  Avoid covering the rash. Make sure that the rash is exposed to air as much as possible. Managing itching and discomfort  Avoid hot showers or baths. These can make itching worse. A cold shower may help.  Try taking a bath with: ? Epsom salts. You can get these at your local pharmacy or grocery store. Follow the instructions on the package. ? Baking soda. Pour a small amount into the bath as told by your doctor. ? Colloidal oatmeal. You can get this at  your local pharmacy or grocery store. Follow the instructions on the package.  Try putting baking soda paste onto your skin. Stir water into baking soda until it gets like a paste.  Try putting on a lotion that relieves itchiness (calamine lotion).  Keep cool and out of the sun. Sweating and being hot can make itching worse. General instructions   Rest as needed.  Drink enough fluid to keep your pee (urine) pale yellow.  Wear loose-fitting clothing.  Avoid scented soaps, detergents, and perfumes. Use gentle soaps, detergents, perfumes, and other cosmetic products.  Avoid anything that causes your rash. Keep a journal to help track what causes your rash. Write down: ? What  you eat. ? What cosmetic products you use. ? What you drink. ? What you wear. This includes jewelry.  Keep all follow-up visits as told by your doctor. This is important. Contact a doctor if:  You sweat at night.  You lose weight.  You pee (urinate) more than normal.  You pee less than normal, or you notice that your pee is a darker color than normal.  You feel weak.  You throw up (vomit).  Your skin or the whites of your eyes look yellow (jaundice).  Your skin: ? Tingles. ? Is numb.  Your rash: ? Does not go away after a few days. ? Gets worse.  You are: ? More thirsty than normal. ? More tired than normal.  You have: ? New symptoms. ? Pain in your belly (abdomen). ? A fever. ? Watery poop (diarrhea). Get help right away if:  You have a fever and your symptoms suddenly get worse.  You start to feel mixed up (confused).  You have a very bad headache or a stiff neck.  You have very bad joint pains or stiffness.  You have jerky movements that you cannot control (seizure).  Your rash covers all or most of your body. The rash may or may not be painful.  You have blisters that: ? Are on top of the rash. ? Grow larger. ? Grow together. ? Are painful. ? Are inside your nose or  mouth.  You have a rash that: ? Looks like purple pinprick-sized spots all over your body. ? Has a "bull's eye" or looks like a target. ? Is red and painful, causes your skin to peel, and is not from being in the sun too long. Summary  A rash is a change in the color of your skin. A rash can also change the way your skin feels.  The goal of treatment is to stop the itching and keep the rash from spreading.  Take or apply over-the-counter and prescription medicines only as told by your doctor.  Contact a doctor if you have new symptoms or symptoms that get worse.  Keep all follow-up visits as told by your doctor. This is important. This information is not intended to replace advice given to you by your health care provider. Make sure you discuss any questions you have with your health care provider. Document Revised: 07/12/2018 Document Reviewed: 10/22/2017 Elsevier Patient Education  [place]. Peripheral Vascular Disease Peripheral vascular disease (PVD) is a disease of the blood vessels. A simple term for PVD is poor circulation. In most cases, PVD narrows the blood vessels that carry blood from your heart to the rest of your body. This can result in a decreased supply of blood to your arms, legs, and internal organs, like your stomach or kidneys. However, it most often affects a person's lower legs and feet. There are two types of PVD.  Organic PVD. This is the more common type. It is caused by damage to the structure of blood vessels.  Functional PVD. This is caused by conditions that make blood vessels contract and tighten (spasm). Without treatment, PVD tends to get worse over time. PVD can also lead to acute limb ischemia. This is when an arm or leg suddenly has trouble getting enough blood. This is a medical emergency. What are the causes?  Each type of PVD has many different causes. The most common cause of PVD is buildup of a fatty material (plaque) inside your  arteries (atherosclerosis). Small amounts of  plaque can break off from the walls of the blood vessels and become lodged in a smaller artery. This blocks blood flow and can cause acute limb ischemia. Other common causes of PVD include:  Blood clots that form inside of blood vessels.  Injuries to blood vessels.  Diseases that cause inflammation of blood vessels or cause blood vessel spasms.  Health behaviors and health history that increase your risk of developing PVD. What increases the risk? You are more likely to develop this condition if:  You have a family history of PVD.  You have certain medical conditions, including: ? High cholesterol. ? Diabetes. ? High blood pressure (hypertension). ? Coronary heart disease. ? Past problems with blood clots. ? Past injury, such as burns or a broken bone. These may have damaged blood vessels in your limbs. ? Buerger disease. This is caused by inflamed blood vessels in your hands and feet. ? Some forms of arthritis. ? Rare birth defects that affect the arteries in your legs. ? Kidney disease.  You use tobacco or smoke.  You do not get enough exercise.  You are obese.  You are age 68 or older. What are the signs or symptoms? This condition may cause different symptoms. Your symptoms depend on what part of your body is not getting enough blood. Some common signs and symptoms include:  Cramps in your lower legs. This may be a symptom of poor leg circulation (claudication).  Pain and weakness in your legs. This happens while you are physically active but goes away when you rest (intermittent claudication).  Leg pain when at rest.  Leg numbness, tingling, or weakness.  Coldness in a leg or foot, especially when compared with the other leg.  Skin or hair changes. These can include: ? Hair loss. ? Shiny skin. ? Pale or bluish skin. ? Thick toenails.  Inability to get or maintain an erection (erectile  dysfunction).  Fatigue. People with PVD are more likely to develop ulcers and sores on their toes, feet, or legs. These may take longer than normal to heal. How is this diagnosed? This condition is diagnosed based on:  Your signs and symptoms.  A physical exam and your medical history.  Other tests to find out what is causing your PVD and to determine its severity. Tests may include: ? Blood pressure recordings from your arms and legs and measurements of the strength of your pulses (pulse volume recordings). ? Imaging studies using sound waves to take pictures of the blood flow through your blood vessels (Doppler ultrasound). ? Injecting a dye into your blood vessels before having imaging studies using:  X-rays (angiogram or arteriogram).  Computer-generated X-rays (CT angiogram).  A powerful electromagnetic field and a computer (magnetic resonance angiogram or MRA). How is this treated? Treatment for PVD depends on the cause of your condition and how severe your symptoms are. It also depends on your age. Underlying causes need to be treated and controlled. These include long-term (chronic) conditions, such as diabetes, high cholesterol, and high blood pressure. Treatment includes:  Lifestyle changes, such as: ? Quitting smoking. ? Exercising regularly. ? Following a low-fat, low-cholesterol diet.  Taking medicines, such as: ? Blood thinners to prevent blood clots. ? Medicines to improve blood flow. ? Medicines to improve your blood cholesterol levels.  Surgical procedures, such as: ? A procedure that uses an inflated balloon to open a blocked artery and improve blood flow (angioplasty). ? A procedure to put in a wire mesh tube to keep  a blocked artery open (stent implant). ? Surgery to reroute blood flow around a blocked artery (peripheral bypass surgery). ? Surgery to remove dead tissue from an infected wound on the affected limb. ? Amputation. This is surgical removal of the  affected limb. It may be necessary in cases of acute limb ischemia where there has been no improvement through medical or surgical treatments. Follow these instructions at home: Lifestyle  Do not use any products that contain nicotine or tobacco, such as cigarettes and e-cigarettes. If you need help quitting, ask your health care provider.  Lose weight if you are overweight, and maintain a healthy weight as discussed by your health care provider.  Eat a diet that is low in fat and cholesterol. If you need help, ask your health care provider.  Exercise regularly. Ask your health care provider to suggest some good activities for you. General instructions  Take over-the-counter and prescription medicines only as told by your health care provider.  Take good care of your feet: ? Wear comfortable shoes that fit well. ? Check your feet often for any cuts or sores.  Keep all follow-up visits as told by your health care provider. This is important. Contact a health care provider if:  You have cramps in your legs while walking.  You have leg pain when you are at rest.  You have coldness in a leg or foot.  Your skin changes.  You have erectile dysfunction.  You have cuts or sores on your feet that are not healing. Get help right away if:  Your arm or leg turns cold, numb, and blue.  Your arms or legs become red, warm, swollen, painful, or numb.  You have chest pain or trouble breathing.  You suddenly have weakness in your face, arm, or leg.  You become very confused or lose the ability to speak.  You suddenly have a very bad headache or lose your vision. Summary  Peripheral vascular disease (PVD) is a disease of the blood vessels.  In most cases, PVD narrows the blood vessels that carry blood from your heart to the rest of your body.  PVD may cause different symptoms. Your symptoms depend on what part of your body is not getting enough blood.  Treatment for PVD depends on  the cause of your condition and how severe your symptoms are. This information is not intended to replace advice given to you by your health care provider. Make sure you discuss any questions you have with your health care provider. Document Revised: 03/02/2017 Document Reviewed: 04/27/2016 Elsevier Patient Education  2020 [employer].

## 2019-05-29 NOTE — Progress Notes (Signed)
Subjective: Robert [name] presents today for follow up of preventative diabetic foot care and painful mycotic nails b/l that are difficult to trim. Pain interferes with ambulation. Aggravating factors include wearing enclosed shoe gear. Pain is relieved with periodic professional debridement.   He relates he has been trying to improve his diet and now A1c is 7.1, improved from 9.3 six months ago.  Patient states he has a rash on both feet and has been using Cortizone-10. He says it appears every winter. It's been about the same using the Cortizone-10.  Allergies  Allergen Reactions  . Rosuvastatin Other (See Comments)    Adverse GI symptoms     Objective: Vitals:   05/26/19 0818  Temp: 98 F (36.7 C)    Vascular Examination:  Capillary refill time to digits immediate b/l, faintly palpable DP pulses b/l, non-palpable PT pulses b/l, pedal hair sparse b/l and skin temperature gradient within normal limits b/l  Dermatological Examination: Pedal skin with normal turgor, texture and tone bilaterally, no open wounds bilaterally, no interdigital macerations bilaterally, toenails 1-5 b/l elongated, dystrophic, thickened, crumbly with subungual debris and hyperkeratotic lesion(s) submet head 5 b/l.  No erythema, no edema, no drainage, no flocculence.  Skin eruption noted b/l aspect of both feet with lichenification and erythematous patches. No drainage, swelling or pain.   Musculoskeletal: Normal muscle strength 5/5 to all lower extremity muscle groups bilaterally, no gross bony deformities bilaterally and no pain crepitus or joint limitation noted with ROM b/l  Neurological: Protective sensation intact 5/5 intact bilaterally with 10g monofilament b/l and vibratory sensation intact b/l  Assessment: 1. Pain due to onychomycosis of toenails of both feet   2. Callus   3. Rash   4. Diabetes mellitus type 2 with peripheral artery disease (HCC)    Plan: -Continue diabetic foot care  principles. Literature dispensed on today.  -Toenails 1-5 b/l were debrided in length and girth with sterile nail nippers and dremel without iatrogenic bleeding. -Calluses were debrided without complication or incident. Total number debrided =2 submet head 5 b/l -Patient to continue soft, supportive shoe gear daily. -Patient to report any pedal injuries to medical professional immediately. --Rx sent to pharmacy for triamcinolone ointment 0.1% to be applied to both feet twice daily. -Patient/POA to call should there be question/concern in the interim.  Return in about 10 weeks (around 08/04/2019) for diabetic nail and callus trim.

## 2019-06-03 ENCOUNTER — Other Ambulatory Visit: Payer: Self-pay

## 2019-06-03 ENCOUNTER — Encounter: Payer: Self-pay | Admitting: Family Medicine

## 2019-06-03 ENCOUNTER — Ambulatory Visit (INDEPENDENT_AMBULATORY_CARE_PROVIDER_SITE_OTHER): Payer: Medicare Other | Admitting: Family Medicine

## 2019-06-03 VITALS — BP 118/68 | HR 69 | Temp 98.0°F | Ht 69.0 in | Wt 219.0 lb

## 2019-06-03 DIAGNOSIS — G8929 Other chronic pain: Secondary | ICD-10-CM

## 2019-06-03 DIAGNOSIS — E1122 Type 2 diabetes mellitus with diabetic chronic kidney disease: Secondary | ICD-10-CM

## 2019-06-03 DIAGNOSIS — E785 Hyperlipidemia, unspecified: Secondary | ICD-10-CM | POA: Diagnosis not present

## 2019-06-03 DIAGNOSIS — E669 Obesity, unspecified: Secondary | ICD-10-CM

## 2019-06-03 DIAGNOSIS — E1159 Type 2 diabetes mellitus with other circulatory complications: Secondary | ICD-10-CM

## 2019-06-03 DIAGNOSIS — N182 Chronic kidney disease, stage 2 (mild): Secondary | ICD-10-CM

## 2019-06-03 DIAGNOSIS — M79641 Pain in right hand: Secondary | ICD-10-CM

## 2019-06-03 DIAGNOSIS — M79642 Pain in left hand: Secondary | ICD-10-CM

## 2019-06-03 DIAGNOSIS — M25561 Pain in right knee: Secondary | ICD-10-CM

## 2019-06-03 DIAGNOSIS — I129 Hypertensive chronic kidney disease with stage 1 through stage 4 chronic kidney disease, or unspecified chronic kidney disease: Secondary | ICD-10-CM

## 2019-06-03 MED ORDER — PREDNISONE 10 MG PO TABS: 10.0000 mg | ORAL_TABLET | Freq: Two times a day (BID) | ORAL | 0 refills | Status: DC

## 2019-06-03 NOTE — Assessment & Plan Note (Signed)
Controlled, no change in medication DASH diet and commitment to daily physical activity for a minimum of 30 minutes discussed and encouraged, as a part of hypertension management. The importance of attaining a healthy weight is also discussed.  BP/Weight 06/03/2019 04/09/2019 02/12/2019 02/12/2019 01/01/2019 12/25/2018 [id number]  Systolic BP 123456 - 123456 123456 [id number] - -  Diastolic BP 68 - 70 70 72 - -  Wt. (Lbs) 219 218 208 208 213 214 212  BMI 32.34 32.19 30.72 30.72 31.45 31.6 31.31

## 2019-06-03 NOTE — Patient Instructions (Addendum)
F/U last week in May, call if you need me sooner  Non fasting HBA1C, chem 7 and EGFr  5  days before visit  Please reduce lantus to 45 units daily, if too high gradually return to current 50, example, start 45, then 47/48 if needed  It is important that you exercise regularly at least 30 minutes 5 times a week. If you develop chest pain, have severe difficulty breathing, or feel very tired, stop exercising immediately and seek medical attention   CONGRATS on [facility]  5 days of prednisone are prescribed for joint pain , if one nce daily makes a difference , take only one daily  This will temporarily raise your blood sugar   Thanks for choosing [facility], we consider it a privelige to serve you.

## 2019-06-03 NOTE — Progress Notes (Signed)
Robert [name]     MRN: [id number]      DOB: 1950/08/17   HPI Mr. Bartek is here for follow up and re-evaluation of chronic medical conditions, medication management and review of any available recent lab and radiology data.  Preventive health is updated, specifically  Cancer screening and Immunization.   Questions or concerns regarding consultations or procedures which the PT has had in the interim are  addressed. The PT denies any adverse reactions to current medications since the last visit.  C/o pain and stiffness of finger joints as well as knees and back  ROS Denies recent fever or chills. Denies sinus pressure, nasal congestion, ear pain or sore throat. Denies chest congestion, productive cough or wheezing. Denies chest pains, palpitations and leg swelling Denies abdominal pain, nausea, vomiting,diarrhea or constipation.   Denies dysuria, frequency, hesitancy or incontinence. . Denies headaches, seizures, numbness, or tingling. Denies depression, anxiety or insomnia. Denies skin break down or rash. Denies polyuria, polydipsia, blurred vision , has ahd a few  hypoglycemic episodes.    PE  BP 118/68 (BP Location: Left Arm, Patient Position: Sitting)   Pulse 69   Temp 98 F (36.7 C) (Temporal)   Ht 5\' 9"  (1.753 m)   Wt 219 lb (99.3 kg)   SpO2 95%   BMI 32.34 kg/m   Patient alert and oriented and in no cardiopulmonary distress.  HEENT: No facial asymmetry, EOMI,     Neck supple .  Chest: Clear to auscultation bilaterally.  CVS: S1, S2 no murmurs, no S3.Regular rate.  ABD: Soft non tender.   Ext: No edema  MS: Adequate though reduced ROM spine, shoulders, hips and knees.deformity and stiffness of finger joints  Skin: Intact, no ulcerations or rash noted.  Psych: Good eye contact, normal affect. Memory intact not anxious or depressed appearing.  CNS: CN 2-12 intact, power,  normal throughout.no focal deficits noted.   Assessment & Plan  Hypertension  associated with stage 2 chronic kidney disease due to type 2 diabetes mellitus Controlled, no change in medication DASH diet and commitment to daily physical activity for a minimum of 30 minutes discussed and encouraged, as a part of hypertension management. The importance of attaining a healthy weight is also discussed.  BP/Weight 06/03/2019 04/09/2019 02/12/2019 02/12/2019 01/01/2019 12/25/2018 [id number]  Systolic BP 123456 - 123456 123456 [id number] - -  Diastolic BP 68 - 70 70 72 - -  Wt. (Lbs) 219 218 208 208 213 214 212  BMI 32.34 32.19 30.72 30.72 31.45 31.6 31.31       Type 2 diabetes mellitus with vascular disease (HCC) Controlled, no change in medication Mr. Caple is reminded of the importance of commitment to daily physical activity for 30 minutes or more, as able and the need to limit carbohydrate intake to 30 to 60 grams per meal to help with blood sugar control.   The need to take medication as prescribed, test blood sugar as directed, and to call between visits if there is a concern that blood sugar is uncontrolled is also discussed.   Mr. Woltering is reminded of the importance of daily foot exam, annual eye examination, and good blood sugar, blood pressure and cholesterol control.  Diabetic Labs Latest Ref Rng & Units 05/15/2019 03/05/2019 02/07/2019 01/16/2019 11/04/2018  HbA1c <5.7 % of total Hgb 7.1(H) - - 6.7 9.3(H)  Microalbumin Not Estab. ug/mL - 40.7(H) - - -  Micro/Creat Ratio 0 - 29 mg/g creat - 22 - - -  Chol <200 mg/dL 117 - - - 117  HDL > OR = 40 mg/dL 38(L) - - - 31(L)  Calc LDL mg/dL (calc) 63 - - - 61  Triglycerides <150 mg/dL 84 - - - 178(H)  Creatinine 0.70 - 1.25 mg/dL 1.25 - 1.25 - 1.09   BP/Weight 06/03/2019 04/09/2019 02/12/2019 02/12/2019 01/01/2019 12/25/2018 [id number]  Systolic BP 123456 - 123456 123456 [id number] - -  Diastolic BP 68 - 70 70 72 - -  Wt. (Lbs) 219 218 208 208 213 214 212  BMI 32.34 32.19 30.72 30.72 31.45 31.6 31.31   Foot/eye exam completion dates Latest Ref Rng & Units  11/05/2018 05/30/2018  Eye Exam No Retinopathy - No Retinopathy  Foot exam Order - - -  Foot Form Completion - Done -   Due to hypoglycemia , reduce lantus dose gradually     Obesity (BMI 30.0-34.9)  Patient re-educated about  the importance of commitment to a  minimum of 150 minutes of exercise per week as able.  The importance of healthy food choices with portion control discussed, as well as eating regularly and within a 12 hour window most days. The need to choose "clean , green" food 50 to 75% of the time is discussed, as well as to make water the primary drink and set a goal of 64 ounces water daily.    Weight /BMI 06/03/2019 04/09/2019 02/12/2019  WEIGHT 219 lb 218 lb 208 lb  HEIGHT 5\' 9"  5\' 9"  5\' 9"   BMI 32.34 kg/m2 32.19 kg/m2 30.72 kg/m2      Hyperlipidemia LDL goal <70 Hyperlipidemia:Low fat diet discussed and encouraged.   Lipid Panel  Lab Results  Component Value Date   CHOL 117 05/15/2019   HDL 38 (L) 05/15/2019   LDLCALC 63 05/15/2019   TRIG 84 05/15/2019   CHOLHDL 3.1 05/15/2019  improving , need to increase exercise      Bilateral hand pain Short  Course of prednisone is prescribed due to increased and uncontrolled pain  Knee pain, right Surgery recommended, pt waiting on this

## 2019-06-03 NOTE — Assessment & Plan Note (Signed)
  Patient re-educated about  the importance of commitment to a  minimum of 150 minutes of exercise per week as able.  The importance of healthy food choices with portion control discussed, as well as eating regularly and within a 12 hour window most days. The need to choose "clean , green" food 50 to 75% of the time is discussed, as well as to make water the primary drink and set a goal of 64 ounces water daily.    Weight /BMI 06/03/2019 04/09/2019 02/12/2019  WEIGHT 219 lb 218 lb 208 lb  HEIGHT 5\' 9"  5\' 9"  5\' 9"   BMI 32.34 kg/m2 32.19 kg/m2 30.72 kg/m2

## 2019-06-03 NOTE — Assessment & Plan Note (Addendum)
Short  Course of prednisone is prescribed due to increased and uncontrolled pain

## 2019-06-03 NOTE — Assessment & Plan Note (Signed)
Surgery recommended, pt waiting on this

## 2019-06-03 NOTE — Assessment & Plan Note (Signed)
Hyperlipidemia:Low fat diet discussed and encouraged.   Lipid Panel  Lab Results  Component Value Date   CHOL 117 05/15/2019   HDL 38 (L) 05/15/2019   LDLCALC 63 05/15/2019   TRIG 84 05/15/2019   CHOLHDL 3.1 05/15/2019  improving , need to increase exercise

## 2019-06-03 NOTE — Assessment & Plan Note (Signed)
Controlled, no change in medication Robert [name] is reminded of the importance of commitment to daily physical activity for 30 minutes or more, as able and the need to limit carbohydrate intake to 30 to 60 grams per meal to help with blood sugar control.   The need to take medication as prescribed, test blood sugar as directed, and to call between visits if there is a concern that blood sugar is uncontrolled is also discussed.   Robert [name] is reminded of the importance of daily foot exam, annual eye examination, and good blood sugar, blood pressure and cholesterol control.  Diabetic Labs Latest Ref Rng & Units 05/15/2019 03/05/2019 02/07/2019 01/16/2019 11/04/2018  HbA1c <5.7 % of total Hgb 7.1(H) - - 6.7 9.3(H)  Microalbumin Not Estab. ug/mL - 40.7(H) - - -  Micro/Creat Ratio 0 - 29 mg/g creat - 22 - - -  Chol <200 mg/dL 117 - - - 117  HDL > OR = 40 mg/dL 38(L) - - - 31(L)  Calc LDL mg/dL (calc) 63 - - - 61  Triglycerides <150 mg/dL 84 - - - 178(H)  Creatinine 0.70 - 1.25 mg/dL 1.25 - 1.25 - 1.09   BP/Weight 06/03/2019 04/09/2019 02/12/2019 02/12/2019 01/01/2019 12/25/2018 [id number]  Systolic BP 123456 - 123456 123456 [id number] - -  Diastolic BP 68 - 70 70 72 - -  Wt. (Lbs) 219 218 208 208 213 214 212  BMI 32.34 32.19 30.72 30.72 31.45 31.6 31.31   Foot/eye exam completion dates Latest Ref Rng & Units 11/05/2018 05/30/2018  Eye Exam No Retinopathy - No Retinopathy  Foot exam Order - - -  Foot Form Completion - Done -   Due to hypoglycemia , reduce lantus dose gradually

## 2019-06-04 ENCOUNTER — Ambulatory Visit: Payer: Medicare Other | Admitting: Family Medicine

## 2019-06-18 LAB — HM DIABETES EYE EXAM

## 2019-06-24 ENCOUNTER — Other Ambulatory Visit: Payer: Self-pay | Admitting: Family Medicine

## 2019-07-19 ENCOUNTER — Other Ambulatory Visit: Payer: Self-pay | Admitting: Family Medicine

## 2019-08-05 ENCOUNTER — Encounter: Payer: Self-pay | Admitting: Podiatry

## 2019-08-05 ENCOUNTER — Other Ambulatory Visit: Payer: Self-pay

## 2019-08-05 ENCOUNTER — Ambulatory Visit: Payer: Medicare Other | Admitting: Podiatry

## 2019-08-05 VITALS — Temp 96.5°F

## 2019-08-05 DIAGNOSIS — M79674 Pain in right toe(s): Secondary | ICD-10-CM | POA: Diagnosis not present

## 2019-08-05 DIAGNOSIS — R21 Rash and other nonspecific skin eruption: Secondary | ICD-10-CM

## 2019-08-05 DIAGNOSIS — L309 Dermatitis, unspecified: Secondary | ICD-10-CM

## 2019-08-05 DIAGNOSIS — B351 Tinea unguium: Secondary | ICD-10-CM | POA: Diagnosis not present

## 2019-08-05 DIAGNOSIS — E1151 Type 2 diabetes mellitus with diabetic peripheral angiopathy without gangrene: Secondary | ICD-10-CM

## 2019-08-05 DIAGNOSIS — L84 Corns and callosities: Secondary | ICD-10-CM | POA: Diagnosis not present

## 2019-08-05 DIAGNOSIS — M79675 Pain in left toe(s): Secondary | ICD-10-CM | POA: Diagnosis not present

## 2019-08-05 MED ORDER — TRIAMCINOLONE ACETONIDE 0.1 % EX OINT: 1.0000 "application " | TOPICAL_OINTMENT | Freq: Two times a day (BID) | CUTANEOUS | 0 refills | Status: DC

## 2019-08-05 NOTE — Patient Instructions (Signed)
Diabetes Mellitus and Foot Care Foot care is an important part of your health, especially when you have diabetes. Diabetes may cause you to have problems because of poor blood flow (circulation) to your feet and legs, which can cause your skin to:  Become thinner and drier.  Break more easily.  Heal more slowly.  Peel and crack. You may also have nerve damage (neuropathy) in your legs and feet, causing decreased feeling in them. This means that you may not notice minor injuries to your feet that could lead to more serious problems. Noticing and addressing any potential problems early is the best way to prevent future foot problems. How to care for your feet Foot hygiene  Wash your feet daily with warm water and mild soap. Do not use hot water. Then, pat your feet and the areas between your toes until they are completely dry. Do not soak your feet as this can dry your skin.  Trim your toenails straight across. Do not dig under them or around the cuticle. File the edges of your nails with an emery board or nail file.  Apply a moisturizing lotion or petroleum jelly to the skin on your feet and to dry, brittle toenails. Use lotion that does not contain alcohol and is unscented. Do not apply lotion between your toes. Shoes and socks  Wear clean socks or stockings every day. Make sure they are not too tight. Do not wear knee-high stockings since they may decrease blood flow to your legs.  Wear shoes that fit properly and have enough cushioning. Always look in your shoes before you put them on to be sure there are no objects inside.  To break in new shoes, wear them for just a few hours a day. This prevents injuries on your feet. Wounds, scrapes, corns, and calluses  Check your feet daily for blisters, cuts, bruises, sores, and redness. If you cannot see the bottom of your feet, use a mirror or ask someone for help.  Do not cut corns or calluses or try to remove them with medicine.  If you  find a minor scrape, cut, or break in the skin on your feet, keep it and the skin around it clean and dry. You may clean these areas with mild soap and water. Do not clean the area with peroxide, alcohol, or iodine.  If you have a wound, scrape, corn, or callus on your foot, look at it several times a day to make sure it is healing and not infected. Check for: ? Redness, swelling, or pain. ? Fluid or blood. ? Warmth. ? Pus or a bad smell. General instructions  Do not cross your legs. This may decrease blood flow to your feet.  Do not use heating pads or hot water bottles on your feet. They may burn your skin. If you have lost feeling in your feet or legs, you may not know this is happening until it is too late.  Protect your feet from hot and cold by wearing shoes, such as at the beach or on hot pavement.  Schedule a complete foot exam at least once a year (annually) or more often if you have foot problems. If you have foot problems, report any cuts, sores, or bruises to your health care provider immediately. Contact a health care provider if:  You have a medical condition that increases your risk of infection and you have any cuts, sores, or bruises on your feet.  You have an injury that is not   healing.  You have redness on your legs or feet.  You feel burning or tingling in your legs or feet.  You have pain or cramps in your legs and feet.  Your legs or feet are numb.  Your feet always feel cold.  You have pain around a toenail. Get help right away if:  You have a wound, scrape, corn, or callus on your foot and: ? You have pain, swelling, or redness that gets worse. ? You have fluid or blood coming from the wound, scrape, corn, or callus. ? Your wound, scrape, corn, or callus feels warm to the touch. ? You have pus or a bad smell coming from the wound, scrape, corn, or callus. ? You have a fever. ? You have a red line going up your leg. Summary  Check your feet every day  for cuts, sores, red spots, swelling, and blisters.  Moisturize feet and legs daily.  Wear shoes that fit properly and have enough cushioning.  If you have foot problems, report any cuts, sores, or bruises to your health care provider immediately.  Schedule a complete foot exam at least once a year (annually) or more often if you have foot problems. This information is not intended to replace advice given to you by your health care provider. Make sure you discuss any questions you have with your health care provider. Document Revised: 12/11/2018 Document Reviewed: 04/21/2016 Elsevier Patient Education  2020 [employer]  Corns and Calluses Corns are small areas of thickened skin that occur on the top, sides, or tip of a toe. They contain a cone-shaped core with a point that can press on a nerve below. This causes pain.  Calluses are areas of thickened skin that can occur anywhere on the body, including the hands, fingers, palms, soles of the feet, and heels. Calluses are usually larger than corns. What are the causes? Corns and calluses are caused by rubbing (friction) or pressure, such as from shoes that are too tight or do not fit properly. What increases the risk? Corns are more likely to develop in people who have misshapen toes (toe deformities), such as hammer toes. Calluses can occur with friction to any area of the skin. They are more likely to develop in people who:  Work with their hands.  Wear shoes that fit poorly, are too tight, or are high-heeled.  Have toe deformities. What are the signs or symptoms? Symptoms of a corn or callus include:  A hard growth on the skin.  Pain or tenderness under the skin.  Redness and swelling.  Increased discomfort while wearing tight-fitting shoes, if your feet are affected. If a corn or callus becomes infected, symptoms may include:  Redness and swelling that gets worse.  Pain.  Fluid, blood, or pus draining from the corn or  callus. How is this diagnosed? Corns and calluses may be diagnosed based on your symptoms, your medical history, and a physical exam. How is this treated? Treatment for corns and calluses may include:  Removing the cause of the friction or pressure. This may involve: ? Changing your shoes. ? Wearing shoe inserts (orthotics) or other protective layers in your shoes, such as a corn pad. ? Wearing gloves.  Applying medicine to the skin (topical medicine) to help soften skin in the hardened, thickened areas.  Removing layers of dead skin with a file to reduce the size of the corn or callus.  Removing the corn or callus with a scalpel or laser.  Taking   antibiotic medicines, if your corn or callus is infected.  Having surgery, if a toe deformity is the cause. Follow these instructions at home:   Take over-the-counter and prescription medicines only as told by your health care provider.  If you were prescribed an antibiotic, take it as told by your health care provider. Do not stop taking it even if your condition starts to improve.  Wear shoes that fit well. Avoid wearing high-heeled shoes and shoes that are too tight or too loose.  Wear any padding, protective layers, gloves, or orthotics as told by your health care provider.  Soak your hands or feet and then use a file or pumice stone to soften your corn or callus. Do this as told by your health care provider.  Check your corn or callus every day for symptoms of infection. Contact a health care provider if you:  Notice that your symptoms do not improve with treatment.  Have redness or swelling that gets worse.  Notice that your corn or callus becomes painful.  Have fluid, blood, or pus coming from your corn or callus.  Have new symptoms. Summary  Corns are small areas of thickened skin that occur on the top, sides, or tip of a toe.  Calluses are areas of thickened skin that can occur anywhere on the body, including the  hands, fingers, palms, and soles of the feet. Calluses are usually larger than corns.  Corns and calluses are caused by rubbing (friction) or pressure, such as from shoes that are too tight or do not fit properly.  Treatment may include wearing any padding, protective layers, gloves, or orthotics as told by your health care provider. This information is not intended to replace advice given to you by your health care provider. Make sure you discuss any questions you have with your health care provider. Document Revised: 07/10/2018 Document Reviewed: 01/31/2017 Elsevier Patient Education  2020 [employer] Inc.  

## 2019-08-07 NOTE — Progress Notes (Addendum)
Subjective: Robert [name] presents today for follow up of preventative diabetic foot care and callus(es) b/l feet and painful mycotic toenails b/l that are difficult to trim. Pain interferes with ambulation. Aggravating factors include wearing enclosed shoe gear. Pain is relieved with periodic professional debridement.  Pt is requesting refill of triamcinolone ointment stating it really helped his feet.  He voices no new pedal concerns on today's visit.   Allergies  Allergen Reactions  . Rosuvastatin Other (See Comments)    Adverse GI symptoms     Objective: Vitals:   08/05/19 0831  Temp: (!) 96.5 F (35.8 C)    Pt 69 y.o. year old AA male  in NAD. AAO x 3.   Vascular Examination:  Capillary refill time to digits immediate b/l. Faintly palpable DP pulses b/l. Nonpalpable PT pulses b/l. Pedal hair absent b/l Skin temperature gradient within normal limits b/l. No edema noted b/l.  Dermatological Examination: Pedal skin with normal turgor, texture and tone bilaterally. No open wounds bilaterally. No interdigital macerations bilaterally. Toenails 1-5 b/l elongated, dystrophic, thickened, crumbly with subungual debris and tenderness to dorsal palpation. Hyperkeratotic lesion(s) submet head 5 left foot.  No erythema, no edema, no drainage, no flocculence. Evidence of chronic venous insufficiency b/l LE. Skin changes consistent with venous stasis noted b/l LE.  Musculoskeletal: Normal muscle strength 5/5 to all lower extremity muscle groups bilaterally. No gross bony deformities bilaterally. No pain crepitus or joint limitation noted with ROM b/l. Eczematous skin patches noted b/l feet. No blistering, no erythema, no edema, no drainage.  Neurological: Protective sensation intact 5/5 intact bilaterally with 10g monofilament b/l. Vibratory sensation intact b/l. Proprioception intact bilaterally.  Assessment: 1. Pain due to onychomycosis of toenails of both feet   2. Eczema, unspecified  type   3. Callus   4. Diabetes mellitus type 2 with peripheral artery disease (HCC)    Plan: -Continue diabetic foot care principles. Literature dispensed on today.  -Toenails 1-5 b/l were debrided in length and girth with sterile nail nippers and dremel without iatrogenic bleeding.  -Callus(es) submet head 5 left foot pared utilizing sterile scalpel blade without complication or incident. Total number debrided =1. -Patient to continue soft, supportive shoe gear daily. -Patient to report any pedal injuries to medical professional immediately. -Triamcinolone Ointment 0.1% refilled. Apply to affected area twice daily. -Patient/POA to call should there be question/concern in the interim.  Return in about 9 weeks (around 10/07/2019) for diabetic nail and callus trim.  Marzetta Board, DPM

## 2019-08-22 ENCOUNTER — Other Ambulatory Visit: Payer: Self-pay | Admitting: Family Medicine

## 2019-08-26 ENCOUNTER — Ambulatory Visit: Payer: Medicare Other | Admitting: Family Medicine

## 2019-08-29 DIAGNOSIS — E1122 Type 2 diabetes mellitus with diabetic chronic kidney disease: Secondary | ICD-10-CM | POA: Diagnosis not present

## 2019-08-29 DIAGNOSIS — I129 Hypertensive chronic kidney disease with stage 1 through stage 4 chronic kidney disease, or unspecified chronic kidney disease: Secondary | ICD-10-CM | POA: Diagnosis not present

## 2019-08-29 DIAGNOSIS — E1159 Type 2 diabetes mellitus with other circulatory complications: Secondary | ICD-10-CM | POA: Diagnosis not present

## 2019-08-29 DIAGNOSIS — N182 Chronic kidney disease, stage 2 (mild): Secondary | ICD-10-CM | POA: Diagnosis not present

## 2019-08-30 LAB — HEMOGLOBIN A1C
Hgb A1c MFr Bld: 6.5 % of total Hgb — ABNORMAL HIGH (ref <5.7)
Mean Plasma Glucose: 140 (calc)
eAG (mmol/L): 7.7 (calc)

## 2019-08-30 LAB — COMPLETE METABOLIC PANEL WITH GFR
AG Ratio: 1.4 (calc) (ref 1.0–2.5)
ALT: 25 U/L (ref 9–46)
AST: 22 U/L (ref 10–35)
Albumin: 4.1 g/dL (ref 3.6–5.1)
Alkaline phosphatase (APISO): 115 U/L (ref 35–144)
BUN/Creatinine Ratio: 13 (calc) (ref 6–22)
BUN: 16 mg/dL (ref 7–25)
CO2: 25 mmol/L (ref 20–32)
Calcium: 9.2 mg/dL (ref 8.6–10.3)
Chloride: 107 mmol/L (ref 98–110)
Creat: 1.28 mg/dL — ABNORMAL HIGH (ref 0.70–1.25)
GFR, Est African American: 67 mL/min/{1.73_m2} (ref >=60)
GFR, Est Non African American: 58 mL/min/{1.73_m2} — ABNORMAL LOW (ref >=60)
Globulin: 2.9 g/dL (calc) (ref 1.9–3.7)
Glucose, Bld: 131 mg/dL — ABNORMAL HIGH (ref 65–99)
Potassium: 4.6 mmol/L (ref 3.5–5.3)
Sodium: 141 mmol/L (ref 135–146)
Total Bilirubin: 0.3 mg/dL (ref 0.2–1.2)
Total Protein: 7 g/dL (ref 6.1–8.1)

## 2019-09-02 ENCOUNTER — Encounter: Payer: Self-pay | Admitting: Family Medicine

## 2019-09-03 ENCOUNTER — Encounter: Payer: Self-pay | Admitting: Family Medicine

## 2019-09-03 ENCOUNTER — Other Ambulatory Visit: Payer: Self-pay

## 2019-09-03 ENCOUNTER — Ambulatory Visit (INDEPENDENT_AMBULATORY_CARE_PROVIDER_SITE_OTHER): Payer: Medicare Other | Admitting: Family Medicine

## 2019-09-03 VITALS — BP 126/74 | HR 70 | Temp 97.5°F | Resp 15 | Ht 69.0 in | Wt 220.0 lb

## 2019-09-03 DIAGNOSIS — Z125 Encounter for screening for malignant neoplasm of prostate: Secondary | ICD-10-CM

## 2019-09-03 DIAGNOSIS — E785 Hyperlipidemia, unspecified: Secondary | ICD-10-CM

## 2019-09-03 DIAGNOSIS — E1122 Type 2 diabetes mellitus with diabetic chronic kidney disease: Secondary | ICD-10-CM | POA: Diagnosis not present

## 2019-09-03 DIAGNOSIS — I2510 Atherosclerotic heart disease of native coronary artery without angina pectoris: Secondary | ICD-10-CM

## 2019-09-03 DIAGNOSIS — N182 Chronic kidney disease, stage 2 (mild): Secondary | ICD-10-CM

## 2019-09-03 DIAGNOSIS — E669 Obesity, unspecified: Secondary | ICD-10-CM

## 2019-09-03 DIAGNOSIS — E1159 Type 2 diabetes mellitus with other circulatory complications: Secondary | ICD-10-CM | POA: Diagnosis not present

## 2019-09-03 DIAGNOSIS — I10 Essential (primary) hypertension: Secondary | ICD-10-CM | POA: Diagnosis not present

## 2019-09-03 DIAGNOSIS — I129 Hypertensive chronic kidney disease with stage 1 through stage 4 chronic kidney disease, or unspecified chronic kidney disease: Secondary | ICD-10-CM

## 2019-09-03 MED ORDER — GLIPIZIDE 5 MG PO TABS: 5.0000 mg | ORAL_TABLET | Freq: Every day | ORAL | 3 refills | Status: DC

## 2019-09-03 MED ORDER — METOPROLOL SUCCINATE ER 50 MG PO TB24: ORAL_TABLET | ORAL | 3 refills | Status: DC

## 2019-09-03 NOTE — Patient Instructions (Addendum)
Annual physical exam with mD in mid October , call if you need me before  Dose reduction in glipizide to 5 mg once daily, same insulin dose  Goal for fasting blood sugar ranges from 80 to 130 and 2 hours after any meal or at bedtime should be between 130 to 180.  Please check pharmacy for shingrix vaccines  (shingles), you need this, it is a 2 step vaccine   Fasting  lipid, cmp and eGFr, hBA1C and PSA and cBC 1 week before follow up  Keep up the golf, regular exrcise and stay away from sugar except in portion controlled fruit  Thanks for choosing [facility], we consider it a privelige to serve you.

## 2019-09-05 ENCOUNTER — Encounter: Payer: Self-pay | Admitting: Family Medicine

## 2019-09-05 NOTE — Assessment & Plan Note (Signed)
Controlled, no change in medication Robert [name] is reminded of the importance of commitment to daily physical activity for 30 minutes or more, as able and the need to limit carbohydrate intake to 30 to 60 grams per meal to help with blood sugar control.   The need to take medication as prescribed, test blood sugar as directed, and to call between visits if there is a concern that blood sugar is uncontrolled is also discussed.   [name] is reminded of the importance of daily foot exam, annual eye examination, and good blood sugar, blood pressure and cholesterol control.  Diabetic Labs Latest Ref Rng & Units 08/29/2019 05/15/2019 03/05/2019 02/07/2019 01/16/2019  HbA1c <5.7 % of total Hgb 6.5(H) 7.1(H) - - 6.7  Microalbumin Not Estab. ug/mL - - 40.7(H) - -  Micro/Creat Ratio 0 - 29 mg/g creat - - 22 - -  Chol <200 mg/dL - 117 - - -  HDL > OR = 40 mg/dL - 38(L) - - -  Calc LDL mg/dL (calc) - 63 - - -  Triglycerides <150 mg/dL - 84 - - -  Creatinine 0.70 - 1.25 mg/dL 1.28(H) 1.25 - 1.25 -   BP/Weight 09/03/2019 06/03/2019 04/09/2019 02/12/2019 02/12/2019 01/01/2019 0/53/9767  Systolic BP 341 937 - 902 409 735 -  Diastolic BP 74 68 - 70 70 72 -  Wt. (Lbs) 220 219 218 208 208 213 214  BMI 32.49 32.34 32.19 30.72 30.72 31.45 31.6   Foot/eye exam completion dates Latest Ref Rng & Units 06/18/2019 11/05/2018  Eye Exam No Retinopathy No Retinopathy -  Foot exam Order - - -  Foot Form Completion - - Done

## 2019-09-05 NOTE — Assessment & Plan Note (Signed)
Hyperlipidemia:Low fat diet discussed and encouraged.   Lipid Panel  Lab Results  Component Value Date   CHOL 117 05/15/2019   HDL 38 (L) 05/15/2019   LDLCALC 63 05/15/2019   TRIG 84 05/15/2019   CHOLHDL 3.1 05/15/2019   Need to increase exercise and lower fat intake

## 2019-09-05 NOTE — Progress Notes (Signed)
Robert [name]     MRN: [id number]      DOB: Jan 12, 1951   HPI Robert [name] is here for follow up and re-evaluation of chronic medical conditions, medication management and review of any available recent lab and radiology data.  Preventive health is updated, specifically  Cancer screening and Immunization.   Questions or concerns regarding consultations or procedures which the PT has had in the interim are  addressed. The PT denies any adverse reactions to current medications since the last visit.  There are no new concerns.  There are no specific complaints  Denies polyuria, polydipsia, blurred vision , or hypoglycemic episodes.  ROS Denies recent fever or chills. Denies sinus pressure, nasal congestion, ear pain or sore throat. Denies chest congestion, productive cough or wheezing. Denies chest pains, palpitations and leg swelling Denies abdominal pain, nausea, vomiting,diarrhea or constipation.   Denies dysuria, frequency, hesitancy or incontinence. Denies uncontrolled  joint pain, swelling and limitation in mobility. Denies headaches, seizures, numbness, or tingling. Denies depression, anxiety or insomnia. Denies skin break down or rash.   PE  BP 126/74   Pulse 70   Temp (!) 97.5 F (36.4 C) (Temporal)   Resp 15   Ht 5\' 9"  (1.753 m)   Wt 220 lb (99.8 kg)   SpO2 97%   BMI 32.49 kg/m   Patient alert and oriented and in no cardiopulmonary distress.  HEENT: No facial asymmetry, EOMI,     Neck supple .  Chest: Clear to auscultation bilaterally.  CVS: S1, S2 no murmurs, no S3.Regular rate.  ABD: Soft non tender.   Ext: No edema  MS: Adequate ROM spine, shoulders, hips and knees.  Skin: Intact, no ulcerations or rash noted.  Psych: Good eye contact, normal affect. Memory intact not anxious or depressed appearing.  CNS: CN 2-12 intact, power,  normal throughout.no focal deficits noted.   Assessment & Plan  Type 2 diabetes mellitus with vascular disease  ([place]) Controlled, no change in medication Robert [name] is reminded of the importance of commitment to daily physical activity for 30 minutes or more, as able and the need to limit carbohydrate intake to 30 to 60 grams per meal to help with blood sugar control.   The need to take medication as prescribed, test blood sugar as directed, and to call between visits if there is a concern that blood sugar is uncontrolled is also discussed.   Robert [name] is reminded of the importance of daily foot exam, annual eye examination, and good blood sugar, blood pressure and cholesterol control.  Diabetic Labs Latest Ref Rng & Units 08/29/2019 05/15/2019 03/05/2019 02/07/2019 01/16/2019  HbA1c <5.7 % of total Hgb 6.5(H) 7.1(H) - - 6.7  Microalbumin Not Estab. ug/mL - - 40.7(H) - -  Micro/Creat Ratio 0 - 29 mg/g creat - - 22 - -  Chol <200 mg/dL - 117 - - -  HDL > OR = 40 mg/dL - 38(L) - - -  Calc LDL mg/dL (calc) - 63 - - -  Triglycerides <150 mg/dL - 84 - - -  Creatinine 0.70 - 1.25 mg/dL 1.28(H) 1.25 - 1.25 -   BP/Weight 09/03/2019 06/03/2019 04/09/2019 02/12/2019 02/12/2019 01/01/2019 0/93/8182  Systolic BP 993 716 - 967 893 810 -  Diastolic BP 74 68 - 70 70 72 -  Wt. (Lbs) 220 219 218 208 208 213 214  BMI 32.49 32.34 32.19 30.72 30.72 31.45 31.6   Foot/eye exam completion dates Latest Ref Rng & Units 06/18/2019 11/05/2018  Eye Exam No Retinopathy No Retinopathy -  Foot exam Order - - -  Foot Form Completion - - Done        Essential hypertension Controlled, no change in medication DASH diet and commitment to daily physical activity for a minimum of 30 minutes discussed and encouraged, as a part of hypertension management. The importance of attaining a healthy weight is also discussed.  BP/Weight 09/03/2019 06/03/2019 04/09/2019 02/12/2019 02/12/2019 01/01/2019 04/18/5788  Systolic BP 383 338 - 329 191 660 -  Diastolic BP 74 68 - 70 70 72 -  Wt. (Lbs) 220 219 218 208 208 213 214  BMI 32.49 32.34 32.19 30.72  30.72 31.45 31.6       Obesity (BMI 30.0-34.9)  Patient re-educated about  the importance of commitment to a  minimum of 150 minutes of exercise per week as able.  The importance of healthy food choices with portion control discussed, as well as eating regularly and within a 12 hour window most days. The need to choose "clean , green" food 50 to 75% of the time is discussed, as well as to make water the primary drink and set a goal of 64 ounces water daily.    Weight /BMI 09/03/2019 06/03/2019 04/09/2019  WEIGHT 220 lb 219 lb 218 lb  HEIGHT 5\' 9"  5\' 9"  5\' 9"   BMI 32.49 kg/m2 32.34 kg/m2 32.19 kg/m2      Hyperlipidemia LDL goal <70 Hyperlipidemia:Low fat diet discussed and encouraged.   Lipid Panel  Lab Results  Component Value Date   CHOL 117 05/15/2019   HDL 38 (L) 05/15/2019   LDLCALC 63 05/15/2019   TRIG 84 05/15/2019   CHOLHDL 3.1 05/15/2019   Need to increase exercise and lower fat intake    ASCVD (arteriosclerotic cardiovascular disease) Asymptomatic, has f/u with Cardiology in the Fall

## 2019-09-05 NOTE — Assessment & Plan Note (Signed)
Asymptomatic, has f/u with Cardiology in the Fall

## 2019-09-05 NOTE — Assessment & Plan Note (Signed)
Controlled, no change in medication DASH diet and commitment to daily physical activity for a minimum of 30 minutes discussed and encouraged, as a part of hypertension management. The importance of attaining a healthy weight is also discussed.  BP/Weight 09/03/2019 06/03/2019 04/09/2019 02/12/2019 02/12/2019 01/01/2019 7/79/3903  Systolic BP 009 233 - 007 622 633 -  Diastolic BP 74 68 - 70 70 72 -  Wt. (Lbs) 220 219 218 208 208 213 214  BMI 32.49 32.34 32.19 30.72 30.72 31.45 31.6

## 2019-09-05 NOTE — Assessment & Plan Note (Signed)
  Patient re-educated about  the importance of commitment to a  minimum of 150 minutes of exercise per week as able.  The importance of healthy food choices with portion control discussed, as well as eating regularly and within a 12 hour window most days. The need to choose "clean , green" food 50 to 75% of the time is discussed, as well as to make water the primary drink and set a goal of 64 ounces water daily.    Weight /BMI 09/03/2019 06/03/2019 04/09/2019  WEIGHT 220 lb 219 lb 218 lb  HEIGHT 5\' 9"  5\' 9"  5\' 9"   BMI 32.49 kg/m2 32.34 kg/m2 32.19 kg/m2

## 2019-10-15 ENCOUNTER — Encounter: Payer: Self-pay | Admitting: Podiatry

## 2019-10-15 ENCOUNTER — Ambulatory Visit: Payer: Medicare Other | Admitting: Podiatry

## 2019-10-15 ENCOUNTER — Ambulatory Visit: Payer: Medicare Other | Admitting: Nutrition

## 2019-10-15 ENCOUNTER — Other Ambulatory Visit: Payer: Self-pay

## 2019-10-15 DIAGNOSIS — M79674 Pain in right toe(s): Secondary | ICD-10-CM | POA: Diagnosis not present

## 2019-10-15 DIAGNOSIS — L84 Corns and callosities: Secondary | ICD-10-CM | POA: Diagnosis not present

## 2019-10-15 DIAGNOSIS — B351 Tinea unguium: Secondary | ICD-10-CM | POA: Diagnosis not present

## 2019-10-15 DIAGNOSIS — M79675 Pain in left toe(s): Secondary | ICD-10-CM | POA: Diagnosis not present

## 2019-10-15 DIAGNOSIS — E1151 Type 2 diabetes mellitus with diabetic peripheral angiopathy without gangrene: Secondary | ICD-10-CM | POA: Diagnosis not present

## 2019-10-15 NOTE — Progress Notes (Signed)
Subjective: Robert [name] presents today preventative diabetic foot care and painful callus(es) left foot and painful thick toenails that are difficult to trim. Pain interferes with ambulation. Aggravating factors include wearing enclosed shoe gear. Pain is relieved with periodic professional debridement.  Patient states he will be playing 18 holes of golf this morning. He is happy to report his granddaughter has graduated from nursing school and passed her licensing exam. She will be working for [employer].  He voices no new pedal concerns on today's visit.  Robert Helper, MD is patient's PCP. Last visit was: 09/03/2019.  Past Medical History:  Diagnosis Date  . Arteriosclerotic cardiovascular disease (ASCVD)    DES to LAD D1 - 2003 in [place]; failed intervention for a totally obstructed RCA at that time; EF of 45%; 12/2009: Equivocal stress nuclear with good exercise tolerance, negative stress EKG, normal EF with mild mid and distal inferior ischemia  . Coronary artery disease   . Diabetes mellitus, insulin dependent (IDDM), controlled 10/25/2012   HBA1C is 6.9 on 10/22/2012   . Diabetes mellitus, type II ([place])   . Hyperlipidemia    Lipid profile in 09/2011:128, 130, 33, 69  . Hypertension    Lab  09/2011: Normal CMet ex G-133  . Tobacco abuse, in remission    30 pack years; quit in 1985     Patient Active Problem List   Diagnosis Date Noted  . Right hip pain 02/16/2019  . Knee pain, right 11/05/2018  . Type 2 diabetes mellitus with vascular disease ([place]) 03/05/2018  . Dysphagia 03/05/2018  . Hypersomnia with sleep apnea 01/16/2016  . Seasonal allergies 01/04/2015  . Tubular adenoma of colon 11/13/2014  . ASCVD (arteriosclerotic cardiovascular disease) 08/21/2014  . Bilateral hand pain 04/06/2014  . Back pain 09/16/2013  . Essential hypertension   . Hyperlipidemia LDL goal <70   . Obesity (BMI 30.0-34.9) 12/24/2011  . ED (erectile dysfunction) 02/14/2011  .  Cardiovascular disease 09/27/2009  . TOBACCO ABUSE, HX OF 09/27/2009    Current Outpatient Medications on File Prior to Visit  Medication Sig Dispense Refill  . ACCU-CHEK FASTCLIX LANCETS MISC Use up to three times daily Dx e11.22 300 each 1  . acetaminophen (TYLENOL) 500 MG tablet Take 500 mg by mouth as needed.    Marland Kitchen aspirin 81 MG tablet Take 81 mg by mouth daily.      Marland Kitchen atorvastatin (LIPITOR) 40 MG tablet Take 1 tablet (40 mg total) by mouth daily. 90 tablet 3  . Blood Glucose Monitoring Suppl (ACCU-CHEK NANO SMARTVIEW) w/Device KIT TEST 1 TO 2 TIMES DAILY 1 kit 0  . glipiZIDE (GLUCOTROL) 5 MG tablet Take 1 tablet (5 mg total) by mouth daily before breakfast. 90 tablet 3  . glucose blood (ACCU-CHEK SMARTVIEW) test strip USE AS INSTRUCTED 3 TIMES  DAILY 300 strip 3  . Insulin Pen Needle (B-D ULTRAFINE III SHORT PEN) 31G X 8 MM MISC USE AS DIRECTED WITH  LEVEMIR FLEXPEN DAILY 100 each 5  . Insulin Syringe-Needle U-100 (INSULIN SYRINGE .5CC/30GX5/16") 30G X 5/16" 0.5 ML MISC Use as directed with novolog 70/30 100 each 3  . LANTUS SOLOSTAR 100 UNIT/ML Solostar Pen INJECT SUBCUTANEOUSLY 70  UNITS DAILY 75 mL 3  . metoprolol succinate (TOPROL-XL) 50 MG 24 hr tablet TAKE 1 TABLET BY MOUTH  DAILY WITH OR IMMEDIATELY  AFTER A MEAL 90 tablet 3  . nitroGLYCERIN (NITROSTAT) 0.4 MG SL tablet Place 1 tablet (0.4 mg total) under the tongue every 5 (  five) minutes as needed for chest pain. 25 tablet 3  . nystatin (MYCOSTATIN/NYSTOP) powder Apply between toes and under toes every morning 45 g 2  . olmesartan (BENICAR) 20 MG tablet TAKE 1 TABLET BY MOUTH  DAILY (DOSE REDUCTION) 90 tablet 3  . sildenafil (VIAGRA) 100 MG tablet Take 1 tablet (100 mg total) by mouth daily as needed for erectile dysfunction. (Patient not taking: Reported on 06/03/2019) 10 tablet 3  . triamcinolone ointment (KENALOG) 0.1 % Apply 1 application topically 2 (two) times daily. 454 g 0   No current facility-administered medications on  file prior to visit.     Allergies  Allergen Reactions  . Rosuvastatin Other (See Comments)    Adverse GI symptoms    Objective: Robert [name] is a pleasant 69 y.o. African American male WD, WN in NAD. AAO x 3.  There were no vitals filed for this visit.  Vascular Examination: Neurovascular status unchanged b/l lower extremities. Faintly palpable DP pulse(s) b/l lower extremities. Nonpalpable PT pulse(s) b/l lower extremities. Pedal hair absent. Lower extremity skin temperature gradient within normal limits. No pain with calf compression b/l. No edema noted b/l lower extremities. No ischemia or gangrene noted b/l lower extremities.  Dermatological Examination: Pedal skin with normal turgor, texture and tone bilaterally. No open wounds bilaterally. No interdigital macerations bilaterally. Toenails 1-5 b/l elongated, discolored, dystrophic, thickened, crumbly with subungual debris and tenderness to dorsal palpation. Hyperkeratotic lesion(s) submet head 5 left foot.  No erythema, no edema, no drainage, no flocculence.  Musculoskeletal: Normal muscle strength 5/5 to all lower extremity muscle groups bilaterally. No pain crepitus or joint limitation noted with ROM b/l. No gross bony deformities bilaterally. Patient ambulates independent of any assistive aids.  Neurological Examination: Protective sensation intact 5/5 intact bilaterally with 10g monofilament b/l. Vibratory sensation intact b/l. Proprioception intact bilaterally. Clonus negative b/l.  Last A1c: Hemoglobin A1C Latest Ref Rng & Units 08/29/2019 05/15/2019 01/16/2019 11/04/2018  HGBA1C <5.7 % of total Hgb 6.5(H) 7.1(H) 6.7 9.3(H)  Some recent data might be hidden   Assessment: 1. Pain due to onychomycosis of toenails of both feet   2. Callus   3. Diabetes mellitus type 2 with peripheral artery disease ([place])    Plan: -Examined patient. -Continue diabetic foot care principles. -Toenails 1-5 b/l were debrided in length and  girth with sterile nail nippers and dremel without iatrogenic bleeding.  -Callus(es) submet head 5 left foot pared utilizing sterile scalpel blade without complication or incident. Total number debrided =1. -Patient to report any pedal injuries to medical professional immediately. -Patient to continue soft, supportive shoe gear daily. -Patient/POA to call should there be question/concern in the interim.  Return in about 9 weeks (around 12/17/2019) for diabetic nail and callus trim.  Marzetta Board, DPM

## 2019-11-25 ENCOUNTER — Telehealth (INDEPENDENT_AMBULATORY_CARE_PROVIDER_SITE_OTHER): Payer: Medicare Other | Admitting: Family Medicine

## 2019-11-25 ENCOUNTER — Encounter: Payer: Self-pay | Admitting: Family Medicine

## 2019-11-25 ENCOUNTER — Other Ambulatory Visit: Payer: Self-pay

## 2019-11-25 VITALS — Ht 69.0 in | Wt 217.0 lb

## 2019-11-25 DIAGNOSIS — Z0000 Encounter for general adult medical examination without abnormal findings: Secondary | ICD-10-CM | POA: Diagnosis not present

## 2019-11-25 NOTE — Patient Instructions (Signed)
Robert [name] , Thank you for taking time to come for your Medicare Wellness Visit. I appreciate your ongoing commitment to your health goals. Please review the following plan we discussed and let me know if I can assist you in the future.   Please continue to practice social distancing to keep you, your family, and our community safe.  If you must go out, please wear a Mask and practice good handwashing.  Screening recommendations/referrals: Colonoscopy: Due 2026 Recommended yearly ophthalmology/optometry visit for glaucoma screening and checkup Recommended yearly dental visit for hygiene and checkup  Vaccinations: Influenza vaccine: Due  Pneumococcal vaccine: up to date  Tdap vaccine: up to date  Shingles vaccine: Due   Advanced directives: unsure, please mail paperwork  Conditions/risks identified: Falls, Diabetes  Next appointment: 01/21/2020  Preventive Care 68 Years and Older, Male Preventive care refers to lifestyle choices and visits with your health care provider that can promote health and wellness. What does preventive care include?  A yearly physical exam. This is also called an annual well check.  Dental exams once or twice a year.  Routine eye exams. Ask your health care provider how often you should have your eyes checked.  Personal lifestyle choices, including:  Daily care of your teeth and gums.  Regular physical activity.  Eating a healthy diet.  Avoiding tobacco and drug use.  Limiting alcohol use.  Practicing safe sex.  Taking low doses of aspirin every day.  Taking vitamin and mineral supplements as recommended by your health care provider. What happens during an annual well check? The services and screenings done by your health care provider during your annual well check will depend on your age, overall health, lifestyle risk factors, and family history of disease. Counseling  Your health care provider may ask you questions about  your:  Alcohol use.  Tobacco use.  Drug use.  Emotional well-being.  Home and relationship well-being.  Sexual activity.  Eating habits.  History of falls.  Memory and ability to understand (cognition).  Work and work Statistician. Screening  You may have the following tests or measurements:  Height, weight, and BMI.  Blood pressure.  Lipid and cholesterol levels. These may be checked every 5 years, or more frequently if you are over 34 years old.  Skin check.  Lung cancer screening. You may have this screening every year starting at age 56 if you have a 30-pack-year history of smoking and currently smoke or have quit within the past 15 years.  Fecal occult blood test (FOBT) of the stool. You may have this test every year starting at age 75.  Flexible sigmoidoscopy or colonoscopy. You may have a sigmoidoscopy every 5 years or a colonoscopy every 10 years starting at age 47.  Prostate cancer screening. Recommendations will vary depending on your family history and other risks.  Hepatitis C blood test.  Hepatitis B blood test.  Sexually transmitted disease (STD) testing.  Diabetes screening. This is done by checking your blood sugar (glucose) after you have not eaten for a while (fasting). You may have this done every 1-3 years.  Abdominal aortic aneurysm (AAA) screening. You may need this if you are a current or former smoker.  Osteoporosis. You may be screened starting at age 108 if you are at high risk. Talk with your health care provider about your test results, treatment options, and if necessary, the need for more tests. Vaccines  Your health care provider may recommend certain vaccines, such as:  Influenza vaccine.  This is recommended every year.  Tetanus, diphtheria, and acellular pertussis (Tdap, Td) vaccine. You may need a Td booster every 10 years.  Zoster vaccine. You may need this after age 71.  Pneumococcal 13-valent conjugate (PCV13) vaccine.  One dose is recommended after age 3.  Pneumococcal polysaccharide (PPSV23) vaccine. One dose is recommended after age 31. Talk to your health care provider about which screenings and vaccines you need and how often you need them. This information is not intended to replace advice given to you by your health care provider. Make sure you discuss any questions you have with your health care provider. Document Released: 04/16/2015 Document Revised: 12/08/2015 Document Reviewed: 01/19/2015 Elsevier Interactive Patient Education  2017 [place] Prevention in the Home Falls can cause injuries. They can happen to people of all ages. There are many things you can do to make your home safe and to help prevent falls. What can I do on the outside of my home?  Regularly fix the edges of walkways and driveways and fix any cracks.  Remove anything that might make you trip as you walk through a door, such as a raised step or threshold.  Trim any bushes or trees on the path to your home.  Use bright outdoor lighting.  Clear any walking paths of anything that might make someone trip, such as rocks or tools.  Regularly check to see if handrails are loose or broken. Make sure that both sides of any steps have handrails.  Any raised decks and porches should have guardrails on the edges.  Have any leaves, snow, or ice cleared regularly.  Use sand or salt on walking paths during winter.  Clean up any spills in your garage right away. This includes oil or grease spills. What can I do in the bathroom?  Use night lights.  Install grab bars by the toilet and in the tub and shower. Do not use towel bars as grab bars.  Use non-skid mats or decals in the tub or shower.  If you need to sit down in the shower, use a plastic, non-slip stool.  Keep the floor dry. Clean up any water that spills on the floor as soon as it happens.  Remove soap buildup in the tub or shower regularly.  Attach bath  mats securely with double-sided non-slip rug tape.  Do not have throw rugs and other things on the floor that can make you trip. What can I do in the bedroom?  Use night lights.  Make sure that you have a light by your bed that is easy to reach.  Do not use any sheets or blankets that are too big for your bed. They should not hang down onto the floor.  Have a firm chair that has side arms. You can use this for support while you get dressed.  Do not have throw rugs and other things on the floor that can make you trip. What can I do in the kitchen?  Clean up any spills right away.  Avoid walking on wet floors.  Keep items that you use a lot in easy-to-reach places.  If you need to reach something above you, use a strong step stool that has a grab bar.  Keep electrical cords out of the way.  Do not use floor polish or wax that makes floors slippery. If you must use wax, use non-skid floor wax.  Do not have throw rugs and other things on the floor that can make  you trip. What can I do with my stairs?  Do not leave any items on the stairs.  Make sure that there are handrails on both sides of the stairs and use them. Fix handrails that are broken or loose. Make sure that handrails are as long as the stairways.  Check any carpeting to make sure that it is firmly attached to the stairs. Fix any carpet that is loose or worn.  Avoid having throw rugs at the top or bottom of the stairs. If you do have throw rugs, attach them to the floor with carpet tape.  Make sure that you have a light switch at the top of the stairs and the bottom of the stairs. If you do not have them, ask someone to add them for you. What else can I do to help prevent falls?  Wear shoes that:  Do not have high heels.  Have rubber bottoms.  Are comfortable and fit you well.  Are closed at the toe. Do not wear sandals.  If you use a stepladder:  Make sure that it is fully opened. Do not climb a closed  stepladder.  Make sure that both sides of the stepladder are locked into place.  Ask someone to hold it for you, if possible.  Clearly mark and make sure that you can see:  Any grab bars or handrails.  First and last steps.  Where the edge of each step is.  Use tools that help you move around (mobility aids) if they are needed. These include:  Canes.  Walkers.  Scooters.  Crutches.  Turn on the lights when you go into a dark area. Replace any light bulbs as soon as they burn out.  Set up your furniture so you have a clear path. Avoid moving your furniture around.  If any of your floors are uneven, fix them.  If there are any pets around you, be aware of where they are.  Review your medicines with your doctor. Some medicines can make you feel dizzy. This can increase your chance of falling. Ask your doctor what other things that you can do to help prevent falls. This information is not intended to replace advice given to you by your health care provider. Make sure you discuss any questions you have with your health care provider. Document Released: 01/14/2009 Document Revised: 08/26/2015 Document Reviewed: 04/24/2014 Elsevier Interactive Patient Education  2017 [employer].

## 2019-11-25 NOTE — Progress Notes (Signed)
Subjective:   Robert [name] is a 69 y.o. male who presents for Medicare Annual/Subsequent preventive examination.  Method of visit: Telephone  Location of Patient: Home Location of Provider: Office Consent was obtain for visit to be over via a telephone Services rendered by provider: Visit was performed via telephone/audio only  I verified that I am speaking with the correct person using two identifiers.  Review of Systems     Cardiac Risk Factors include: advanced age (>25mn, >>30women);diabetes mellitus;dyslipidemia;hypertension;obesity (BMI >30kg/m2);male gender     Objective:    Today's Vitals   11/25/19 1432  Weight: 217 lb (98.4 kg)  Height: _0  (1.753 m)  PainSc: 0-No pain   Body mass index is 32.05 kg/m.  Advanced Directives 11/12/2017 02/01/2016 11/11/2014  Does Patient Have a Medical Advance Directive? Yes No No  Does patient want to make changes to medical advance directive? Yes (ED - Information included in AVS) - -  Would patient like information on creating a medical advance directive? - - Yes - Educational materials given    Current Medications (verified) Outpatient Encounter Medications as of 11/25/2019  Medication Sig  . ACCU-CHEK FASTCLIX LANCETS MISC Use up to three times daily Dx e11.22  . acetaminophen (TYLENOL) 500 MG tablet Take 500 mg by mouth as needed.  .Marland Kitchenaspirin 81 MG tablet Take 81 mg by mouth daily.    .Marland Kitchenatorvastatin (LIPITOR) 40 MG tablet Take 1 tablet (40 mg total) by mouth daily.  . Blood Glucose Monitoring Suppl (ACCU-CHEK NANO SMARTVIEW) w/Device KIT TEST 1 TO 2 TIMES DAILY  . glipiZIDE (GLUCOTROL) 5 MG tablet Take 1 tablet (5 mg total) by mouth daily before breakfast.  . glucose blood (ACCU-CHEK SMARTVIEW) test strip USE AS INSTRUCTED 3 TIMES  DAILY  . Insulin Pen Needle (B-D ULTRAFINE III SHORT PEN) 31G X 8 MM MISC USE AS DIRECTED WITH  LEVEMIR FLEXPEN DAILY  . Insulin Syringe-Needle U-100 (INSULIN SYRINGE .5CC/30GX5/16") 30G X  5/16" 0.5 ML MISC Use as directed with novolog 70/30  . LANTUS SOLOSTAR 100 UNIT/ML Solostar Pen INJECT SUBCUTANEOUSLY 70  UNITS DAILY  . metoprolol succinate (TOPROL-XL) 50 MG 24 hr tablet TAKE 1 TABLET BY MOUTH  DAILY WITH OR IMMEDIATELY  AFTER A MEAL  . nitroGLYCERIN (NITROSTAT) 0.4 MG SL tablet Place 1 tablet (0.4 mg total) under the tongue every 5 (five) minutes as needed for chest pain.  .Marland Kitchennystatin (MYCOSTATIN/NYSTOP) powder Apply between toes and under toes every morning  . olmesartan (BENICAR) 20 MG tablet TAKE 1 TABLET BY MOUTH  DAILY (DOSE REDUCTION)  . sildenafil (VIAGRA) 100 MG tablet Take 1 tablet (100 mg total) by mouth daily as needed for erectile dysfunction.  . triamcinolone ointment (KENALOG) 0.1 % Apply 1 application topically 2 (two) times daily.   No facility-administered encounter medications on file as of 11/25/2019.    Allergies (verified) Rosuvastatin   History: Past Medical History:  Diagnosis Date  . Arteriosclerotic cardiovascular disease (ASCVD)    [place] LAD D1 - [place] for a totally obstructed RCA at that time; EF of 45%; 12/2009: Equivocal stress nuclear with good exercise tolerance, negative stress EKG, normal EF with mild mid and distal inferior ischemia  . Coronary artery disease   . Diabetes mellitus, insulin dependent (IDDM), controlled 10/25/2012   HBA1C is 6.9 on 10/22/2012   . [place]   . Hyperlipidemia    Lipid profile in 09/2011:128, 130, 33, 69  .  Hypertension    Lab  09/2011: Normal CMet ex G-133  . Tobacco abuse, in remission    30 pack years; quit in 1985   Past Surgical History:  Procedure Laterality Date  . CARDIAC CATHETERIZATION    . COLONOSCOPY  2010   Negative screening study  . COLONOSCOPY N/A 11/11/2014   Procedure: COLONOSCOPY;  Surgeon: Rogene Houston, MD;  Location: [facility];  Service: Endoscopy;  Laterality: N/A;  1030  . EPIDIDYMIS SURGERY  1996  . KNEE ARTHROSCOPY  W/ MENISCECTOMY  11/2008   Right  . PRESSURE ULCER DEBRIDEMENT  2006   Right lower extremity  . ROTATOR CUFF REPAIR  07/2009   Right   Family History  Problem Relation Age of Onset  . Diabetes Sister   . Arthritis Brother   . Diabetes Brother   . Arthritis Mother   . Cancer Father        Hypernephroma   Social History   Socioeconomic History  . Marital status: Married    Spouse name: Not on file  . Number of children: 2  . Years of education: Not on file  . Highest education level: Some college, no degree  Occupational History  . Occupation: Retired  Tobacco Use  . Smoking status: Former Smoker    Packs/day: 1.00    Years: 30.00    Pack years: 30.00    Types: Cigarettes    Start date: 04/03/1966    Quit date: 09/02/1983    Years since quitting: 36.2  . Smokeless tobacco: Never Used  Vaping Use  . Vaping Use: Never used  Substance and Sexual Activity  . Alcohol use: No    Alcohol/week: 0.0 standard drinks    Comment: Former Tax inspector -discontinued use in 2005  . Drug use: No  . Sexual activity: Yes  Other Topics Concern  . Not on file  Social History Narrative  . Not on file   Social Determinants of Health   Financial Resource Strain: Low Risk   . Difficulty of Paying Living Expenses: Not hard at all  Food Insecurity: No Food Insecurity  . Worried About Charity fundraiser in the Last Year: Never true  . Ran Out of Food in the Last Year: Never true  Transportation Needs: No Transportation Needs  . Lack of Transportation (Medical): No  . Lack of Transportation (Non-Medical): No  Physical Activity: Inactive  . Days of Exercise per Week: 0 days  . Minutes of Exercise per Session: 0 min  Stress: No Stress Concern Present  . Feeling of Stress : Not at all  Social Connections: Socially Integrated  . Frequency of Communication with Friends and Family: More than three times a week  . Frequency of Social Gatherings with Friends and Family: Twice a week  . Attends  Religious Services: More than 4 times per year  . Active Member of Clubs or Organizations: Yes  . Attends Archivist Meetings: More than 4 times per year  . Marital Status: Married    Tobacco Counseling Counseling given: Yes   Clinical Intake:  Pre-visit preparation completed: Yes  Pain : No/denies pain Pain Score: 0-No pain     BMI - recorded: 32.05 Nutritional Status: BMI > 30  Obese Nutritional Risks: None Diabetes: Yes CBG done?: No Did pt. bring in CBG monitor from home?: No  How often do you need to have someone help you when you read instructions, pamphlets, or other written materials from your doctor or pharmacy?:  1 - Never What is the last grade level you completed in school?: 12+ community college  Diabetic? yes  Interpreter Needed?: No      Activities of Daily Living In your present state of health, do you have any difficulty performing the following activities: 11/25/2019  Hearing? N  Vision? N  Difficulty concentrating or making decisions? N  Walking or climbing stairs? N  Dressing or bathing? N  Doing errands, shopping? N  Preparing Food and eating ? N  Using the Toilet? N  In the past six months, have you accidently leaked urine? N  Do you have problems with loss of bowel control? N  Managing your Medications? N  Managing your Finances? N  Some recent data might be hidden    Patient Care Team: Fayrene Helper, MD as PCP - General Branch, Alphonse Guild, MD as PCP - Cardiology (Cardiology)  Dr Adah Perl - Podiatry    Indicate any recent Medical Services you may have received from other than [facility] providers in the past year (date may be approximate).     Assessment:   This is a routine wellness examination for [place].  Hearing/Vision screen No exam data present  Dietary issues and exercise activities discussed: Current Exercise Habits: The patient does not participate in regular exercise at present, Exercise limited by: Other -  see comments  Goals   None    Depression Screen PHQ 2/9 Scores 11/25/2019 11/25/2019 11/27/2018 11/20/2018 11/05/2018 07/09/2018 03/05/2018  PHQ - 2 Score 0 0 0 0 0 0 0  PHQ- 9 Score - - - - - - 3    Fall Risk Fall Risk  11/25/2019 09/03/2019 02/12/2019 01/01/2019 11/27/2018  Falls in the past year? 0 0 0 0 0  Number falls in past yr: - 0 0 0 0  Injury with Fall? - 0 0 0 0  Risk for fall due to : - - - - -  Follow up Falls evaluation completed;Education provided;Falls prevention discussed - - - -    Any stairs in or around the home? Yes  If so, are there any without handrails? Yes  Home free of loose throw rugs in walkways, pet beds, electrical cords, etc? Yes  Adequate lighting in your home to reduce risk of falls? Yes   ASSISTIVE DEVICES UTILIZED TO PREVENT FALLS:  Life alert? No  Use of a cane, walker or w/c? No  Grab bars in the bathroom? Yes  Shower chair or bench in shower? No  Elevated toilet seat or a handicapped toilet? Yes   TIMED UP AND GO:  Was the test performed? No .   Cognitive Function:     6CIT Screen 11/25/2019 11/20/2018 11/12/2017  What Year? 0 points 0 points 0 points  What month? 0 points 0 points 0 points  What time? 0 points 0 points 0 points  Count back from 20 0 points 0 points -  Months in reverse 0 points 0 points -  Repeat phrase 0 points 0 points -  Total Score 0 0 -    Immunizations Immunization History  Administered Date(s) Administered  . Fluad Quad(high Dose 65+) 01/01/2019  . Influenza Split 02/14/2011, 12/18/2011  . Influenza Whole 12/25/2006, 12/23/2008, 01/13/2010  . Influenza,inj,Quad PF,6+ Mos 01/10/2013, 12/02/2013, 01/26/2015, 12/09/2015, 11/30/2016, 12/31/2017  . PFIZER SARS-COV-2 Vaccination 05/01/2019, 05/23/2019  . Pneumococcal Conjugate-13 11/10/2013  . Pneumococcal Polysaccharide-23 03/10/2004, 01/13/2010, 11/30/2016  . Tdap 12/18/2011  . Zoster 12/18/2011    TDAP status: Up to date Flu Vaccine  status: Up to  date Pneumococcal vaccine status: Up to date Covid-19 vaccine status: Completed vaccines  Qualifies for Shingles Vaccine? Yes   Zostavax completed Yes   Shingrix Completed?: No.    Education has been provided regarding the importance of this vaccine. Patient has been advised to call insurance company to determine out of pocket expense if they have not yet received this vaccine. Advised may also receive vaccine at local pharmacy or Health Dept. Verbalized acceptance and understanding.  Screening Tests Health Maintenance  Topic Date Due  . INFLUENZA VACCINE  11/02/2019  . FOOT EXAM  11/05/2019  . HEMOGLOBIN A1C  02/29/2020  . OPHTHALMOLOGY EXAM  06/17/2020  . TETANUS/TDAP  12/17/2021  . COLONOSCOPY  11/10/2024  . COVID-19 Vaccine  Completed  . Hepatitis C Screening  Completed  . PNA vac Low Risk Adult  Completed    Health Maintenance  Health Maintenance Due  Topic Date Due  . INFLUENZA VACCINE  11/02/2019  . FOOT EXAM  11/05/2019    Colorectal cancer screening: Completed 11/11/2014. Repeat every 10 years  Lung Cancer Screening: (Low Dose CT Chest recommended if Age 28-80 years, 30 pack-year currently smoking OR have quit w/in 15years.) does not qualify.   Lung Cancer Screening Referral: referral placed  Additional Screening:  Hepatitis C Screening: does not qualify; Completed 12/31/2014  Vision Screening: Recommended annual ophthalmology exams for early detection of glaucoma and other disorders of the eye. Is the patient up to date with their annual eye exam?  Yes  Who is the provider or what is the name of the office in which the patient attends annual eye exams? [facility] If pt is not established with a provider, would they like to be referred to a provider to establish care? No .   Dental Screening: Recommended annual dental exams for proper oral hygiene  Community Resource Referral / Chronic Care Management: CRR required this visit?  No   CCM required  this visit?  No      Plan:      1. Encounter for Medicare annual wellness exam   I have personally reviewed and noted the following in the patient's chart:   . Medical and social history . Use of alcohol, tobacco or illicit drugs  . Current medications and supplements . Functional ability and status . Nutritional status . Physical activity . Advanced directives . List of other physicians . Hospitalizations, surgeries, and ER visits in previous 12 months . Vitals . Screenings to include cognitive, depression, and falls . Referrals and appointments  In addition, I have reviewed and discussed with patient certain preventive protocols, quality metrics, and best practice recommendations. A written personalized care plan for preventive services as well as general preventive health recommendations were provided to patient.     Perlie Mayo, NP   11/25/2019   I provided 20 minutes of non-face-to-face time during this encounter.

## 2020-01-08 ENCOUNTER — Ambulatory Visit: Payer: Medicare Other | Attending: Internal Medicine

## 2020-01-08 DIAGNOSIS — Z23 Encounter for immunization: Secondary | ICD-10-CM

## 2020-01-08 NOTE — Progress Notes (Signed)
   Covid-19 Vaccination Clinic  Name:  Robert [name]    MRN: [id number] DOB: 04-25-1950  01/08/2020  Robert [name] was observed post Covid-19 immunization for 15 minutes without incident. He was provided with Vaccine Information Sheet and instruction to access the V-Safe system.   Robert [name] was instructed to call 911 with any severe reactions post vaccine: Marland Kitchen Difficulty breathing  . Swelling of face and throat  . A fast heartbeat  . A bad rash all over body  . Dizziness and weakness

## 2020-01-14 ENCOUNTER — Other Ambulatory Visit: Payer: Self-pay

## 2020-01-14 ENCOUNTER — Encounter: Payer: Self-pay | Admitting: Podiatry

## 2020-01-14 ENCOUNTER — Ambulatory Visit: Payer: Medicare Other | Admitting: Podiatry

## 2020-01-14 DIAGNOSIS — L309 Dermatitis, unspecified: Secondary | ICD-10-CM | POA: Diagnosis not present

## 2020-01-14 DIAGNOSIS — M79675 Pain in left toe(s): Secondary | ICD-10-CM

## 2020-01-14 DIAGNOSIS — M79674 Pain in right toe(s): Secondary | ICD-10-CM

## 2020-01-14 DIAGNOSIS — B351 Tinea unguium: Secondary | ICD-10-CM | POA: Diagnosis not present

## 2020-01-14 DIAGNOSIS — L84 Corns and callosities: Secondary | ICD-10-CM

## 2020-01-14 DIAGNOSIS — E1151 Type 2 diabetes mellitus with diabetic peripheral angiopathy without gangrene: Secondary | ICD-10-CM

## 2020-01-14 MED ORDER — TRIAMCINOLONE ACETONIDE 0.1 % EX OINT: 1.0000 "application " | TOPICAL_OINTMENT | Freq: Two times a day (BID) | CUTANEOUS | 1 refills | Status: DC

## 2020-01-16 DIAGNOSIS — E1159 Type 2 diabetes mellitus with other circulatory complications: Secondary | ICD-10-CM | POA: Diagnosis not present

## 2020-01-16 DIAGNOSIS — E785 Hyperlipidemia, unspecified: Secondary | ICD-10-CM | POA: Diagnosis not present

## 2020-01-16 DIAGNOSIS — E1122 Type 2 diabetes mellitus with diabetic chronic kidney disease: Secondary | ICD-10-CM | POA: Diagnosis not present

## 2020-01-16 DIAGNOSIS — I129 Hypertensive chronic kidney disease with stage 1 through stage 4 chronic kidney disease, or unspecified chronic kidney disease: Secondary | ICD-10-CM | POA: Diagnosis not present

## 2020-01-17 LAB — COMPLETE METABOLIC PANEL WITH GFR
AG Ratio: 1.4 (calc) (ref 1.0–2.5)
ALT: 22 U/L (ref 9–46)
AST: 19 U/L (ref 10–35)
Albumin: 3.9 g/dL (ref 3.6–5.1)
Alkaline phosphatase (APISO): 116 U/L (ref 35–144)
BUN/Creatinine Ratio: 10 (calc) (ref 6–22)
BUN: 13 mg/dL (ref 7–25)
CO2: 25 mmol/L (ref 20–32)
Calcium: 9.1 mg/dL (ref 8.6–10.3)
Chloride: 106 mmol/L (ref 98–110)
Creat: 1.29 mg/dL — ABNORMAL HIGH (ref 0.70–1.25)
GFR, Est African American: 66 mL/min/{1.73_m2} (ref >=60)
GFR, Est Non African American: 57 mL/min/{1.73_m2} — ABNORMAL LOW (ref >=60)
Globulin: 2.7 g/dL (calc) (ref 1.9–3.7)
Glucose, Bld: 129 mg/dL — ABNORMAL HIGH (ref 65–99)
Potassium: 4.2 mmol/L (ref 3.5–5.3)
Sodium: 142 mmol/L (ref 135–146)
Total Bilirubin: 0.4 mg/dL (ref 0.2–1.2)
Total Protein: 6.6 g/dL (ref 6.1–8.1)

## 2020-01-17 LAB — CBC
HCT: 39.7 % (ref 38.5–50.0)
Hemoglobin: 13.2 g/dL (ref 13.2–17.1)
MCH: 29.5 pg (ref 27.0–33.0)
MCHC: 33.2 g/dL (ref 32.0–36.0)
MCV: 88.6 fL (ref 80.0–100.0)
MPV: 10.7 fL (ref 7.5–12.5)
Platelets: 228 10*3/uL (ref 140–400)
RBC: 4.48 10*6/uL (ref 4.20–5.80)
RDW: 13.7 % (ref 11.0–15.0)
WBC: 5.1 10*3/uL (ref 3.8–10.8)

## 2020-01-17 LAB — LIPID PANEL
Cholesterol: 131 mg/dL (ref <200)
HDL: 41 mg/dL (ref >=40)
LDL Cholesterol (Calc): 70 mg/dL (calc)
Non-HDL Cholesterol (Calc): 90 mg/dL (calc) (ref <130)
Total CHOL/HDL Ratio: 3.2 (calc) (ref <5.0)
Triglycerides: 123 mg/dL (ref <150)

## 2020-01-17 LAB — HEMOGLOBIN A1C
Hgb A1c MFr Bld: 7.9 % of total Hgb — ABNORMAL HIGH (ref <5.7)
Mean Plasma Glucose: 180 (calc)
eAG (mmol/L): 10 (calc)

## 2020-01-17 LAB — PSA: PSA: 2.21 ng/mL (ref <=4.0)

## 2020-01-18 NOTE — Progress Notes (Signed)
Subjective: Robert [name] presents today preventative diabetic foot care and painful callus(es) left foot and painful thick toenails that are difficult to trim. Pain interferes with ambulation. Aggravating factors include wearing enclosed shoe gear. Pain is relieved with periodic professional debridement.  Patient states his blood glucose was 135 mg/dl today.   He is requesting refill of ointment for his eczema today.  Robert Helper, MD is patient's PCP. Last visit was: 09/03/2019.  Past Medical History:  Diagnosis Date  . Arteriosclerotic cardiovascular disease (ASCVD)    DES to LAD D1 - 2003 in [place]; failed intervention for a totally obstructed RCA at that time; EF of 45%; 12/2009: Equivocal stress nuclear with good exercise tolerance, negative stress EKG, normal EF with mild mid and distal inferior ischemia  . Coronary artery disease   . Diabetes mellitus, insulin dependent (IDDM), controlled 10/25/2012   HBA1C is 6.9 on 10/22/2012   . Diabetes mellitus, type II ([place])   . Hyperlipidemia    Lipid profile in 09/2011:128, 130, 33, 69  . Hypertension    Lab  09/2011: Normal CMet ex G-133  . Tobacco abuse, in remission    30 pack years; quit in 1985     Patient Active Problem List   Diagnosis Date Noted  . Right hip pain 02/16/2019  . Knee pain, right 11/05/2018  . Type 2 diabetes mellitus with vascular disease ([place]) 03/05/2018  . Dysphagia 03/05/2018  . Hypersomnia with sleep apnea 01/16/2016  . Seasonal allergies 01/04/2015  . Tubular adenoma of colon 11/13/2014  . ASCVD (arteriosclerotic cardiovascular disease) 08/21/2014  . Bilateral hand pain 04/06/2014  . Back pain 09/16/2013  . Essential hypertension   . Hyperlipidemia LDL goal <70   . Obesity (BMI 30.0-34.9) 12/24/2011  . ED (erectile dysfunction) 02/14/2011  . Cardiovascular disease 09/27/2009  . TOBACCO ABUSE, HX OF 09/27/2009    Current Outpatient Medications on File Prior to Visit  Medication Sig Dispense  Refill  . ACCU-CHEK FASTCLIX LANCETS MISC Use up to three times daily Dx e11.22 300 each 1  . acetaminophen (TYLENOL) 500 MG tablet Take 500 mg by mouth as needed.    Marland Kitchen aspirin 81 MG tablet Take 81 mg by mouth daily.      Marland Kitchen atorvastatin (LIPITOR) 40 MG tablet Take 1 tablet (40 mg total) by mouth daily. 90 tablet 3  . Blood Glucose Monitoring Suppl (ACCU-CHEK NANO SMARTVIEW) w/Device KIT TEST 1 TO 2 TIMES DAILY 1 kit 0  . glipiZIDE (GLUCOTROL) 5 MG tablet Take 1 tablet (5 mg total) by mouth daily before breakfast. 90 tablet 3  . glucose blood (ACCU-CHEK SMARTVIEW) test strip USE AS INSTRUCTED 3 TIMES  DAILY 300 strip 3  . Insulin Pen Needle (B-D ULTRAFINE III SHORT PEN) 31G X 8 MM MISC USE AS DIRECTED WITH  LEVEMIR FLEXPEN DAILY 100 each 5  . Insulin Syringe-Needle U-100 (INSULIN SYRINGE .5CC/30GX5/16") 30G X 5/16" 0.5 ML MISC Use as directed with novolog 70/30 100 each 3  . LANTUS SOLOSTAR 100 UNIT/ML Solostar Pen INJECT SUBCUTANEOUSLY 70  UNITS DAILY 75 mL 3  . metoprolol succinate (TOPROL-XL) 50 MG 24 hr tablet TAKE 1 TABLET BY MOUTH  DAILY WITH OR IMMEDIATELY  AFTER A MEAL 90 tablet 3  . nitroGLYCERIN (NITROSTAT) 0.4 MG SL tablet Place 1 tablet (0.4 mg total) under the tongue every 5 (five) minutes as needed for chest pain. 25 tablet 3  . nystatin (MYCOSTATIN/NYSTOP) powder Apply between toes and under toes every morning 45 g 2  .  olmesartan (BENICAR) 20 MG tablet TAKE 1 TABLET BY MOUTH  DAILY (DOSE REDUCTION) 90 tablet 3  . sildenafil (VIAGRA) 100 MG tablet Take 1 tablet (100 mg total) by mouth daily as needed for erectile dysfunction. 10 tablet 3   No current facility-administered medications on file prior to visit.     Allergies  Allergen Reactions  . Rosuvastatin Other (See Comments)    Adverse GI symptoms    Objective: Robert [name] is a pleasant 69 y.o. African American male WD, WN in NAD. AAO x 3.  There were no vitals filed for this visit.  Vascular  Examination: Neurovascular status unchanged b/l lower extremities. Faintly palpable DP pulse(s) b/l lower extremities. Nonpalpable PT pulse(s) b/l lower extremities. Pedal hair absent. Lower extremity skin temperature gradient within normal limits. No pain with calf compression b/l. No edema noted b/l lower extremities. No ischemia or gangrene noted b/l lower extremities.  Dermatological Examination: Pedal skin with normal turgor, texture and tone bilaterally. No open wounds bilaterally. No interdigital macerations bilaterally. Toenails 1-5 b/l elongated, discolored, dystrophic, thickened, crumbly with subungual debris and tenderness to dorsal palpation. Hyperkeratotic lesion(s) submet head 5 left foot.  No erythema, no edema, no drainage, no flocculence.   Eczematous patch dorsal aspect of right foot.  Musculoskeletal: Normal muscle strength 5/5 to all lower extremity muscle groups bilaterally. No pain crepitus or joint limitation noted with ROM b/l. No gross bony deformities bilaterally. Patient ambulates independent of any assistive aids.  Neurological Examination: Protective sensation intact 5/5 intact bilaterally with 10g monofilament b/l. Vibratory sensation intact b/l. Proprioception intact bilaterally. Clonus negative b/l.  Last A1c: Hemoglobin A1C Latest Ref Rng & Units 01/16/2020 08/29/2019 05/15/2019  HGBA1C <5.7 % of total Hgb 7.9(H) 6.5(H) 7.1(H)  Some recent data might be hidden   Assessment: 1. Pain due to onychomycosis of toenails of both feet   2. Callus   3. Eczema, unspecified type   4. Diabetes mellitus type 2 with peripheral artery disease ([place])    Plan: -Examined patient. -Continue diabetic foot care principles. -Refilled triamcinolone ointment 0.1% via mail order pharmacy. -Toenails 1-5 b/l were debrided in length and girth with sterile nail nippers and dremel without iatrogenic bleeding.  -Callus(es) submet head 5 left foot pared utilizing sterile scalpel blade  without complication or incident. Total number debrided =1. -Patient to report any pedal injuries to medical professional immediately. -Patient to continue soft, supportive shoe gear daily. -Patient/POA to call should there be question/concern in the interim.  Return in about 3 months (around 04/15/2020).  Marzetta Board, DPM

## 2020-01-21 ENCOUNTER — Encounter: Payer: Self-pay | Admitting: Family Medicine

## 2020-01-21 ENCOUNTER — Ambulatory Visit (INDEPENDENT_AMBULATORY_CARE_PROVIDER_SITE_OTHER): Payer: Medicare Other | Admitting: Family Medicine

## 2020-01-21 ENCOUNTER — Other Ambulatory Visit: Payer: Self-pay

## 2020-01-21 VITALS — BP 133/72 | HR 73 | Resp 16 | Ht 69.0 in | Wt 223.0 lb

## 2020-01-21 DIAGNOSIS — E1159 Type 2 diabetes mellitus with other circulatory complications: Secondary | ICD-10-CM

## 2020-01-21 DIAGNOSIS — I10 Essential (primary) hypertension: Secondary | ICD-10-CM

## 2020-01-21 DIAGNOSIS — Z0000 Encounter for general adult medical examination without abnormal findings: Secondary | ICD-10-CM

## 2020-01-21 DIAGNOSIS — D126 Benign neoplasm of colon, unspecified: Secondary | ICD-10-CM

## 2020-01-21 MED ORDER — GLIPIZIDE ER 2.5 MG PO TB24: 2.5000 mg | ORAL_TABLET | Freq: Every day | ORAL | 1 refills | Status: DC

## 2020-01-21 NOTE — Assessment & Plan Note (Signed)

## 2020-01-21 NOTE — Assessment & Plan Note (Signed)
Uncontrolled , add glipizide 2.5 mg , recommend return to educator Mr. Cayer is reminded of the importance of commitment to daily physical activity for 30 minutes or more, as able and the need to limit carbohydrate intake to 30 to 60 grams per meal to help with blood sugar control.   The need to take medication as prescribed, test blood sugar as directed, and to call between visits if there is a concern that blood sugar is uncontrolled is also discussed.   Mr. Lacek is reminded of the importance of daily foot exam, annual eye examination, and good blood sugar, blood pressure and cholesterol control.  Diabetic Labs Latest Ref Rng & Units 01/16/2020 08/29/2019 05/15/2019 03/05/2019 02/07/2019  HbA1c <5.7 % of total Hgb 7.9(H) 6.5(H) 7.1(H) - -  Microalbumin Not Estab. ug/mL - - - 40.7(H) -  Micro/Creat Ratio 0 - 29 mg/g creat - - - 22 -  Chol <200 mg/dL 131 - 117 - -  HDL > OR = 40 mg/dL 41 - 38(L) - -  Calc LDL mg/dL (calc) 70 - 63 - -  Triglycerides <150 mg/dL 123 - 84 - -  Creatinine 0.70 - 1.25 mg/dL 1.29(H) 1.28(H) 1.25 - 1.25   BP/Weight 01/21/2020 11/25/2019 09/03/2019 06/03/2019 04/09/2019 02/12/2019 02/03/1116  Systolic BP 356 - 701 410 - 301 314  Diastolic BP 72 - 74 68 - 70 70  Wt. (Lbs) 223 217 220 219 218 208 208  BMI 32.93 32.05 32.49 32.34 32.19 30.72 30.72   Foot/eye exam completion dates Latest Ref Rng & Units 01/21/2020 06/18/2019  Eye Exam No Retinopathy - No Retinopathy  Foot exam Order - - -  Foot Form Completion - Done -

## 2020-01-21 NOTE — Progress Notes (Signed)
Robert [name]     MRN: [id number]      DOB: Aug 17, 1950   HPI: Patient is in for annual physical exam. Uncontrolled diabetes is addressed Recent labs,  are reviewed. Immunization is reviewed , and  updated if needed.    PE; BP 133/72   Pulse 73   Resp 16   Ht 5\' 9"  (1.753 m)   Wt 223 lb (101.2 kg)   SpO2 97%   BMI 32.93 kg/m   Pleasant male, alert and oriented x 3, in no cardio-pulmonary distress. Afebrile. HEENT No facial trauma or asymetry. Sinuses non tender. EOMI External ears normal,  Neck: supple, no adenopathy,JVD or thyromegaly.No bruits.  Chest: Clear to ascultation bilaterally.No crackles or wheezes. Non tender to palpation  Cardiovascular system; Heart sounds normal,  S1 and  S2 ,no S3.  No murmur, or thrill. Apical beat not displaced Peripheral pulses normal.  Abdomen: Soft, non tender, no organomegaly or masses. No bruits. Bowel sounds normal. No guarding, tenderness or rebound.    Musculoskeletal exam: Full ROM of spine, hips , shoulders and knees. No deformity ,swelling or crepitus noted. No muscle wasting or atrophy.   Neurologic: Cranial nerves 2 to 12 intact. Power, tone ,sensation and reflexes normal throughout. No disturbance in gait. No tremor.  Skin: Intact, no ulceration, erythema , scaling or rash noted. Pigmentation normal throughout  Psych; Normal mood and affect. Judgement and concentration normal   Assessment & Plan:  Annual physical exam Annual exam as documented. Counseling done  re healthy lifestyle involving commitment to 150 minutes exercise per week, heart healthy diet, and attaining healthy weight.The importance of adequate sleep also discussed. Regular seat belt use and home safety, is also discussed. Changes in health habits are decided on by the patient with goals and time frames  set for achieving them. Immunization and cancer screening needs are specifically addressed at this visit.   Type 2 diabetes  mellitus with vascular disease (HCC) Uncontrolled , add glipizide 2.5 mg , recommend return to educator Mr. Laban is reminded of the importance of commitment to daily physical activity for 30 minutes or more, as able and the need to limit carbohydrate intake to 30 to 60 grams per meal to help with blood sugar control.   The need to take medication as prescribed, test blood sugar as directed, and to call between visits if there is a concern that blood sugar is uncontrolled is also discussed.   Mr. Lovern is reminded of the importance of daily foot exam, annual eye examination, and good blood sugar, blood pressure and cholesterol control.  Diabetic Labs Latest Ref Rng & Units 01/16/2020 08/29/2019 05/15/2019 03/05/2019 02/07/2019  HbA1c <5.7 % of total Hgb 7.9(H) 6.5(H) 7.1(H) - -  Microalbumin Not Estab. ug/mL - - - 40.7(H) -  Micro/Creat Ratio 0 - 29 mg/g creat - - - 22 -  Chol <200 mg/dL 131 - 117 - -  HDL > OR = 40 mg/dL 41 - 38(L) - -  Calc LDL mg/dL (calc) 70 - 63 - -  Triglycerides <150 mg/dL 123 - 84 - -  Creatinine 0.70 - 1.25 mg/dL 1.29(H) 1.28(H) 1.25 - 1.25   BP/Weight 01/21/2020 11/25/2019 09/03/2019 06/03/2019 04/09/2019 02/12/2019 40/11/6759  Systolic BP 950 - 932 671 - 245 809  Diastolic BP 72 - 74 68 - 70 70  Wt. (Lbs) 223 217 220 219 218 208 208  BMI 32.93 32.05 32.49 32.34 32.19 30.72 30.72   Foot/eye exam completion dates Latest  Ref Rng & Units 01/21/2020 06/18/2019  Eye Exam No Retinopathy - No Retinopathy  Foot exam Order - - -  Foot Form Completion - Done -

## 2020-01-21 NOTE — Patient Instructions (Addendum)
F/u in office with MD last week in January, call if you need me sooner  Add glipizide 2.5 mg daily to medication you take now for your blood sugar as it is high, sent to [facility] Increase blood sugar testing to 3 times daily until numbers settle out  Please reschedule with diabetic educator if blood sugar still high  It is important that you exercise regularly at least 30 minutes 5 times a week. If you develop chest pain, have severe difficulty breathing, or feel very tired, stop exercising immediately and seek medical attention   Microalb, non fast hBA1C, chem 7 and EGFR 3 days before January appointment  Think about what you will eat, plan ahead. Choose " clean, green, fresh or frozen" over canned, processed or packaged foods which are more sugary, salty and fatty. 70 to 75% of food eaten should be vegetables and fruit. Three meals at set times with snacks allowed between meals, but they must be fruit or vegetables. Aim to eat over a 12 hour period , example 7 am to 7 pm, and STOP after  your last meal of the day. Drink water,generally about 64 ounces per day, no other drink is as healthy. Fruit juice is best enjoyed in a healthy way, by EATING the fruit. Thanks for choosing [facility], we consider it a privelige to serve you.

## 2020-01-22 ENCOUNTER — Telehealth: Payer: Self-pay

## 2020-01-23 ENCOUNTER — Other Ambulatory Visit: Payer: Self-pay

## 2020-01-23 MED ORDER — GLIPIZIDE ER 5 MG PO TB24: 5.0000 mg | ORAL_TABLET | Freq: Every day | ORAL | 1 refills | Status: DC

## 2020-02-08 ENCOUNTER — Other Ambulatory Visit: Payer: Self-pay | Admitting: Family Medicine

## 2020-02-18 ENCOUNTER — Ambulatory Visit (INDEPENDENT_AMBULATORY_CARE_PROVIDER_SITE_OTHER): Payer: Medicare Other | Admitting: Cardiology

## 2020-02-18 ENCOUNTER — Other Ambulatory Visit: Payer: Self-pay

## 2020-02-18 ENCOUNTER — Encounter: Payer: Self-pay | Admitting: Cardiology

## 2020-02-18 VITALS — BP 130/78 | HR 88 | Ht 69.0 in | Wt 224.0 lb

## 2020-02-18 DIAGNOSIS — I10 Essential (primary) hypertension: Secondary | ICD-10-CM

## 2020-02-18 DIAGNOSIS — E782 Mixed hyperlipidemia: Secondary | ICD-10-CM | POA: Diagnosis not present

## 2020-02-18 DIAGNOSIS — I2510 Atherosclerotic heart disease of native coronary artery without angina pectoris: Secondary | ICD-10-CM | POA: Diagnosis not present

## 2020-02-18 NOTE — Patient Instructions (Signed)

## 2020-02-18 NOTE — Progress Notes (Signed)
Clinical Summary Robert [name] is a 69 y.o.male seen today for follow up of the following medical problems.   1. CAD - history of prior stenting as described below -08/2014 exercise nuclear stress with Duke treadmill score of 7 consistent with low risk, small area of inferolateral ischemia. Mild inferior ischemia has been noted previously in 12/2009 stress, from notes he has history of RCA CTO that was not able to be opened during 2003 cath in [place].   - mild infrequent chest pains, nonspecific in character - compliant with meds   2. HTN - compliatn with meds  3. Hyperlipidemia 01/2020: TC 131 HDL 41 TG 123 LDL 70 - compliant with statin  4. DM II - followed by Dr Moshe Cipro   5. OSA -mixed compliancewith cpap  6. AAA screen - no aneurysm by [place] 08/2010  Has had covid vaccine pfizer x 3 Past Medical History:  Diagnosis Date  . Arteriosclerotic cardiovascular disease (ASCVD)    DES to LAD D1 - 2003 in [place]; failed intervention for a totally obstructed RCA at that time; EF of 45%; 12/2009: Equivocal stress nuclear with good exercise tolerance, negative stress EKG, normal EF with mild mid and distal inferior ischemia  . Coronary artery disease   . Diabetes mellitus, insulin dependent (IDDM), controlled 10/25/2012   HBA1C is 6.9 on 10/22/2012   . Diabetes mellitus, type II ([place])   . Hyperlipidemia    Lipid profile in 09/2011:128, 130, 33, 69  . Hypertension    Lab  09/2011: Normal CMet ex G-133  . Tobacco abuse, in remission    30 pack years; quit in 1985     Allergies  Allergen Reactions  . Rosuvastatin Other (See Comments)    Adverse GI symptoms     Current Outpatient Medications  Medication Sig Dispense Refill  . ACCU-CHEK FASTCLIX LANCETS MISC Use up to three times daily Dx e11.22 300 each 1  . ACCU-CHEK SMARTVIEW test strip USE AS INSTRUCTED 3 TIMES  DAILY 300 strip 3  . acetaminophen (TYLENOL) 500 MG tablet Take 500 mg by mouth as needed.    Marland Kitchen  aspirin 81 MG tablet Take 81 mg by mouth daily.      Marland Kitchen atorvastatin (LIPITOR) 40 MG tablet Take 1 tablet (40 mg total) by mouth daily. 90 tablet 3  . Blood Glucose Monitoring Suppl (ACCU-CHEK NANO SMARTVIEW) w/Device KIT TEST 1 TO 2 TIMES DAILY 1 kit 0  . glipiZIDE (GLIPIZIDE XL) 5 MG 24 hr tablet Take 1 tablet (5 mg total) by mouth daily with breakfast. 90 tablet 1  . glipiZIDE (GLUCOTROL XL) 2.5 MG 24 hr tablet Take 1 tablet (2.5 mg total) by mouth daily with breakfast. 90 tablet 1  . Insulin Pen Needle (B-D ULTRAFINE III SHORT PEN) 31G X 8 MM MISC USE AS DIRECTED WITH  LEVEMIR FLEXPEN DAILY 100 each 5  . Insulin Syringe-Needle U-100 (INSULIN SYRINGE .5CC/30GX5/16") 30G X 5/16" 0.5 ML MISC Use as directed with novolog 70/30 100 each 3  . LANTUS SOLOSTAR 100 UNIT/ML Solostar Pen INJECT SUBCUTANEOUSLY 70  UNITS DAILY 75 mL 3  . metoprolol succinate (TOPROL-XL) 50 MG 24 hr tablet TAKE 1 TABLET BY MOUTH  DAILY WITH OR IMMEDIATELY  AFTER A MEAL 90 tablet 3  . nitroGLYCERIN (NITROSTAT) 0.4 MG SL tablet Place 1 tablet (0.4 mg total) under the tongue every 5 (five) minutes as needed for chest pain. 25 tablet 3  . nystatin (MYCOSTATIN/NYSTOP) powder Apply between toes and under toes  every morning 45 g 2  . olmesartan (BENICAR) 20 MG tablet TAKE 1 TABLET BY MOUTH  DAILY (DOSE REDUCTION) 90 tablet 3  . sildenafil (VIAGRA) 100 MG tablet Take 1 tablet (100 mg total) by mouth daily as needed for erectile dysfunction. 10 tablet 3  . triamcinolone ointment (KENALOG) 0.1 % Apply 1 application topically 2 (two) times daily. 454 g 1   No current facility-administered medications for this visit.     Past Surgical History:  Procedure Laterality Date  . CARDIAC CATHETERIZATION    . COLONOSCOPY  2010   Negative screening study  . COLONOSCOPY N/A 11/11/2014   Procedure: COLONOSCOPY;  Surgeon: Rogene Houston, MD;  Location: [facility];  Service: Endoscopy;  Laterality: N/A;  1030  . EPIDIDYMIS SURGERY  1996    . KNEE ARTHROSCOPY W/ MENISCECTOMY  11/2008   Right  . PRESSURE ULCER DEBRIDEMENT  2006   Right lower extremity  . ROTATOR CUFF REPAIR  07/2009   Right     Allergies  Allergen Reactions  . Rosuvastatin Other (See Comments)    Adverse GI symptoms      Family History  Problem Relation Age of Onset  . Diabetes Sister   . Arthritis Brother   . Diabetes Brother   . Arthritis Mother   . Cancer Father        Hypernephroma     Social History [name] reports that he quit smoking about 36 years ago. His smoking use included cigarettes. He started smoking about 53 years ago. He has a 30.00 pack-year smoking history. He has never used smokeless tobacco. [name] reports no history of alcohol use.   Review of Systems CONSTITUTIONAL: No weight loss, fever, chills, weakness or fatigue.  HEENT: Eyes: No visual loss, blurred vision, double vision or yellow sclerae.No hearing loss, sneezing, congestion, runny nose or sore throat.  SKIN: No rash or itching.  CARDIOVASCULAR: per hpi RESPIRATORY: No shortness of breath, cough or sputum.  GASTROINTESTINAL: No anorexia, nausea, vomiting or diarrhea. No abdominal pain or blood.  GENITOURINARY: No burning on urination, no polyuria NEUROLOGICAL: No headache, dizziness, syncope, paralysis, ataxia, numbness or tingling in the extremities. No change in bowel or bladder control.  MUSCULOSKELETAL: No muscle, back pain, joint pain or stiffness.  LYMPHATICS: No enlarged nodes. No history of splenectomy.  PSYCHIATRIC: No history of depression or anxiety.  ENDOCRINOLOGIC: No reports of sweating, cold or heat intolerance. No polyuria or polydipsia.  Marland Kitchen   Physical Examination Today's Vitals   02/18/20 1610  BP: 130/78  Pulse: 88  SpO2: 98%  Weight: 224 lb (101.6 kg)  Height: 5' 9" (1.753 m)   Body mass index is 33.08 kg/m.  Gen: resting comfortably, no acute distress HEENT: no scleral icterus, pupils equal round and reactive, no  palptable cervical adenopathy,  CV: RRR, no mrg, no jvd Resp: Clear to auscultation bilaterally GI: abdomen is soft, non-tender, non-distended, normal bowel sounds, no hepatosplenomegaly MSK: extremities are warm, no edema.  Skin: warm, no rash Neuro:  no focal deficits Psych: appropriate affect   Diagnostic Studies  08/2014 Exercise Nuclear stress  Low risk Duke treadmill score of 7.  Blood pressure demonstrated a hypertensive response to exercise.  There was no ST segment deviation noted during stress.  There is a small defect of mild severity present in the basal inferolateral, mid inferolateral and apical inferior location. The defect is reversible. Consistent with mild, predominantly mid to basal inferolateral ischemia.  Myocardial perfusion is abnormal.  This is a low risk study.    Assessment and Plan  1. CAD -no symptoms, continue current meds - EKG shows SR, no ischemic changes.   2. HTN - at goal, continue current meds  3. Hyperlipidemia - lipids at goal, continue statin   F/u 6 months    Arnoldo Lenis, MD

## 2020-02-26 ENCOUNTER — Other Ambulatory Visit: Payer: Self-pay | Admitting: Student

## 2020-04-21 ENCOUNTER — Ambulatory Visit: Payer: Medicare Other | Admitting: Podiatry

## 2020-04-21 ENCOUNTER — Encounter: Payer: Self-pay | Admitting: Podiatry

## 2020-04-21 ENCOUNTER — Other Ambulatory Visit: Payer: Self-pay

## 2020-04-21 DIAGNOSIS — M79675 Pain in left toe(s): Secondary | ICD-10-CM | POA: Diagnosis not present

## 2020-04-21 DIAGNOSIS — L309 Dermatitis, unspecified: Secondary | ICD-10-CM | POA: Diagnosis not present

## 2020-04-21 DIAGNOSIS — M79674 Pain in right toe(s): Secondary | ICD-10-CM

## 2020-04-21 DIAGNOSIS — E1151 Type 2 diabetes mellitus with diabetic peripheral angiopathy without gangrene: Secondary | ICD-10-CM

## 2020-04-21 DIAGNOSIS — B351 Tinea unguium: Secondary | ICD-10-CM

## 2020-04-21 MED ORDER — TRIAMCINOLONE ACETONIDE 0.1 % EX OINT: 1.0000 "application " | TOPICAL_OINTMENT | Freq: Two times a day (BID) | CUTANEOUS | 2 refills | Status: DC

## 2020-04-21 NOTE — Progress Notes (Signed)
Subjective: Robert [name] presents today preventative diabetic foot care and painful callus(es) left foot and painful thick toenails that are difficult to trim. Pain interferes with ambulation. Aggravating factors include wearing enclosed shoe gear. Pain is relieved with periodic professional debridement.  Patient states his blood glucose was 129 mg/dl yesterday morning. He did not check it this morning as he was rushing for his appointment today. He also has not taken any medication today, but he will when he gets back home.  He is requesting refill of his triamcinolone ointment for his eczema today. He states he will ask PCP for dermatology referral as he feels he is having more outbreaks lately.  Robert Helper, MD is patient's PCP. Last visit was: 01/21/2020.  Past Medical History:  Diagnosis Date  . Arteriosclerotic cardiovascular disease (ASCVD)    DES to LAD D1 - 2003 in [place]; failed intervention for a totally obstructed RCA at that time; EF of 45%; 12/2009: Equivocal stress nuclear with good exercise tolerance, negative stress EKG, normal EF with mild mid and distal inferior ischemia  . Coronary artery disease   . Diabetes mellitus, insulin dependent (IDDM), controlled 10/25/2012   HBA1C is 6.9 on 10/22/2012   . Diabetes mellitus, type II ([place])   . Hyperlipidemia    Lipid profile in 09/2011:128, 130, 33, 69  . Hypertension    Lab  09/2011: Normal CMet ex G-133  . Tobacco abuse, in remission    30 pack years; quit in 1985     Patient Active Problem List   Diagnosis Date Noted  . Right hip pain 02/16/2019  . Knee pain, right 11/05/2018  . Type 2 diabetes mellitus with vascular disease ([place]) 03/05/2018  . Dysphagia 03/05/2018  . Annual physical exam 08/15/2016  . Hypersomnia with sleep apnea 01/16/2016  . Seasonal allergies 01/04/2015  . Tubular adenoma of colon 11/13/2014  . ASCVD (arteriosclerotic cardiovascular disease) 08/21/2014  . Bilateral hand pain 04/06/2014  .  Back pain 09/16/2013  . Essential hypertension   . Hyperlipidemia LDL goal <70   . Obesity (BMI 30.0-34.9) 12/24/2011  . ED (erectile dysfunction) 02/14/2011  . Cardiovascular disease 09/27/2009  . TOBACCO ABUSE, HX OF 09/27/2009    Current Outpatient Medications on File Prior to Visit  Medication Sig Dispense Refill  . ACCU-CHEK FASTCLIX LANCETS MISC Use up to three times daily Dx e11.22 300 each 1  . ACCU-CHEK SMARTVIEW test strip USE AS INSTRUCTED 3 TIMES  DAILY 300 strip 3  . acetaminophen (TYLENOL) 500 MG tablet Take 500 mg by mouth as needed.    Marland Kitchen aspirin 81 MG tablet Take 81 mg by mouth daily.    Marland Kitchen atorvastatin (LIPITOR) 40 MG tablet TAKE 1 TABLET BY MOUTH  DAILY 90 tablet 3  . Blood Glucose Monitoring Suppl (ACCU-CHEK NANO SMARTVIEW) w/Device KIT TEST 1 TO 2 TIMES DAILY 1 kit 0  . glipiZIDE (GLIPIZIDE XL) 5 MG 24 hr tablet Take 1 tablet (5 mg total) by mouth daily with breakfast. 90 tablet 1  . glipiZIDE (GLUCOTROL XL) 2.5 MG 24 hr tablet Take 1 tablet (2.5 mg total) by mouth daily with breakfast. 90 tablet 1  . Insulin Pen Needle (B-D ULTRAFINE III SHORT PEN) 31G X 8 MM MISC USE AS DIRECTED WITH  LEVEMIR FLEXPEN DAILY 100 each 5  . Insulin Syringe-Needle U-100 (INSULIN SYRINGE .5CC/30GX5/16") 30G X 5/16" 0.5 ML MISC Use as directed with novolog 70/30 100 each 3  . LANTUS SOLOSTAR 100 UNIT/ML Solostar Pen INJECT SUBCUTANEOUSLY 70  UNITS DAILY 75 mL 3  . metoprolol succinate (TOPROL-XL) 50 MG 24 hr tablet TAKE 1 TABLET BY MOUTH  DAILY WITH OR IMMEDIATELY  AFTER A MEAL 90 tablet 3  . nitroGLYCERIN (NITROSTAT) 0.4 MG SL tablet Place 1 tablet (0.4 mg total) under the tongue every 5 (five) minutes as needed for chest pain. 25 tablet 3  . nystatin (MYCOSTATIN/NYSTOP) powder Apply between toes and under toes every morning 45 g 2  . olmesartan (BENICAR) 20 MG tablet TAKE 1 TABLET BY MOUTH  DAILY 90 tablet 3  . sildenafil (VIAGRA) 100 MG tablet Take 1 tablet (100 mg total) by mouth daily as  needed for erectile dysfunction. 10 tablet 3   No current facility-administered medications on file prior to visit.     Allergies  Allergen Reactions  . Rosuvastatin Other (See Comments)    Adverse GI symptoms    Objective: Robert [name] is a pleasant 70 y.o. African American male WD, WN in NAD. AAO x 3.  There were no vitals filed for this visit.  Vascular Examination: Neurovascular status unchanged b/l lower extremities. Faintly palpable DP pulse(s) b/l lower extremities. Nonpalpable PT pulse(s) b/l lower extremities. Pedal hair absent. Lower extremity skin temperature gradient within normal limits. No pain with calf compression b/l. No edema noted b/l lower extremities. No ischemia or gangrene noted b/l lower extremities.  Dermatological Examination: Pedal skin with normal turgor, texture and tone bilaterally. No open wounds bilaterally. No interdigital macerations bilaterally. Toenails 1-5 b/l elongated, discolored, dystrophic, thickened, crumbly with subungual debris and tenderness to dorsal palpation.   Eczematous patches dorsal aspect of both feet.  Musculoskeletal: Normal muscle strength 5/5 to all lower extremity muscle groups bilaterally. No pain crepitus or joint limitation noted with ROM b/l. No gross bony deformities bilaterally. Patient ambulates independent of any assistive aids.  Neurological Examination: Protective sensation intact 5/5 intact bilaterally with 10g monofilament b/l. Vibratory sensation intact b/l. Proprioception intact bilaterally. Clonus negative b/l.  Last A1c: Hemoglobin A1C Latest Ref Rng & Units 01/16/2020 08/29/2019 05/15/2019  HGBA1C <5.7 % of total Hgb 7.9(H) 6.5(H) 7.1(H)  Some recent data might be hidden   Assessment: 1. Pain due to onychomycosis of toenails of both feet   2. Eczema, unspecified type   3. Diabetes mellitus type 2 with peripheral artery disease ([place])    Plan: -Examined patient. -Continue diabetic foot care  principles. -Refilled triamcinolone ointment 0.1% via mail order pharmacy. -Toenails 1-5 b/l were debrided in length and girth with sterile nail nippers and dremel without iatrogenic bleeding.  -Patient to report any pedal injuries to medical professional immediately. -Patient to continue soft, supportive shoe gear daily. -Patient/POA to call should there be question/concern in the interim.  Return in about 3 months (around 07/20/2020).  Marzetta Board, DPM

## 2020-04-22 DIAGNOSIS — I10 Essential (primary) hypertension: Secondary | ICD-10-CM | POA: Diagnosis not present

## 2020-04-22 DIAGNOSIS — E1159 Type 2 diabetes mellitus with other circulatory complications: Secondary | ICD-10-CM | POA: Diagnosis not present

## 2020-04-24 LAB — BMP8+EGFR
BUN/Creatinine Ratio: 12 (ref 10–24)
BUN: 16 mg/dL (ref 8–27)
CO2: 24 mmol/L (ref 20–29)
Calcium: 9.3 mg/dL (ref 8.6–10.2)
Chloride: 100 mmol/L (ref 96–106)
Creatinine, Ser: 1.32 mg/dL — ABNORMAL HIGH (ref 0.76–1.27)
GFR calc Af Amer: 64 mL/min/{1.73_m2} (ref >59)
GFR calc non Af Amer: 55 mL/min/{1.73_m2} — ABNORMAL LOW (ref >59)
Glucose: 121 mg/dL — ABNORMAL HIGH (ref 65–99)
Potassium: 4.8 mmol/L (ref 3.5–5.2)
Sodium: 139 mmol/L (ref 134–144)

## 2020-04-24 LAB — HEMOGLOBIN A1C
Est. average glucose Bld gHb Est-mCnc: 194 mg/dL
Hgb A1c MFr Bld: 8.4 % — ABNORMAL HIGH (ref 4.8–5.6)

## 2020-04-24 LAB — MICROALBUMIN, URINE: Microalbumin, Urine: 69.7 ug/mL

## 2020-04-29 ENCOUNTER — Other Ambulatory Visit: Payer: Self-pay

## 2020-04-29 ENCOUNTER — Telehealth (INDEPENDENT_AMBULATORY_CARE_PROVIDER_SITE_OTHER): Payer: Medicare Other | Admitting: Family Medicine

## 2020-04-29 ENCOUNTER — Encounter: Payer: Self-pay | Admitting: Family Medicine

## 2020-04-29 VITALS — BP 116/60 | Ht 69.0 in | Wt 219.0 lb

## 2020-04-29 DIAGNOSIS — R21 Rash and other nonspecific skin eruption: Secondary | ICD-10-CM

## 2020-04-29 DIAGNOSIS — I10 Essential (primary) hypertension: Secondary | ICD-10-CM | POA: Diagnosis not present

## 2020-04-29 DIAGNOSIS — E785 Hyperlipidemia, unspecified: Secondary | ICD-10-CM

## 2020-04-29 DIAGNOSIS — I2510 Atherosclerotic heart disease of native coronary artery without angina pectoris: Secondary | ICD-10-CM

## 2020-04-29 DIAGNOSIS — E669 Obesity, unspecified: Secondary | ICD-10-CM

## 2020-04-29 DIAGNOSIS — E1159 Type 2 diabetes mellitus with other circulatory complications: Secondary | ICD-10-CM

## 2020-04-29 NOTE — Patient Instructions (Signed)
F/U with blood sugar values in 4   weeks, call if you need me sooner  Start 50 units lantus daily and continue glipizide you are currently on Test and record at least 2 times daily, fasting and bedtime,  fasting and 2 hours after lunc  Goal for fasting blood sugar ranges from 90 to 130 and 2 hours after any meal or at bedtime should be between 130 to 180.  It is important that you exercise regularly at least 30 minutes 5 times a week. If you develop chest pain, have severe difficulty breathing, or feel very tired, stop exercising immediately and seek medical attention   Think about what you will eat, plan ahead. Choose " clean, green, fresh or frozen" over canned, processed or packaged foods which are more sugary, salty and fatty. 70 to 75% of food eaten should be vegetables and fruit. Three meals at set times with snacks allowed between meals, but they must be fruit or vegetables. Aim to eat over a 12 hour period , example 7 am to 7 pm, and STOP after  your last meal of the day. Drink water,generally about 64 ounces per day, no other drink is as healthy. Fruit juice is best enjoyed in a healthy way, by EATING the fruit.

## 2020-05-04 ENCOUNTER — Encounter: Payer: Self-pay | Admitting: Family Medicine

## 2020-05-04 DIAGNOSIS — R21 Rash and other nonspecific skin eruption: Secondary | ICD-10-CM | POA: Insufficient documentation

## 2020-05-04 NOTE — Progress Notes (Signed)
Virtual Visit via Telephone Note  I connected with Robert [name] on 05/04/20 at  9:00 AM EST by telephone and verified that I am speaking with the correct person using two identifiers.  Location: Patient: home Provider: work   I discussed the limitations, risks, security and privacy concerns of performing an evaluation and management service by telephone and the availability of in person appointments. I also discussed with the patient that there may be a patient responsible charge related to this service. The patient expressed understanding and agreed to proceed.   History of Present Illness: F/U chronic problems and address any new or current concerns. Review and update medications and allergies. Review recent lab and radiologic data . Update routine health maintainace. Review an encourage improved health habits to include nutrition, exercise and  sleep .No regular exercise and food choice is often poor .Blood sugar not tested daily and often above 200  Denies recent fever or chills. Denies sinus pressure, nasal congestion, ear pain or sore throat. Denies chest congestion, productive cough or wheezing. Denies chest pains, palpitations and leg swelling Denies abdominal pain, nausea, vomiting,diarrhea or constipation.   Denies dysuria, frequency, hesitancy or incontinence. Denies uncontrolled  joint pain, swelling and limitation in mobility. Denies headaches, seizures, numbness, or tingling. Denies depression, anxiety or insomnia. C/o dry itchy skin ants derm referral     Observations/Objective: BP 116/60   Ht 5\' 9"  (1.753 m)   Wt 219 lb (99.3 kg)   BMI 32.34 kg/m  Good communication with no confusion and intact memory. Alert and oriented x 3 No signs of respiratory distress during speech    Assessment and Plan: Type 2 diabetes mellitus with vascular disease [facility]) Robert [name] is reminded of the importance of commitment to daily physical activity for 30 minutes or more,  as able and the need to limit carbohydrate intake to 30 to 60 grams per meal to help with blood sugar control.   The need to take medication as prescribed, test blood sugar as directed, and to call between visits if there is a concern that blood sugar is uncontrolled is also discussed.  Increase lantus to 50 units daily  Robert [name] is reminded of the importance of daily foot exam, annual eye examination, and good blood sugar, blood pressure and cholesterol control. Deteriorated and uncontrolled, needs to monitor blood sugar daily, also start regular exercise and change food choice, offered nutrition education and he refuses F/u in 6 weeks with log  Diabetic Labs Latest Ref Rng & Units 04/22/2020 01/16/2020 08/29/2019 05/15/2019 03/05/2019  HbA1c 4.8 - 5.6 % 8.4(H) 7.9(H) 6.5(H) 7.1(H) -  Microalbumin Not Estab. ug/mL - - - - 40.7(H)  Micro/Creat Ratio 0 - 29 mg/g creat - - - - 22  Chol <200 mg/dL - 131 - 117 -  HDL > OR = 40 mg/dL - 41 - 38(L) -  Calc LDL mg/dL (calc) - 70 - 63 -  Triglycerides <150 mg/dL - 123 - 84 -  Creatinine 0.76 - 1.27 mg/dL 1.32(H) 1.29(H) 1.28(H) 1.25 -   BP/Weight 04/29/2020 02/18/2020 01/21/2020 11/25/2019 09/03/2019 4/0/9811 12/02/4780  Systolic BP 956 213 086 - 578 469 -  Diastolic BP 60 78 72 - 74 68 -  Wt. (Lbs) 219 224 223 217 220 219 218  BMI 32.34 33.08 32.93 32.05 32.49 32.34 32.19   Foot/eye exam completion dates Latest Ref Rng & Units 01/21/2020 06/18/2019  Eye Exam No Retinopathy - No Retinopathy  Foot exam Order - - -  Foot Form Completion - Done -        Essential hypertension Controlled, no change in medication DASH diet and commitment to daily physical activity for a minimum of 30 minutes discussed and encouraged, as a part of hypertension management. The importance of attaining a healthy weight is also discussed.  BP/Weight 04/29/2020 02/18/2020 01/21/2020 11/25/2019 09/03/2019 04/08/1094 0/07/5407  Systolic BP 811 914 782 - 956 213 -  Diastolic BP  60 78 72 - 74 68 -  Wt. (Lbs) 219 224 223 217 220 219 218  BMI 32.34 33.08 32.93 32.05 32.49 32.34 32.19       Hyperlipidemia LDL goal <70 Hyperlipidemia:Low fat diet discussed and encouraged.   Lipid Panel  Lab Results  Component Value Date   CHOL 131 01/16/2020   HDL 41 01/16/2020   LDLCALC 70 01/16/2020   TRIG 123 01/16/2020   CHOLHDL 3.2 01/16/2020     Controlled, no change in medication   Obesity (BMI 30.0-34.9)  Patient re-educated about  the importance of commitment to a  minimum of 150 minutes of exercise per week as able.  The importance of healthy food choices with portion control discussed, as well as eating regularly and within a 12 hour window most days. The need to choose "clean , green" food 50 to 75% of the time is discussed, as well as to make water the primary drink and set a goal of 64 ounces water daily.    Weight /BMI 04/29/2020 02/18/2020 01/21/2020  WEIGHT 219 lb 224 lb 223 lb  HEIGHT 5\' 9"  5\' 9"  5\' 9"   BMI 32.34 kg/m2 33.08 kg/m2 32.93 kg/m2      ASCVD (arteriosclerotic cardiovascular disease) Denies chest pain or increased exertional fatigue, needs to commit to regular exercise   Rash and nonspecific skin eruption C/o dry skin and rash requests derm referral    Follow Up Instructions:    I discussed the assessment and treatment plan with the patient. The patient was provided an opportunity to ask questions and all were answered. The patient agreed with the plan and demonstrated an understanding of the instructions.   The patient was advised to call back or seek an in-person evaluation if the symptoms worsen or if the condition fails to improve as anticipated.  I provided 25 minutes of non-face-to-face time during this encounter.   Tula Nakayama, MD

## 2020-05-04 NOTE — Assessment & Plan Note (Addendum)
Mr. Boulton is reminded of the importance of commitment to daily physical activity for 30 minutes or more, as able and the need to limit carbohydrate intake to 30 to 60 grams per meal to help with blood sugar control.   The need to take medication as prescribed, test blood sugar as directed, and to call between visits if there is a concern that blood sugar is uncontrolled is also discussed.  Increase lantus to 50 units daily  Mr. Holahan is reminded of the importance of daily foot exam, annual eye examination, and good blood sugar, blood pressure and cholesterol control. Deteriorated and uncontrolled, needs to monitor blood sugar daily, also start regular exercise and change food choice, offered nutrition education and he refuses F/u in 6 weeks with log  Diabetic Labs Latest Ref Rng & Units 04/22/2020 01/16/2020 08/29/2019 05/15/2019 03/05/2019  HbA1c 4.8 - 5.6 % 8.4(H) 7.9(H) 6.5(H) 7.1(H) -  Microalbumin Not Estab. ug/mL - - - - 40.7(H)  Micro/Creat Ratio 0 - 29 mg/g creat - - - - 22  Chol <200 mg/dL - 962 - 952 -  HDL > OR = 40 mg/dL - 41 - 84(X) -  Calc LDL mg/dL (calc) - 70 - 63 -  Triglycerides <150 mg/dL - 324 - 84 -  Creatinine 0.76 - 1.27 mg/dL 4.01(U) 2.72(Z) 3.66(Y) 1.25 -   BP/Weight 04/29/2020 02/18/2020 01/21/2020 11/25/2019 09/03/2019 06/03/2019 04/09/2019  Systolic BP 116 130 133 - 126 403 -  Diastolic BP 60 78 72 - 74 68 -  Wt. (Lbs) 219 224 223 217 220 219 218  BMI 32.34 33.08 32.93 32.05 32.49 32.34 32.19   Foot/eye exam completion dates Latest Ref Rng & Units 01/21/2020 06/18/2019  Eye Exam No Retinopathy - No Retinopathy  Foot exam Order - - -  Foot Form Completion - Done -

## 2020-05-04 NOTE — Assessment & Plan Note (Signed)
  Patient re-educated about  the importance of commitment to a  minimum of 150 minutes of exercise per week as able.  The importance of healthy food choices with portion control discussed, as well as eating regularly and within a 12 hour window most days. The need to choose "clean , green" food 50 to 75% of the time is discussed, as well as to make water the primary drink and set a goal of 64 ounces water daily.    Weight /BMI 04/29/2020 02/18/2020 01/21/2020  WEIGHT 219 lb 224 lb 223 lb  HEIGHT 5\' 9"  5\' 9"  5\' 9"   BMI 32.34 kg/m2 33.08 kg/m2 32.93 kg/m2

## 2020-05-04 NOTE — Assessment & Plan Note (Signed)
Denies chest pain or increased exertional fatigue, needs to commit to regular exercise

## 2020-05-04 NOTE — Assessment & Plan Note (Signed)
C/o dry skin and rash requests derm referral

## 2020-05-04 NOTE — Assessment & Plan Note (Signed)
Controlled, no change in medication DASH diet and commitment to daily physical activity for a minimum of 30 minutes discussed and encouraged, as a part of hypertension management. The importance of attaining a healthy weight is also discussed.  BP/Weight 04/29/2020 02/18/2020 01/21/2020 11/25/2019 09/03/2019 09/04/158 1/0/9323  Systolic BP 557 322 025 - 427 062 -  Diastolic BP 60 78 72 - 74 68 -  Wt. (Lbs) 219 224 223 217 220 219 218  BMI 32.34 33.08 32.93 32.05 32.49 32.34 32.19

## 2020-05-04 NOTE — Assessment & Plan Note (Signed)
Hyperlipidemia:Low fat diet discussed and encouraged.   Lipid Panel  Lab Results  Component Value Date   CHOL 131 01/16/2020   HDL 41 01/16/2020   LDLCALC 70 01/16/2020   TRIG 123 01/16/2020   CHOLHDL 3.2 01/16/2020     Controlled, no change in medication

## 2020-05-31 ENCOUNTER — Telehealth: Payer: Medicare Other | Admitting: Family Medicine

## 2020-06-02 ENCOUNTER — Ambulatory Visit (INDEPENDENT_AMBULATORY_CARE_PROVIDER_SITE_OTHER): Payer: Medicare Other | Admitting: Nurse Practitioner

## 2020-06-02 ENCOUNTER — Encounter: Payer: Self-pay | Admitting: Nurse Practitioner

## 2020-06-02 ENCOUNTER — Other Ambulatory Visit: Payer: Self-pay

## 2020-06-02 VITALS — BP 131/69 | HR 67 | Temp 98.0°F | Resp 18 | Ht 69.0 in | Wt 224.0 lb

## 2020-06-02 DIAGNOSIS — I2510 Atherosclerotic heart disease of native coronary artery without angina pectoris: Secondary | ICD-10-CM

## 2020-06-02 DIAGNOSIS — R21 Rash and other nonspecific skin eruption: Secondary | ICD-10-CM

## 2020-06-02 DIAGNOSIS — L309 Dermatitis, unspecified: Secondary | ICD-10-CM | POA: Diagnosis not present

## 2020-06-02 DIAGNOSIS — E1159 Type 2 diabetes mellitus with other circulatory complications: Secondary | ICD-10-CM

## 2020-06-02 DIAGNOSIS — I10 Essential (primary) hypertension: Secondary | ICD-10-CM

## 2020-06-02 MED ORDER — TRIAMCINOLONE ACETONIDE 0.1 % EX OINT: 1.0000 "application " | TOPICAL_OINTMENT | Freq: Two times a day (BID) | CUTANEOUS | 2 refills | Status: DC

## 2020-06-02 MED ORDER — GLIPIZIDE 10 MG PO TABS: 10.0000 mg | ORAL_TABLET | Freq: Two times a day (BID) | ORAL | 3 refills | Status: DC

## 2020-06-02 NOTE — Progress Notes (Signed)
Acute Office Visit  Subjective:    Patient ID: Robert [name], male    DOB: January 20, 1951, 70 y.o.   MRN: [id number]  Chief Complaint  Patient presents with  . Diabetes    Follow up on A1c     HPI Patient is in today for blood sugar check. At his last visit on 04/29/20 he was started on lantus 50 units daily, and instructed to check his blood sugar BID.  Goal for fasting blood sugar ranges from 90 to 130 and 2 hours after any meal or at bedtime should be between 130 to 180.  Last A1c check was on 04/22/20.  He states his blood sugars have been in the 100s-120s usually, but he has had some highs in the 160s-200s. He had a low of 62.  He increased his lantus to 55 units per night and increased his glipizide to 10 mg daily a few days after talking with Dr. Moshe Cipro. He states it was around 05/03/20 when he increased his medication  Past Medical History:  Diagnosis Date  . Arteriosclerotic cardiovascular disease (ASCVD)    DES to LAD D1 - 2003 in [place]; failed intervention for a totally obstructed RCA at that time; EF of 45%; 12/2009: Equivocal stress nuclear with good exercise tolerance, negative stress EKG, normal EF with mild mid and distal inferior ischemia  . Coronary artery disease   . Diabetes mellitus without complication ([place])    Phreesia 04/26/2020  . Diabetes mellitus, insulin dependent (IDDM), controlled 10/25/2012   HBA1C is 6.9 on 10/22/2012   . Diabetes mellitus, type II ([place])   . Hyperlipidemia    Lipid profile in 09/2011:128, 130, 33, 69  . Hypertension    Lab  09/2011: Normal CMet ex G-133  . Tobacco abuse, in remission    30 pack years; quit in 1985    Past Surgical History:  Procedure Laterality Date  . CARDIAC CATHETERIZATION    . COLONOSCOPY  2010   Negative screening study  . COLONOSCOPY N/A 11/11/2014   Procedure: COLONOSCOPY;  Surgeon: Rogene Houston, MD;  Location: [facility];  Service: Endoscopy;  Laterality: N/A;  1030  . EPIDIDYMIS SURGERY  1996   . KNEE ARTHROSCOPY W/ MENISCECTOMY  11/2008   Right  . PRESSURE ULCER DEBRIDEMENT  2006   Right lower extremity  . ROTATOR CUFF REPAIR  07/2009   Right    Family History  Problem Relation Age of Onset  . Diabetes Sister   . Arthritis Brother   . Diabetes Brother   . Arthritis Mother   . Cancer Father        Hypernephroma    Social History   Socioeconomic History  . Marital status: Married    Spouse name: Not on file  . Number of children: 2  . Years of education: Not on file  . Highest education level: Some college, no degree  Occupational History  . Occupation: Retired  Tobacco Use  . Smoking status: Former Smoker    Packs/day: 1.00    Years: 30.00    Pack years: 30.00    Types: Cigarettes    Start date: 04/03/1966    Quit date: 09/02/1983    Years since quitting: 36.7  . Smokeless tobacco: Never Used  Vaping Use  . Vaping Use: Never used  Substance and Sexual Activity  . Alcohol use: No    Alcohol/week: 0.0 standard drinks    Comment: Former Tax inspector -discontinued use in 2005  . Drug use:  No  . Sexual activity: Yes  Other Topics Concern  . Not on file  Social History Narrative  . Not on file   Social Determinants of Health   Financial Resource Strain: Low Risk   . Difficulty of Paying Living Expenses: Not hard at all  Food Insecurity: No Food Insecurity  . Worried About Charity fundraiser in the Last Year: Never true  . Ran Out of Food in the Last Year: Never true  Transportation Needs: No Transportation Needs  . Lack of Transportation (Medical): No  . Lack of Transportation (Non-Medical): No  Physical Activity: Inactive  . Days of Exercise per Week: 0 days  . Minutes of Exercise per Session: 0 min  Stress: No Stress Concern Present  . Feeling of Stress : Not at all  Social Connections: Socially Integrated  . Frequency of Communication with Friends and Family: More than three times a week  . Frequency of Social Gatherings with Friends and Family:  Twice a week  . Attends Religious Services: More than 4 times per year  . Active Member of Clubs or Organizations: Yes  . Attends Archivist Meetings: More than 4 times per year  . Marital Status: Married  Human resources officer Violence: Not At Risk  . Fear of Current or Ex-Partner: No  . Emotionally Abused: No  . Physically Abused: No  . Sexually Abused: No    Outpatient Medications Prior to Visit  Medication Sig Dispense Refill  . ACCU-CHEK FASTCLIX LANCETS MISC Use up to three times daily Dx e11.22 300 each 1  . ACCU-CHEK SMARTVIEW test strip USE AS INSTRUCTED 3 TIMES  DAILY 300 strip 3  . acetaminophen (TYLENOL) 500 MG tablet Take 500 mg by mouth as needed.    Marland Kitchen aspirin 81 MG tablet Take 81 mg by mouth daily.    Marland Kitchen atorvastatin (LIPITOR) 40 MG tablet TAKE 1 TABLET BY MOUTH  DAILY 90 tablet 3  . Blood Glucose Monitoring Suppl (ACCU-CHEK NANO SMARTVIEW) w/Device KIT TEST 1 TO 2 TIMES DAILY 1 kit 0  . glipiZIDE (GLIPIZIDE XL) 5 MG 24 hr tablet Take 1 tablet (5 mg total) by mouth daily with breakfast. 90 tablet 1  . Insulin Pen Needle (B-D ULTRAFINE III SHORT PEN) 31G X 8 MM MISC USE AS DIRECTED WITH  LEVEMIR FLEXPEN DAILY 100 each 5  . Insulin Syringe-Needle U-100 (INSULIN SYRINGE .5CC/30GX5/16") 30G X 5/16" 0.5 ML MISC Use as directed with novolog 70/30 100 each 3  . LANTUS SOLOSTAR 100 UNIT/ML Solostar Pen INJECT SUBCUTANEOUSLY 70  UNITS DAILY 75 mL 3  . metoprolol succinate (TOPROL-XL) 50 MG 24 hr tablet TAKE 1 TABLET BY MOUTH  DAILY WITH OR IMMEDIATELY  AFTER A MEAL 90 tablet 3  . nitroGLYCERIN (NITROSTAT) 0.4 MG SL tablet Place 1 tablet (0.4 mg total) under the tongue every 5 (five) minutes as needed for chest pain. 25 tablet 3  . nystatin (MYCOSTATIN/NYSTOP) powder Apply between toes and under toes every morning 45 g 2  . olmesartan (BENICAR) 20 MG tablet TAKE 1 TABLET BY MOUTH  DAILY 90 tablet 3  . sildenafil (VIAGRA) 100 MG tablet Take 1 tablet (100 mg total) by mouth daily  as needed for erectile dysfunction. 10 tablet 3  . glipiZIDE (GLUCOTROL XL) 2.5 MG 24 hr tablet Take 1 tablet (2.5 mg total) by mouth daily with breakfast. (Patient taking differently: Take 10 mg by mouth daily with breakfast.) 90 tablet 1  . triamcinolone ointment (KENALOG) 0.1 % Apply  1 application topically 2 (two) times daily. 454 g 2   No facility-administered medications prior to visit.    Allergies  Allergen Reactions  . Rosuvastatin Other (See Comments)    Adverse GI symptoms  . Codeine     Review of Systems  Constitutional: Negative.   Respiratory: Negative.   Cardiovascular: Negative.   Endocrine:       Blood sugars are generally well controlled  Skin: Positive for rash.       To left leg, right leg, and left chest  Psychiatric/Behavioral: Negative.        Objective:    Physical Exam Constitutional:      Appearance: Normal appearance.  Cardiovascular:     Rate and Rhythm: Normal rate and regular rhythm.     Pulses: Normal pulses.     Heart sounds: Normal heart sounds.  Pulmonary:     Effort: Pulmonary effort is normal.     Breath sounds: Normal breath sounds.  Skin:    Comments: Left leg has hyperpigmented scaling, right upper leg and left chest have red macular rash that itches  Neurological:     Mental Status: He is alert.  Psychiatric:        Mood and Affect: Mood normal.        Behavior: Behavior normal.        Thought Content: Thought content normal.        Judgment: Judgment normal.     BP 131/69   Pulse 67   Temp 98 F (36.7 C)   Resp 18   Ht '5\' 9"'  (1.753 m)   Wt 224 lb (101.6 kg)   SpO2 95%   BMI 33.08 kg/m  Wt Readings from Last 3 Encounters:  06/02/20 224 lb (101.6 kg)  04/29/20 219 lb (99.3 kg)  02/18/20 224 lb (101.6 kg)    There are no preventive care reminders to display for this patient.  There are no preventive care reminders to display for this patient.   Lab Results  Component Value Date   TSH 1.76 11/04/2018   Lab  Results  Component Value Date   WBC 5.1 01/16/2020   HGB 13.2 01/16/2020   HCT 39.7 01/16/2020   MCV 88.6 01/16/2020   PLT 228 01/16/2020   Lab Results  Component Value Date   NA 139 04/22/2020   K 4.8 04/22/2020   CO2 24 04/22/2020   GLUCOSE 121 (H) 04/22/2020   BUN 16 04/22/2020   CREATININE 1.32 (H) 04/22/2020   BILITOT 0.4 01/16/2020   ALKPHOS 112 08/11/2016   AST 19 01/16/2020   ALT 22 01/16/2020   PROT 6.6 01/16/2020   ALBUMIN 4.0 08/11/2016   CALCIUM 9.3 04/22/2020   Lab Results  Component Value Date   CHOL 131 01/16/2020   Lab Results  Component Value Date   HDL 41 01/16/2020   Lab Results  Component Value Date   LDLCALC 70 01/16/2020   Lab Results  Component Value Date   TRIG 123 01/16/2020   Lab Results  Component Value Date   CHOLHDL 3.2 01/16/2020   Lab Results  Component Value Date   HGBA1C 8.4 (H) 04/22/2020       Assessment & Plan:   Problem List Items Addressed This Visit      Cardiovascular and Mediastinum   Essential hypertension   Relevant Orders   CBC with Differential/Platelet   CMP14+EGFR   Lipid Panel With LDL/HDL Ratio   ASCVD (arteriosclerotic cardiovascular disease)   Relevant  Orders   Lipid Panel With LDL/HDL Ratio   Type 2 diabetes mellitus with vascular disease ([place]) - Primary   Relevant Medications   glipiZIDE (GLUCOTROL) 10 MG tablet   Other Relevant Orders   CBC with Differential/Platelet   CMP14+EGFR   Hemoglobin A1c   Lipid Panel With LDL/HDL Ratio   Microalbumin / creatinine urine ratio     Musculoskeletal and Integument   Rash    -was referred to derm previously, but he states they didn't have an appointment for about 6 months -Rx. Triamcinolone cream -sent referral to Dr. Tarri Glenn -has 2 different types of rashes       Relevant Medications   triamcinolone ointment (KENALOG) 0.1 %   Other Relevant Orders   Ambulatory referral to Dermatology    Other Visit Diagnoses    Eczema, unspecified type        Relevant Medications   triamcinolone ointment (KENALOG) 0.1 %       Meds ordered this encounter  Medications  . glipiZIDE (GLUCOTROL) 10 MG tablet    Sig: Take 1 tablet (10 mg total) by mouth 2 (two) times daily before a meal.    Dispense:  60 tablet    Refill:  3  . triamcinolone ointment (KENALOG) 0.1 %    Sig: Apply 1 application topically 2 (two) times daily.    Dispense:  454 g    Refill:  2     Noreene Larsson, NP

## 2020-06-02 NOTE — Patient Instructions (Addendum)
I sent in triamcinolone for your rash.  Please have fasting labs drawn 2-3 days prior to your next appointment.

## 2020-06-02 NOTE — Assessment & Plan Note (Signed)
-  was referred to derm previously, but he states they didn't have an appointment for about 6 months -Rx. Triamcinolone cream -sent referral to Dr. Tarri Glenn -has 2 different types of rashes

## 2020-06-02 NOTE — Addendum Note (Signed)
Addended by: Lonn Georgia on: 06/02/2020 01:26 PM   Modules accepted: Orders

## 2020-06-09 ENCOUNTER — Other Ambulatory Visit: Payer: Self-pay | Admitting: Family Medicine

## 2020-06-10 ENCOUNTER — Other Ambulatory Visit: Payer: Self-pay | Admitting: Family Medicine

## 2020-06-11 ENCOUNTER — Other Ambulatory Visit: Payer: Self-pay | Admitting: Family Medicine

## 2020-06-16 ENCOUNTER — Other Ambulatory Visit: Payer: Self-pay | Admitting: Family Medicine

## 2020-06-18 DIAGNOSIS — E119 Type 2 diabetes mellitus without complications: Secondary | ICD-10-CM | POA: Diagnosis not present

## 2020-06-23 LAB — HM DIABETES EYE EXAM

## 2020-06-27 ENCOUNTER — Other Ambulatory Visit: Payer: Self-pay | Admitting: Family Medicine

## 2020-08-04 ENCOUNTER — Other Ambulatory Visit: Payer: Self-pay

## 2020-08-04 ENCOUNTER — Encounter: Payer: Self-pay | Admitting: Podiatry

## 2020-08-04 ENCOUNTER — Ambulatory Visit: Payer: Medicare Other | Admitting: Family Medicine

## 2020-08-04 ENCOUNTER — Ambulatory Visit: Payer: Medicare Other | Admitting: Podiatry

## 2020-08-04 DIAGNOSIS — B351 Tinea unguium: Secondary | ICD-10-CM

## 2020-08-04 DIAGNOSIS — M79675 Pain in left toe(s): Secondary | ICD-10-CM | POA: Diagnosis not present

## 2020-08-04 DIAGNOSIS — M79674 Pain in right toe(s): Secondary | ICD-10-CM | POA: Diagnosis not present

## 2020-08-05 DIAGNOSIS — E1159 Type 2 diabetes mellitus with other circulatory complications: Secondary | ICD-10-CM | POA: Diagnosis not present

## 2020-08-05 DIAGNOSIS — I2510 Atherosclerotic heart disease of native coronary artery without angina pectoris: Secondary | ICD-10-CM | POA: Diagnosis not present

## 2020-08-05 DIAGNOSIS — I10 Essential (primary) hypertension: Secondary | ICD-10-CM | POA: Diagnosis not present

## 2020-08-06 NOTE — Progress Notes (Signed)
Glucose/A1c is a little elevated and mild increase in Alk phos. Looks like you see them on 5/11.

## 2020-08-07 LAB — CBC WITH DIFFERENTIAL/PLATELET
Basophils Absolute: 0 10*3/uL (ref 0.0–0.2)
Basos: 1 %
EOS (ABSOLUTE): 0.3 10*3/uL (ref 0.0–0.4)
Eos: 5 %
Hematocrit: 40.5 % (ref 37.5–51.0)
Hemoglobin: 13.6 g/dL (ref 13.0–17.7)
Immature Grans (Abs): 0 10*3/uL (ref 0.0–0.1)
Immature Granulocytes: 0 %
Lymphocytes Absolute: 1.5 10*3/uL (ref 0.7–3.1)
Lymphs: 25 %
MCH: 29.2 pg (ref 26.6–33.0)
MCHC: 33.6 g/dL (ref 31.5–35.7)
MCV: 87 fL (ref 79–97)
Monocytes Absolute: 0.6 10*3/uL (ref 0.1–0.9)
Monocytes: 9 %
Neutrophils Absolute: 3.7 10*3/uL (ref 1.4–7.0)
Neutrophils: 60 %
Platelets: 224 10*3/uL (ref 150–450)
RBC: 4.66 x10E6/uL (ref 4.14–5.80)
RDW: 13.4 % (ref 11.6–15.4)
WBC: 6.2 10*3/uL (ref 3.4–10.8)

## 2020-08-07 LAB — CMP14+EGFR
ALT: 30 IU/L (ref 0–44)
AST: 20 IU/L (ref 0–40)
Albumin/Globulin Ratio: 1.5 (ref 1.2–2.2)
Albumin: 4.3 g/dL (ref 3.8–4.8)
Alkaline Phosphatase: 129 IU/L — ABNORMAL HIGH (ref 44–121)
BUN/Creatinine Ratio: 11 (ref 10–24)
BUN: 14 mg/dL (ref 8–27)
Bilirubin Total: 0.5 mg/dL (ref 0.0–1.2)
CO2: 21 mmol/L (ref 20–29)
Calcium: 9.3 mg/dL (ref 8.6–10.2)
Chloride: 103 mmol/L (ref 96–106)
Creatinine, Ser: 1.26 mg/dL (ref 0.76–1.27)
Globulin, Total: 2.8 g/dL (ref 1.5–4.5)
Glucose: 107 mg/dL — ABNORMAL HIGH (ref 65–99)
Potassium: 4.5 mmol/L (ref 3.5–5.2)
Sodium: 140 mmol/L (ref 134–144)
Total Protein: 7.1 g/dL (ref 6.0–8.5)
eGFR: 62 mL/min/{1.73_m2} (ref >59)

## 2020-08-07 LAB — LIPID PANEL WITH LDL/HDL RATIO
Cholesterol, Total: 120 mg/dL (ref 100–199)
HDL: 46 mg/dL (ref >39)
LDL Chol Calc (NIH): 55 mg/dL (ref 0–99)
LDL/HDL Ratio: 1.2 ratio (ref 0.0–3.6)
Triglycerides: 99 mg/dL (ref 0–149)
VLDL Cholesterol Cal: 19 mg/dL (ref 5–40)

## 2020-08-07 LAB — HEMOGLOBIN A1C
Est. average glucose Bld gHb Est-mCnc: 177 mg/dL
Hgb A1c MFr Bld: 7.8 % — ABNORMAL HIGH (ref 4.8–5.6)

## 2020-08-07 LAB — MICROALBUMIN / CREATININE URINE RATIO
Creatinine, Urine: 205.6 mg/dL
Microalb/Creat Ratio: 55 mg/g creat — ABNORMAL HIGH (ref 0–29)
Microalbumin, Urine: 113.8 ug/mL

## 2020-08-09 ENCOUNTER — Other Ambulatory Visit: Payer: Self-pay

## 2020-08-09 ENCOUNTER — Ambulatory Visit (INDEPENDENT_AMBULATORY_CARE_PROVIDER_SITE_OTHER): Payer: Medicare Other

## 2020-08-09 DIAGNOSIS — E1151 Type 2 diabetes mellitus with diabetic peripheral angiopathy without gangrene: Secondary | ICD-10-CM

## 2020-08-09 NOTE — Progress Notes (Signed)
Patient in office today for diabetic shoes. Shoe size is 11 mens, width is medium wide. Doctor treating diabetes is Dr. Tula Nakayama at the [facility] office. Patient selected [phone] (apex athletic lace walkers) as a 1st option shoe and [id number] ([employer], apex bolt athletic knit) shoe as the 2nd option for shoes. Patient advised that the office will call to schedule an appointment when the shoes are ready for pick-up. Patient verbalized understanding.

## 2020-08-09 NOTE — Progress Notes (Signed)
A1c was a little elevated at 7.8, and we will discuss medication modification at the appointment on 5/11. Alkaline phosphatase was slightly elevated. This is a non-specific test, but have you had any abdominal pain recently.

## 2020-08-10 NOTE — Progress Notes (Signed)
  Subjective:  Patient ID: Robert [name], male    DOB: [birth year],  MRN: [id number]  70 y.o. male presents preventative diabetic foot care and painful thick toenails that are difficult to trim. Pain interferes with ambulation. Aggravating factors include wearing enclosed shoe gear. Pain is relieved with periodic professional debridement.   He states he will be seeing a specialist for the eczematous patches on his thigh.  He states his blood glucose was 91 mg/dl this morning.   He voices no new pedal concerns on today's visit.  Allergies  Allergen Reactions  . Rosuvastatin Other (See Comments)    Adverse GI symptoms  . Codeine     Review of Systems: Negative except as noted in the HPI.   Objective:   Constitutional Pt is a pleasant 70 y.o. African American male WD, WN in NAD. AAO x 3.   Vascular Capillary refill time to digits immediate b/l. Faintly palpable DP pulse(s) b/l lower extremities. Nonpalpable PT pulse(s) b/l lower extremities. Pedal hair present. Lower extremity skin temperature gradient within normal limits. No pain with calf compression b/l. No edema noted b/l lower extremities.  Neurologic Protective sensation intact 5/5 intact bilaterally with 10g monofilament b/l.  Dermatologic Pedal skin with normal turgor, texture and tone bilaterally. No open wounds bilaterally. No interdigital macerations bilaterally. Toenails 1-5 b/l elongated, discolored, dystrophic, thickened, crumbly with subungual debris and tenderness to dorsal palpation. No hyperkeratotic nor porokeratotic lesions present on today's visit.  Orthopedic: Normal muscle strength 5/5 to all lower extremity muscle groups bilaterally. No pain crepitus or joint limitation noted with ROM b/l. No gross bony deformities bilaterally.   Radiographs: None Assessment:   1. Pain due to onychomycosis of toenails of both feet     Plan:  Patient was evaluated and treated and all questions answered.  Onychomycosis with  pain -Nails palliatively debridement as below. -Educated on self-care  Procedure: Nail Debridement Rationale: Pain Type of Debridement: manual, sharp debridement. Instrumentation: Nail nipper, rotary burr. Number of Nails: 10  -Examined patient. -No new findings. No new orders. -Continue diabetic foot care principles. -Patient to continue soft, supportive shoe gear daily. -Toenails 1-5 b/l were debrided in length and girth with sterile nail nippers and dremel without iatrogenic bleeding.  -Patient to report any pedal injuries to medical professional immediately. -Patient/POA to call should there be question/concern in the interim.  Return in about 3 months (around 11/04/2020).  Marzetta Board, DPM

## 2020-08-11 ENCOUNTER — Other Ambulatory Visit: Payer: Self-pay

## 2020-08-11 ENCOUNTER — Encounter: Payer: Self-pay | Admitting: Family Medicine

## 2020-08-11 ENCOUNTER — Ambulatory Visit (INDEPENDENT_AMBULATORY_CARE_PROVIDER_SITE_OTHER): Payer: Medicare Other | Admitting: Family Medicine

## 2020-08-11 VITALS — BP 138/78 | HR 65 | Resp 16 | Ht 69.0 in | Wt 226.0 lb

## 2020-08-11 DIAGNOSIS — I10 Essential (primary) hypertension: Secondary | ICD-10-CM | POA: Diagnosis not present

## 2020-08-11 DIAGNOSIS — E785 Hyperlipidemia, unspecified: Secondary | ICD-10-CM | POA: Diagnosis not present

## 2020-08-11 DIAGNOSIS — I2510 Atherosclerotic heart disease of native coronary artery without angina pectoris: Secondary | ICD-10-CM

## 2020-08-11 DIAGNOSIS — E669 Obesity, unspecified: Secondary | ICD-10-CM

## 2020-08-11 DIAGNOSIS — E1159 Type 2 diabetes mellitus with other circulatory complications: Secondary | ICD-10-CM

## 2020-08-11 MED ORDER — SILDENAFIL CITRATE 100 MG PO TABS: 100.0000 mg | ORAL_TABLET | Freq: Every day | ORAL | 3 refills | Status: DC | PRN

## 2020-08-11 NOTE — Assessment & Plan Note (Signed)
Controlled, no change in medication DASH diet and commitment to daily physical activity for a minimum of 30 minutes discussed and encouraged, as a part of hypertension management. The importance of attaining a healthy weight is also discussed.  BP/Weight 08/11/2020 06/02/2020 04/29/2020 02/18/2020 01/21/2020 3/47/4259 08/06/3873  Systolic BP 643 329 518 841 660 - 630  Diastolic BP 78 69 60 78 72 - 74  Wt. (Lbs) 226 224 219 224 223 217 220  BMI 33.37 33.08 32.34 33.08 32.93 32.05 32.49

## 2020-08-11 NOTE — Assessment & Plan Note (Addendum)
Improved, increase lantus to 60 units daily Robert [name] is reminded of the importance of commitment to daily physical activity for 30 minutes or more, as able and the need to limit carbohydrate intake to 30 to 60 grams per meal to help with blood sugar control.   The need to take medication as prescribed, test blood sugar as directed, and to call between visits if there is a concern that blood sugar is uncontrolled is also discussed.   Robert [name] is reminded of the importance of daily foot exam, annual eye examination, and good blood sugar, blood pressure and cholesterol control.  Diabetic Labs Latest Ref Rng & Units 08/05/2020 04/22/2020 01/16/2020 08/29/2019 05/15/2019  HbA1c 4.8 - 5.6 % 7.8(H) 8.4(H) 7.9(H) 6.5(H) 7.1(H)  Microalbumin Not Estab. ug/mL - - - - -  Micro/Creat Ratio 0 - 29 mg/g creat 55(H) - - - -  Chol 100 - 199 mg/dL 120 - 131 - 117  HDL >39 mg/dL 46 - 41 - 38(L)  Calc LDL 0 - 99 mg/dL 55 - 70 - 63  Triglycerides 0 - 149 mg/dL 99 - 123 - 84  Creatinine 0.76 - 1.27 mg/dL 1.26 1.32(H) 1.29(H) 1.28(H) 1.25   BP/Weight 08/11/2020 06/02/2020 04/29/2020 02/18/2020 01/21/2020 0/86/7619 5/0/9326  Systolic BP 712 458 099 833 825 - 053  Diastolic BP 78 69 60 78 72 - 74  Wt. (Lbs) 226 224 219 224 223 217 220  BMI 33.37 33.08 32.34 33.08 32.93 32.05 32.49   Foot/eye exam completion dates Latest Ref Rng & Units 06/23/2020 01/21/2020  Eye Exam No Retinopathy No Retinopathy -  Foot exam Order - - -  Foot Form Completion - - Done

## 2020-08-11 NOTE — Assessment & Plan Note (Signed)
Denies angina or chest pain

## 2020-08-11 NOTE — Assessment & Plan Note (Signed)
Hyperlipidemia:Low fat diet discussed and encouraged.   Lipid Panel  Lab Results  Component Value Date   CHOL 120 08/05/2020   HDL 46 08/05/2020   LDLCALC 55 08/05/2020   TRIG 99 08/05/2020   CHOLHDL 3.2 01/16/2020     Controlled, no change in medication

## 2020-08-11 NOTE — Progress Notes (Signed)
[name]     MRN: [id number]      DOB: 1950/04/07   HPI [name] is here for follow up and re-evaluation of chronic medical conditions, medication management and review of any available recent lab and radiology data.  Preventive health is updated, specifically  Cancer screening and Immunization.   Questions or concerns regarding consultations or procedures which the PT has had in the interim are  addressed. The PT denies any adverse reactions to current medications since the last visit.  Has upcoming dermatology appt re rash, does have some response to topical steroid Blood sugar fluctuating with diet  No egular exercise   ROS Denies recent fever or chills. Denies sinus pressure, nasal congestion, ear pain or sore throat. Denies chest congestion, productive cough or wheezing. Denies chest pains, palpitations and leg swelling Denies abdominal pain, nausea, vomiting,diarrhea or constipation.   Denies dysuria, frequency, hesitancy or incontinence. Denies joint pain, swelling and limitation in mobility. Denies headaches, seizures, numbness, or tingling. Denies depression, anxiety or insomnia. Marland Kitchen   PE  BP 138/78   Pulse 65   Resp 16   Ht 5\' 9"  (1.753 m)   Wt 226 lb (102.5 kg)   SpO2 96%   BMI 33.37 kg/m   Patient alert and oriented and in no cardiopulmonary distress.  HEENT: No facial asymmetry, EOMI,     Neck supple .  Chest: Clear to auscultation bilaterally.  CVS: S1, S2 no murmurs, no S3.Regular rate.  ABD: Soft non tender.   Ext: No edema    Skin: Intact, erythematous macular rash on leg  Psych: Good eye contact, normal affect. Memory intact not anxious or depressed appearing.  CNS: CN 2-12 intact, power,  normal throughout.no focal deficits noted.   Assessment & Plan  Essential hypertension Controlled, no change in medication DASH diet and commitment to daily physical activity for a minimum of 30 minutes discussed and encouraged, as a part of  hypertension management. The importance of attaining a healthy weight is also discussed.  BP/Weight 08/11/2020 06/02/2020 04/29/2020 02/18/2020 01/21/2020 1/75/1025 11/05/2776  Systolic BP 242 353 614 431 540 - 086  Diastolic BP 78 69 60 78 72 - 74  Wt. (Lbs) 226 224 219 224 223 217 220  BMI 33.37 33.08 32.34 33.08 32.93 32.05 32.49       ASCVD (arteriosclerotic cardiovascular disease) Denies angina or chest pain  Type 2 diabetes mellitus with vascular disease (HCC) Improved, increase lantus to 60 units daily Mr. Sawa is reminded of the importance of commitment to daily physical activity for 30 minutes or more, as able and the need to limit carbohydrate intake to 30 to 60 grams per meal to help with blood sugar control.   The need to take medication as prescribed, test blood sugar as directed, and to call between visits if there is a concern that blood sugar is uncontrolled is also discussed.   Mr. Pasquarello is reminded of the importance of daily foot exam, annual eye examination, and good blood sugar, blood pressure and cholesterol control.  Diabetic Labs Latest Ref Rng & Units 08/05/2020 04/22/2020 01/16/2020 08/29/2019 05/15/2019  HbA1c 4.8 - 5.6 % 7.8(H) 8.4(H) 7.9(H) 6.5(H) 7.1(H)  Microalbumin Not Estab. ug/mL - - - - -  Micro/Creat Ratio 0 - 29 mg/g creat 55(H) - - - -  Chol 100 - 199 mg/dL 120 - 131 - 117  HDL >39 mg/dL 46 - 41 - 38(L)  Calc LDL 0 - 99 mg/dL 55 -  70 - 63  Triglycerides 0 - 149 mg/dL 99 - 123 - 84  Creatinine 0.76 - 1.27 mg/dL 1.26 1.32(H) 1.29(H) 1.28(H) 1.25   BP/Weight 08/11/2020 06/02/2020 04/29/2020 02/18/2020 01/21/2020 1/74/0814 07/10/1854  Systolic BP 314 970 263 785 885 - 027  Diastolic BP 78 69 60 78 72 - 74  Wt. (Lbs) 226 224 219 224 223 217 220  BMI 33.37 33.08 32.34 33.08 32.93 32.05 32.49   Foot/eye exam completion dates Latest Ref Rng & Units 06/23/2020 01/21/2020  Eye Exam No Retinopathy No Retinopathy -  Foot exam Order - - -  Foot Form Completion - -  Done        Hyperlipidemia LDL goal <70 Hyperlipidemia:Low fat diet discussed and encouraged.   Lipid Panel  Lab Results  Component Value Date   CHOL 120 08/05/2020   HDL 46 08/05/2020   LDLCALC 55 08/05/2020   TRIG 99 08/05/2020   CHOLHDL 3.2 01/16/2020     Controlled, no change in medication   Obesity (BMI 30.0-34.9)  Patient re-educated about  the importance of commitment to a  minimum of 150 minutes of exercise per week as able.  The importance of healthy food choices with portion control discussed, as well as eating regularly and within a 12 hour window most days. The need to choose "clean , green" food 50 to 75% of the time is discussed, as well as to make water the primary drink and set a goal of 64 ounces water daily.    Weight /BMI 08/11/2020 06/02/2020 04/29/2020  WEIGHT 226 lb 224 lb 219 lb  HEIGHT 5\' 9"  5\' 9"  5\' 9"   BMI 33.37 kg/m2 33.08 kg/m2 32.34 kg/m2

## 2020-08-11 NOTE — Patient Instructions (Addendum)
F/u early Sept, flu vaccine at visit , call if you need me before  Non fast chem 7 and EGFR and hBA1C 3 days before appointment  Please commit to exercise 30 mins 5 days/ week  INCREASE lantus to 60 units daily  Test twice daily  Goal for fasting blood sugar ranges from 80 to 120 and 2 hours after any meal or at bedtime should be between 130 to 170.  Thanks for choosing [facility], we consider it a privelige to serve you.

## 2020-08-11 NOTE — Assessment & Plan Note (Signed)
  Patient re-educated about  the importance of commitment to a  minimum of 150 minutes of exercise per week as able.  The importance of healthy food choices with portion control discussed, as well as eating regularly and within a 12 hour window most days. The need to choose "clean , green" food 50 to 75% of the time is discussed, as well as to make water the primary drink and set a goal of 64 ounces water daily.    Weight /BMI 08/11/2020 06/02/2020 04/29/2020  WEIGHT 226 lb 224 lb 219 lb  HEIGHT 5\' 9"  5\' 9"  5\' 9"   BMI 33.37 kg/m2 33.08 kg/m2 32.34 kg/m2

## 2020-09-21 ENCOUNTER — Other Ambulatory Visit: Payer: Self-pay

## 2020-09-21 ENCOUNTER — Ambulatory Visit: Payer: Medicare Other | Admitting: Cardiology

## 2020-09-21 ENCOUNTER — Encounter: Payer: Self-pay | Admitting: Cardiology

## 2020-09-21 VITALS — BP 154/87 | HR 72 | Ht 69.0 in | Wt 228.0 lb

## 2020-09-21 DIAGNOSIS — I10 Essential (primary) hypertension: Secondary | ICD-10-CM | POA: Diagnosis not present

## 2020-09-21 DIAGNOSIS — I2510 Atherosclerotic heart disease of native coronary artery without angina pectoris: Secondary | ICD-10-CM | POA: Diagnosis not present

## 2020-09-21 DIAGNOSIS — E782 Mixed hyperlipidemia: Secondary | ICD-10-CM | POA: Diagnosis not present

## 2020-09-21 NOTE — Progress Notes (Signed)
Clinical Summary Robert [name] is a 69 y.o.maleseen today for follow up of the following medical problems.   1. CAD - history of prior stenting as described below -08/2014 exercise nuclear stress with Duke treadmill score of 7 consistent with low risk, small area of inferolateral ischemia. Mild inferior ischemia has been noted previously in 12/2009 stress, from notes he has history of RCA CTO that was not able to be opened during 2003 cath in [place].       - no recent chest pain, no SOB/DOE - compliant with meds     2. HTN - compliant with meds    3. Hyperlipidemia  - 08/2020 TC 120 TG 99 HDL 46 LDL 55   4. DM II - followed by Dr Moshe Cipro     5. OSA -  mixed compliance with cpap   6. AAA screen - no aneurysm by [place] 08/2010          Past Medical History:  Diagnosis Date   Arteriosclerotic cardiovascular disease (ASCVD)    DES to LAD D1 - 2003 in [place]; failed intervention for a totally obstructed RCA at that time; EF of 45%; 12/2009: Equivocal stress nuclear with good exercise tolerance, negative stress EKG, normal EF with mild mid and distal inferior ischemia   Coronary artery disease    Diabetes mellitus without complication ([place])    [employer] 04/26/2020   Diabetes mellitus, insulin dependent (IDDM), controlled 10/25/2012   HBA1C is 6.9 on 10/22/2012    Diabetes mellitus, type II ([place])    Hyperlipidemia    Lipid profile in 09/2011:128, 130, 33, 69   Hypertension    Lab  09/2011: Normal CMet ex G-133   Tobacco abuse, in remission    30 pack years; quit in 1985     Allergies  Allergen Reactions   Rosuvastatin Other (See Comments)    Adverse GI symptoms   Codeine      Current Outpatient Medications  Medication Sig Dispense Refill   ACCU-CHEK FASTCLIX LANCETS MISC Use up to three times daily Dx e11.22 300 each 1   ACCU-CHEK SMARTVIEW test strip USE AS INSTRUCTED 3 TIMES  DAILY 300 strip 3   acetaminophen (TYLENOL) 500 MG tablet Take 500 mg by mouth as  needed.     aspirin 81 MG tablet Take 81 mg by mouth daily.     atorvastatin (LIPITOR) 40 MG tablet TAKE 1 TABLET BY MOUTH  DAILY 90 tablet 3   Blood Glucose Monitoring Suppl (ACCU-CHEK NANO SMARTVIEW) w/Device KIT TEST 1 TO 2 TIMES DAILY 1 kit 0   glipiZIDE (GLUCOTROL) 10 MG tablet Take 1 tablet (10 mg total) by mouth daily before breakfast. 90 tablet 3   Insulin Pen Needle (B-D ULTRAFINE III SHORT PEN) 31G X 8 MM MISC USE AS DIRECTED WITH  LEVEMIR FLEXPEN DAILY 100 each 5   Insulin Syringe-Needle U-100 (INSULIN SYRINGE .5CC/30GX5/16") 30G X 5/16" 0.5 ML MISC Use as directed with novolog 70/30 100 each 3   LANTUS SOLOSTAR 100 UNIT/ML Solostar Pen INJECT SUBCUTANEOUSLY 70  UNITS DAILY 75 mL 3   metoprolol succinate (TOPROL-XL) 50 MG 24 hr tablet TAKE 1 TABLET BY MOUTH  DAILY WITH OR IMMEDIATELY  AFTER A MEAL 90 tablet 3   nitroGLYCERIN (NITROSTAT) 0.4 MG SL tablet Place 1 tablet (0.4 mg total) under the tongue every 5 (five) minutes as needed for chest pain. 25 tablet 3   nystatin (MYCOSTATIN/NYSTOP) powder Apply between toes and under toes every morning 45  g 2   olmesartan (BENICAR) 20 MG tablet TAKE 1 TABLET BY MOUTH  DAILY 90 tablet 3   sildenafil (VIAGRA) 100 MG tablet Take 1 tablet (100 mg total) by mouth daily as needed for erectile dysfunction. 10 tablet 3   triamcinolone ointment (KENALOG) 0.1 % Apply 1 application topically 2 (two) times daily. 454 g 2   No current facility-administered medications for this visit.     Past Surgical History:  Procedure Laterality Date   CARDIAC CATHETERIZATION     COLONOSCOPY  2010   Negative screening study   COLONOSCOPY N/A 11/11/2014   Procedure: COLONOSCOPY;  Surgeon: Rogene Houston, MD;  Location: [facility];  Service: Endoscopy;  Laterality: N/A;  [place] ARTHROSCOPY W/ MENISCECTOMY  11/2008   Right   PRESSURE ULCER DEBRIDEMENT  2006   Right lower extremity   ROTATOR CUFF REPAIR  07/2009   Right      Allergies  Allergen Reactions   Rosuvastatin Other (See Comments)    Adverse GI symptoms   Codeine       Family History  Problem Relation Age of Onset   Diabetes Sister    Arthritis Brother    Diabetes Brother    Arthritis Mother    Cancer Father        Hypernephroma     Social History Robert [name] reports that he quit smoking about 37 years ago. His smoking use included cigarettes. He started smoking about 54 years ago. He has a 30.00 pack-year smoking history. He has never used smokeless tobacco. Robert [name] reports no history of alcohol use.   Review of Systems CONSTITUTIONAL: No weight loss, fever, chills, weakness or fatigue.  HEENT: Eyes: No visual loss, blurred vision, double vision or yellow sclerae.No hearing loss, sneezing, congestion, runny nose or sore throat.  SKIN: No rash or itching.  CARDIOVASCULAR: per hpi RESPIRATORY: No shortness of breath, cough or sputum.  GASTROINTESTINAL: No anorexia, nausea, vomiting or diarrhea. No abdominal pain or blood.  GENITOURINARY: No burning on urination, no polyuria NEUROLOGICAL: No headache, dizziness, syncope, paralysis, ataxia, numbness or tingling in the extremities. No change in bowel or bladder control.  MUSCULOSKELETAL: No muscle, back pain, joint pain or stiffness.  LYMPHATICS: No enlarged nodes. No history of splenectomy.  PSYCHIATRIC: No history of depression or anxiety.  ENDOCRINOLOGIC: No reports of sweating, cold or heat intolerance. No polyuria or polydipsia.  Marland Kitchen   Physical Examination Today's Vitals   09/21/20 1259  BP: (!) 154/87  Pulse: 72  SpO2: 96%  Weight: 228 lb (103.4 kg)  Height: _0  (1.753 m)   Body mass index is 33.67 kg/m.  Gen: resting comfortably, no acute distress HEENT: no scleral icterus, pupils equal round and reactive, no palptable cervical adenopathy,  CV: RRR, no m/r/g, no jvd Resp: Clear to auscultation bilaterally GI: abdomen is soft, non-tender, non-distended, normal  bowel sounds, no hepatosplenomegaly MSK: extremities are warm, no edema.  Skin: warm, no rash Neuro:  no focal deficits Psych: appropriate affect   Diagnostic Studies  08/2014 Exercise Nuclear stress Low risk Duke treadmill score of 7. Blood pressure demonstrated a hypertensive response to exercise. There was no ST segment deviation noted during stress. There is a small defect of mild severity present in the basal inferolateral, mid inferolateral and apical inferior location. The defect is reversible. Consistent with mild, predominantly mid to basal inferolateral ischemia. Myocardial perfusion is abnormal. This is a low risk study.  Assessment and Plan  1. CAD -no symptoms, continue current meds   2. HTN - manual recheck 134/72, reasonable control. Continue to monitor, discussed sodium restrciction and weight loss. Room to titrate olmesartan if needed    3. Hyperlipidemia -at goal, continue statin   F/u 6 months    Arnoldo Lenis, M.D.

## 2020-09-21 NOTE — Patient Instructions (Addendum)

## 2020-10-04 ENCOUNTER — Other Ambulatory Visit: Payer: Self-pay | Admitting: Family Medicine

## 2020-11-02 ENCOUNTER — Telehealth: Payer: Self-pay | Admitting: Podiatry

## 2020-11-02 NOTE — Telephone Encounter (Signed)
Called pt to let him know the diabetic shoes were in and he could pick them up at his appt on 8.10  but pt does not have voicemail set up.

## 2020-11-10 ENCOUNTER — Encounter: Payer: Self-pay | Admitting: Podiatry

## 2020-11-10 ENCOUNTER — Other Ambulatory Visit: Payer: Self-pay | Admitting: *Deleted

## 2020-11-10 ENCOUNTER — Ambulatory Visit: Payer: Medicare Other | Admitting: Podiatry

## 2020-11-10 ENCOUNTER — Other Ambulatory Visit: Payer: Self-pay

## 2020-11-10 DIAGNOSIS — I739 Peripheral vascular disease, unspecified: Secondary | ICD-10-CM | POA: Diagnosis not present

## 2020-11-10 DIAGNOSIS — M79674 Pain in right toe(s): Secondary | ICD-10-CM | POA: Diagnosis not present

## 2020-11-10 DIAGNOSIS — L84 Corns and callosities: Secondary | ICD-10-CM | POA: Diagnosis not present

## 2020-11-10 DIAGNOSIS — B351 Tinea unguium: Secondary | ICD-10-CM

## 2020-11-10 DIAGNOSIS — E1151 Type 2 diabetes mellitus with diabetic peripheral angiopathy without gangrene: Secondary | ICD-10-CM

## 2020-11-10 DIAGNOSIS — M79675 Pain in left toe(s): Secondary | ICD-10-CM | POA: Diagnosis not present

## 2020-11-14 NOTE — Progress Notes (Signed)
Subjective: Robert [name] is a pleasant 70 y.o. male patient seen today painful thick toenails that are difficult to trim. Pain interferes with ambulation. Aggravating factors include wearing enclosed shoe gear. Pain is relieved with periodic professional debridement.  He is also here to pick up his diabetic shoes on today. His blood glucose was 130 mg/dl this morning.  PCP is Fayrene Helper, MD. Last visit was: 08/11/2020.  Allergies  Allergen Reactions   Rosuvastatin Other (See Comments)    Adverse GI symptoms   Codeine     Objective: Physical Exam  General: Robert [name] is a pleasant 70 y.o. African American male, WD, WN in NAD. AAO x 3.   Vascular:  Capillary refill time to digits immediate b/l. Faintly palpable DP pulse(s) b/l lower extremities. Nonpalpable PT pulse(s) b/l lower extremities. Pedal hair present. Lower extremity skin temperature gradient within normal limits. No pain with calf compression b/l. No edema noted b/l lower extremities.  Dermatological:  Pedal skin with normal turgor, texture and tone b/l lower extremities. No open wounds b/l lower extremities. No interdigital macerations b/l lower extremities. Toenails 1-5 b/l elongated, discolored, dystrophic, thickened, crumbly with subungual debris and tenderness to dorsal palpation. No hyperkeratotic nor porokeratotic lesions present on today's visit.  Musculoskeletal:  Normal muscle strength 5/5 to all lower extremity muscle groups bilaterally. No pain crepitus or joint limitation noted with ROM b/l lower extremities. No gross bony deformities b/l lower extremities.  Neurological:  Protective sensation intact 5/5 intact bilaterally with 10g monofilament b/l.  Assessment and Plan:  1. Pain due to onychomycosis of toenails of both feet   2. Diabetes mellitus type 2 with peripheral artery disease ([place])   -Examined patient. -Continue diabetic foot care principles: inspect feet daily, monitor glucose as  recommended by PCP and/or Endocrinologist, and follow prescribed diet per PCP, Endocrinologist and/or dietician. -Dispensed one pair diabetic shoes and 3 pair total contact insoles. Shoes were appropriate fit with no heel slippage. Reviewed warranty information and patient signed all paperwork stating patient received shoes, insert(s)/filler(s), break-in instructions and warranty information. Patient instructed not to wear shoes outside unless completely satisfied. Patient related understanding.  -Toenails 1-5 b/l were debrided in length and girth with sterile nail nippers and dremel without iatrogenic bleeding.  -Patient to report any pedal injuries to medical professional immediately. -Patient/POA to call should there be question/concern in the interim.  Return in about 3 months (around 02/10/2021).  Marzetta Board, DPM

## 2020-11-30 DIAGNOSIS — Z20822 Contact with and (suspected) exposure to covid-19: Secondary | ICD-10-CM | POA: Diagnosis not present

## 2020-12-03 ENCOUNTER — Ambulatory Visit (INDEPENDENT_AMBULATORY_CARE_PROVIDER_SITE_OTHER): Payer: Medicare Other

## 2020-12-03 ENCOUNTER — Other Ambulatory Visit: Payer: Self-pay

## 2020-12-03 DIAGNOSIS — Z0000 Encounter for general adult medical examination without abnormal findings: Secondary | ICD-10-CM

## 2020-12-03 NOTE — Progress Notes (Signed)
Subjective:   Robert [name] is a 70 y.o. male who presents for Medicare Annual/Subsequent preventive examination.I connected with  DAWSYN RAMSARAN on 12/03/20 by a audio enabled telemedicine application and verified that I am speaking with the correct person using two identifiers.   I discussed the limitations of evaluation and management by telemedicine. The patient expressed understanding and agreed to proceed.   Location of patient:Home   Location of Provider:Office  Persons participating in virtual visit: Robert [name] (patient) and Edgar Frisk, [place]  Review of Systems    Defer to PCP       Objective:    There were no vitals filed for this visit. There is no height or weight on file to calculate BMI.  Advanced Directives 12/03/2020 11/12/2017 02/01/2016 11/11/2014  Does Patient Have a Medical Advance Directive? No Yes No No  Does patient want to make changes to medical advance directive? - Yes (ED - Information included in AVS) - -  Would patient like information on creating a medical advance directive? - - - Yes - Educational materials given    Current Medications (verified) Outpatient Encounter Medications as of 12/03/2020  Medication Sig   ACCU-CHEK FASTCLIX LANCETS MISC Use up to three times daily Dx e11.22   ACCU-CHEK SMARTVIEW test strip USE AS INSTRUCTED 3 TIMES  DAILY   acetaminophen (TYLENOL) 500 MG tablet Take 500 mg by mouth as needed.   aspirin 81 MG tablet Take 81 mg by mouth daily.   atorvastatin (LIPITOR) 40 MG tablet TAKE 1 TABLET BY MOUTH  DAILY   Blood Glucose Monitoring Suppl (ACCU-CHEK NANO SMARTVIEW) w/Device KIT TEST 1 TO 2 TIMES DAILY   glipiZIDE (GLUCOTROL) 10 MG tablet Take 1 tablet (10 mg total) by mouth daily before breakfast.   Insulin Pen Needle (B-D ULTRAFINE III SHORT PEN) 31G X 8 MM MISC USE AS DIRECTED DAILY   Insulin Syringe-Needle U-100 (INSULIN SYRINGE .5CC/30GX5/16") 30G X 5/16" 0.5 ML MISC Use as directed with novolog 70/30   LANTUS SOLOSTAR  100 UNIT/ML Solostar Pen INJECT SUBCUTANEOUSLY 70  UNITS DAILY   metoprolol succinate (TOPROL-XL) 50 MG 24 hr tablet TAKE 1 TABLET BY MOUTH  DAILY WITH OR IMMEDIATELY  AFTER A MEAL   nitroGLYCERIN (NITROSTAT) 0.4 MG SL tablet Place 1 tablet (0.4 mg total) under the tongue every 5 (five) minutes as needed for chest pain.   nystatin (MYCOSTATIN/NYSTOP) powder Apply between toes and under toes every morning   olmesartan (BENICAR) 20 MG tablet TAKE 1 TABLET BY MOUTH  DAILY   sildenafil (VIAGRA) 100 MG tablet Take 1 tablet (100 mg total) by mouth daily as needed for erectile dysfunction.   triamcinolone ointment (KENALOG) 0.1 % Apply 1 application topically 2 (two) times daily.   [DISCONTINUED] PFIZER-BIONT COVID-19 VAC-TRIS SUSP injection    No facility-administered encounter medications on file as of 12/03/2020.    Allergies (verified) Rosuvastatin and Codeine   History: Past Medical History:  Diagnosis Date   Arteriosclerotic cardiovascular disease (ASCVD)    DES to LAD D1 - 2003 in [place]; failed intervention for a totally obstructed RCA at that time; EF of 45%; 12/2009: Equivocal stress nuclear with good exercise tolerance, negative stress EKG, normal EF with mild mid and distal inferior ischemia   Coronary artery disease    Diabetes mellitus without complication ([place])    Phreesia 04/26/2020   Diabetes mellitus, insulin dependent (IDDM), controlled 10/25/2012   HBA1C is 6.9 on 10/22/2012    Diabetes mellitus, type II ([place])  Hyperlipidemia    Lipid profile in 09/2011:128, 130, 33, 69   Hypertension    Lab  09/2011: Normal CMet ex G-133   Tobacco abuse, in remission    30 pack years; quit in 1985   Past Surgical History:  Procedure Laterality Date   CARDIAC CATHETERIZATION     COLONOSCOPY  2010   Negative screening study   COLONOSCOPY N/A 11/11/2014   Procedure: COLONOSCOPY;  Surgeon: Rogene Houston, MD;  [facility]: [facility];  Service: Endoscopy;  Laterality: N/A;  [place] ARTHROSCOPY W/ MENISCECTOMY  11/2008   Right   PRESSURE ULCER DEBRIDEMENT  2006   Right lower extremity   ROTATOR CUFF REPAIR  07/2009   Right   Family History  Problem Relation Age of Onset   Diabetes Sister    Arthritis Brother    Diabetes Brother    Arthritis Mother    Cancer Father        Hypernephroma   Social History   Socioeconomic History   Marital status: Married    Spouse name: Not on file   Number of children: 2   Years of education: Not on file   Highest education level: Some college, no degree  Occupational History   Occupation: Retired  Tobacco Use   Smoking status: Former    Packs/day: 1.00    Years: 30.00    Pack years: 30.00    Types: Cigarettes    Start date: 04/03/1966    Quit date: 09/02/1983    Years since quitting: 37.2   Smokeless tobacco: Never  Vaping Use   Vaping Use: Never used  Substance and Sexual Activity   Alcohol use: No    Alcohol/week: 0.0 standard drinks    Comment: Former Tax inspector -discontinued use in 2005   Drug use: No   Sexual activity: Yes  Other Topics Concern   Not on file  Social History Narrative   Not on file   Social Determinants of Health   Financial Resource Strain: Low Risk    Difficulty of Paying Living Expenses: Not hard at all  Food Insecurity: No Food Insecurity   Worried About Charity fundraiser in the Last Year: Never true   Arboriculturist in the Last Year: Never true  Transportation Needs: No Transportation Needs   Lack of Transportation (Medical): No   Lack of Transportation (Non-Medical): No  Physical Activity: Inactive   Days of Exercise per Week: 0 days   Minutes of Exercise per Session: 0 min  Stress: No Stress Concern Present   Feeling of Stress : Not at all  Social Connections: Unknown   Frequency of Communication with Friends and Family: Not on file   Frequency of Social Gatherings with Friends and Family: Twice a week   Attends Religious Services: More than  4 times per year   Active Member of [employer] or Organizations: Yes   Attends Music therapist: More than 4 times per year   Marital Status: Married    Tobacco Counseling Counseling given: Not Answered   Clinical Intake:  Pre-visit preparation completed: Yes  Pain : No/denies pain        How often do you need to have someone help you when you read instructions, pamphlets, or other written materials from your doctor or pharmacy?: 1 - Never What is the last grade level you completed in school?: COLLEGE  Diabetic?YES Nutrition Risk Assessment:  Has the  patient had any N/V/D within the last 2 months?  No  Does the patient have any non-healing wounds?  No  Has the patient had any unintentional weight loss or weight gain?  No   Diabetes:  Is the patient diabetic?  Yes  If diabetic, was a CBG obtained today?  Yes  checked at home Did the patient bring in their glucometer from home?  No  How often do you monitor your CBG's? 1-2 times daily.   Financial Strains and Diabetes Management:  Are you having any financial strains with the device, your supplies or your medication? No .  Does the patient want to be seen by Chronic Care Management for management of their diabetes?  No  Would the patient like to be referred to a Nutritionist or for Diabetic Management?  No   Diabetic Exams:  Diabetic Eye Exam: Completed 06/23/2020 Diabetic Foot Exam: Completed 01/21/2020    Interpreter Needed?: No  Information entered by :: Lucille Witts J,CMA   Activities of Daily Living In your present state of health, do you have any difficulty performing the following activities: 12/03/2020  Hearing? N  Vision? N  Difficulty concentrating or making decisions? Y  Walking or climbing stairs? N  Dressing or bathing? N  Doing errands, shopping? N  Preparing Food and eating ? N  Using the Toilet? N  In the past six months, have you accidently leaked urine? Y  Do you have problems with loss of  bowel control? N  Managing your Medications? N  Managing your Finances? N  Housekeeping or managing your Housekeeping? N  Some recent data might be hidden    Patient Care Team: Fayrene Helper, MD as PCP - General Branch, Alphonse Guild, MD as PCP - Cardiology (Cardiology)  Indicate any recent Medical Services you may have received from other than [facility] providers in the past year (date may be approximate).     Assessment:   This is a routine wellness examination for Robert [name].  Hearing/Vision screen No results found.  Dietary issues and exercise activities discussed: Current Exercise Habits: The patient does not participate in regular exercise at present   Goals Addressed   None    Depression Screen PHQ 2/9 Scores 12/03/2020 08/11/2020 06/02/2020 04/29/2020 11/25/2019 11/25/2019 11/27/2018  PHQ - 2 Score 0 2 0 1 0 0 0  PHQ- 9 Score - 4 - - - - -    Fall Risk Fall Risk  12/03/2020 08/11/2020 06/02/2020 04/29/2020 01/21/2020  Falls in the past year? 0 0 0 0 0  Number falls in past yr: 0 0 0 0 0  Injury with Fall? 0 0 0 0 0  Risk for fall due to : No Fall Risks - No Fall Risks - -  Follow up Falls evaluation completed - Falls evaluation completed - -    FALL RISK PREVENTION PERTAINING TO THE HOME:  Any stairs in or around the home? Yes  If so, are there any without handrails? No  Home free of loose throw rugs in walkways, pet beds, electrical cords, etc? Yes  Adequate lighting in your home to reduce risk of falls? Yes   ASSISTIVE DEVICES UTILIZED TO PREVENT FALLS:  Life alert? No  Use of a cane, walker or w/c? No  Grab bars in the bathroom? Yes  Shower chair or bench in shower? No  Elevated toilet seat or a handicapped toilet? Yes   TIMED UP AND GO:  Was the test performed?  N/A .  Length  of time to ambulate 10 feet: N/A sec.     Cognitive Function:     6CIT Screen 12/03/2020 11/25/2019 11/20/2018 11/12/2017  What Year? 0 points 0 points 0 points 0 points  What month? 0  points 0 points 0 points 0 points  What time? 0 points 0 points 0 points 0 points  Count back from 20 0 points 0 points 0 points -  Months in reverse 0 points 0 points 0 points -  Repeat phrase 2 points 0 points 0 points -  Total Score 2 0 0 -    Immunizations Immunization History  Administered Date(s) Administered   Fluad Quad(high Dose 65+) 01/01/2019   Influenza Split 02/14/2011, 12/18/2011   Influenza Whole 12/25/2006, 12/23/2008, 01/13/2010   Influenza,inj,Quad PF,6+ Mos 01/10/2013, 12/02/2013, 01/26/2015, 12/09/2015, 11/30/2016, 12/31/2017, 12/13/2019   PFIZER(Purple Top)SARS-COV-2 Vaccination 05/01/2019, 05/23/2019, 01/08/2020, 07/12/2020   Pneumococcal Conjugate-13 11/10/2013   Pneumococcal Polysaccharide-23 03/10/2004, 01/13/2010, 11/30/2016   Tdap 12/18/2011   Zoster Recombinat (Shingrix) 12/17/2019   Zoster, Live 12/18/2011    TDAP status: Up to date  Flu Vaccine status: Due, Education has been provided regarding the importance of this vaccine. Advised may receive this vaccine at local pharmacy or Health Dept. Aware to provide a copy of the vaccination record if obtained from local pharmacy or Health Dept. Verbalized acceptance and understanding.  Pneumococcal vaccine status: Up to date  Covid-19 vaccine status: Completed vaccines  Qualifies for Shingles Vaccine? Yes   Zostavax completed  N/A   Shingrix Completed?: No.    Education has been provided regarding the importance of this vaccine. Patient has been advised to call insurance company to determine out of pocket expense if they have not yet received this vaccine. Advised may also receive vaccine at local pharmacy or Health Dept. Verbalized acceptance and understanding.  Screening Tests Health Maintenance  Topic Date Due   Zoster Vaccines- Shingrix (2 of 2) 02/11/2020   INFLUENZA VACCINE  11/01/2020   FOOT EXAM  01/20/2021   HEMOGLOBIN A1C  02/05/2021   OPHTHALMOLOGY EXAM  06/23/2021   TETANUS/TDAP   12/17/2021   COLONOSCOPY (Pts 45-69yr Insurance coverage will need to be confirmed)  11/10/2024   COVID-19 Vaccine  Completed   Hepatitis C Screening  Completed   PNA vac Low Risk Adult  Completed   HPV VACCINES  Aged Out    Health Maintenance  Health Maintenance Due  Topic Date Due   Zoster Vaccines- Shingrix (2 of 2) 02/11/2020   INFLUENZA VACCINE  11/01/2020    Colorectal cancer screening: Type of screening: Colonoscopy. Completed 11/11/2014. Repeat every 10 years  Lung Cancer Screening: (Low Dose CT Chest recommended if Age 69-80years, 30 pack-year currently smoking OR have quit w/in 15years.) does qualify.   Lung Cancer Screening Referral: Previously placed   Additional Screening:  Hepatitis C Screening: does qualify; Completed 12/31/2014  Vision Screening: Recommended annual ophthalmology exams for early detection of glaucoma and other disorders of the eye. Is the patient up to date with their annual eye exam?  Yes  Who is the provider or what is the name of the office in which the patient attends annual eye exams? [facility] If pt is not established with a provider, would they like to be referred to a provider to establish care? No .   Dental Screening: Recommended annual dental exams for proper oral hygiene  Community Resource Referral / Chronic Care Management: CRR required this visit?  No   CCM required this visit?  No      Plan:     I have personally reviewed and noted the following in the patient's chart:   Medical and social history Use of alcohol, tobacco or illicit drugs  Current medications and supplements including opioid prescriptions. Patient is not currently taking opioid prescriptions. Functional ability and status Nutritional status Physical activity Advanced directives List of other physicians Hospitalizations, surgeries, and ER visits in previous 12 months Vitals Screenings to include cognitive, depression, and falls Referrals and  appointments  In addition, I have reviewed and discussed with patient certain preventive protocols, quality metrics, and best practice recommendations. A written personalized care plan for preventive services as well as general preventive health recommendations were provided to patient.     Edgar Frisk, [facility]   12/03/2020   Nurse Notes: Non Face to Face 30 minute visit Encounter   Robert [name] , Thank you for taking time to come for your Medicare Wellness Visit. I appreciate your ongoing commitment to your health goals. Please review the following plan we discussed and let me know if I can assist you in the future.   These are the goals we discussed:  Goals   None     This is a list of the screening recommended for you and due dates:  Health Maintenance  Topic Date Due   Zoster (Shingles) Vaccine (2 of 2) 02/11/2020   Flu Shot  11/01/2020   Complete foot exam   01/20/2021   Hemoglobin A1C  02/05/2021   Eye exam for diabetics  06/23/2021   Tetanus Vaccine  12/17/2021   Colon Cancer Screening  11/10/2024   COVID-19 Vaccine  Completed   Hepatitis C Screening: USPSTF Recommendation to screen - Ages 18-79 yo.  Completed   Pneumonia vaccines  Completed   HPV Vaccine  Aged Out   Patient advised that AVS can be viewed on MyChart

## 2020-12-03 NOTE — Patient Instructions (Signed)
Health Maintenance, Male Adopting a healthy lifestyle and getting preventive care are important in promoting health and wellness. Ask your health care provider about: The right schedule for you to have regular tests and exams. Things you can do on your own to prevent diseases and keep yourself healthy. What should I know about diet, weight, and exercise? Eat a healthy diet  Eat a diet that includes plenty of vegetables, fruits, low-fat dairy products, and lean protein. Do not eat a lot of foods that are high in solid fats, added sugars, or sodium. Maintain a healthy weight Body mass index (BMI) is a measurement that can be used to identify possible weight problems. It estimates body fat based on height and weight. Your health care provider can help determine your BMI and help you achieve or maintain a healthy weight. Get regular exercise Get regular exercise. This is one of the most important things you can do for your health. Most adults should: Exercise for at least 150 minutes each week. The exercise should increase your heart rate and make you sweat (moderate-intensity exercise). Do strengthening exercises at least twice a week. This is in addition to the moderate-intensity exercise. Spend less time sitting. Even light physical activity can be beneficial. Watch cholesterol and blood lipids Have your blood tested for lipids and cholesterol at 69 years of age, then have this test every 5 years. You may need to have your cholesterol levels checked more often if: Your lipid or cholesterol levels are high. You are older than 69 years of age. You are at high risk for heart disease. What should I know about cancer screening? Many types of cancers can be detected early and may often be prevented. Depending on your health history and family history, you may need to have cancer screening at various ages. This may include screening for: Colorectal cancer. Prostate cancer. Skin cancer. Lung  cancer. What should I know about heart disease, diabetes, and high blood pressure? Blood pressure and heart disease High blood pressure causes heart disease and increases the risk of stroke. This is more likely to develop in people who have high blood pressure readings, are of African descent, or are overweight. Talk with your health care provider about your target blood pressure readings. Have your blood pressure checked: Every 3-5 years if you are 18-39 years of age. Every year if you are 40 years old or older. If you are between the ages of 65 and 75 and are a current or former smoker, ask your health care provider if you should have a one-time screening for abdominal aortic aneurysm (AAA). Diabetes Have regular diabetes screenings. This checks your fasting blood sugar level. Have the screening done: Once every three years after age 45 if you are at a normal weight and have a low risk for diabetes. More often and at a younger age if you are overweight or have a high risk for diabetes. What should I know about preventing infection? Hepatitis B If you have a higher risk for hepatitis B, you should be screened for this virus. Talk with your health care provider to find out if you are at risk for hepatitis B infection. Hepatitis C Blood testing is recommended for: Everyone born from 1945 through 1965. Anyone with known risk factors for hepatitis C. Sexually transmitted infections (STIs) You should be screened each year for STIs, including gonorrhea and chlamydia, if: You are sexually active and are younger than 69 years of age. You are older than 69 years   of age and your health care provider tells you that you are at risk for this type of infection. Your sexual activity has changed since you were last screened, and you are at increased risk for chlamydia or gonorrhea. Ask your health care provider if you are at risk. Ask your health care provider about whether you are at high risk for HIV.  Your health care provider may recommend a prescription medicine to help prevent HIV infection. If you choose to take medicine to prevent HIV, you should first get tested for HIV. You should then be tested every 3 months for as long as you are taking the medicine. Follow these instructions at home: Lifestyle Do not use any products that contain nicotine or tobacco, such as cigarettes, e-cigarettes, and chewing tobacco. If you need help quitting, ask your health care provider. Do not use street drugs. Do not share needles. Ask your health care provider for help if you need support or information about quitting drugs. Alcohol use Do not drink alcohol if your health care provider tells you not to drink. If you drink alcohol: Limit how much you have to 0-2 drinks a day. Be aware of how much alcohol is in your drink. In the [place] one drink equals one 12 oz bottle of beer (355 mL), one 5 oz glass of wine (148 mL), or one 1 oz glass of hard liquor (44 mL). General instructions Schedule regular health, dental, and eye exams. Stay current with your vaccines. Tell your health care provider if: You often feel depressed. You have ever been abused or do not feel safe at home. Summary Adopting a healthy lifestyle and getting preventive care are important in promoting health and wellness. Follow your health care provider's instructions about healthy diet, exercising, and getting tested or screened for diseases. Follow your health care provider's instructions on monitoring your cholesterol and blood pressure. This information is not intended to replace advice given to you by your health care provider. Make sure you discuss any questions you have with your health care provider. Document Revised: 05/28/2020 Document Reviewed: 03/13/2018 Elsevier Patient Education  2022 Elsevier Inc.  

## 2020-12-13 ENCOUNTER — Ambulatory Visit: Payer: Medicare Other | Admitting: Family Medicine

## 2020-12-13 DIAGNOSIS — E1159 Type 2 diabetes mellitus with other circulatory complications: Secondary | ICD-10-CM | POA: Diagnosis not present

## 2020-12-14 LAB — BMP8+EGFR
BUN/Creatinine Ratio: 7 — ABNORMAL LOW (ref 10–24)
BUN: 9 mg/dL (ref 8–27)
CO2: 23 mmol/L (ref 20–29)
Calcium: 9 mg/dL (ref 8.6–10.2)
Chloride: 103 mmol/L (ref 96–106)
Creatinine, Ser: 1.21 mg/dL (ref 0.76–1.27)
Glucose: 88 mg/dL (ref 65–99)
Potassium: 4.9 mmol/L (ref 3.5–5.2)
Sodium: 141 mmol/L (ref 134–144)
eGFR: 65 mL/min/{1.73_m2} (ref >59)

## 2020-12-14 LAB — HEMOGLOBIN A1C
Est. average glucose Bld gHb Est-mCnc: 180 mg/dL
Hgb A1c MFr Bld: 7.9 % — ABNORMAL HIGH (ref 4.8–5.6)

## 2020-12-16 DIAGNOSIS — L308 Other specified dermatitis: Secondary | ICD-10-CM | POA: Diagnosis not present

## 2020-12-17 ENCOUNTER — Encounter: Payer: Self-pay | Admitting: Family Medicine

## 2020-12-17 ENCOUNTER — Ambulatory Visit (INDEPENDENT_AMBULATORY_CARE_PROVIDER_SITE_OTHER): Payer: Medicare Other | Admitting: Family Medicine

## 2020-12-17 ENCOUNTER — Other Ambulatory Visit: Payer: Self-pay

## 2020-12-17 VITALS — BP 136/72 | HR 64 | Resp 16 | Ht 69.0 in | Wt 225.0 lb

## 2020-12-17 DIAGNOSIS — Z23 Encounter for immunization: Secondary | ICD-10-CM | POA: Diagnosis not present

## 2020-12-17 DIAGNOSIS — I10 Essential (primary) hypertension: Secondary | ICD-10-CM

## 2020-12-17 DIAGNOSIS — E1169 Type 2 diabetes mellitus with other specified complication: Secondary | ICD-10-CM | POA: Diagnosis not present

## 2020-12-17 DIAGNOSIS — E669 Obesity, unspecified: Secondary | ICD-10-CM

## 2020-12-17 DIAGNOSIS — E1159 Type 2 diabetes mellitus with other circulatory complications: Secondary | ICD-10-CM | POA: Diagnosis not present

## 2020-12-17 DIAGNOSIS — Z794 Long term (current) use of insulin: Secondary | ICD-10-CM | POA: Diagnosis not present

## 2020-12-17 DIAGNOSIS — E785 Hyperlipidemia, unspecified: Secondary | ICD-10-CM

## 2020-12-17 NOTE — Patient Instructions (Addendum)
Annual exam in office with MD around 2nd week in November, MMSE at visit, re evaluate blood sugar and blood pressure  You I referred to the pharmacist for assistance with medication management for your diabetes.  I do want to change your diabetic med medications and start you on Mounjaro. You need to test blood sugars every day and record  Blood pressure control needs to be better, regular exercise  and weight loss will help this  Nurse please contact [facility] and record the shingrix vaccines, states he had them  Flu vaccine in office today.  Continue to focus on healthy food choices and regular physical activity for improved health.  Thanks for choosing [facility] we consider it a privelige to serve you.

## 2020-12-20 ENCOUNTER — Telehealth: Payer: Self-pay | Admitting: *Deleted

## 2020-12-20 ENCOUNTER — Telehealth: Payer: Self-pay | Admitting: Family Medicine

## 2020-12-20 ENCOUNTER — Encounter: Payer: Self-pay | Admitting: Family Medicine

## 2020-12-20 MED ORDER — GLIPIZIDE ER 5 MG PO TB24: 5.0000 mg | ORAL_TABLET | Freq: Every day | ORAL | 1 refills | Status: DC

## 2020-12-20 NOTE — Assessment & Plan Note (Signed)
Hyperlipidemia:Low fat diet discussed and encouraged.   Lipid Panel  Lab Results  Component Value Date   CHOL 120 08/05/2020   HDL 46 08/05/2020   LDLCALC 55 08/05/2020   TRIG 99 08/05/2020   CHOLHDL 3.2 01/16/2020     Controlled, no change in medication Updated lab needed at/ before next visit.

## 2020-12-20 NOTE — Assessment & Plan Note (Signed)
  Patient re-educated about  the importance of commitment to a  minimum of 150 minutes of exercise per week as able.  The importance of healthy food choices with portion control discussed, as well as eating regularly and within a 12 hour window most days. The need to choose "clean , green" food 50 to 75% of the time is discussed, as well as to make water the primary drink and set a goal of 64 ounces water daily.    Weight /BMI 12/17/2020 09/21/2020 08/11/2020  WEIGHT 225 lb 228 lb 226 lb  HEIGHT '5\' 9"'$  '5\' 9"'$  '5\' 9"'$   BMI 33.23 kg/m2 33.67 kg/m2 33.37 kg/m2

## 2020-12-20 NOTE — Chronic Care Management (AMB) (Signed)
  Chronic Care Management   Note  12/20/2020 Name: SELBY FOISY MRN: [id number] DOB: 08-17-1950  JAHLANI LORENTZ is a 70 y.o. year old male who is a primary care patient of Moshe Cipro Norwood Levo, MD. I reached out to [name] by phone today in response to a referral sent by Mr. Keo Schirmer [facility] PCP, Dr. Moshe Cipro.      Mr. Kegley was given information about Chronic Care Management services today including:  CCM service includes personalized support from designated clinical staff supervised by his physician, including individualized plan of care and coordination with other care providers 24/7 contact phone numbers for assistance for urgent and routine care needs. Service will only be billed when office clinical staff spend 20 minutes or more in a month to coordinate care. Only one practitioner may furnish and bill the service in a calendar month. The patient may stop CCM services at any time (effective at the end of the month) by phone call to the office staff. The patient will be responsible for cost sharing (co-pay) of up to 20% of the service fee (after annual deductible is met).  Patient agreed to services and verbal consent obtained.   Follow up plan: Face to Face appointment with care management team member scheduled for: 01/11/21  [place] [facility]  Direct Dial: [phone]

## 2020-12-20 NOTE — Progress Notes (Signed)
Robert [name]     MRN: [id number]      DOB: 01-Jul-1950   HPI Robert [name] is here for follow up and re-evaluation of chronic medical conditions, medication management and review of any available recent lab and radiology data.  Preventive health is updated, specifically  Cancer screening and Immunization.   Questions or concerns regarding consultations or procedures which the PT has had in the interim are  addressed. The PT denies any adverse reactions to current medications since the last visit.  There are no new concerns.  There are no specific complaints   ROS Denies recent fever or chills. Denies sinus pressure, nasal congestion, ear pain or sore throat. Denies chest congestion, productive cough or wheezing. Denies chest pains, palpitations and leg swelling Denies abdominal pain, nausea, vomiting,diarrhea or constipation.   Denies dysuria, frequency, hesitancy or incontinence. Denies joint pain, swelling and limitation in mobility. Denies headaches, seizures, numbness, or tingling. Denies depression, anxiety or insomnia. Denies skin break down or rash.   PE  BP 136/72   Pulse 64   Resp 16   Ht '5\' 9"'$  (1.753 m)   Wt 225 lb (102.1 kg)   SpO2 94%   BMI 33.23 kg/m   Patient alert and oriented and in no cardiopulmonary distress.  HEENT: No facial asymmetry, EOMI,     Neck supple .  Chest: Clear to auscultation bilaterally.  CVS: S1, S2 no murmurs, no S3.Regular rate.  ABD: Soft non tender.   Ext: No edema  MS: Adequate ROM spine, shoulders, hips and knees.  Skin: Intact, no ulcerations or rash noted.  Psych: Good eye contact, normal affect. Memory intact not anxious or depressed appearing.  CNS: CN 2-12 intact, power,  normal throughout.no focal deficits noted.   Assessment & Plan Type 2 diabetes mellitus with vascular disease ([place]) Uncontrolled Robert [name] is reminded of the importance of commitment to daily physical activity for 30 minutes or more, as able  and the need to limit carbohydrate intake to 30 to 60 grams per meal to help with blood sugar control.   The need to take medication as prescribed, test blood sugar as directed, and to call between visits if there is a concern that blood sugar is uncontrolled is also discussed.   Robert [name] is reminded of the importance of daily foot exam, annual eye examination, and good blood sugar, blood pressure and cholesterol control.  Diabetic Labs Latest Ref Rng & Units 12/13/2020 08/05/2020 04/22/2020 01/16/2020 08/29/2019  HbA1c 4.8 - 5.6 % 7.9(H) 7.8(H) 8.4(H) 7.9(H) 6.5(H)  Microalbumin Not Estab. ug/mL - - - - -  Micro/Creat Ratio 0 - 29 mg/g creat - 55(H) - - -  Chol 100 - 199 mg/dL - 120 - 131 -  HDL >39 mg/dL - 46 - 41 -  Calc LDL 0 - 99 mg/dL - 55 - 70 -  Triglycerides 0 - 149 mg/dL - 99 - 123 -  Creatinine 0.76 - 1.27 mg/dL 1.21 1.26 1.32(H) 1.29(H) 1.28(H)   BP/Weight 12/17/2020 09/21/2020 08/11/2020 06/02/2020 04/29/2020 02/18/2020 [id number]  Systolic BP XX123456 123456 0000000 [id number]  Diastolic BP 72 87 78 69 60 78 72  Wt. (Lbs) 225 228 226 224 219 224 223  BMI 33.23 33.67 33.37 33.08 32.34 33.08 32.93   Foot/eye exam completion dates Latest Ref Rng & Units 12/17/2020 06/23/2020  Eye Exam No Retinopathy - No Retinopathy  Foot exam Order - - -  Foot Form Completion - Done -  Increase lantus to 70 units and add glipizide 5 mg to mae a total of 15 mg daily, will need to call and let him know  Hyperlipidemia LDL goal <70 Hyperlipidemia:Low fat diet discussed and encouraged.   Lipid Panel  Lab Results  Component Value Date   CHOL 120 08/05/2020   HDL 46 08/05/2020   LDLCALC 55 08/05/2020   TRIG 99 08/05/2020   CHOLHDL 3.2 01/16/2020     Controlled, no change in medication Updated lab needed at/ before next visit.   Essential hypertension Controlled, no change in medication DASH diet and commitment to daily physical activity for a minimum of 30 minutes discussed and  encouraged, as a part of hypertension management. The importance of attaining a healthy weight is also discussed.  BP/Weight 12/17/2020 09/21/2020 08/11/2020 06/02/2020 04/29/2020 02/18/2020 [id number]  Systolic BP XX123456 123456 0000000 [id number]  Diastolic BP 72 87 78 69 60 78 72  Wt. (Lbs) 225 228 226 224 219 224 223  BMI 33.23 33.67 33.37 33.08 32.34 33.08 32.93       Obesity (BMI 30.0-34.9)  Patient re-educated about  the importance of commitment to a  minimum of 150 minutes of exercise per week as able.  The importance of healthy food choices with portion control discussed, as well as eating regularly and within a 12 hour window most days. The need to choose "clean , green" food 50 to 75% of the time is discussed, as well as to make water the primary drink and set a goal of 64 ounces water daily.    Weight /BMI 12/17/2020 09/21/2020 08/11/2020  WEIGHT 225 lb 228 lb 226 lb  HEIGHT '5\' 9"'$  '5\' 9"'$  '5\' 9"'$   BMI 33.23 kg/m2 33.67 kg/m2 33.37 kg/m2

## 2020-12-20 NOTE — Assessment & Plan Note (Signed)
Uncontrolled Robert [name] is reminded of the importance of commitment to daily physical activity for 30 minutes or more, as able and the need to limit carbohydrate intake to 30 to 60 grams per meal to help with blood sugar control.   The need to take medication as prescribed, test blood sugar as directed, and to call between visits if there is a concern that blood sugar is uncontrolled is also discussed.   Robert [name] is reminded of the importance of daily foot exam, annual eye examination, and good blood sugar, blood pressure and cholesterol control.  Diabetic Labs Latest Ref Rng & Units 12/13/2020 08/05/2020 04/22/2020 01/16/2020 08/29/2019  HbA1c 4.8 - 5.6 % 7.9(H) 7.8(H) 8.4(H) 7.9(H) 6.5(H)  Microalbumin Not Estab. ug/mL - - - - -  Micro/Creat Ratio 0 - 29 mg/g creat - 55(H) - - -  Chol 100 - 199 mg/dL - 120 - 131 -  HDL >39 mg/dL - 46 - 41 -  Calc LDL 0 - 99 mg/dL - 55 - 70 -  Triglycerides 0 - 149 mg/dL - 99 - 123 -  Creatinine 0.76 - 1.27 mg/dL 1.21 1.26 1.32(H) 1.29(H) 1.28(H)   BP/Weight 12/17/2020 09/21/2020 08/11/2020 06/02/2020 04/29/2020 02/18/2020 [id number]  Systolic BP [id number] 123456 0000000 [id number]  Diastolic BP 72 87 78 69 60 78 72  Wt. (Lbs) 225 228 226 224 219 224 223  BMI 33.23 33.67 33.37 33.08 32.34 33.08 32.93   Foot/eye exam completion dates Latest Ref Rng & Units 12/17/2020 06/23/2020  Eye Exam No Retinopathy - No Retinopathy  Foot exam Order - - -  Foot Form Completion - Done -      Increase lantus to 70 units and add glipizide 5 mg to mae a total of 15 mg daily, will need to call and let him know

## 2020-12-20 NOTE — Telephone Encounter (Signed)
Called patient and left message for them to return call at the office   

## 2020-12-20 NOTE — Telephone Encounter (Signed)
Pls let pt know that for his uncontrolled diabetes, I have abdded an additional 5 mg glipizide tab, so he will take 15 mg daily with breakfast currently on 10 mg daily. I also recommend increasing the lantus dose to 65 units daily, has been taking 61 units, needs to test 3 times daily, should have 6 week f/u with diabetic log

## 2020-12-20 NOTE — Assessment & Plan Note (Signed)
Controlled, no change in medication DASH diet and commitment to daily physical activity for a minimum of 30 minutes discussed and encouraged, as a part of hypertension management. The importance of attaining a healthy weight is also discussed.  BP/Weight 12/17/2020 09/21/2020 08/11/2020 06/02/2020 04/29/2020 02/18/2020 36/68/1594  Systolic BP 707 615 183 [phone]  Diastolic BP 72 87 78 69 60 78 72  Wt. (Lbs) 225 228 226 224 219 224 223  BMI 33.23 33.67 33.37 33.08 32.34 33.08 32.93

## 2020-12-22 NOTE — Telephone Encounter (Signed)
Patient aware.

## 2021-01-01 DIAGNOSIS — Z20822 Contact with and (suspected) exposure to covid-19: Secondary | ICD-10-CM | POA: Diagnosis not present

## 2021-01-11 ENCOUNTER — Ambulatory Visit (INDEPENDENT_AMBULATORY_CARE_PROVIDER_SITE_OTHER): Payer: Medicare Other | Admitting: Pharmacist

## 2021-01-11 DIAGNOSIS — E785 Hyperlipidemia, unspecified: Secondary | ICD-10-CM

## 2021-01-11 DIAGNOSIS — Z794 Long term (current) use of insulin: Secondary | ICD-10-CM

## 2021-01-11 DIAGNOSIS — E1169 Type 2 diabetes mellitus with other specified complication: Secondary | ICD-10-CM

## 2021-01-11 NOTE — Patient Instructions (Signed)
[name],  It was great to talk to you today!  Please call me with any questions or concerns.   Visit Information   PATIENT GOALS:   Goals Addressed             This Visit's Progress    Medication Management       Patient Goals/Self-Care Activities Patient will:  Take medications as prescribed Check blood sugar twice a day at the following times: 5-15 minutes before breakfast, bedtime, and whenever patient experiences symptoms of hypo/hyperglycemia, document, and provide at future appointments Check blood pressure at least once daily, document, and provide at future appointments Target a minimum of 150 minutes of moderate intensity exercise weekly         Consent to CCM Services: Mr. Picklesimer was given information about Chronic Care Management services including:  CCM service includes personalized support from designated clinical staff supervised by his physician, including individualized plan of care and coordination with other care providers 24/7 contact phone numbers for assistance for urgent and routine care needs. Service will only be billed when office clinical staff spend 20 minutes or more in a month to coordinate care. Only one practitioner may furnish and bill the service in a calendar month. The patient may stop CCM services at any time (effective at the end of the month) by phone call to the office staff. The patient will be responsible for cost sharing (co-pay) of up to 20% of the service fee (after annual deductible is met).  Patient agreed to services and verbal consent obtained.   Patient verbalizes understanding of instructions provided today and agrees to view in [place].   Telephone follow up appointment with care management team member scheduled for:02/16/21  Kennon Holter, PharmD Clinical Pharmacist [facility] [phone]  CLINICAL CARE PLAN: Patient Care Plan: Medication Management     Problem Identified: T2DM & HTN    Priority: High  Onset Date: 01/11/2021     Long-Range Goal: Disease Progression Prevention   Start Date: 01/11/2021  Expected End Date: 04/11/2021  This Visit's Progress: On track  Priority: High  Note:   Current Barriers:  Unable to achieve control of diabetes Suboptimal therapeutic regimen for diabetes  Pharmacist Clinical Goal(s):  Patient will achieve control of diabetes as evidenced by improved A1c adhere to plan to optimize therapeutic regimen for diabetes as evidenced by report of adherence to recommended medication management changes through collaboration with PharmD and provider.   Interventions: 1:1 collaboration with Fayrene Helper, MD regarding development and update of comprehensive plan of care as evidenced by provider attestation and co-signature Inter-disciplinary care team collaboration (see longitudinal plan of care) Comprehensive medication review performed; medication list updated in electronic medical record  Type 2 Diabetes - New goal.: Uncontrolled; Most recent A1c above goal of <7% per ADA guidelines Current medications: glipizide XL 15 mg by mouth with breakfast and insulin glargine (Lantus) 65 units subcutaneously at bedtime Intolerances:  metformin (GI intolerance) Taking medications as directed: yes Side effects thought to be attributed to current medication regimen: no Denies hypoglycemic symptoms (sweaty and shaky). Hypoglycemia prevention: not indicated at this time Current meal patterns: not discussed today Current exercise:  plays golf most days On a statin: yes On aspirin 81 mg daily: yes Last microalbumin/creatinine ratio: 55 (08/05/20); on an ACEi/ARB: yes Last eye exam: completed within last year Last foot exam: completed within last year Current glucose readings: fasting blood glucose: within goal range of 80-130 mg/dL per ADA guidelines. post  prandial glucose: at goal of <180 mg/dL per ADA guidelines. The following education was  provided: Hypoglycemia: I have discussed with the patient how to treat hypoglycemia by the rule of 15; eat/drink 15g of sugar in the form of glucose tabs, 4 ounces of juice or soda and recheck fingerstick glucose in 15 minutes. Chocolate bars and ice cream should be avoided because fat delays carbohydrate digestion and absorption. Retreat if glucose remains low. Driving should cease until glucose is normal. Instructed to monitor blood sugars twice a day at the following times: 5-15 minutes before breakfast, bedtime, and whenever patient experiences symptoms of hypo/hyperglycemia  Patient identified as a good candidate for a GLP-1 receptor agonist given reduction in cardiovascular disease, slowed chronic kidney disease progression, low risk of hypoglycemia, increased satiety , and weight loss. Patient denies a personal or family history of medullary thyroid carcinoma (MTC) or Multiple Endocrine Neoplasia syndrome type 2 (MEN 2). Patient also denies any history of pancreatitis or biliary disease. Patient identified as a good candidate for SGLT-2 inhibitor given reduction in cardiovascular disease, slowed chronic kidney disease progression, low risk of hypoglycemia, weight loss, and blood pressure lowering. Patient denies a history of significant genitourinary infections. No concern for hypotension/volume depletion. GFR at least >20 mL/minute/1.73 m2. Continue Lantus 65 units at bedtime and glipizide 15 mg by mouth before breakfast for now; however, will discuss the above recommendations with the PCP. PCP put in referral that she wants to initiate the patient on Mounjaro; however, [employer] has not added this to their formulary yet. At this time, would recommend starting Trulicity 3.53 mg subcutaneously weekly. Medication assistance workup complete and patient has an income above the threshold. Patient reports that he is able to afford the co-pay though. Can switch patient to [facility] once added to  patient's insurance formulary. Would recommend decreasing or discontinuing glipizide dose upon initiation of GLP-1 agonist. Patient asks about continuous glucose monitor. Will discuss further at next visit. Since the patient has Medicare, will need to send prescription through DME supplier.   Hypertension - New goal.: Blood pressure under suboptimal control. Blood pressure is above goal of <130/80 mmHg per 2017 AHA/ACC guidelines. Current medications: olmesartan 20 mg by mouth at bedtime and metoprolol succinate 50 mg by mouth at bedtime Intolerances: none Taking medications as directed: yes Side effects thought to be attributed to current medication regimen: no Current home blood pressure: patient has a blood pressure machine but does not currently check  Encourage dietary sodium restriction/DASH diet Recommend regular aerobic exercise Recommend home blood pressure monitoring to discuss at next visit Discussed need for medication compliance Continue current medications as above for now; however, could consider switching from metoprolol to carvedilol twice daily to improve blood pressure effect since carvedilol has alpha blocking properties that metoprolol does not. Could also consider adding amlodipine and combine with olmesartan. Calcium channel blockers or thiazides are first line for Black patients   Patient Goals/Self-Care Activities Patient will:  Take medications as prescribed Check blood sugar twice a day at the following times: 5-15 minutes before breakfast, bedtime, and whenever patient experiences symptoms of hypo/hyperglycemia, document, and provide at future appointments Check blood pressure at least once daily, document, and provide at future appointments Target a minimum of 150 minutes of moderate intensity exercise weekly  Follow Up Plan: Telephone follow up appointment with care management team member scheduled for: 02/16/21

## 2021-01-11 NOTE — Chronic Care Management (AMB) (Signed)
Chronic Care Management Pharmacy Note  01/11/2021 Name:  Robert [name] MRN:  [id number] DOB:  Aug 20, 1950  Summary: Type-2 diabetes: Patient identified as a good candidate for a GLP-1 receptor agonist given reduction in cardiovascular disease, slowed chronic kidney disease progression, low risk of hypoglycemia, increased satiety , and weight loss. Patient denies a personal or family history of medullary thyroid carcinoma (MTC) or Multiple Endocrine Neoplasia syndrome type 2 (MEN 2). Patient also denies any history of pancreatitis or biliary disease. Patient identified as a good candidate for SGLT-2 inhibitor given reduction in cardiovascular disease, slowed chronic kidney disease progression, low risk of hypoglycemia, weight loss, and blood pressure lowering. Patient denies a history of significant genitourinary infections. No concern for hypotension/volume depletion. GFR at least >20 mL/minute/1.73 m2. Continue Lantus 65 units at bedtime and glipizide 15 mg by mouth before breakfast for now; however, will discuss the above recommendations with the PCP. PCP put in referral that she wants to initiate the patient on Mounjaro; however, [employer] has not added this to their formulary yet. At this time, would recommend starting Trulicity 5.46 mg subcutaneously weekly. Medication assistance workup complete and patient has an income above the threshold. Patient reports that he is able to afford the co-pay though. Can switch patient to [facility] once added to patient's insurance formulary. Would recommend decreasing or discontinuing glipizide dose upon initiation of GLP-1 agonist. Patient asks about continuous glucose monitor. Will discuss further at next visit. Since the patient has Medicare, will need to send prescription through DME supplier.   Hypertension Continue olmesartan 20 mg by mouth daily and metoprolol succinate 50 mg by mouth daily for now; however, could consider switching  from metoprolol to carvedilol twice daily to improve blood pressure effect since carvedilol has alpha blocking properties that metoprolol does not. Could also consider adding amlodipine and combine with olmesartan (Azor). Calcium channel blockers or thiazides are first line hypertension agents for Black patients.  Subjective: Robert [name] is an 70 y.o. year old male who is a primary patient of Moshe Cipro, Norwood Levo, MD.  The CCM team was consulted for assistance with disease management and care coordination needs.    Engaged with patient by telephone for initial visit in response to provider referral for pharmacy case management and/or care coordination services.   Consent to Services:  The patient was given the following information about Chronic Care Management services today, agreed to services, and gave verbal consent: 1. CCM service includes personalized support from designated clinical staff supervised by the primary care provider, including individualized plan of care and coordination with other care providers 2. 24/7 contact phone numbers for assistance for urgent and routine care needs. 3. Service will only be billed when office clinical staff spend 20 minutes or more in a month to coordinate care. 4. Only one practitioner may furnish and bill the service in a calendar month. 5.The patient may stop CCM services at any time (effective at the end of the month) by phone call to the office staff. 6. The patient will be responsible for cost sharing (co-pay) of up to 20% of the service fee (after annual deductible is met). Patient agreed to services and consent obtained.  Patient Care Team: Fayrene Helper, MD as PCP - General Branch, Alphonse Guild, MD as PCP - Cardiology (Cardiology) Beryle Lathe, [facility] (Pharmacist)   Objective:  Lab Results  Component Value Date   CREATININE 1.21 12/13/2020   CREATININE 1.26 08/05/2020   CREATININE 1.32 (  H) 04/22/2020    Lab Results   Component Value Date   HGBA1C 7.9 (H) 12/13/2020   Last diabetic Eye exam:  Lab Results  Component Value Date/Time   HMDIABEYEEXA No Retinopathy 06/23/2020 12:00 AM    Last diabetic Foot exam:  Lab Results  Component Value Date/Time   HMDIABFOOTEX yes  01/13/2010 12:00 AM        Component Value Date/Time   CHOL 120 08/05/2020 0842   TRIG 99 08/05/2020 0842   HDL 46 08/05/2020 0842   CHOLHDL 3.2 01/16/2020 0802   VLDL 17 08/11/2016 0821   LDLCALC 55 08/05/2020 0842   LDLCALC 70 01/16/2020 0802    Hepatic Function Latest Ref Rng & Units 08/05/2020 01/16/2020 08/29/2019  Total Protein 6.0 - 8.5 g/dL 7.1 6.6 7.0  Albumin 3.8 - 4.8 g/dL 4.3 - -  AST 0 - 40 IU/L '20 19 22  ' ALT 0 - 44 IU/L '30 22 25  ' Alk Phosphatase 44 - 121 IU/L 129(H) - -  Total Bilirubin 0.0 - 1.2 mg/dL 0.5 0.4 0.3  Bilirubin, Direct 0.0 - 0.3 mg/dL - - -    Lab Results  Component Value Date/Time   TSH 1.76 11/04/2018 07:51 AM   TSH 1.81 11/21/2017 07:23 AM    CBC Latest Ref Rng & Units 08/05/2020 01/16/2020 04/02/2018  WBC 3.4 - 10.8 x10E3/uL 6.2 5.1 5.4  Hemoglobin 13.0 - 17.7 g/dL 13.6 13.2 13.2  Hematocrit 37.5 - 51.0 % 40.5 39.7 41.0  Platelets 150 - 450 x10E3/uL 224 228 268    Lab Results  Component Value Date/Time   VD25OH 26 (L) 11/04/2018 07:51 AM   VD25OH 23 (L) 11/21/2017 07:23 AM    Clinical ASCVD: Yes  The ASCVD Risk score (Arnett DK, et al., 2019) failed to calculate for the following reasons:   The valid total cholesterol range is 130 to 320 mg/dL     Social History   Tobacco Use  Smoking Status Former   Packs/day: 1.00   Years: 30.00   Pack years: 30.00   Types: Cigarettes   Start date: 04/03/1966   Quit date: 09/02/1983   Years since quitting: 37.3  Smokeless Tobacco Never   BP Readings from Last 3 Encounters:  12/17/20 136/72  09/21/20 (!) 154/87  08/11/20 138/78   Pulse Readings from Last 3 Encounters:  12/17/20 64  09/21/20 72  08/11/20 65   Wt Readings from  Last 3 Encounters:  12/17/20 225 lb (102.1 kg)  09/21/20 228 lb (103.4 kg)  08/11/20 226 lb (102.5 kg)    Assessment: Review of patient past medical history, allergies, medications, health status, including review of consultants reports, laboratory and other test data, was performed as part of comprehensive evaluation and provision of chronic care management services.   SDOH:  (Social Determinants of Health) assessments and interventions performed:    CCM Care Plan  Allergies  Allergen Reactions   Metformin And Related Other (See Comments)    Stomach upset   Rosuvastatin Other (See Comments)    Adverse GI symptoms   Codeine     "Did not work" and "made me feel foolish"    Medications Reviewed Today     Reviewed by Beryle Lathe, [facility] (Pharmacist) on 01/11/21 at [place] List Status: <None>   Medication Order Taking? Sig Documenting Provider Last Dose Status Informant  ACCU-CHEK FASTCLIX LANCETS [place] [id number] Yes Use up to three times daily Dx e11.22 Fayrene Helper, MD Taking Active   ACCU-CHEK  SMARTVIEW test strip [id number] Yes USE AS INSTRUCTED 3 TIMES  DAILY Fayrene Helper, MD Taking Active   acetaminophen (TYLENOL) 500 MG tablet [id number] Yes Take 500 mg by mouth as needed. [provider] Taking Active   aspirin 81 MG tablet [id number] Yes Take 81 mg by mouth daily. [provider] Taking Active Self  atorvastatin (LIPITOR) 40 MG tablet [id number] Yes TAKE 1 TABLET BY MOUTH  DAILY Strader, Fransisco Hertz, PA-C Taking Active   Blood Glucose Monitoring Suppl (ACCU-CHEK NANO SMARTVIEW) w/Device KIT [id number] Yes TEST 1 TO 2 TIMES DAILY Fayrene Helper, MD Taking Active   glipiZIDE (GLUCOTROL XL) 5 MG 24 hr tablet [id number] Yes Take 1 tablet (5 mg total) by mouth daily with breakfast. Fayrene Helper, MD Taking Active   glipiZIDE (GLUCOTROL) 10 MG tablet [id number] Yes Take 1 tablet (10 mg total) by mouth daily before breakfast. Fayrene Helper, MD Taking Active   Insulin Pen Needle (B-D ULTRAFINE III SHORT PEN) 31G X 8 MM MISC [id number]  USE AS DIRECTED DAILY Fayrene Helper, MD  Active   Insulin Syringe-Needle U-100 (INSULIN SYRINGE .5CC/30GX5/16") 30G X 5/16" 0.5 ML MISC [id number]  Use as directed with novolog 70/30 Fayrene Helper, MD  Active   LANTUS SOLOSTAR 100 UNIT/ML Solostar Pen [id number] Yes INJECT SUBCUTANEOUSLY 70  UNITS DAILY  Patient taking differently: Inject 65 Units into the skin at bedtime.   Fayrene Helper, MD Taking Active            Med Note Beryle Lathe   Tue Jan 11, 2021  3:12 PM)    Magnesium 250 MG TABS [id number] Yes Take 250 mg by mouth daily. [provider] Taking Active Self  metoprolol succinate (TOPROL-XL) 50 MG 24 hr tablet [id number] Yes TAKE 1 TABLET BY MOUTH  DAILY WITH OR IMMEDIATELY  AFTER A MEAL Fayrene Helper, MD Taking Active   nitroGLYCERIN (NITROSTAT) 0.4 MG SL tablet [id number] No Place 1 tablet (0.4 mg total) under the tongue every 5 (five) minutes as needed for chest pain.  Patient not taking: Reported on 01/11/2021   Erma Heritage, PA-C Not Taking Active   nystatin (MYCOSTATIN/NYSTOP) powder [id number] Yes Apply topically between toes [provider] Taking Active   olmesartan (BENICAR) 20 MG tablet [id number] Yes TAKE 1 TABLET BY MOUTH  DAILY  Patient taking differently: Take 20 mg by mouth at bedtime.   Erma Heritage, PA-C Taking Active Self  sildenafil (VIAGRA) 100 MG tablet [id number] No Take 1 tablet (100 mg total) by mouth daily as needed for erectile dysfunction.  Patient not taking: Reported on 01/11/2021   Fayrene Helper, MD Not Taking Active   triamcinolone cream (KENALOG) 0.1 % [id number] Yes Apply twice daily to affected areas [provider] Taking Active             Patient Active Problem List   Diagnosis Date Noted   Rash 05/04/2020   Knee pain, right 11/05/2018   Type 2 diabetes  mellitus with vascular disease ([place]) 03/05/2018   Hypersomnia with sleep apnea 01/16/2016   Seasonal allergies 01/04/2015   Tubular adenoma of colon 11/13/2014   ASCVD (arteriosclerotic cardiovascular disease) 08/21/2014   Bilateral hand pain 04/06/2014   Back pain 09/16/2013   Essential hypertension    Hyperlipidemia LDL goal <70    Obesity (BMI 30.0-34.9) 12/24/2011   ED (erectile dysfunction) 02/14/2011   Cardiovascular disease 09/27/2009   TOBACCO ABUSE, HX OF 09/27/2009  Immunization History  Administered Date(s) Administered   Fluad Quad(high Dose 65+) 01/01/2019, 12/17/2020   Influenza Split 02/14/2011, 12/18/2011   Influenza Whole 12/25/2006, 12/23/2008, 01/13/2010   Influenza,inj,Quad PF,6+ Mos 01/10/2013, 12/02/2013, 01/26/2015, 12/09/2015, 11/30/2016, 12/31/2017, 12/13/2019   PFIZER(Purple Top)SARS-COV-2 Vaccination 05/01/2019, 05/23/2019, 01/08/2020, 07/12/2020   Pneumococcal Conjugate-13 11/10/2013   Pneumococcal Polysaccharide-23 03/10/2004, 01/13/2010, 11/30/2016   Tdap 12/18/2011   Zoster Recombinat (Shingrix) 12/17/2019   Zoster, Live 12/18/2011    Conditions to be addressed/monitored: HTN and DMII  Care Plan : Medication Management  Updates made by Beryle Lathe, [place] since 01/11/2021 12:00 AM     Problem: T2DM & HTN   Priority: High  Onset Date: 01/11/2021     Long-Range Goal: Disease Progression Prevention   Start Date: 01/11/2021  Expected End Date: 04/11/2021  This Visit's Progress: On track  Priority: High  Note:   Current Barriers:  Unable to achieve control of diabetes Suboptimal therapeutic regimen for diabetes  Pharmacist Clinical Goal(s):  Patient will achieve control of diabetes as evidenced by improved A1c adhere to plan to optimize therapeutic regimen for diabetes as evidenced by report of adherence to recommended medication management changes through collaboration with PharmD and provider.   Interventions: 1:1  collaboration with Fayrene Helper, MD regarding development and update of comprehensive plan of care as evidenced by provider attestation and co-signature Inter-disciplinary care team collaboration (see longitudinal plan of care) Comprehensive medication review performed; medication list updated in electronic medical record  Type 2 Diabetes - New goal.: Uncontrolled; Most recent A1c above goal of <7% per ADA guidelines Current medications: glipizide XL 15 mg by mouth with breakfast and insulin glargine (Lantus) 65 units subcutaneously at bedtime Intolerances:  metformin (GI intolerance) Taking medications as directed: yes Side effects thought to be attributed to current medication regimen: no Denies hypoglycemic symptoms (sweaty and shaky). Hypoglycemia prevention: not indicated at this time Current meal patterns: not discussed today Current exercise:  plays golf most days On a statin: yes On aspirin 81 mg daily: yes Last microalbumin/creatinine ratio: 55 (08/05/20); on an ACEi/ARB: yes Last eye exam: completed within last year Last foot exam: completed within last year Current glucose readings: fasting blood glucose: within goal range of 80-130 mg/dL per ADA guidelines. post prandial glucose: at goal of <180 mg/dL per ADA guidelines. The following education was provided: Hypoglycemia: I have discussed with the patient how to treat hypoglycemia by the rule of 15; eat/drink 15g of sugar in the form of glucose tabs, 4 ounces of juice or soda and recheck fingerstick glucose in 15 minutes. Chocolate bars and ice cream should be avoided because fat delays carbohydrate digestion and absorption. Retreat if glucose remains low. Driving should cease until glucose is normal. Instructed to monitor blood sugars twice a day at the following times: 5-15 minutes before breakfast, bedtime, and whenever patient experiences symptoms of hypo/hyperglycemia  Patient identified as a good candidate for a GLP-1  receptor agonist given reduction in cardiovascular disease, slowed chronic kidney disease progression, low risk of hypoglycemia, increased satiety , and weight loss. Patient denies a personal or family history of medullary thyroid carcinoma (MTC) or Multiple Endocrine Neoplasia syndrome type 2 (MEN 2). Patient also denies any history of pancreatitis or biliary disease. Patient identified as a good candidate for SGLT-2 inhibitor given reduction in cardiovascular disease, slowed chronic kidney disease progression, low risk of hypoglycemia, weight loss, and blood pressure lowering. Patient denies a history of significant genitourinary infections. No concern for hypotension/volume depletion. GFR at  least >20 mL/minute/1.73 m2. Continue Lantus 65 units at bedtime and glipizide 15 mg by mouth before breakfast for now; however, will discuss the above recommendations with the PCP. PCP put in referral that she wants to initiate the patient on Mounjaro; however, [employer] has not added this to their formulary yet. At this time, would recommend starting Trulicity 2.02 mg subcutaneously weekly. Medication assistance workup complete and patient has an income above the threshold. Patient reports that he is able to afford the co-pay though. Can switch patient to [facility] once added to patient's insurance formulary. Would recommend decreasing or discontinuing glipizide dose upon initiation of GLP-1 agonist. Patient asks about continuous glucose monitor. Will discuss further at next visit. Since the patient has Medicare, will need to send prescription through DME supplier.   Hypertension - New goal.: Blood pressure under suboptimal control. Blood pressure is above goal of <130/80 mmHg per 2017 AHA/ACC guidelines. Current medications: olmesartan 20 mg by mouth at bedtime and metoprolol succinate 50 mg by mouth at bedtime Intolerances: none Taking medications as directed: yes Side effects thought to be  attributed to current medication regimen: no Current home blood pressure: patient has a blood pressure machine but does not currently check  Encourage dietary sodium restriction/DASH diet Recommend regular aerobic exercise Recommend home blood pressure monitoring to discuss at next visit Discussed need for medication compliance Continue current medications as above for now; however, could consider switching from metoprolol to carvedilol twice daily to improve blood pressure effect since carvedilol has alpha blocking properties that metoprolol does not. Could also consider adding amlodipine and combine with olmesartan. Calcium channel blockers or thiazides are first line for Black patients   Patient Goals/Self-Care Activities Patient will:  Take medications as prescribed Check blood sugar twice a day at the following times: 5-15 minutes before breakfast, bedtime, and whenever patient experiences symptoms of hypo/hyperglycemia, document, and provide at future appointments Check blood pressure at least once daily, document, and provide at future appointments Target a minimum of 150 minutes of moderate intensity exercise weekly  Follow Up Plan: Telephone follow up appointment with care management team member scheduled for: 02/16/21      Medication Assistance: None required.  Patient affirms current coverage meets needs.  Patient's preferred pharmacy is:  [facility] [address], [place] - [place] [address] [place] [address] Phone: [phone] Fax: [phone]  [facility]  ([place]) - [place], [place] [address] [place] [address] [place] [address] Phone: [phone] Fax: [phone]  [id number] [place]Phone: [phone]: [phone] Follow Up:  Patient agrees to Care Plan and Follow-up.  Plan: Telephone  follow up appointment with care management team member scheduled for:  02/16/21  CKennon Holter PharmD Clinical Pharmacist [facility] [phone]

## 2021-01-20 ENCOUNTER — Telehealth: Payer: Self-pay | Admitting: Family Medicine

## 2021-01-20 NOTE — Telephone Encounter (Signed)
Pt called in with Blood sugar issues. BS this morning was 56 and yesterdays was 62. Pt says hes waking up with a nervous feeling. Pt also states that meds were upped and wants to. know if he should dial them back. Prefers call back on cell

## 2021-01-20 NOTE — Telephone Encounter (Signed)
States he was just here and his dm meds were increased. Do you want him to reduce his dosage since now having lows?

## 2021-01-27 ENCOUNTER — Other Ambulatory Visit: Payer: Self-pay | Admitting: Family Medicine

## 2021-01-29 ENCOUNTER — Other Ambulatory Visit: Payer: Self-pay | Admitting: Family Medicine

## 2021-01-30 ENCOUNTER — Other Ambulatory Visit: Payer: Self-pay | Admitting: Student

## 2021-01-31 DIAGNOSIS — E1169 Type 2 diabetes mellitus with other specified complication: Secondary | ICD-10-CM

## 2021-01-31 DIAGNOSIS — Z794 Long term (current) use of insulin: Secondary | ICD-10-CM | POA: Diagnosis not present

## 2021-01-31 DIAGNOSIS — E785 Hyperlipidemia, unspecified: Secondary | ICD-10-CM | POA: Diagnosis not present

## 2021-02-11 ENCOUNTER — Other Ambulatory Visit: Payer: Self-pay

## 2021-02-11 ENCOUNTER — Encounter: Payer: Self-pay | Admitting: Family Medicine

## 2021-02-11 ENCOUNTER — Ambulatory Visit (INDEPENDENT_AMBULATORY_CARE_PROVIDER_SITE_OTHER): Payer: Medicare Other | Admitting: Family Medicine

## 2021-02-11 VITALS — BP 130/72 | HR 77 | Resp 16 | Ht 68.75 in | Wt 226.0 lb

## 2021-02-11 DIAGNOSIS — E785 Hyperlipidemia, unspecified: Secondary | ICD-10-CM

## 2021-02-11 DIAGNOSIS — R7302 Impaired glucose tolerance (oral): Secondary | ICD-10-CM

## 2021-02-11 DIAGNOSIS — I10 Essential (primary) hypertension: Secondary | ICD-10-CM

## 2021-02-11 DIAGNOSIS — R42 Dizziness and giddiness: Secondary | ICD-10-CM

## 2021-02-11 DIAGNOSIS — Z0000 Encounter for general adult medical examination without abnormal findings: Secondary | ICD-10-CM

## 2021-02-11 DIAGNOSIS — Z125 Encounter for screening for malignant neoplasm of prostate: Secondary | ICD-10-CM

## 2021-02-11 MED ORDER — MECLIZINE HCL 25 MG PO TABS: 25.0000 mg | ORAL_TABLET | Freq: Three times a day (TID) | ORAL | 0 refills | Status: DC | PRN

## 2021-02-11 NOTE — Patient Instructions (Addendum)
F/U in March, call if you need me sooner Annual exam in 366 days  Please reduce insulin to 53 units daily  Goal for fasting blood sugar ranges from 80 to 120 and 2 hours after any meal or at bedtime should be between 130 to 170.  Meclizine is prescribed for short term use for light headedness / vertigo (sorry we missed the message) ([facility] [place])  You are referred for Carotid doppler  Fasting lipid, cmp and eGFR, HBA1C, TSH, PSA Dec 12 or shortly after  Nurse pls obtain new pharmacy info from pt, states he is in the process of changing  It is important that you exercise regularly at least 30 minutes 5 times a week. If you develop chest pain, have severe difficulty breathing, or feel very tired, stop exercising immediately and seek medical attention  Thanks for choosing [facility], we consider it a privelige to serve you.'  And vit D

## 2021-02-11 NOTE — Progress Notes (Signed)
   Robert [name]     MRN: [id number]      DOB: May 06, 1950   HPI: Patient is in for annual physical exam. C/o light headedness with change in position. Recent labs, if available are reviewed. Immunization is reviewed , and  updated if needed.    PE; BP 130/72   Pulse 77   Resp 16   Ht 5' 8.75" (1.746 m)   Wt 226 lb (102.5 kg)   SpO2 94%   BMI 33.62 kg/m   Pleasant male, alert and oriented x 3, in no cardio-pulmonary distress. Afebrile. HEENT No facial trauma or asymetry. Sinuses non tender. EOMI External ears normal,  Neck: supple, no adenopathy,JVD or thyromegaly. bruits.  Chest: Clear to ascultation bilaterally.No crackles or wheezes. Non tender to palpation  Cardiovascular system; Heart sounds normal,  S1 and  S2 ,no S3.  No murmur, or thrill. Apical beat not displaced Peripheral pulses normal.  Abdomen: Soft, non tender, no organomegaly or masses. No bruits. Bowel sounds normal. No guarding, tenderness or rebound.    Musculoskeletal exam: Full ROM of spine, hips , shoulders and knees. No deformity ,swelling or crepitus noted. No muscle wasting or atrophy.   Neurologic: Cranial nerves 2 to 12 intact. Power, tone ,sensation and reflexes normal throughout. No disturbance in gait. No tremor.  Skin: Intact, no ulceration, erythema , scaling or rash noted. Pigmentation normal throughout  Psych; Normal mood and affect. Judgement and concentration normal   Assessment & Plan:  Annual physical exam Annual exam as documented. Counseling done  re healthy lifestyle involving commitment to 150 minutes exercise per week, heart healthy diet, and attaining healthy weight.The importance of adequate sleep also discussed. Regular seat belt use and home safety, is also discussed. Changes in health habits are decided on by the patient with goals and time frames  set for achieving them. Immunization and cancer screening needs are specifically addressed at this  visit.   Light headedness C/o light headeness with bruit and vascular disease, needs carotid US

## 2021-02-12 ENCOUNTER — Encounter: Payer: Self-pay | Admitting: Family Medicine

## 2021-02-12 DIAGNOSIS — R42 Dizziness and giddiness: Secondary | ICD-10-CM | POA: Insufficient documentation

## 2021-02-12 NOTE — Assessment & Plan Note (Signed)

## 2021-02-12 NOTE — Assessment & Plan Note (Signed)
C/o light headeness with bruit and vascular disease, needs carotid US

## 2021-02-16 ENCOUNTER — Encounter: Payer: Self-pay | Admitting: Podiatry

## 2021-02-16 ENCOUNTER — Other Ambulatory Visit: Payer: Self-pay

## 2021-02-16 ENCOUNTER — Ambulatory Visit (INDEPENDENT_AMBULATORY_CARE_PROVIDER_SITE_OTHER): Payer: Medicare Other | Admitting: Pharmacist

## 2021-02-16 ENCOUNTER — Ambulatory Visit: Payer: Medicare Other | Admitting: Podiatry

## 2021-02-16 DIAGNOSIS — M79674 Pain in right toe(s): Secondary | ICD-10-CM

## 2021-02-16 DIAGNOSIS — B351 Tinea unguium: Secondary | ICD-10-CM

## 2021-02-16 DIAGNOSIS — Z794 Long term (current) use of insulin: Secondary | ICD-10-CM

## 2021-02-16 DIAGNOSIS — Q828 Other specified congenital malformations of skin: Secondary | ICD-10-CM | POA: Diagnosis not present

## 2021-02-16 DIAGNOSIS — E1151 Type 2 diabetes mellitus with diabetic peripheral angiopathy without gangrene: Secondary | ICD-10-CM | POA: Diagnosis not present

## 2021-02-16 DIAGNOSIS — I10 Essential (primary) hypertension: Secondary | ICD-10-CM

## 2021-02-16 DIAGNOSIS — M79675 Pain in left toe(s): Secondary | ICD-10-CM | POA: Diagnosis not present

## 2021-02-16 DIAGNOSIS — E1169 Type 2 diabetes mellitus with other specified complication: Secondary | ICD-10-CM

## 2021-02-16 NOTE — Chronic Care Management (AMB) (Signed)
  Chronic Care Management Pharmacy Note  02/16/2021 Name:  Robert [name] MRN:  [id number] DOB:  07/14/1950  Summary:  Type 2 Diabetes Continue Lantus 53 units at bedtime and glipizide 15 mg by mouth before breakfast for now Patient identified as a good continuous glucose monitor candidate. Will send prescription for Dexcom G6 to DME supplier. Patient will use his smart phone. Patient instructed to let me know if he needs me to help him place and setup. Patient plans to switch from [employer] to [employer] in 2023. He has 3 months supply of Lantus remaining but would like to switch to Tresiba Flextouch when he runs out.  Will discuss potentially starting Trulicity 0.75 mg subcutaneously weekly at next visit. Medication assistance workup complete and patient has an income above the threshold. Patient reports that he is able to afford the co-pay though. Can switch patient to Mounjaro once added to patient's insurance formulary. Would recommend decreasing or discontinuing glipizide dose upon initiation of GLP-1 agonist.  Subjective: Robert [name] is an 70 y.o. year old male who is a primary patient of Simpson, Margaret E, MD.  The CCM team was consulted for assistance with disease management and care coordination needs.    Engaged with patient by telephone for follow up visit in response to provider referral for pharmacy case management and/or care coordination services.   Consent to Services:  The patient was given information about Chronic Care Management services, agreed to services, and gave verbal consent prior to initiation of services.  Please see initial visit note for detailed documentation.   Patient Care Team: Simpson, Margaret E, MD as PCP - General Branch, Jonathan F, MD as PCP - Cardiology (Cardiology) ,  J, RPH (Pharmacist)  Objective:  Lab Results  Component Value Date   CREATININE 1.21 12/13/2020   CREATININE 1.26 08/05/2020   CREATININE 1.32 (H) 04/22/2020     Lab Results  Component Value Date   HGBA1C 7.9 (H) 12/13/2020   Last diabetic Eye exam:  Lab Results  Component Value Date/Time   HMDIABEYEEXA No Retinopathy 06/23/2020 12:00 AM    Last diabetic Foot exam:  Lab Results  Component Value Date/Time   HMDIABFOOTEX yes 01/13/2010 12:00 AM        Component Value Date/Time   CHOL 120 08/05/2020 0842   TRIG 99 08/05/2020 0842   HDL 46 08/05/2020 0842   CHOLHDL 3.2 01/16/2020 0802   VLDL 17 08/11/2016 0821   LDLCALC 55 08/05/2020 0842   LDLCALC 70 01/16/2020 0802    Hepatic Function Latest Ref Rng & Units 08/05/2020 01/16/2020 08/29/2019  Total Protein 6.0 - 8.5 g/dL 7.1 6.6 7.0  Albumin 3.8 - 4.8 g/dL 4.3 - -  AST 0 - 40 IU/L 20 19 22  ALT 0 - 44 IU/L 30 22 25  Alk Phosphatase 44 - 121 IU/L 129(H) - -  Total Bilirubin 0.0 - 1.2 mg/dL 0.5 0.4 0.3  Bilirubin, Direct 0.0 - 0.3 mg/dL - - -    Lab Results  Component Value Date/Time   TSH 1.76 11/04/2018 07:51 AM   TSH 1.81 11/21/2017 07:23 AM    CBC Latest Ref Rng & Units 08/05/2020 01/16/2020 04/02/2018  WBC 3.4 - 10.8 x10E3/uL 6.2 5.1 5.4  Hemoglobin 13.0 - 17.7 g/dL 13.6 13.2 13.2  Hematocrit 37.5 - 51.0 % 40.5 39.7 41.0  Platelets 150 - 450 x10E3/uL 224 228 268    Lab Results  Component Value Date/Time   VD25OH 26 (L) 11/04/2018 07:51 AM     VD25OH 23 (L) 11/21/2017 07:23 AM    Clinical ASCVD: No  The ASCVD Risk score (Arnett DK, et al., 2019) failed to calculate for the following reasons:   The valid total cholesterol range is 130 to 320 mg/dL    Social History   Tobacco Use  Smoking Status Former   Packs/day: 1.00   Years: 30.00   Pack years: 30.00   Types: Cigarettes   Start date: 04/03/1966   Quit date: 09/02/1983   Years since quitting: 37.4  Smokeless Tobacco Never   BP Readings from Last 3 Encounters:  02/11/21 130/72  12/17/20 136/72  09/21/20 (!) 154/87   Pulse Readings from Last 3 Encounters:  02/11/21 77  12/17/20 64  09/21/20 72   Wt  Readings from Last 3 Encounters:  02/11/21 226 lb (102.5 kg)  12/17/20 225 lb (102.1 kg)  09/21/20 228 lb (103.4 kg)    Assessment: Review of patient past medical history, allergies, medications, health status, including review of consultants reports, laboratory and other test data, was performed as part of comprehensive evaluation and provision of chronic care management services.   SDOH:  (Social Determinants of Health) assessments and interventions performed:    CCM Care Plan  Allergies  Allergen Reactions   Metformin And Related Other (See Comments)    Stomach upset   Rosuvastatin Other (See Comments)    Adverse GI symptoms   Codeine     "Did not work" and "made me feel foolish"    Medications Reviewed Today     Reviewed by Beryle Lathe, [facility] (Pharmacist) on 02/16/21 at [place] List Status: <None>   Medication Order Taking? Sig Documenting Provider Last Dose Status Informant  ACCU-CHEK FASTCLIX LANCETS MISC [id number]  Use up to three times daily Dx e11.22 Fayrene Helper, MD  Active   ACCU-CHEK SMARTVIEW test strip [id number]  USE AS INSTRUCTED 3 TIMES  DAILY Fayrene Helper, MD  Active   acetaminophen (TYLENOL) 500 MG tablet [id number]  Take 500 mg by mouth as needed. [provider]  Active   aspirin 81 MG tablet [id number] Yes Take 81 mg by mouth daily. [provider] Taking Active Self  atorvastatin (LIPITOR) 40 MG tablet [id number] Yes TAKE 1 TABLET BY MOUTH  DAILY Branch, Alphonse Guild, MD Taking Active   B-D ULTRAFINE III SHORT PEN 31G X 8 MM MISC [id number]  USE AS DIRECTED DAILY Fayrene Helper, MD  Active   Blood Glucose Monitoring Suppl (ACCU-CHEK NANO SMARTVIEW) w/Device Drucie Opitz [id number]  TEST 1 TO 2 TIMES DAILY Fayrene Helper, MD  Active   glipiZIDE (GLUCOTROL XL) 5 MG 24 hr tablet [id number] Yes TAKE 3 TABLETS BY MOUTH  DAILY Fayrene Helper, MD Taking Active   Insulin Syringe-Needle U-100 (INSULIN SYRINGE .5CC/30GX5/16") 30G  X 5/16" 0.5 ML MISC [id number]  Use as directed with novolog 70/30 Fayrene Helper, MD  Active   LANTUS SOLOSTAR 100 UNIT/ML Solostar Pen [id number] Yes INJECT SUBCUTANEOUSLY 70  UNITS DAILY  Patient taking differently: Inject 53 Units into the skin at bedtime.   Fayrene Helper, MD Taking Active            Med Note Vernell Barrier Feb 16, 2021  3:08 PM)    Magnesium 250 MG TABS [id number] Yes Take 250 mg by mouth daily. [provider] Taking Active Self  meclizine (ANTIVERT) 25 MG tablet [id number] Yes Take 1 tablet (25 mg total) by mouth 3 (three) times  daily as needed for dizziness. Simpson, Margaret E, MD Taking Active   metoprolol succinate (TOPROL-XL) 50 MG 24 hr tablet [id number] Yes TAKE 1 TABLET BY MOUTH  DAILY WITH OR IMMEDIATELY  AFTER A MEAL Simpson, Margaret E, MD Taking Active   nitroGLYCERIN (NITROSTAT) 0.4 MG SL tablet [id number] No Place 1 tablet (0.4 mg total) under the tongue every 5 (five) minutes as needed for chest pain. Strader, Brittany M, PA-C Unknown Active   nystatin (MYCOSTATIN/NYSTOP) powder [id number] Yes Apply topically between toes [provider] Taking Active   olmesartan (BENICAR) 20 MG tablet [id number] Yes TAKE 1 TABLET BY MOUTH  DAILY Branch, Jonathan F, MD Taking Active   sildenafil (VIAGRA) 100 MG tablet [id number] Yes Take 1 tablet (100 mg total) by mouth daily as needed for erectile dysfunction. Simpson, Margaret E, MD Taking Active   triamcinolone cream (KENALOG) 0.1 % [id number] Yes Apply twice daily to affected areas [provider] Taking Active             Patient Active Problem List   Diagnosis Date Noted   Light headedness 02/12/2021   Asteatotic eczema 12/16/2020   Rash 05/04/2020   Knee pain, right 11/05/2018   Type 2 diabetes mellitus with vascular disease (HCC) 03/05/2018   Annual physical exam 08/15/2016   Hypersomnia with sleep apnea 01/16/2016   Seasonal allergies 01/04/2015   Tubular  adenoma of colon 11/13/2014   ASCVD (arteriosclerotic cardiovascular disease) 08/21/2014   Bilateral hand pain 04/06/2014   Back pain 09/16/2013   Essential hypertension    Hyperlipidemia LDL goal <70    Obesity (BMI 30.0-34.9) 12/24/2011   ED (erectile dysfunction) 02/14/2011   Cardiovascular disease 09/27/2009   TOBACCO ABUSE, HX OF 09/27/2009    Immunization History  Administered Date(s) Administered   Fluad Quad(high Dose 65+) 01/01/2019, 12/17/2020   Influenza Split 02/14/2011, 12/18/2011   Influenza Whole 12/25/2006, 12/23/2008, 01/13/2010   Influenza,inj,Quad PF,6+ Mos 01/10/2013, 12/02/2013, 01/26/2015, 12/09/2015, 11/30/2016, 12/31/2017, 12/13/2019   PFIZER(Purple Top)SARS-COV-2 Vaccination 05/01/2019, 05/23/2019, 01/08/2020, 07/12/2020   Pneumococcal Conjugate-13 11/10/2013   Pneumococcal Polysaccharide-23 03/10/2004, 01/13/2010, 11/30/2016   Tdap 12/18/2011   Zoster Recombinat (Shingrix) 12/17/2019   Zoster, Live 12/18/2011    Conditions to be addressed/monitored: HTN and DMII  Care Plan : Medication Management  Updates made by ,  J, RPH since 02/16/2021 12:00 AM     Problem: T2DM & HTN   Priority: High  Onset Date: 01/11/2021     Long-Range Goal: Disease Progression Prevention   Start Date: 01/11/2021  Expected End Date: 04/11/2021  Recent Progress: On track  Priority: High  Note:   Current Barriers:  Unable to achieve control of diabetes Suboptimal therapeutic regimen for diabetes  Pharmacist Clinical Goal(s):  Patient will achieve control of diabetes as evidenced by improved A1c adhere to plan to optimize therapeutic regimen for diabetes as evidenced by report of adherence to recommended medication management changes through collaboration with PharmD and provider.   Interventions: 1:1 collaboration with Simpson, Margaret E, MD regarding development and update of comprehensive plan of care as evidenced by provider attestation and  co-signature Inter-disciplinary care team collaboration (see longitudinal plan of care) Comprehensive medication review performed; medication list updated in electronic medical record  Type 2 Diabetes - Goal on Track (progressing): YES.: Uncontrolled; Most recent A1c above goal of <7% per ADA guidelines Current medications: glipizide XL 15 mg by mouth with breakfast and insulin glargine (Lantus) 53 units subcutaneously at bedtime Insulin recently decreased due to hypoglycemia overnight Intolerances:    metformin (GI intolerance) Taking medications as directed: yes Side effects thought to be attributed to current medication regimen: no Patient reports some hypoglycemia in the afternoon if he forgets to take his glipizide with his meal and takes it afterwards Hypoglycemia prevention: not indicated at this time Current meal patterns: not discussed today Current exercise:  plays golf most days On a statin: yes On aspirin 81 mg daily: yes Last microalbumin/creatinine ratio: 55 (08/05/20); on an ACEi/ARB: yes Last eye exam: completed within last year Last foot exam: completed within last year Current glucose readings: fasting blood glucose usually 80-100 per patient verbal report. Blood glucose this afternoon in the 200s. The following education was provided: Hypoglycemia: I have discussed with the patient how to treat hypoglycemia by the rule of 15; eat/drink 15g of sugar in the form of glucose tabs, 4 ounces of juice or soda and recheck fingerstick glucose in 15 minutes. Chocolate bars and ice cream should be avoided because fat delays carbohydrate digestion and absorption. Retreat if glucose remains low. Driving should cease until glucose is normal. Instructed to monitor blood sugars twice a day at the following times: 5-15 minutes before breakfast, bedtime, and whenever patient experiences symptoms of hypo/hyperglycemia  Patient identified as a good candidate for a GLP-1 receptor agonist given  reduction in cardiovascular disease, slowed chronic kidney disease progression, low risk of hypoglycemia, increased satiety , and weight loss. Patient denies a personal or family history of medullary thyroid carcinoma (MTC) or Multiple Endocrine Neoplasia syndrome type 2 (MEN 2). Patient also denies any history of pancreatitis or biliary disease. Patient identified as a good candidate for SGLT-2 inhibitor given reduction in cardiovascular disease, slowed chronic kidney disease progression, low risk of hypoglycemia, weight loss, and blood pressure lowering. Patient denies a history of significant genitourinary infections. No concern for hypotension/volume depletion. GFR at least >20 mL/minute/1.73 m2. Continue Lantus 53 units at bedtime and glipizide 15 mg by mouth before breakfast for now Patient identified as a good continuous glucose monitor candidate. Will send prescription for Dexcom G6 to DME supplier. Patient will use his smart phone. Patient instructed to let me know if he needs me to help him place and setup. Patient plans to switch from [employer] to [employer] in 2023. He has 3 months supply of Lantus remaining but would like to switch to Tresiba Flextouch when he runs out.  Will discuss potentially starting Trulicity 0.75 mg subcutaneously weekly at next visit. Medication assistance workup complete and patient has an income above the threshold. Patient reports that he is able to afford the co-pay though. Can switch patient to Mounjaro once added to patient's insurance formulary. Would recommend decreasing or discontinuing glipizide dose upon initiation of GLP-1 agonist.  Hypertension - Condition stable. Not addressed this visit.: Blood pressure under suboptimal control. Blood pressure is above goal of <130/80 mmHg per 2017 AHA/ACC guidelines. Current medications: olmesartan 20 mg by mouth at bedtime and metoprolol succinate 50 mg by mouth at bedtime Intolerances: none Taking medications as directed:  yes Side effects thought to be attributed to current medication regimen: no Current home blood pressure: patient has a blood pressure machine but does not currently check  Encourage dietary sodium restriction/DASH diet Recommend regular aerobic exercise Recommend home blood pressure monitoring to discuss at next visit Discussed need for medication compliance Continue current medications as above for now; however, could consider switching from metoprolol to carvedilol twice daily to improve blood pressure effect since carvedilol has alpha blocking properties that metoprolol does not. Could   also consider adding amlodipine and combine with olmesartan. Calcium channel blockers or thiazides are first line for Black patients   Patient Goals/Self-Care Activities Patient will:  Take medications as prescribed Check blood sugar twice a day at the following times: 5-15 minutes before breakfast, bedtime, and whenever patient experiences symptoms of hypo/hyperglycemia, document, and provide at future appointments Check blood pressure at least once daily, document, and provide at future appointments Target a minimum of 150 minutes of moderate intensity exercise weekly  Follow Up Plan: Telephone follow up appointment with care management team member scheduled for: 03/17/21      Medication Assistance: None required.  Patient affirms current coverage meets needs.  Patient's preferred pharmacy is:  [id number] - [place], [place] - [address] [place] [address] Phone: [phone] Fax: [phone]  [facility] - [place], [place] - [address] [place] [address] Phone: [phone] Fax: [phone]  [id number] - [place], [place] - [address] [facility] [address] [place] [address] Phone: [phone] Fax: [phone]  [facility])  - [place], [place] - [address] [place] [address] Phone: [phone] Fax: [phone]  Follow Up:  Patient agrees to Care Plan and Follow-up.  Plan: Telephone follow up appointment with care management team member scheduled for:  03/17/21   , PharmD Clinical Pharmacist  Primary Care [phone]      

## 2021-02-16 NOTE — Patient Instructions (Signed)
Robert [name],  It was great to talk to you today!  Please call me with any questions or concerns.   Visit Information  Patient Goals/Self-Care Activities Patient will:  Take medications as prescribed Check blood sugar twice a day at the following times: 5-15 minutes before breakfast, bedtime, and whenever patient experiences symptoms of hypo/hyperglycemia, document, and provide at future appointments Check blood pressure at least once daily, document, and provide at future appointments Target a minimum of 150 minutes of moderate intensity exercise weekly  Patient verbalizes understanding of instructions provided today and agrees to view in [place].   Telephone follow up appointment with care management team member scheduled for:03/17/21  Kennon Holter, PharmD Clinical Pharmacist [facility] [phone]  Dexcom G6 Training  1. For help or to see the videos: [email or web address]://www.dexcom.com/training-videos  2. You should have a sensor, a transmitter, and a display device. - Each sensor is good for 10 days - Each transmitter is good for 3 months    3. You will still need your glucose meter - If you take acetaminophen 1000 mg every 6 hours or more - Any time your symptoms do not match the reading on your Dexcom - Any time you don't have a number and an arrow  If you have a MRI or CT scan - The company recommends you not wear the dexcom as it is unknown if the heat and magnetic fields could damage the components. - To get the most out of your session, we advise that you try to schedule your procedure near the end of your sensor session to avoid needing an extra sensor.  4. Change the site where you place the sensor with each new insertion.  If there is adhesive residue left on the skin, you can try applying a household oil like baby oil or coconut oil.  5. Avoid injecting insulin within 3 inches of the sensor.  6. Water. The transmitter is water resistant, but  the receiver is not.  You can still swim, shower, and take a bath.  However, your receiver needs to be in a dry area and within 20 feet of the transmitter to get readings.    7. If you want to use your smartphone: - Download the Dexcom G6 app.  You will need to create a Dexcom account.   - The app and the receiver do not talk to each other.  Therefore, you'll have to set up alerts in the app as well.   8. If you want to share your data with your provider. - Once you have downloaded the Dexcom G6 app, download the Dexcom Clarity app. - If you use the Dexcom G6 app, you will not need to upload data to a computer since the data is automatically sent to [place].   - The clinic will then invite you to share your data with the clinic's account in [place].    Here is how the clinic will create an invitation to send to you:  Click Patients.  Click the patient's name you want to invite.  Click Share data.  Click Print an Invitation to view and print, or Email an Invitation, then enter the patient's email address.  Click Invite, then Close.  9. You can call Dexcom for help: [phone] - For an error message or issues with the sensors: select option 2. - For help with data sharing: select option 3 for Patient Care. They are available Monday through Friday 9 am-8pm andSaturday 10 am-4:30 pm.  10.  The sensor glucose may not always be the same as your blood glucose. Sensor glucose is a glucose estimate that lags behind the blood glucose.

## 2021-02-16 NOTE — Progress Notes (Signed)
  Subjective:  Patient ID: Robert [name], male    DOB: 03-05-1951,  MRN: [id number]  Robert [name] presents to clinic today for preventative diabetic foot care and painful elongated mycotic toenails 1-5 bilaterally which are tender when wearing enclosed shoe gear. Pain is relieved with periodic professional debridement.  Patient states blood glucose was 107 mg/dl today.    He and his wife are celebrating their 50th wedding anniversary this month.  He states he had to cut his left great toe nail because it grew over the toe and split.  Robert [name] states his Dermatologist wrote a prescription for a tube of cream, but it was over $200, so he did not get it filled. He continues to use his triamcinolone cream for his eczema and feels this works fine for him.  He hasn't been able to play golf lately due to vertigo.  He relates one episode of left heel pain which did not last long when playing golf one time. It hasn't persisted.  PCP is Fayrene Helper, MD , and last visit was 02/11/2021.  Allergies  Allergen Reactions   Metformin And Related Other (See Comments)    Stomach upset   Rosuvastatin Other (See Comments)    Adverse GI symptoms   Codeine     "Did not work" and "made me feel foolish"    Review of Systems: Negative except as noted in the HPI. Objective:   Constitutional Robert [name] is a pleasant 70 y.o. African American male, WD, WN in NAD. AAO x 3.   Vascular Capillary refill time to digits immediate b/l. Faintly palpable DP pulses b/l LE. Faintly palpable PT pulse(s) b/l LE. Pedal hair present. No pain with calf compression RLE. Mild clubbing of digits 2-5 b/l. No edema noted b/l LE. No cyanosis or clubbing noted.  Neurologic Normal speech. Oriented to person, place, and time. Protective sensation intact 5/5 intact bilaterally with 10g monofilament b/l. Vibratory sensation intact b/l. Deep tendon reflexes normal b/l.   Dermatologic Pedal skin is warm and supple b/l  LE. No open wounds b/l LE. No interdigital macerations noted b/l LE. Toenails 1-5 b/l elongated, discolored, dystrophic, thickened, crumbly with subungual debris and tenderness to dorsal palpation. Multiple small porokeratotic lesions plantar midfoot and heel b/l. No erythema, no edema, no drainage, no fluctuance..  Orthopedic: Normal muscle strength 5/5 to all lower extremity muscle groups bilaterally. Pes planus deformity noted bilateral LE.   Radiographs: None Assessment:   1. Pain due to onychomycosis of toenails of both feet   2. Porokeratosis   3. Diabetes mellitus type 2 with peripheral artery disease ([place])    Plan:  Patient was evaluated and treated and all questions answered. Consent given for treatment as described below: -Examined patient. -One episode of heel pain left foot. It has not persisted. Monitor. May need inserts for golf shoes. -Continue diabetic foot care principles: inspect feet daily, monitor glucose as recommended by PCP and/or Endocrinologist, and follow prescribed diet per PCP, Endocrinologist and/or dietician. -Toenails 1-5 b/l were debrided in length and girth with sterile nail nippers and dremel without iatrogenic bleeding.  -Painful porokeratotic lesion(s) b/l feet pared and enucleated with sterile scalpel blade without incident. Total number of lesions debrided=4. -Patient/POA to call should there be question/concern in the interim.  Return in about 3 months (around 05/19/2021).  Marzetta Board, DPM

## 2021-02-21 ENCOUNTER — Other Ambulatory Visit: Payer: Self-pay | Admitting: Family Medicine

## 2021-02-22 ENCOUNTER — Other Ambulatory Visit: Payer: Self-pay

## 2021-02-22 ENCOUNTER — Ambulatory Visit (HOSPITAL_COMMUNITY)
Admission: RE | Admit: 2021-02-22 | Discharge: 2021-02-22 | Disposition: A | Discharge status: Home / Self Care | Payer: Medicare Other | Source: Ambulatory Visit | Attending: Family Medicine | Admitting: Family Medicine

## 2021-02-22 DIAGNOSIS — E119 Type 2 diabetes mellitus without complications: Secondary | ICD-10-CM | POA: Diagnosis not present

## 2021-02-22 DIAGNOSIS — R42 Dizziness and giddiness: Secondary | ICD-10-CM | POA: Insufficient documentation

## 2021-02-22 DIAGNOSIS — E785 Hyperlipidemia, unspecified: Secondary | ICD-10-CM | POA: Diagnosis not present

## 2021-02-22 DIAGNOSIS — I6523 Occlusion and stenosis of bilateral carotid arteries: Secondary | ICD-10-CM | POA: Diagnosis not present

## 2021-02-28 DIAGNOSIS — E1159 Type 2 diabetes mellitus with other circulatory complications: Secondary | ICD-10-CM | POA: Diagnosis not present

## 2021-03-02 DIAGNOSIS — I10 Essential (primary) hypertension: Secondary | ICD-10-CM

## 2021-03-02 DIAGNOSIS — Z794 Long term (current) use of insulin: Secondary | ICD-10-CM

## 2021-03-02 DIAGNOSIS — E1169 Type 2 diabetes mellitus with other specified complication: Secondary | ICD-10-CM

## 2021-03-08 ENCOUNTER — Ambulatory Visit: Payer: Medicare Other | Admitting: Pharmacist

## 2021-03-08 ENCOUNTER — Other Ambulatory Visit: Payer: Self-pay

## 2021-03-08 DIAGNOSIS — I10 Essential (primary) hypertension: Secondary | ICD-10-CM

## 2021-03-08 DIAGNOSIS — E1169 Type 2 diabetes mellitus with other specified complication: Secondary | ICD-10-CM

## 2021-03-08 DIAGNOSIS — Z794 Long term (current) use of insulin: Secondary | ICD-10-CM

## 2021-03-08 NOTE — Patient Instructions (Signed)
Robert [name],  It was great to talk to you today!  Please call me with any questions or concerns.   Visit Information  Following are the goals we discussed today:  Patient Goals/Self-Care Activities Patient will:  Take medications as prescribed Check blood sugar continuously with continuous glucose monitor, document, and provide at future appointments Check blood pressure at least once daily, document, and provide at future appointments Target a minimum of 150 minutes of moderate intensity exercise weekly  Plan: Telephone follow up appointment with care management team member scheduled for:  04/06/21  Kennon Holter, PharmD, BCACP, CPP Clinical Pharmacist Practitioner [facility] [phone]   Please call the care guide team at [phone] if you need to cancel or reschedule your appointment.   Patient verbalizes understanding of instructions provided today and agrees to view in [place].

## 2021-03-08 NOTE — Chronic Care Management (AMB) (Signed)
Chronic Care Management Pharmacy Note  03/08/2021 Name:  Robert [name] MRN:  [id number] DOB:  1950/04/23  Summary:  Type 2 Diabetes Current glucose readings: patient has been checking blood glucose 1-2 times per day and brought in blood glucose meter with 7 day average blood glucose of 110, 14 day average blood glucose of 106, and 30 day average blood glucose of 112. Patient does not report recent hypoglycemia since insulin decrease.  Continue Lantus 53 units at bedtime and glipizide 15 mg by mouth before breakfast for now Patient received Dexcom G6 from DME supplier and brought in supplies today. Patient training completed and Dexcom G6 was placed on abdomen in clinic today. Data Sharing via [place] was also setup.  Patient will use his smart phone to monitor blood glucose continuously now. Patient aware to check finger stick blood glucose if symptoms do not match sensor glucose reading. Patient plans to switch from [facility] to [facility] in 2023. He has 3-4 months supply of Lantus remaining but would like to switch to [place] Flextouch when he runs out.  Will discuss potentially starting GLP-1 agonist at next visit and consider discontinuing glipizide to reduce likelihood of hypoglycemia. Medication assistance workup complete and patient has an income above the threshold. Patient reports that he is able to afford the co-pay though. Patient reports he will have repeat A1c next week. Will follow-up to see results  Hypertension - Not addressed this visit Blood pressure under suboptimal control. Blood pressure is above goal of <130/80 mmHg per 2017 AHA/ACC guidelines. Continue current medications as above for now; however, could consider switching from metoprolol to carvedilol twice daily to improve blood pressure effect since carvedilol has alpha blocking properties that metoprolol does not. Could also consider adding amlodipine and combine with olmesartan. Calcium channel blockers or thiazides are  first line for Black patients.   Subjective: Robert [name] is an 70 y.o. year old male who is a primary patient of Moshe Cipro, Norwood Levo, MD.  The CCM team was consulted for assistance with disease management and care coordination needs.    Engaged with patient face to face for follow up visit in response to provider referral for pharmacy case management and/or care coordination services.   Consent to Services:  The patient was given information about Chronic Care Management services, agreed to services, and gave verbal consent prior to initiation of services.  Please see initial visit note for detailed documentation.   Patient Care Team: Fayrene Helper, MD as PCP - General Branch, Alphonse Guild, MD as PCP - Cardiology (Cardiology) Beryle Lathe, [facility] (Pharmacist)  Objective:  Lab Results  Component Value Date   CREATININE 1.21 12/13/2020   CREATININE 1.26 08/05/2020   CREATININE 1.32 (H) 04/22/2020    Lab Results  Component Value Date   HGBA1C 7.9 (H) 12/13/2020   Last diabetic Eye exam:  Lab Results  Component Value Date/Time   HMDIABEYEEXA No Retinopathy 06/23/2020 12:00 AM    Last diabetic Foot exam:  Lab Results  Component Value Date/Time   HMDIABFOOTEX yes 01/13/2010 12:00 AM        Component Value Date/Time   CHOL 120 08/05/2020 0842   TRIG 99 08/05/2020 0842   HDL 46 08/05/2020 0842   CHOLHDL 3.2 01/16/2020 0802   VLDL 17 08/11/2016 0821   LDLCALC 55 08/05/2020 0842   LDLCALC 70 01/16/2020 0802    Hepatic Function Latest Ref Rng & Units 08/05/2020 01/16/2020 08/29/2019  Total Protein 6.0 - 8.5  g/dL 7.1 6.6 7.0  Albumin 3.8 - 4.8 g/dL 4.3 - -  AST 0 - 40 IU/L _0 ALT 0 - 44 IU/L _1 Alk Phosphatase 44 - 121 IU/L 129(H) - -  Total Bilirubin 0.0 - 1.2 mg/dL 0.5 0.4 0.3  Bilirubin, Direct 0.0 - 0.3 mg/dL - - -    Lab Results  Component Value Date/Time   TSH 1.76 11/04/2018 07:51 AM   TSH 1.81 11/21/2017 07:23 AM    CBC Latest  Ref Rng & Units 08/05/2020 01/16/2020 04/02/2018  WBC 3.4 - 10.8 x10E3/uL 6.2 5.1 5.4  Hemoglobin 13.0 - 17.7 g/dL 13.6 13.2 13.2  Hematocrit 37.5 - 51.0 % 40.5 39.7 41.0  Platelets 150 - 450 x10E3/uL 224 228 268    Lab Results  Component Value Date/Time   VD25OH 26 (L) 11/04/2018 07:51 AM   VD25OH 23 (L) 11/21/2017 07:23 AM    Clinical ASCVD: No  The ASCVD Risk score (Arnett DK, et al., 2019) failed to calculate for the following reasons:   The valid total cholesterol range is 130 to 320 mg/dL    Social History   Tobacco Use  Smoking Status Former   Packs/day: 1.00   Years: 30.00   Pack years: 30.00   Types: Cigarettes   Start date: 04/03/1966   Quit date: 09/02/1983   Years since quitting: 37.5  Smokeless Tobacco Never   BP Readings from Last 3 Encounters:  02/11/21 130/72  12/17/20 136/72  09/21/20 (!) 154/87   Pulse Readings from Last 3 Encounters:  02/11/21 77  12/17/20 64  09/21/20 72   Wt Readings from Last 3 Encounters:  02/11/21 226 lb (102.5 kg)  12/17/20 225 lb (102.1 kg)  09/21/20 228 lb (103.4 kg)    Assessment: Review of patient past medical history, allergies, medications, health status, including review of consultants reports, laboratory and other test data, was performed as part of comprehensive evaluation and provision of chronic care management services.   SDOH:  (Social Determinants of Health) assessments and interventions performed:    CCM Care Plan  Allergies  Allergen Reactions   Metformin And Related Other (See Comments)    Stomach upset   Rosuvastatin Other (See Comments)    Adverse GI symptoms   Codeine     "Did not work" and "made me feel foolish"    Medications Reviewed Today     Reviewed by Beryle Lathe, [facility] (Pharmacist) on 03/08/21 at 1338  Med List Status: <None>   Medication Order Taking? Sig Documenting Provider Last Dose Status Informant  ACCU-CHEK FASTCLIX LANCETS MISC [id number]  Use up to three times daily Dx  e11.22 Fayrene Helper, MD  Active   ACCU-CHEK SMARTVIEW test strip [id number]  USE AS INSTRUCTED 3 TIMES  DAILY Fayrene Helper, MD  Active   acetaminophen (TYLENOL) 500 MG tablet [id number] Yes Take 500 mg by mouth as needed. [provider] Taking Active   aspirin 81 MG tablet [id number] Yes Take 81 mg by mouth daily. [provider] Taking Active Self  atorvastatin (LIPITOR) 40 MG tablet [id number] Yes TAKE 1 TABLET BY MOUTH  DAILY Branch, Alphonse Guild, MD Taking Active   B-D ULTRAFINE III SHORT PEN 31G X 8 MM MISC [id number]  USE AS DIRECTED DAILY Fayrene Helper, MD  Active   Blood Glucose Monitoring Suppl (ACCU-CHEK NANO SMARTVIEW) w/Device Drucie Opitz [id number]  TEST 1 TO 2 TIMES DAILY Fayrene Helper, MD  Active   glipiZIDE (  GLUCOTROL XL) 5 MG 24 hr tablet [id number] Yes TAKE 3 TABLETS BY MOUTH  DAILY Fayrene Helper, MD Taking Active   Insulin Syringe-Needle U-100 (INSULIN SYRINGE .5CC/30GX5/16") 30G X 5/16" 0.5 ML MISC [id number]  Use as directed with novolog 70/30 Fayrene Helper, MD  Active   LANTUS SOLOSTAR 100 UNIT/ML Solostar Pen [id number] Yes INJECT SUBCUTANEOUSLY 70  UNITS DAILY  Patient taking differently: Inject 53 Units into the skin at bedtime.   Fayrene Helper, MD Taking Active            Med Note Vernell Barrier Feb 16, 2021  3:08 PM)    Magnesium 250 MG TABS [id number] Yes Take 250 mg by mouth daily. [provider] Taking Active Self  meclizine (ANTIVERT) 25 MG tablet [id number] Yes Take 1 tablet (25 mg total) by mouth 3 (three) times daily as needed for dizziness. Fayrene Helper, MD Taking Active   metoprolol succinate (TOPROL-XL) 50 MG 24 hr tablet [id number] Yes TAKE 1 TABLET BY MOUTH  DAILY WITH OR IMMEDIATELY  AFTER A MEAL Fayrene Helper, MD Taking Active   nitroGLYCERIN (NITROSTAT) 0.4 MG SL tablet [id number] No Place 1 tablet (0.4 mg total) under the tongue every 5 (five) minutes as needed for chest pain.  Ahmed Prima, [place] M, PA-C Unknown Active   nystatin (MYCOSTATIN/NYSTOP) powder [id number] Yes Apply topically between toes [provider] Taking Active   olmesartan (BENICAR) 20 MG tablet [id number] Yes TAKE 1 TABLET BY MOUTH  DAILY Branch, Alphonse Guild, MD Taking Active   sildenafil (VIAGRA) 100 MG tablet [id number] Yes Take 1 tablet (100 mg total) by mouth daily as needed for erectile dysfunction. Fayrene Helper, MD Taking Active   triamcinolone cream (KENALOG) 0.1 % [id number] Yes Apply twice daily to affected areas [provider] Taking Active             Patient Active Problem List   Diagnosis Date Noted   Light headedness 02/12/2021   Asteatotic eczema 12/16/2020   Rash 05/04/2020   Knee pain, right 11/05/2018   Type 2 diabetes mellitus with vascular disease ([place]) 03/05/2018   Annual physical exam 08/15/2016   Hypersomnia with sleep apnea 01/16/2016   Seasonal allergies 01/04/2015   Tubular adenoma of colon 11/13/2014   ASCVD (arteriosclerotic cardiovascular disease) 08/21/2014   Bilateral hand pain 04/06/2014   Back pain 09/16/2013   Essential hypertension    Hyperlipidemia LDL goal <70    Obesity (BMI 30.0-34.9) 12/24/2011   ED (erectile dysfunction) 02/14/2011   Cardiovascular disease 09/27/2009   TOBACCO ABUSE, HX OF 09/27/2009    Immunization History  Administered Date(s) Administered   Fluad Quad(high Dose 65+) 01/01/2019, 12/17/2020   Influenza Split 02/14/2011, 12/18/2011   Influenza Whole 12/25/2006, 12/23/2008, 01/13/2010   Influenza,inj,Quad PF,6+ Mos 01/10/2013, 12/02/2013, 01/26/2015, 12/09/2015, 11/30/2016, 12/31/2017, 12/13/2019   PFIZER(Purple Top)SARS-COV-2 Vaccination 05/01/2019, 05/23/2019, 01/08/2020, 07/12/2020   Pneumococcal Conjugate-13 11/10/2013   Pneumococcal Polysaccharide-23 03/10/2004, 01/13/2010, 11/30/2016   Tdap 12/18/2011   Zoster Recombinat (Shingrix) 12/17/2019   Zoster, Live 12/18/2011    Conditions to be  addressed/monitored: HTN and DMII  Care Plan : Medication Management  Updates made by Beryle Lathe, [place] since 03/08/2021 12:00 AM     Problem: T2DM & HTN   Priority: High  Onset Date: 01/11/2021     Long-Range Goal: Disease Progression Prevention   Start Date: 01/11/2021  Expected End Date: 04/11/2021  Recent Progress: On track  Priority: High  Note:  Current Barriers:  Unable to achieve control of diabetes Suboptimal therapeutic regimen for diabetes  Pharmacist Clinical Goal(s):  Patient will achieve control of diabetes as evidenced by improved A1c adhere to plan to optimize therapeutic regimen for diabetes as evidenced by report of adherence to recommended medication management changes through collaboration with PharmD and provider.   Interventions: 1:1 collaboration with Fayrene Helper, MD regarding development and update of comprehensive plan of care as evidenced by provider attestation and co-signature Inter-disciplinary care team collaboration (see longitudinal plan of care) Comprehensive medication review performed; medication list updated in electronic medical record  Type 2 Diabetes - Goal on Track (progressing): YES.: Uncontrolled; Most recent A1c above goal of <7% per ADA guidelines Current medications: glipizide XL 15 mg by mouth with breakfast and insulin glargine (Lantus) 53 units subcutaneously at bedtime Insulin decreased a few weeks ago due to hypoglycemia overnight Intolerances:  metformin (GI intolerance) Taking medications as directed: yes Side effects thought to be attributed to current medication regimen: no Hypoglycemia prevention: not indicated at this time Current meal patterns: not discussed today Current exercise:  plays golf most days On a statin: yes On aspirin 81 mg daily: yes Last microalbumin/creatinine ratio: 55 (08/05/20); on an ACEi/ARB: yes Last eye exam: completed within last year Last foot exam: completed within last  year Current glucose readings: patient has been checking blood glucose 1-2 times per day and brought in blood glucose meter with 7 day average blood glucose of 110, 14 day average blood glucose of 106, and 30 day average blood glucose of 112. Patient does not report recent hypoglycemia since insulin decrease.  The following education was previously provided: Hypoglycemia: I have discussed with the patient how to treat hypoglycemia by the rule of 15; eat/drink 15g of sugar in the form of glucose tabs, 4 ounces of juice or soda and recheck fingerstick glucose in 15 minutes. Chocolate bars and ice cream should be avoided because fat delays carbohydrate digestion and absorption. Retreat if glucose remains low. Driving should cease until glucose is normal. Instructed to monitor blood sugars continuously with continuous glucose monitor  Patient identified as a good candidate for a GLP-1 receptor agonist given reduction in cardiovascular disease, slowed chronic kidney disease progression, low risk of hypoglycemia, increased satiety , and weight loss. Patient denies a personal or family history of medullary thyroid carcinoma (MTC) or Multiple Endocrine Neoplasia syndrome type 2 (MEN 2). Patient also denies any history of pancreatitis or biliary disease.   Patient identified as a good candidate for SGLT-2 inhibitor given reduction in cardiovascular disease, slowed chronic kidney disease progression, low risk of hypoglycemia, weight loss, and blood pressure lowering. Patient denies a history of significant genitourinary infections.  No concern for hypotension/volume depletion.  GFR at least >20 mL/minute/1.73 m2.   Continue Lantus 53 units at bedtime and glipizide 15 mg by mouth before breakfast for now Patient received Dexcom G6 from DME supplier and brought in supplies today. Patient training completed and Dexcom G6 was placed on abdomen in clinic today. Data Sharing via [place] was also setup.  Patient will  use his smart phone to monitor blood glucose continuously now. Patient aware to check finger stick blood glucose if symptoms do not match sensor glucose reading. Patient plans to switch from [facility] to [facility] in 2023. He has 3-4 months supply of Lantus remaining but would like to switch to [place] Flextouch when he runs out.  Will discuss potentially starting GLP-1 agonist at next visit and consider discontinuing  glipizide to reduce likelihood of hypoglycemia. Medication assistance workup complete and patient has an income above the threshold. Patient reports that he is able to afford the co-pay though. Patient reports he will have repeat A1c next week. Will follow-up to see results  Hypertension - Condition stable. Not addressed this visit.: Blood pressure under suboptimal control. Blood pressure is above goal of <130/80 mmHg per 2017 AHA/ACC guidelines. Current medications: olmesartan 20 mg by mouth at bedtime and metoprolol succinate 50 mg by mouth at bedtime Intolerances: none Taking medications as directed: yes Side effects thought to be attributed to current medication regimen: no Current home blood pressure: patient has a blood pressure machine but does not currently check  Encourage dietary sodium restriction/DASH diet Recommend regular aerobic exercise Recommend home blood pressure monitoring to discuss at next visit Discussed need for medication compliance Continue current medications as above for now; however, could consider switching from metoprolol to carvedilol twice daily to improve blood pressure effect since carvedilol has alpha blocking properties that metoprolol does not. Could also consider adding amlodipine and combine with olmesartan. Calcium channel blockers or thiazides are first line for Black patients.   Patient Goals/Self-Care Activities Patient will:  Take medications as prescribed Check blood sugar continuously with continuous glucose monitor, document, and provide at  future appointments Check blood pressure at least once daily, document, and provide at future appointments Target a minimum of 150 minutes of moderate intensity exercise weekly  Follow Up Plan: Telephone follow up appointment with care management team member scheduled for: 04/06/21      Medication Assistance: None required.  Patient affirms current coverage meets needs.  Patient's preferred pharmacy is:  [facility] [address] [place] - [place] [address] [place] [address] Phone: [phone] Fax: [phone]  [facility] ([place], [place] [address] [place] [address] [place] [address] Phone: [phone] Fax: [phone]  [id number] [place]Phone: [phone]: [phone] [facility] ) - [address] [address]Phone: [phone]: [phone] Follow Up:  Patient agrees to Care Plan and Follow-up.  Plan: Telephone follow up appointment with care management team member scheduled for:  04/06/21  CKennon Holter PharmD, [place] CAllenClinical Pharmacist Practitioner [facility] [phone]

## 2021-03-15 DIAGNOSIS — E785 Hyperlipidemia, unspecified: Secondary | ICD-10-CM | POA: Diagnosis not present

## 2021-03-15 DIAGNOSIS — R7302 Impaired glucose tolerance (oral): Secondary | ICD-10-CM | POA: Diagnosis not present

## 2021-03-15 DIAGNOSIS — I10 Essential (primary) hypertension: Secondary | ICD-10-CM | POA: Diagnosis not present

## 2021-03-16 ENCOUNTER — Encounter: Payer: Self-pay | Admitting: Pharmacist

## 2021-03-16 LAB — CMP14+EGFR
ALT: 19 IU/L (ref 0–44)
AST: 15 IU/L (ref 0–40)
Albumin/Globulin Ratio: 1.5 (ref 1.2–2.2)
Albumin: 4.1 g/dL (ref 3.8–4.8)
Alkaline Phosphatase: 141 IU/L — ABNORMAL HIGH (ref 44–121)
BUN/Creatinine Ratio: 12 (ref 10–24)
BUN: 16 mg/dL (ref 8–27)
Bilirubin Total: 0.4 mg/dL (ref 0.0–1.2)
CO2: 22 mmol/L (ref 20–29)
Calcium: 9.4 mg/dL (ref 8.6–10.2)
Chloride: 101 mmol/L (ref 96–106)
Creatinine, Ser: 1.31 mg/dL — ABNORMAL HIGH (ref 0.76–1.27)
Globulin, Total: 2.7 g/dL (ref 1.5–4.5)
Glucose: 104 mg/dL — ABNORMAL HIGH (ref 70–99)
Potassium: 4.7 mmol/L (ref 3.5–5.2)
Sodium: 138 mmol/L (ref 134–144)
Total Protein: 6.8 g/dL (ref 6.0–8.5)
eGFR: 59 mL/min/{1.73_m2} — ABNORMAL LOW (ref >59)

## 2021-03-16 LAB — LIPID PANEL
Chol/HDL Ratio: 3.2 ratio (ref 0.0–5.0)
Cholesterol, Total: 114 mg/dL (ref 100–199)
HDL: 36 mg/dL — ABNORMAL LOW (ref >39)
LDL Chol Calc (NIH): 61 mg/dL (ref 0–99)
Triglycerides: 84 mg/dL (ref 0–149)
VLDL Cholesterol Cal: 17 mg/dL (ref 5–40)

## 2021-03-16 LAB — HEMOGLOBIN A1C
Est. average glucose Bld gHb Est-mCnc: 174 mg/dL
Hgb A1c MFr Bld: 7.7 % — ABNORMAL HIGH (ref 4.8–5.6)

## 2021-03-16 LAB — TSH: TSH: 1.42 u[IU]/mL (ref 0.450–4.500)

## 2021-03-16 LAB — PSA: Prostate Specific Ag, Serum: 3.5 ng/mL (ref 0.0–4.0)

## 2021-03-17 ENCOUNTER — Telehealth: Payer: Medicare Other

## 2021-03-31 DIAGNOSIS — E1159 Type 2 diabetes mellitus with other circulatory complications: Secondary | ICD-10-CM | POA: Diagnosis not present

## 2021-04-06 ENCOUNTER — Other Ambulatory Visit (HOSPITAL_COMMUNITY): Payer: Self-pay

## 2021-04-06 ENCOUNTER — Ambulatory Visit (INDEPENDENT_AMBULATORY_CARE_PROVIDER_SITE_OTHER): Payer: HMO | Admitting: Pharmacist

## 2021-04-06 DIAGNOSIS — I10 Essential (primary) hypertension: Secondary | ICD-10-CM

## 2021-04-06 DIAGNOSIS — E1169 Type 2 diabetes mellitus with other specified complication: Secondary | ICD-10-CM

## 2021-04-06 DIAGNOSIS — Z794 Long term (current) use of insulin: Secondary | ICD-10-CM

## 2021-04-06 MED ORDER — TRULICITY 1.5 MG/0.5ML ~~LOC~~ SOAJ
1.5000 mg | SUBCUTANEOUS | 1 refills | Status: DC
  Filled 2021-04-06: qty 6, 84d supply, fill #0
  Filled 2021-05-05: qty 2, 28d supply, fill #0
  Filled 2021-06-08: qty 2, 28d supply, fill #1
  Filled 2021-06-26 – 2021-06-30 (×2): qty 2, 28d supply, fill #2
  Filled 2021-07-21: qty 2, 28d supply, fill #3
  Filled 2021-08-24: qty 2, 28d supply, fill #4
  Filled 2021-09-17: qty 2, 28d supply, fill #5

## 2021-04-06 MED ORDER — TRULICITY 0.75 MG/0.5ML ~~LOC~~ SOAJ
0.7500 mg | SUBCUTANEOUS | 0 refills | Status: AC
  Filled 2021-04-06: qty 2, 28d supply, fill #0

## 2021-04-06 NOTE — Patient Instructions (Addendum)
Robert [name],  It was great to talk to you today!  Summary of changes we made today: Continue lowered dose of Lantus 50 units subcutaneously at bedtime Start Trulicity 4.33 mg subcutaneously once weekly x4 weeks (4 doses) then increase to 1.5 mg subcutaneously once weekly. See step by step instruction below for how to inject Trulicity.  Discontinue glipizide when you start taking Trulicity  Please call me with any questions or concerns.   Visit Information  Following are the goals we discussed today:  Patient Goals/Self-Care Activities Patient will:  Take medications as prescribed Check blood sugar continuously with continuous glucose monitor, document, and provide at future appointments Check blood pressure at least once daily, document, and provide at future appointments Target a minimum of 150 minutes of moderate intensity exercise weekly  Plan: Telephone follow up appointment with care management team member scheduled for:  05/11/21  Kennon Holter, PharmD, BCACP, CPP Clinical Pharmacist Practitioner [facility] [phone]  Please call the care guide team at [phone] if you need to cancel or reschedule your appointment.   Patient verbalizes understanding of instructions provided today and agrees to view in [place].    Trulicity (dulaglutide)   What is this medicine used for: Used to lower your blood sugar and treat your diabetes  How is this medicine supplied? One pen is for one use only Each box contains 4 pens  How to take the medicine:  Preparing the pen before each use: Remove the pen from the refrigerator Make sure the pen is locked Hold the pen straight up and pull the gray base cap straight off. Throw the base cap away in your trash can. Using the pen and injecting your dose: Clean the injection site with an alcohol swab or soap and water. Do not touch the injection site after cleaning. Place the clear base firmly against the skin on  your stomach, thigh, or upper arm Unlock the pen by turning the [employer] to the unlock symbol. Press down and hold on the green injection button. You will hear a loud click. Continue holding the pen against your skin until you hear a second click (about 5 to 10 seconds) Remove the pen from your skin  Immediately throw away the pen by placing in a puncture-resistant/sharps container The injection is given once per week. Use a different injection site each week.  Most common side effects: Nausea Diarrhea Vomiting Decreased appetite  Storage: Store unused pens in the refrigerator. Do not freeze. Your pen can be stored at room temperature for up to a total of 14 days.

## 2021-04-07 ENCOUNTER — Other Ambulatory Visit (HOSPITAL_COMMUNITY): Payer: Self-pay

## 2021-04-07 NOTE — Chronic Care Management (AMB) (Signed)
Chronic Care Management Pharmacy Note  04/07/2021 Name:  Robert [name] MRN:  [id number] DOB:  December 26, 1950  Summary: Type 2 Diabetes Uncontrolled; Most recent A1c above goal of <7% per ADA guidelines Current medications: glipizide XL 15 mg by mouth with breakfast and insulin glargine (Lantus) 53 units subcutaneously at bedtime Taking medications as directed: no, patient self-decreased Lantus to 50 units subcutaneously at bedtime due to hypoglycemia Current glucose readings: see Dexcom Clarity report below. Average blood glucose 136 over last 40 days with 85% time in target range, 13% above target range, and 2% below target range. A few overnight hypoglycemia events noted but appear to be very mild and occurred less once patient decreased basal insulin dose. Will continue to watch blood glucose closely and make adjustments as necessary.  Patient identified as a good candidate for a GLP-1 receptor agonist given reduction in cardiovascular disease, slowed chronic kidney disease progression, low risk of hypoglycemia, increased satiety , and weight loss. Patient denies a personal or family history of medullary thyroid carcinoma (MTC) or Multiple Endocrine Neoplasia syndrome type 2 (MEN 2). Patient also denies any history of pancreatitis or biliary disease.   Continue self-decreased dose of Lantus 50 units at bedtime Discontinue glipizide to reduce risk of hypoglycemia and start Trulicity 7.00 mg subcutaneously once weekly x4 weeks then increase to 1.5 mg subcutaneously once weekly. Anticipate that this will help reduce post-prandial blood glucose spikes. Prescription sent to [facility] today per patient request. Patient can get 90 day supply from mail order pharmacy for $90 which he reports he can afford at this time.  Patient is using Dexcom G6 to monitor blood glucose. Patient training completed with me at prior visit. Data Sharing via [place] has been setup. Patient is using his smart phone to  monitor blood glucose continuously now. Patient aware to check finger stick blood glucose if symptoms do not match sensor glucose reading.     Hypertension Continue current medications as above for now; however, could consider switching from metoprolol to carvedilol twice daily to improve blood pressure effect since carvedilol has alpha blocking properties that metoprolol does not. Could also consider adding amlodipine and combine with olmesartan. Calcium channel blockers or thiazides are first line for Black patients.   Subjective: Robert [name] is an 71 y.o. year old male who is a primary patient of Moshe Cipro, Norwood Levo, MD.  The CCM team was consulted for assistance with disease management and care coordination needs.    Engaged with patient by telephone for follow up visit in response to provider referral for pharmacy case management and/or care coordination services.   Consent to Services:  The patient was given information about Chronic Care Management services, agreed to services, and gave verbal consent prior to initiation of services.  Please see initial visit note for detailed documentation.   Patient Care Team: Fayrene Helper, MD as PCP - General Branch, Alphonse Guild, MD as PCP - Cardiology (Cardiology) Beryle Lathe, [facility] (Pharmacist)  Objective:  Lab Results  Component Value Date   CREATININE 1.31 (H) 03/15/2021   CREATININE 1.21 12/13/2020   CREATININE 1.26 08/05/2020    Lab Results  Component Value Date   HGBA1C 7.7 (H) 03/15/2021   Last diabetic Eye exam:  Lab Results  Component Value Date/Time   HMDIABEYEEXA No Retinopathy 06/23/2020 12:00 AM    Last diabetic Foot exam:  Lab Results  Component Value Date/Time   HMDIABFOOTEX yes 01/13/2010 12:00 AM  Component Value Date/Time   CHOL 114 03/15/2021 1058   TRIG 84 03/15/2021 1058   HDL 36 (L) 03/15/2021 1058   CHOLHDL 3.2 03/15/2021 1058   CHOLHDL 3.2 01/16/2020 0802   VLDL 17  08/11/2016 0821   LDLCALC 61 03/15/2021 1058   LDLCALC 70 01/16/2020 0802    Hepatic Function Latest Ref Rng & Units 03/15/2021 08/05/2020 01/16/2020  Total Protein 6.0 - 8.5 g/dL 6.8 7.1 6.6  Albumin 3.8 - 4.8 g/dL 4.1 4.3 -  AST 0 - 40 IU/L _0 ALT 0 - 44 IU/L _1 Alk Phosphatase 44 - 121 IU/L 141(H) 129(H) -  Total Bilirubin 0.0 - 1.2 mg/dL 0.4 0.5 0.4  Bilirubin, Direct 0.0 - 0.3 mg/dL - - -    Lab Results  Component Value Date/Time   TSH 1.420 03/15/2021 10:58 AM   TSH 1.76 11/04/2018 07:51 AM    CBC Latest Ref Rng & Units 08/05/2020 01/16/2020 04/02/2018  WBC 3.4 - 10.8 x10E3/uL 6.2 5.1 5.4  Hemoglobin 13.0 - 17.7 g/dL 13.6 13.2 13.2  Hematocrit 37.5 - 51.0 % 40.5 39.7 41.0  Platelets 150 - 450 x10E3/uL 224 228 268    Lab Results  Component Value Date/Time   VD25OH 26 (L) 11/04/2018 07:51 AM   VD25OH 23 (L) 11/21/2017 07:23 AM    Clinical ASCVD: No  The ASCVD Risk score (Arnett DK, et al., 2019) failed to calculate for the following reasons:   The valid total cholesterol range is 130 to 320 mg/dL    Social History   Tobacco Use  Smoking Status Former   Packs/day: 1.00   Years: 30.00   Pack years: 30.00   Types: Cigarettes   Start date: 04/03/1966   Quit date: 09/02/1983   Years since quitting: 37.6  Smokeless Tobacco Never   BP Readings from Last 3 Encounters:  02/11/21 130/72  12/17/20 136/72  09/21/20 (!) 154/87   Pulse Readings from Last 3 Encounters:  02/11/21 77  12/17/20 64  09/21/20 72   Wt Readings from Last 3 Encounters:  02/11/21 226 lb (102.5 kg)  12/17/20 225 lb (102.1 kg)  09/21/20 228 lb (103.4 kg)    Assessment: Review of patient past medical history, allergies, medications, health status, including review of consultants reports, laboratory and other test data, was performed as part of comprehensive evaluation and provision of chronic care management services.   SDOH:  (Social Determinants of Health) assessments and  interventions performed:    CCM Care Plan  Allergies  Allergen Reactions   Metformin And Related Other (See Comments)    Stomach upset   Rosuvastatin Other (See Comments)    Adverse GI symptoms   Codeine     "Did not work" and "made me feel foolish"    Medications Reviewed Today     Reviewed by Beryle Lathe, [facility] (Pharmacist) on 04/06/21 at 1425  Med List Status: <None>   Medication Order Taking? Sig Documenting Provider Last Dose Status Informant  ACCU-CHEK FASTCLIX LANCETS MISC [id number]  Use up to three times daily Dx e11.22 Fayrene Helper, MD  Active   ACCU-CHEK SMARTVIEW test strip [id number]  USE AS INSTRUCTED 3 TIMES  DAILY Fayrene Helper, MD  Active   acetaminophen (TYLENOL) 500 MG tablet [id number] Yes Take 500 mg by mouth as needed. [provider] Taking Active   aspirin 81 MG tablet [id number] Yes Take 81 mg by mouth daily. [provider] Taking Active Self  atorvastatin (  LIPITOR) 40 MG tablet [id number] Yes TAKE 1 TABLET BY MOUTH  DAILY Branch, Alphonse Guild, MD Taking Active   B-D ULTRAFINE III SHORT PEN 31G X 8 MM MISC [id number]  USE AS DIRECTED DAILY Fayrene Helper, MD  Active   Blood Glucose Monitoring Suppl (ACCU-CHEK NANO SMARTVIEW) w/Device Drucie Opitz [id number]  TEST 1 TO 2 TIMES DAILY Fayrene Helper, MD  Active   glipiZIDE (GLUCOTROL XL) 5 MG 24 hr tablet [id number] Yes TAKE 3 TABLETS BY MOUTH  DAILY Fayrene Helper, MD Taking Active   Insulin Syringe-Needle U-100 (INSULIN SYRINGE .5CC/30GX5/16") 30G X 5/16" 0.5 ML MISC [id number]  Use as directed with novolog 70/30 Fayrene Helper, MD  Active   LANTUS SOLOSTAR 100 UNIT/ML Solostar Pen [id number] Yes INJECT SUBCUTANEOUSLY 70  UNITS DAILY  Patient taking differently: Inject 50 Units into the skin at bedtime.   Fayrene Helper, MD Taking Active            Med Note Vernell Barrier Feb 16, 2021  3:08 PM)    Magnesium 250 MG TABS [id number] Yes Take 250 mg by  mouth daily. [provider] Taking Active Self  meclizine (ANTIVERT) 25 MG tablet [id number] Yes Take 1 tablet (25 mg total) by mouth 3 (three) times daily as needed for dizziness. Fayrene Helper, MD Taking Active   metoprolol succinate (TOPROL-XL) 50 MG 24 hr tablet [id number] Yes TAKE 1 TABLET BY MOUTH  DAILY WITH OR IMMEDIATELY  AFTER A MEAL Fayrene Helper, MD Taking Active   nitroGLYCERIN (NITROSTAT) 0.4 MG SL tablet [id number]  Place 1 tablet (0.4 mg total) under the tongue every 5 (five) minutes as needed for chest pain. Ahmed Prima, [place] M, PA-C  Active   nystatin (MYCOSTATIN/NYSTOP) powder [id number] Yes Apply topically between toes [provider] Taking Active   olmesartan (BENICAR) 20 MG tablet [id number] Yes TAKE 1 TABLET BY MOUTH  DAILY Branch, Alphonse Guild, MD Taking Active   sildenafil (VIAGRA) 100 MG tablet [id number] Yes Take 1 tablet (100 mg total) by mouth daily as needed for erectile dysfunction. Fayrene Helper, MD Taking Active   triamcinolone cream (KENALOG) 0.1 % [id number] Yes Apply twice daily to affected areas [provider] Taking Active             Patient Active Problem List   Diagnosis Date Noted   Light headedness 02/12/2021   Asteatotic eczema 12/16/2020   Rash 05/04/2020   Knee pain, right 11/05/2018   Type 2 diabetes mellitus with vascular disease ([place]) 03/05/2018   Annual physical exam 08/15/2016   Hypersomnia with sleep apnea 01/16/2016   Seasonal allergies 01/04/2015   Tubular adenoma of colon 11/13/2014   ASCVD (arteriosclerotic cardiovascular disease) 08/21/2014   Bilateral hand pain 04/06/2014   Back pain 09/16/2013   Essential hypertension    Hyperlipidemia LDL goal <70    Obesity (BMI 30.0-34.9) 12/24/2011   ED (erectile dysfunction) 02/14/2011   Cardiovascular disease 09/27/2009   TOBACCO ABUSE, HX OF 09/27/2009    Immunization History  Administered Date(s) Administered   Fluad Quad(high Dose 65+)  01/01/2019, 12/17/2020   Influenza Split 02/14/2011, 12/18/2011   Influenza Whole 12/25/2006, 12/23/2008, 01/13/2010   Influenza,inj,Quad PF,6+ Mos 01/10/2013, 12/02/2013, 01/26/2015, 12/09/2015, 11/30/2016, 12/31/2017, 12/13/2019   PFIZER(Purple Top)SARS-COV-2 Vaccination 05/01/2019, 05/23/2019, 01/08/2020, 07/12/2020   Pneumococcal Conjugate-13 11/10/2013   Pneumococcal Polysaccharide-23 03/10/2004, 01/13/2010, 11/30/2016   Tdap 12/18/2011   Zoster Recombinat (Shingrix) 12/17/2019   Zoster, Live 12/18/2011  Conditions to be addressed/monitored: HTN and DMII  Care Plan : Medication Management  Updates made by Beryle Lathe, [place] since 04/07/2021 12:00 AM     Problem: T2DM & HTN   Priority: High  Onset Date: 01/11/2021     Long-Range Goal: Disease Progression Prevention   Start Date: 01/11/2021  Expected End Date: 04/11/2021  Recent Progress: On track  Priority: High  Note:   Current Barriers:  Unable to achieve control of diabetes Suboptimal therapeutic regimen for diabetes  Pharmacist Clinical Goal(s):  Through collaboration with PharmD and provider, patient will:  achieve control of diabetes as evidenced by improved A1c adhere to plan to optimize therapeutic regimen for diabetes as evidenced by report of adherence to recommended medication management changes   Interventions: 1:1 collaboration with Fayrene Helper, MD regarding development and update of comprehensive plan of care as evidenced by provider attestation and co-signature Inter-disciplinary care team collaboration (see longitudinal plan of care) Comprehensive medication review performed; medication list updated in electronic medical record  Type 2 Diabetes - Goal on Track (progressing): YES.: Uncontrolled; Most recent A1c above goal of <7% per ADA guidelines Current medications: glipizide XL 15 mg by mouth with breakfast and insulin glargine (Lantus) 53 units subcutaneously at bedtime Insulin  decreased a few weeks ago due to hypoglycemia overnight Intolerances:  metformin (GI intolerance) Taking medications as directed: no, patient self-decreased Lantus to 50 units subcutaneously at bedtime due to hypoglycemia Side effects thought to be attributed to current medication regimen: no Hypoglycemia prevention: not indicated at this time Current meal patterns: not discussed today Current exercise:  plays golf most days On a statin: yes On aspirin 81 mg daily: yes Last microalbumin/creatinine ratio: 55 (08/05/20); on an ACEi/ARB: yes Last eye exam: completed within last year Last foot exam: completed within last year Current glucose readings: see Dexcom Clarity report below. Average blood glucose 136 over last 40 days with 85% time in target range, 13% above target range, and 2% below target range. A few overnight hypoglycemia events noted but appear to be very mild and occurred less once patient decreased basal insulin dose. Will continue to watch blood glucose closely and make adjustments as necessary.  The following education was previously provided: Hypoglycemia: I have discussed with the patient how to treat hypoglycemia by the rule of 15; eat/drink 15g of sugar in the form of glucose tabs, 4 ounces of juice or soda and recheck fingerstick glucose in 15 minutes. Chocolate bars and ice cream should be avoided because fat delays carbohydrate digestion and absorption. Retreat if glucose remains low. Driving should cease until glucose is normal. Instructed to monitor blood sugars continuously with continuous glucose monitor  Patient identified as a good candidate for a GLP-1 receptor agonist given reduction in cardiovascular disease, slowed chronic kidney disease progression, low risk of hypoglycemia, increased satiety , and weight loss. Patient denies a personal or family history of medullary thyroid carcinoma (MTC) or Multiple Endocrine Neoplasia syndrome type 2 (MEN 2). Patient also denies any  history of pancreatitis or biliary disease.   Patient identified as a good candidate for SGLT-2 inhibitor given reduction in cardiovascular disease, slowed chronic kidney disease progression, low risk of hypoglycemia, weight loss, and blood pressure lowering. Patient denies a history of significant genitourinary infections.  No concern for hypotension/volume depletion.  GFR at least >20 mL/minute/1.73 m2.   Continue self-decreased dose of Lantus 50 units at bedtime Discontinue glipizide to reduce risk of hypoglycemia and start Trulicity 8.52 mg subcutaneously once  weekly x4 weeks then increase to 1.5 mg subcutaneously once weekly. Anticipate that this will help reduce post-prandial blood glucose spikes. Prescription sent to [facility] today per patient request. Patient can get 90 day supply from mail order pharmacy for $90 which he reports he can afford at this time.  Patient is using Dexcom G6 to monitor blood glucose. Patient training completed with me at prior visit. Data Sharing via [place] has been setup. Patient is using his smart phone to monitor blood glucose continuously now. Patient aware to check finger stick blood glucose if symptoms do not match sensor glucose reading. Patient reports that he has plans ~6 month supply of Lantus remaining but would like to switch to [place] Flextouch when he runs out.     Hypertension - Condition stable. Not addressed this visit.: Blood pressure under suboptimal control. Blood pressure is above goal of <130/80 mmHg per 2017 AHA/ACC guidelines. Current medications: olmesartan 20 mg by mouth at bedtime and metoprolol succinate 50 mg by mouth at bedtime Intolerances: none Taking medications as directed: yes Side effects thought to be attributed to current medication regimen: no Current home blood pressure: patient has a blood pressure machine but does not currently check  Encourage dietary sodium restriction/DASH diet Recommend regular aerobic  exercise Recommend home blood pressure monitoring to discuss at next visit Discussed need for medication compliance Continue current medications as above for now; however, could consider switching from metoprolol to carvedilol twice daily to improve blood pressure effect since carvedilol has alpha blocking properties that metoprolol does not. Could also consider adding amlodipine and combine with olmesartan. Calcium channel blockers or thiazides are first line for Black patients.   Patient Goals/Self-Care Activities Patient will:  Take medications as prescribed Check blood sugar continuously with continuous glucose monitor, document, and provide at future appointments Check blood pressure at least once daily, document, and provide at future appointments Target a minimum of 150 minutes of moderate intensity exercise weekly  Follow Up Plan: Telephone follow up appointment with care management team member scheduled for: 05/11/21      Medication Assistance: None required.  Patient affirms current coverage meets needs.  Patient's preferred pharmacy is:  [place], [place] - [place] [address] [place] [address] Phone: [phone] Fax: [phone]  [facility] ([place], [facility] [address] [place] [address] Phone: [phone] Fax: [phone]  [id number] [address] [facility]Phone: [phone]: [phone] [facility] ([facility] ) - [place] [address]Phone: [phone]: [address]Phone: [phone]: [phone] Follow Up:  Patient agrees to Care Plan and Follow-up.  Plan: Telephone follow up  appointment with care management team member scheduled for:  05/11/21  CKennon Holter PharmD, [place]am[place] CWest UnionClinical Pharmacist Practitioner RSabine County HospitalPrimary Care [phone]

## 2021-04-21 DIAGNOSIS — R21 Rash and other nonspecific skin eruption: Secondary | ICD-10-CM | POA: Diagnosis not present

## 2021-04-21 DIAGNOSIS — L308 Other specified dermatitis: Secondary | ICD-10-CM | POA: Diagnosis not present

## 2021-05-03 ENCOUNTER — Telehealth: Payer: Self-pay | Admitting: Family Medicine

## 2021-05-03 DIAGNOSIS — I10 Essential (primary) hypertension: Secondary | ICD-10-CM | POA: Diagnosis not present

## 2021-05-03 DIAGNOSIS — E1169 Type 2 diabetes mellitus with other specified complication: Secondary | ICD-10-CM | POA: Diagnosis not present

## 2021-05-03 DIAGNOSIS — Z794 Long term (current) use of insulin: Secondary | ICD-10-CM

## 2021-05-03 NOTE — Telephone Encounter (Signed)
Caryl Pina with [employer] called on pt behalf    Needs to know if pt  has / has had diabetes or chronic heart failure    Call back info   [place] w/ [employer]  [phone]

## 2021-05-05 ENCOUNTER — Other Ambulatory Visit: Payer: Self-pay | Admitting: Family Medicine

## 2021-05-05 ENCOUNTER — Other Ambulatory Visit (HOSPITAL_COMMUNITY): Payer: Self-pay

## 2021-05-05 MED ORDER — TRIAMCINOLONE ACETONIDE 0.1 % EX CREA
TOPICAL_CREAM | Freq: Two times a day (BID) | CUTANEOUS | 1 refills | Status: DC
  Filled 2021-05-05: qty 30, 15d supply, fill #0

## 2021-05-05 MED ORDER — METOPROLOL SUCCINATE ER 50 MG PO TB24
ORAL_TABLET | ORAL | 3 refills | Status: DC
  Filled 2021-05-05: qty 90, 90d supply, fill #0

## 2021-05-05 MED ORDER — UNIFINE PENTIPS 31G X 8 MM MISC
0 refills | Status: DC
  Filled 2021-05-05: qty 100, 90d supply, fill #0

## 2021-05-05 MED ORDER — GLIPIZIDE ER 5 MG PO TB24: ORAL_TABLET | ORAL | 5 refills | Status: DC

## 2021-05-05 MED ORDER — PIMECROLIMUS 1 % EX CREA
TOPICAL_CREAM | CUTANEOUS | 1 refills | Status: DC
  Filled 2021-05-05: qty 100, 30d supply, fill #0

## 2021-05-05 NOTE — Telephone Encounter (Signed)
Spoke with [employer] and told them he had type 11 diabetes and cardiovascular disease.

## 2021-05-06 ENCOUNTER — Other Ambulatory Visit (HOSPITAL_COMMUNITY): Payer: Self-pay

## 2021-05-07 ENCOUNTER — Other Ambulatory Visit (HOSPITAL_COMMUNITY): Payer: Self-pay

## 2021-05-09 ENCOUNTER — Other Ambulatory Visit (HOSPITAL_COMMUNITY): Payer: Self-pay

## 2021-05-10 ENCOUNTER — Other Ambulatory Visit (HOSPITAL_COMMUNITY): Payer: Self-pay

## 2021-05-11 ENCOUNTER — Ambulatory Visit (INDEPENDENT_AMBULATORY_CARE_PROVIDER_SITE_OTHER): Payer: HMO | Admitting: Pharmacist

## 2021-05-11 ENCOUNTER — Other Ambulatory Visit (HOSPITAL_COMMUNITY): Payer: Self-pay

## 2021-05-11 DIAGNOSIS — Z794 Long term (current) use of insulin: Secondary | ICD-10-CM

## 2021-05-11 DIAGNOSIS — I10 Essential (primary) hypertension: Secondary | ICD-10-CM

## 2021-05-11 DIAGNOSIS — E1169 Type 2 diabetes mellitus with other specified complication: Secondary | ICD-10-CM

## 2021-05-11 MED ORDER — LANTUS SOLOSTAR 100 UNIT/ML ~~LOC~~ SOPN: 40.0000 [IU] | PEN_INJECTOR | Freq: Every day | SUBCUTANEOUS | 3 refills | Status: DC

## 2021-05-11 NOTE — Chronic Care Management (AMB) (Signed)
Chronic Care Management Pharmacy Note  05/11/2021 Name:  Robert [name] MRN:  [id number] DOB:  [birth year]  Summary: Type 2 Diabetes Uncontrolled but improving per continuous glucose monitor; Most recent A1c above goal of <7% per ADA guidelines Recent changes: Trulicity 1.16 mg subcutaneously weekly initiated, glipizide discontinued, and Lantus decreased to 50 units subcutaneously at bedtime Patient was due for next dose of Trulicity on 08/07/88 and was supposed to increase to 1.5 mg weekly, but insurance requiring prior authorization due to need for step therapy. Pharmacy and insurance called on 05/09/21. Coverage determination override was completed verbally on 05/09/21. Patient has tried and failed metformin and also has cardiovascular disease so step therapy not required for coverage of GLP-1 agonist. Patient reports he has still not received Trulicity.  Decreased appetite since starting Trulicity Current glucose readings: see Dexcom Clarity report below (last 30 days). GMI 6.4%. Average blood glucose 130 with 91% time in target range, 8% above target range, and 1% below target range. Bedtime blood glucose appears to be ~90-100 and there are some borderline overnight hypoglycemic events.   Decrease Lantus by 20% to 40 units at bedtime once Trulicity dose increased to 1.5 mg weekly due to concern for overnight hypoglycemia Increase Trulicity to 1.5 mg subcutaneously once weekly. Spoke with pharmacist at [facility] and the claim went through successfully today. They will call him when ready for pickup. Instructed to monitor blood sugars continuously with continuous glucose monitor. Patient aware to check finger stick blood glucose if sensor glucose reading dose not match symptoms. Patient reports he has 3 week supply of Dexcom remaining. Reminded him to call [facility] for refills.       Subjective: Robert [name] is an 71 y.o. year old male who is a primary patient of Moshe Cipro, Norwood Levo, MD.  The CCM team was consulted for assistance with disease management and care coordination needs.    Engaged with patient by telephone for follow up visit in response to provider referral for pharmacy case management and/or care coordination services.   Consent to Services:  The patient was given information about Chronic Care Management services, agreed to services, and gave verbal consent prior to initiation of services.  Please see initial visit note for detailed documentation.   Patient Care Team: Fayrene Helper, MD as PCP - General Branch, Alphonse Guild, MD as PCP - Cardiology (Cardiology) Beryle Lathe, [facility] (Pharmacist)  Objective:  Lab Results  Component Value Date   CREATININE 1.31 (H) 03/15/2021   CREATININE 1.21 12/13/2020   CREATININE 1.26 08/05/2020    Lab Results  Component Value Date   HGBA1C 7.7 (H) 03/15/2021   Last diabetic Eye exam:  Lab Results  Component Value Date/Time   HMDIABEYEEXA No Retinopathy 06/23/2020 12:00 AM    Last diabetic Foot exam:  Lab Results  Component Value Date/Time   HMDIABFOOTEX yes 01/13/2010 12:00 AM        Component Value Date/Time   CHOL 114 03/15/2021 1058   TRIG 84 03/15/2021 1058   HDL 36 (L) 03/15/2021 1058   CHOLHDL 3.2 03/15/2021 1058   CHOLHDL 3.2 01/16/2020 0802   VLDL 17 08/11/2016 0821   LDLCALC 61 03/15/2021 1058   LDLCALC 70 01/16/2020 0802    Hepatic Function Latest Ref Rng & Units 03/15/2021 08/05/2020 01/16/2020  Total Protein 6.0 - 8.5 g/dL 6.8 7.1 6.6  Albumin 3.8 - 4.8 g/dL 4.1 4.3 -  AST 0 - 40 IU/L 15 20 19  ALT 0 - 44 IU/L _0 Alk Phosphatase 44 - 121 IU/L 141(H) 129(H) -  Total Bilirubin 0.0 - 1.2 mg/dL 0.4 0.5 0.4  Bilirubin, Direct 0.0 - 0.3 mg/dL - - -    Lab Results  Component Value Date/Time   TSH 1.420 03/15/2021 10:58 AM   TSH 1.76 11/04/2018 07:51 AM    CBC Latest Ref Rng & Units 08/05/2020 01/16/2020 04/02/2018  WBC 3.4 - 10.8 x10E3/uL 6.2 5.1 5.4   Hemoglobin 13.0 - 17.7 g/dL 13.6 13.2 13.2  Hematocrit 37.5 - 51.0 % 40.5 39.7 41.0  Platelets 150 - 450 x10E3/uL 224 228 268    Lab Results  Component Value Date/Time   VD25OH 26 (L) 11/04/2018 07:51 AM   VD25OH 23 (L) 11/21/2017 07:23 AM    Clinical ASCVD: No  The ASCVD Risk score (Arnett DK, et al., 2019) failed to calculate for the following reasons:   The valid total cholesterol range is 130 to 320 mg/dL    Social History   Tobacco Use  Smoking Status Former   Packs/day: 1.00   Years: 30.00   Pack years: 30.00   Types: Cigarettes   Start date: 04/03/1966   Quit date: 09/02/1983   Years since quitting: 37.7  Smokeless Tobacco Never   BP Readings from Last 3 Encounters:  02/11/21 130/72  12/17/20 136/72  09/21/20 (!) 154/87   Pulse Readings from Last 3 Encounters:  02/11/21 77  12/17/20 64  09/21/20 72   Wt Readings from Last 3 Encounters:  02/11/21 226 lb (102.5 kg)  12/17/20 225 lb (102.1 kg)  09/21/20 228 lb (103.4 kg)    Assessment: Review of patient past medical history, allergies, medications, health status, including review of consultants reports, laboratory and other test data, was performed as part of comprehensive evaluation and provision of chronic care management services.   SDOH:  (Social Determinants of Health) assessments and interventions performed:    CCM Care Plan  Allergies  Allergen Reactions   Metformin And Related Other (See Comments)    Stomach upset   Rosuvastatin Other (See Comments)    Adverse GI symptoms   Codeine     "Did not work" and "made me feel foolish"    Medications Reviewed Today     Reviewed by Beryle Lathe, [facility] (Pharmacist) on 05/11/21 at 1407  Med List Status: <None>   Medication Order Taking? Sig Documenting Provider Last Dose Status Informant  ACCU-CHEK FASTCLIX LANCETS [place] [id number] Yes Use up to three times daily Dx e11.22 Fayrene Helper, MD Taking Active   acetaminophen (TYLENOL) 500 MG  tablet [id number] Yes Take 500 mg by mouth as needed. [provider] Taking Active   aspirin 81 MG tablet [id number] Yes Take 81 mg by mouth daily. [provider] Taking Active Self  atorvastatin (LIPITOR) 40 MG tablet [id number] Yes TAKE 1 TABLET BY MOUTH  DAILY Branch, Alphonse Guild, MD Taking Active   Blood Glucose Monitoring Suppl (ACCU-CHEK NANO SMARTVIEW) w/Device Drucie Opitz [id number] Yes TEST 1 TO 2 TIMES DAILY Fayrene Helper, MD Taking Active   Dulaglutide (TRULICITY) 1.5 OH/6.0VP SOPN [id number] Yes Inject 1.5 mg into the skin once a week. Fayrene Helper, MD Taking Active   glucose blood (ACCU-CHEK SMARTVIEW) test strip [id number] Yes USE AS INSTRUCTED 3 TIMES  DAILY Fayrene Helper, MD Taking Active   insulin glargine (LANTUS SOLOSTAR) 100 UNIT/ML Solostar Pen [id number] Yes INJECT SUBCUTANEOUSLY 70  UNITS DAILY  Patient taking differently: Inject 50  Units into the skin at bedtime.   Fayrene Helper, MD Taking Active            Med Note Vernell Barrier Feb 16, 2021  3:08 PM)    Insulin Pen Needle (UNIFINE PENTIPS) 31G X 8 MM MISC [id number] Yes Use as directed once daily Fayrene Helper, MD Taking Active   Insulin Syringe-Needle U-100 (INSULIN SYRINGE .5CC/30GX5/16") 30G X 5/16" 0.5 ML MISC [id number] Yes Use as directed with novolog 70/30 Fayrene Helper, MD Taking Active   Magnesium 250 MG TABS [id number] Yes Take 250 mg by mouth daily. [provider] Taking Active Self  meclizine (ANTIVERT) 25 MG tablet [id number] Yes Take 1 tablet (25 mg total) by mouth 3 (three) times daily as needed for dizziness. Fayrene Helper, MD Taking Active   metoprolol succinate (TOPROL-XL) 50 MG 24 hr tablet [id number] Yes TAKE 1 TABLET BY MOUTH  DAILY WITH OR IMMEDIATELY  AFTER A MEAL Fayrene Helper, MD Taking Active   nitroGLYCERIN (NITROSTAT) 0.4 MG SL tablet [id number] No Place 1 tablet (0.4 mg total) under the tongue every 5 (five) minutes as needed  for chest pain. Ahmed Prima, [place] M, PA-C Unknown Active   nystatin (MYCOSTATIN/NYSTOP) powder [id number] Yes Apply topically between toes [provider] Taking Active   olmesartan (BENICAR) 20 MG tablet [id number] Yes TAKE 1 TABLET BY MOUTH  DAILY Branch, Alphonse Guild, MD Taking Active   sildenafil (VIAGRA) 100 MG tablet [id number] Yes Take 1 tablet  by mouth daily as needed for erectile dysfunction. Fayrene Helper, MD Taking Active             Patient Active Problem List   Diagnosis Date Noted   Light headedness 02/12/2021   Asteatotic eczema 12/16/2020   Rash 05/04/2020   Knee pain, right 11/05/2018   Type 2 diabetes mellitus with vascular disease ([place]) 03/05/2018   Annual physical exam 08/15/2016   Hypersomnia with sleep apnea 01/16/2016   Seasonal allergies 01/04/2015   Tubular adenoma of colon 11/13/2014   ASCVD (arteriosclerotic cardiovascular disease) 08/21/2014   Bilateral hand pain 04/06/2014   Back pain 09/16/2013   Essential hypertension    Hyperlipidemia LDL goal <70    Obesity (BMI 30.0-34.9) 12/24/2011   ED (erectile dysfunction) 02/14/2011   Cardiovascular disease 09/27/2009   TOBACCO ABUSE, HX OF 09/27/2009    Immunization History  Administered Date(s) Administered   Fluad Quad(high Dose 65+) 01/01/2019, 12/17/2020   Influenza Split 02/14/2011, 12/18/2011   Influenza Whole 12/25/2006, 12/23/2008, 01/13/2010   Influenza,inj,Quad PF,6+ Mos 01/10/2013, 12/02/2013, 01/26/2015, 12/09/2015, 11/30/2016, 12/31/2017, 12/13/2019   PFIZER(Purple Top)SARS-COV-2 Vaccination 05/01/2019, 05/23/2019, 01/08/2020, 07/12/2020   Pneumococcal Conjugate-13 11/10/2013   Pneumococcal Polysaccharide-23 03/10/2004, 01/13/2010, 11/30/2016   Tdap 12/18/2011   Zoster Recombinat (Shingrix) 12/17/2019   Zoster, Live 12/18/2011    Conditions to be addressed/monitored: HTN and DMII  Care Plan : Medication Management  Updates made by Beryle Lathe, [place] since  05/11/2021 12:00 AM     Problem: T2DM & HTN   Priority: High  Onset Date: 01/11/2021     Long-Range Goal: Disease Progression Prevention   Start Date: 01/11/2021  Expected End Date: 04/11/2021  Recent Progress: On track  Priority: High  Note:   Current Barriers:  Unable to achieve control of diabetes  Pharmacist Clinical Goal(s):  Through collaboration with PharmD and provider, patient will:  Achieve control of diabetes as evidenced by improved A1c   Interventions: 1:1 collaboration with Tula Nakayama  E, MD regarding development and update of comprehensive plan of care as evidenced by provider attestation and co-signature Inter-disciplinary care team collaboration (see longitudinal plan of care) Comprehensive medication review performed; medication list updated in electronic medical record  Type 2 Diabetes - Goal on Track (progressing): YES.: Uncontrolled but improving per continuous glucose monitor; Most recent A1c above goal of <7% per ADA guidelines Current medications: dulaglutide (Trulicity) 3.71 mg subcutaneously weekly and insulin glargine (Lantus) 50 units subcutaneously at bedtime Recent changes: Trulicity 6.96 mg subcutaneously weekly initiated, glipizide discontinued, and Lantus decreased to 50 units subcutaneously at bedtime Intolerances:  metformin (GI intolerance) Taking medications as directed: no, patient was due for next dose of Trulicity on 10/09/91 and was supposed to increase to 1.5 mg weekly, but insurance requiring prior authorization due to need for step therapy. Pharmacy and insurance called on 05/09/21. Coverage determination override was completed verbally on 05/09/21. Patient has tried and failed metformin and also has cardiovascular disease so step therapy not required for coverage of GLP-1 agonist. Patient reports he has still not received Trulicity.  Side effects thought to be attributed to current medication regimen: no, but does report decreased appetite  since starting Trulicity Hypoglycemia prevention: not indicated at this time Current meal patterns: not discussed today Current exercise:  plays golf most days On a statin: yes On aspirin 81 mg daily: yes Last microalbumin/creatinine ratio: 55 (08/05/20); on an ACEi/ARB: yes Last eye exam: completed within last year Last foot exam: completed within last year Current glucose readings: see Dexcom Clarity report below (last 30 days). GMI 6.4%. Average blood glucose 130 with 91% time in target range, 8% above target range, and 1% below target range. Bedtime blood glucose appears to be ~90-100 and there are some borderline overnight hypoglycemic events.   Decrease Lantus by 20% to 40 units at bedtime once Trulicity dose increased to 1.5 mg weekly due to concern for overnight hypoglycemia Increase Trulicity to 1.5 mg subcutaneously once weekly. Spoke with pharmacist at [name] and the claim went through successfully today. They will call him when ready for pickup. Instructed to monitor blood sugars continuously with continuous glucose monitor. Patient aware to check finger stick blood glucose if sensor glucose reading dose not match symptoms. Patient reports he has 3 week supply of Dexcom remaining. Reminded him to call [facility] for refills.  Patient reports that he has plans ~5 month supply of Lantus remaining but would like to switch to [place] Flextouch when he runs out. Patient identified as a good candidate for SGLT-2 inhibitor given reduction in cardiovascular disease, slowed chronic kidney disease progression, low risk of hypoglycemia, weight loss, and blood pressure lowering. Patient denies a history of significant genitourinary infections.  No concern for hypotension/volume depletion.  GFR at least >20 mL/minute/1.73 m2. Will consider discussing at future visits. Discussed management of hypoglycemia. If blood sugar <70 at any time, treat with simple sugar such as 1/2 cup juice or regular  soda or 3-4 glucose tablets. Recheck blood sugar in 15 minutes and repeat if blood sugar remains <70.       Hypertension - Goal Met.: Blood pressure under good control. Blood pressure is at goal of <130/80 mmHg per 2017 AHA/ACC guidelines. Current medications: olmesartan 20 mg by mouth at bedtime and metoprolol succinate 50 mg by mouth at bedtime Intolerances: none Taking medications as directed: yes Side effects thought to be attributed to current medication regimen: no Current home blood pressure: patient has a blood pressure machine but does  not currently check  Encourage dietary sodium restriction/DASH diet Recommend regular aerobic exercise Recommend home blood pressure monitoring to discuss at next visit Discussed need for medication compliance Continue current medications as above   Patient Goals/Self-Care Activities Patient will:  Take medications as prescribed Check blood sugar continuously with continuous glucose monitor, document, and provide at future appointments Check blood pressure at least once daily, document, and provide at future appointments Target a minimum of 150 minutes of moderate intensity exercise weekly  Follow Up Plan: Telephone follow up appointment with care management team member scheduled for: 06/15/21      Medication Assistance: None required.  Patient affirms current coverage meets needs.  Patient's preferred pharmacy is:  [place], [place] - [place] [address] [place] [address] Phone: [phone] Fax: [phone]  [facility] ([place], [place] [address] [place] [place] [address] [place] [address] Phone: [phone] Fax: [phone]  [id number] [place] [place] [facility]Phone: [phone]: [phone] [facility] ([facility] ) - [place] [address] [address]Phone: [phone]: [address]Phone: [phone]: [phone] Follow Up:  Patient agrees to Care Plan and Follow-up.  Plan: Telephone follow up appointment with care management team member scheduled for:  06/15/21  CKennon Holter PharmD, BHadar CSan AugustineClinical Pharmacist Practitioner RSaint [facility] [phone]

## 2021-05-11 NOTE — Patient Instructions (Signed)
Robert [name],  It was great to talk to you today!  Call [facility] to refill your Dexcom: [phone] Increase Trulicity to 1.5 mg subcutaneously once weekly. Please pickup from [facility] Decrease Lantus to 40 units subcutaneously at bedtime  Please call me with any questions or concerns.  Visit Information  Following are the goals we discussed today:   Goals Addressed             This Visit's Progress    Medication Management       Patient Goals/Self-Care Activities Patient will:  Take medications as prescribed Check blood sugar continuously with continuous glucose monitor, document, and provide at future appointments Check blood pressure at least once daily, document, and provide at future appointments Target a minimum of 150 minutes of moderate intensity exercise weekly            Follow-up plan: Telephone follow up appointment with care management team member scheduled for:  06/15/21 Future Appointments  Date Time Provider [place]  05/27/2021  8:45 AM Marzetta Board, DPM [facility]  06/03/2021  3:05 PM Barrett, Evelene Croon, PA-C [facility] [place] [facility]  06/14/2021  9:00 AM Fayrene Helper, MD [facility]  06/15/2021  4:15 PM [facility] PHARMACIST [facility]  12/06/2021  2:00 PM [facility] NURSE [facility]    Patient verbalizes understanding of instructions and care plan provided today and agrees to view in [place]. Active MyChart status confirmed with patient.    Please call the care guide team at [phone] if you need to cancel or reschedule your appointment.   Kennon Holter, PharmD, Para [place], CPP Clinical Pharmacist Practitioner [facility] [phone]

## 2021-05-20 ENCOUNTER — Other Ambulatory Visit (HOSPITAL_COMMUNITY): Payer: Self-pay

## 2021-05-20 ENCOUNTER — Ambulatory Visit: Payer: HMO | Admitting: Pharmacist

## 2021-05-20 DIAGNOSIS — Z794 Long term (current) use of insulin: Secondary | ICD-10-CM

## 2021-05-20 DIAGNOSIS — I10 Essential (primary) hypertension: Secondary | ICD-10-CM

## 2021-05-20 DIAGNOSIS — E1169 Type 2 diabetes mellitus with other specified complication: Secondary | ICD-10-CM

## 2021-05-20 MED ORDER — FREESTYLE LIBRE 2 SENSOR MISC
5 refills | Status: DC
  Filled 2021-05-20: qty 2, 28d supply, fill #0
  Filled 2021-06-26: qty 2, 28d supply, fill #1
  Filled 2021-07-21: qty 2, 28d supply, fill #2
  Filled 2021-08-24: qty 2, 28d supply, fill #3
  Filled 2021-09-17: qty 2, 28d supply, fill #4
  Filled 2021-10-13: qty 2, 28d supply, fill #5

## 2021-05-20 NOTE — Chronic Care Management (AMB) (Signed)
Chronic Care Management Pharmacy Note  05/20/2021 Name:  Robert [name] MRN:  [id number] DOB:  1950-10-25  Summary: Type 2 Diabetes : Uncontrolled but improving per continuous glucose monitor; Most recent A1c above goal of <7% per ADA guidelines Recent changes: Trulicity increased to 1.5 mg subcutaneously weekly and Lantus decreased to 40 units subcutaneously at bedtime Patient reports that he has about 1-2 weeks left of Dexcom supplies but found out this his insurance no longer covers Dexcom and covers Freestyle Libre 2 at his pharmacy  Current glucose readings: not discussed today but stable per Dexcom Clarity report from prior Continue dulaglutide (Trulicity) 1.5 mg subcutaneously weekly and insulin glargine (Lantus) 40 units subcutaneously at bedtime Instructed to monitor blood sugars continuously with continuous glucose monitor. Patient aware to check finger stick blood glucose if sensor glucose reading dose not match symptoms. Will have patient switch from Dexcom G6 to [employer] 2 sensors. Patient was sent instructions for placement and setup via MyChart. Patient was also sent email link to connect with our clinic.   Subjective: Robert [name] is an 71 y.o. year old male who is a primary patient of Moshe Cipro, Norwood Levo, MD.  The CCM team was consulted for assistance with disease management and care coordination needs.    Engaged with patient by telephone for follow up visit in response to provider referral for pharmacy case management and/or care coordination services.   Consent to Services:  The patient was given information about Chronic Care Management services, agreed to services, and gave verbal consent prior to initiation of services.  Please see initial visit note for detailed documentation.   Patient Care Team: Fayrene Helper, MD as PCP - General Harl Bowie, Alphonse Guild, MD as PCP - Cardiology (Cardiology) Beryle Lathe, [facility]  (Pharmacist)  Objective:  Lab Results  Component Value Date   CREATININE 1.31 (H) 03/15/2021   CREATININE 1.21 12/13/2020   CREATININE 1.26 08/05/2020    Lab Results  Component Value Date   HGBA1C 7.7 (H) 03/15/2021   Last diabetic Eye exam:  Lab Results  Component Value Date/Time   HMDIABEYEEXA No Retinopathy 06/23/2020 12:00 AM    Last diabetic Foot exam:  Lab Results  Component Value Date/Time   HMDIABFOOTEX yes 01/13/2010 12:00 AM        Component Value Date/Time   CHOL 114 03/15/2021 1058   TRIG 84 03/15/2021 1058   HDL 36 (L) 03/15/2021 1058   CHOLHDL 3.2 03/15/2021 1058   CHOLHDL 3.2 01/16/2020 0802   VLDL 17 08/11/2016 0821   LDLCALC 61 03/15/2021 1058   LDLCALC 70 01/16/2020 0802    Hepatic Function Latest Ref Rng & Units 03/15/2021 08/05/2020 01/16/2020  Total Protein 6.0 - 8.5 g/dL 6.8 7.1 6.6  Albumin 3.8 - 4.8 g/dL 4.1 4.3 -  AST 0 - 40 IU/L _0 ALT 0 - 44 IU/L _1 Alk Phosphatase 44 - 121 IU/L 141(H) 129(H) -  Total Bilirubin 0.0 - 1.2 mg/dL 0.4 0.5 0.4  Bilirubin, Direct 0.0 - 0.3 mg/dL - - -    Lab Results  Component Value Date/Time   TSH 1.420 03/15/2021 10:58 AM   TSH 1.76 11/04/2018 07:51 AM    CBC Latest Ref Rng & Units 08/05/2020 01/16/2020 04/02/2018  WBC 3.4 - 10.8 x10E3/uL 6.2 5.1 5.4  Hemoglobin 13.0 - 17.7 g/dL 13.6 13.2 13.2  Hematocrit 37.5 - 51.0 % 40.5 39.7 41.0  Platelets 150 - 450 x10E3/uL 224 228  268    Lab Results  Component Value Date/Time   VD25OH 26 (L) 11/04/2018 07:51 AM   VD25OH 23 (L) 11/21/2017 07:23 AM    Clinical ASCVD: No  The ASCVD Risk score (Arnett DK, et al., 2019) failed to calculate for the following reasons:   The valid total cholesterol range is 130 to 320 mg/dL     Social History   Tobacco Use  Smoking Status Former   Packs/day: 1.00   Years: 30.00   Pack years: 30.00   Types: Cigarettes   Start date: 04/03/1966   Quit date: 09/02/1983   Years since quitting: 37.7  Smokeless  Tobacco Never   BP Readings from Last 3 Encounters:  02/11/21 130/72  12/17/20 136/72  09/21/20 (!) 154/87   Pulse Readings from Last 3 Encounters:  02/11/21 77  12/17/20 64  09/21/20 72   Wt Readings from Last 3 Encounters:  02/11/21 226 lb (102.5 kg)  12/17/20 225 lb (102.1 kg)  09/21/20 228 lb (103.4 kg)    Assessment: Review of patient past medical history, allergies, medications, health status, including review of consultants reports, laboratory and other test data, was performed as part of comprehensive evaluation and provision of chronic care management services.   SDOH:  (Social Determinants of Health) assessments and interventions performed:    CCM Care Plan  Allergies  Allergen Reactions   Metformin And Related Other (See Comments)    Stomach upset   Rosuvastatin Other (See Comments)    Adverse GI symptoms   Codeine     "Did not work" and "made me feel foolish"    Medications Reviewed Today     Reviewed by Beryle Lathe, [facility] (Pharmacist) on 05/20/21 at 0850  Med List Status: <None>   Medication Order Taking? Sig Documenting Provider Last Dose Status Informant  ACCU-CHEK FASTCLIX LANCETS [place] [id number] Yes Use up to three times daily Dx e11.22 Fayrene Helper, MD Taking Active   acetaminophen (TYLENOL) 500 MG tablet [id number] Yes Take 500 mg by mouth as needed. [provider] Taking Active   aspirin 81 MG tablet [id number] Yes Take 81 mg by mouth daily. [provider] Taking Active Self  atorvastatin (LIPITOR) 40 MG tablet [id number] Yes TAKE 1 TABLET BY MOUTH  DAILY Branch, Alphonse Guild, MD Taking Active   Blood Glucose Monitoring Suppl (ACCU-CHEK NANO SMARTVIEW) w/Device Drucie Opitz [id number] Yes TEST 1 TO 2 TIMES DAILY Fayrene Helper, MD Taking Active   Continuous Blood Gluc Sensor (DEXCOM G6 SENSOR) MISC [id number] Yes Use to monitor glucose. Change every 10 days. [provider] Taking Active   Continuous Blood Gluc Transmit  (DEXCOM G6 TRANSMITTER) MISC [id number] Yes Use to monitor glucose. Change every 10 days. [provider] Taking Active   Dulaglutide (TRULICITY) 1.5 OM/6.0OK SOPN [id number] Yes Inject 1.5 mg into the skin once a week. Fayrene Helper, MD Taking Active            Med Note Waldo Laine, Gwenyth Allegra   Fri May 20, 2021  8:49 AM)    glucose blood (ACCU-CHEK SMARTVIEW) test strip [id number] Yes USE AS INSTRUCTED 3 TIMES  DAILY Fayrene Helper, MD Taking Active   insulin glargine (LANTUS SOLOSTAR) 100 UNIT/ML Solostar Pen [id number] Yes Inject 40 Units into the skin at bedtime. Fayrene Helper, MD Taking Active   Insulin Pen Needle (UNIFINE PENTIPS) 31G X 8 MM MISC [id number] Yes Use as directed once daily Fayrene Helper, MD Taking Active   Magnesium 250  MG TABS [id number] Yes Take 250 mg by mouth daily. [provider] Taking Active Self  meclizine (ANTIVERT) 25 MG tablet [id number] Yes Take 1 tablet (25 mg total) by mouth 3 (three) times daily as needed for dizziness. Fayrene Helper, MD Taking Active   metoprolol succinate (TOPROL-XL) 50 MG 24 hr tablet [id number] Yes TAKE 1 TABLET BY MOUTH  DAILY WITH OR IMMEDIATELY  AFTER A MEAL Fayrene Helper, MD Taking Active   nitroGLYCERIN (NITROSTAT) 0.4 MG SL tablet [id number] Yes Place 1 tablet (0.4 mg total) under the tongue every 5 (five) minutes as needed for chest pain. Ahmed Prima [place] M, PA-C Taking Active   nystatin (MYCOSTATIN/NYSTOP) powder [id number] Yes Apply topically between toes [provider] Taking Active   olmesartan (BENICAR) 20 MG tablet [id number] Yes TAKE 1 TABLET BY MOUTH  DAILY Branch, Alphonse Guild, MD Taking Active   sildenafil (VIAGRA) 100 MG tablet [id number] Yes Take 1 tablet  by mouth daily as needed for erectile dysfunction. Fayrene Helper, MD Taking Active             Patient Active Problem List   Diagnosis Date Noted   Light headedness 02/12/2021   Asteatotic eczema 12/16/2020    Rash 05/04/2020   Knee pain, right 11/05/2018   Type 2 diabetes mellitus with vascular disease ([place]) 03/05/2018   Annual physical exam 08/15/2016   Hypersomnia with sleep apnea 01/16/2016   Seasonal allergies 01/04/2015   Tubular adenoma of colon 11/13/2014   ASCVD (arteriosclerotic cardiovascular disease) 08/21/2014   Bilateral hand pain 04/06/2014   Back pain 09/16/2013   Essential hypertension    Hyperlipidemia LDL goal <70    Obesity (BMI 30.0-34.9) 12/24/2011   ED (erectile dysfunction) 02/14/2011   Cardiovascular disease 09/27/2009   TOBACCO ABUSE, HX OF 09/27/2009    Immunization History  Administered Date(s) Administered   Fluad Quad(high Dose 65+) 01/01/2019, 12/17/2020   Influenza Split 02/14/2011, 12/18/2011   Influenza Whole 12/25/2006, 12/23/2008, 01/13/2010   Influenza,inj,Quad PF,6+ Mos 01/10/2013, 12/02/2013, 01/26/2015, 12/09/2015, 11/30/2016, 12/31/2017, 12/13/2019   PFIZER(Purple Top)SARS-COV-2 Vaccination 05/01/2019, 05/23/2019, 01/08/2020, 07/12/2020   Pneumococcal Conjugate-13 11/10/2013   Pneumococcal Polysaccharide-23 03/10/2004, 01/13/2010, 11/30/2016   Tdap 12/18/2011   Zoster Recombinat (Shingrix) 12/17/2019   Zoster, Live 12/18/2011    Conditions to be addressed/monitored: HTN and DMII  Care Plan : Medication Management  Updates made by Beryle Lathe, [place] since 05/20/2021 12:00 AM     Problem: T2DM & HTN   Priority: High  Onset Date: 01/11/2021     Long-Range Goal: Disease Progression Prevention   Start Date: 01/11/2021  Expected End Date: 04/11/2021  Recent Progress: On track  Priority: High  Note:   Current Barriers:  Unable to achieve control of diabetes  Pharmacist Clinical Goal(s):  Through collaboration with PharmD and provider, patient will:  Achieve control of diabetes as evidenced by improved A1c   Interventions: 1:1 collaboration with Fayrene Helper, MD regarding development and update of comprehensive plan  of care as evidenced by provider attestation and co-signature Inter-disciplinary care team collaboration (see longitudinal plan of care) Comprehensive medication review performed; medication list updated in electronic medical record  Type 2 Diabetes - Goal on Track (progressing): YES.: Uncontrolled but improving per continuous glucose monitor; Most recent A1c above goal of <7% per ADA guidelines Current medications: dulaglutide (Trulicity) 1.5 mg subcutaneously weekly and insulin glargine (Lantus) 40 units subcutaneously at bedtime Recent changes: Trulicity increased to 1.5 mg subcutaneously weekly and Lantus decreased to 40 units  subcutaneously at bedtime Patient reports that he has about 1-2 weeks left of Dexcom supplies but found out this his insurance no longer covers Dexcom and covers Freestyle Libre 2 at his pharmacy  Intolerances:  metformin (GI intolerance) Taking medications as directed: yes Side effects thought to be attributed to current medication regimen: no, but does report decreased appetite since starting Trulicity Hypoglycemia prevention: not indicated at this time Current meal patterns: not discussed today Current exercise:  plays golf most days On a statin: yes On aspirin 81 mg daily: yes Last microalbumin/creatinine ratio: 55 (08/05/20); on an ACEi/ARB: yes Last eye exam: completed within last year Last foot exam: completed within last year Current glucose readings: not discussed today but stable per Dexcom Clarity report from prior Continue dulaglutide (Trulicity) 1.5 mg subcutaneously weekly and insulin glargine (Lantus) 40 units subcutaneously at bedtime Instructed to monitor blood sugars continuously with continuous glucose monitor. Patient aware to check finger stick blood glucose if sensor glucose reading dose not match symptoms. Will have patient switch from Dexcom G6 to [employer] 2 sensors. Patient was sent instructions for placement and setup via MyChart.  Patient was also sent email link to connect with our clinic.  Patient reports that he has plans ~5 month supply of Lantus remaining but would like to switch to [place] Flextouch when he runs out. Patient identified as a good candidate for SGLT-2 inhibitor given reduction in cardiovascular disease, slowed chronic kidney disease progression, low risk of hypoglycemia, weight loss, and blood pressure lowering. Patient denies a history of significant genitourinary infections.  No concern for hypotension/volume depletion.  GFR at least >20 mL/minute/1.73 m2. Will consider discussing at future visits. Discussed management of hypoglycemia. If blood sugar <70 at any time, treat with simple sugar such as 1/2 cup juice or regular soda or 3-4 glucose tablets. Recheck blood sugar in 15 minutes and repeat if blood sugar remains <70.   Hypertension - Goal Met.: Blood pressure under good control. Blood pressure is at goal of <130/80 mmHg per 2017 AHA/ACC guidelines. Current medications: olmesartan 20 mg by mouth at bedtime and metoprolol succinate 50 mg by mouth at bedtime Intolerances: none Taking medications as directed: yes Side effects thought to be attributed to current medication regimen: no Current home blood pressure: patient has a blood pressure machine but does not currently check  Encourage dietary sodium restriction/DASH diet Recommend regular aerobic exercise Recommend home blood pressure monitoring to discuss at next visit Discussed need for medication compliance Continue current medications as above   Patient Goals/Self-Care Activities Patient will:  Take medications as prescribed Check blood sugar continuously with continuous glucose monitor, document, and provide at future appointments Check blood pressure at least once daily, document, and provide at future appointments Target a minimum of 150 minutes of moderate intensity exercise weekly  Follow Up Plan: Telephone follow up appointment  with care management team member scheduled for: 06/15/21      Medication Assistance: None required.  Patient affirms current coverage meets needs.  Patient's preferred pharmacy is:  [place], [place] - [place] [address] [place] [address] Phone: [phone] Fax: [phone]  [facility] ([place], [place] [facility] [address] [place] [address] Phone: [phone] Fax: [phone]  [id number] - [place], [address] AT [facility] [place] [address] Phone: [phone] Fax: [phone]  [facility] ([facility] ) - [address], [place] [address] Phone: [phone] Fax: [place] [address] Phone: [phone] Fax: [phone]  Follow Up:  Patient agrees to Care Plan and Follow-up.  Plan: Telephone follow up appointment with care management team member scheduled for:  06/15/21

## 2021-05-20 NOTE — Patient Instructions (Signed)
Robert [name],  It was great to talk to you today!  Please call me with any questions or concerns.  Visit Information  Following are the goals we discussed today:   Goals Addressed             This Visit's Progress    Medication Management       Patient Goals/Self-Care Activities Patient will:  Take medications as prescribed Check blood sugar continuously with continuous glucose monitor, document, and provide at future appointments Check blood pressure at least once daily, document, and provide at future appointments Target a minimum of 150 minutes of moderate intensity exercise weekly             Follow-up plan: Telephone follow up appointment with care management team member scheduled for:  06/15/21  Patient verbalizes understanding of instructions and care plan provided today and agrees to view in [place]. Active MyChart status confirmed with patient.    Please call the care guide team at [phone] if you need to cancel or reschedule your appointment.   Kennon Holter, PharmD, Para [place], CPP Clinical Pharmacist Practitioner [facility] Primary Care [phone]  [facility] Training  1. For help or to see the videos: https://www.freestyle.abbott/us-en/support.html  2. You should have a sensor, sensor applicator, and a display device (Freestyle [place] Reader or [place]) Each sensor is good for 14 days Smart phone compatibility can be found here: https://www.freestyle.abbott/ie/en/librelink/compatibility-guide.html            3. You will still need your glucose meter If you take more than 500 mg of vitamin C per day. This may cause falsely high blood sugar readings.  Any time your symptoms do not match the reading on your [facility] Any time you don't have a number and an arrow  4. If you have a MRI or CT scan The company recommends you not wear the [facility] as it is unknown if the heat and magnetic fields  could damage the components. To get the most out of your session, we advise that you try to schedule your procedure near the end of your sensor session to avoid needing an extra sensor.  5. Apply sensors on the back of your upper arm. Change the site where you place the sensor with each new insertion. If there is adhesive residue left on the skin, you can try applying a household oil like baby oil or coconut oil.  6. Avoid injecting insulin within 3 inches of the sensor.  7. Water. The sensor is water resistant, but the receiver is not. You can still swim, shower, and take a bath. Do NOT take your sensor deeper than 3 feet (1 meter) or immerse it longer than 30 minutes in water. Your receiver needs to be in a dry area and within 20 feet of the transmitter to get readings.    8. If you want to use your smartphone instead of the [place]. You will need to create a LibreView account.   The app and the receiver do not talk to each other. You will have to set up alerts in the app as well.   9. Please call [place] if you have any questions or issues. Customer Service is available at [phone] 7 Days a Week from 8AM to 8PM (excluding holidays).  10. The sensor glucose may not always be the same as your blood glucose. Sensor glucose is a glucose estimate that lags behind the  blood glucose.      11. Connect your [place] with our clinic so we can monitor your blood sugars even when you are not in clinic. Go to your [employer] app Click "Settings" at top left  [facility] ID # to connect: primarycare  Common Concerns  Problems with Freestyle sticking? Order Skin Tac from [facility]. Alcohol swab area you plan to administer Freestyle Libre then let dry. Once dry, apply Skin Tac in a circular motion (with a spot in the middle for sensor without skin tac) and let dry. Once dry  you can apply Freestyle [place]!   Problems taking off Freestyle Libre? Remember to try to shower/bathe before removing Freestyle Libre Order Tac Away to help remove any extra adhesive left on your skin once you remove Freestyle Libre  Problems with irritation? Before applying Freestyle Libre: Apply Nasacort nasal spray (can buy over the counter) to area you will apply Freestyle Libre --> alcohol swab --> Skin Tac --> Freestyle Libre After applying Freestyle Libre: Apply hydrocortisone cream (can buy over the counter) to area until redness resolves

## 2021-05-21 ENCOUNTER — Other Ambulatory Visit: Payer: Self-pay | Admitting: Family Medicine

## 2021-05-27 ENCOUNTER — Encounter: Payer: Self-pay | Admitting: Podiatry

## 2021-05-27 ENCOUNTER — Other Ambulatory Visit: Payer: Self-pay

## 2021-05-27 ENCOUNTER — Ambulatory Visit: Payer: HMO | Admitting: Podiatry

## 2021-05-27 DIAGNOSIS — E1151 Type 2 diabetes mellitus with diabetic peripheral angiopathy without gangrene: Secondary | ICD-10-CM

## 2021-05-27 DIAGNOSIS — Q828 Other specified congenital malformations of skin: Secondary | ICD-10-CM | POA: Diagnosis not present

## 2021-05-27 DIAGNOSIS — B351 Tinea unguium: Secondary | ICD-10-CM

## 2021-05-27 DIAGNOSIS — L84 Corns and callosities: Secondary | ICD-10-CM

## 2021-05-27 DIAGNOSIS — M79675 Pain in left toe(s): Secondary | ICD-10-CM | POA: Diagnosis not present

## 2021-05-27 DIAGNOSIS — M79674 Pain in right toe(s): Secondary | ICD-10-CM | POA: Diagnosis not present

## 2021-05-31 DIAGNOSIS — E1169 Type 2 diabetes mellitus with other specified complication: Secondary | ICD-10-CM

## 2021-05-31 DIAGNOSIS — I10 Essential (primary) hypertension: Secondary | ICD-10-CM | POA: Diagnosis not present

## 2021-05-31 DIAGNOSIS — Z794 Long term (current) use of insulin: Secondary | ICD-10-CM

## 2021-05-31 NOTE — Progress Notes (Signed)
Subjective: Robert [name] is a 71 y.o. male patient seen today for follow up of  at risk foot care. Pt has h/o NIDDM with PAD and painful porokeratotic lesion(s) bilaterally and painful mycotic toenails that limit ambulation. Painful toenails interfere with ambulation. Aggravating factors include wearing enclosed shoe gear. Pain is relieved with periodic professional debridement. Painful porokeratotic lesions are aggravated when weightbearing with and without shoegear. Pain is relieved with periodic professional debridement..   Patient states blood glucose was 107 mg/dl today.   New problem(s)/concern(s) today: None    PCP is Fayrene Helper, MD. Last visit was: 02/11/2021.  Allergies  Allergen Reactions   Metformin And Related Other (See Comments)    Stomach upset   Rosuvastatin Other (See Comments)    Adverse GI symptoms   Codeine     "Did not work" and "made me feel foolish"   Objective: Physical Exam  General: Patient is a pleasant 71 y.o. African American male WD, WN in NAD. AAO x 3.   Neurovascular Examination: CFT immediate b/l LE. Faintly palpable DP/PT pulses b/l LE. Digital hair present b/l. Skin temperature gradient WNL b/l. No pain with calf compression b/l. No edema noted b/l. Mild clubbing of digits 2-5 b/l.  Protective sensation intact 5/5 intact bilaterally with 10g monofilament b/l. Vibratory sensation intact b/l.  Dermatological:  Pedal integument with normal turgor, texture and tone BLE. No open wounds b/l LE. No interdigital macerations noted b/l LE. Toenails 1-5 b/l elongated, discolored, dystrophic, thickened, crumbly with subungual debris and tenderness to dorsal palpation. Hyperkeratotic lesion(s) submet head 5 left foot.  No erythema, no edema, no drainage, no fluctuance. Porokeratotic lesion(s) mlultiple, on plantar aspect of both feet spread out geometrically. No erythema, no edema, no drainage, no fluctuance.  Musculoskeletal:  Normal muscle strength  5/5 to all lower extremity muscle groups bilaterally. Pes planus deformity noted bilateral LE.Marland Kitchen No pain, crepitus or joint limitation noted with ROM b/l LE.  Patient ambulates independently without assistive aids.  Assessment: 1. Pain due to onychomycosis of toenails of both feet   2. Porokeratosis   3. Callus   4. Diabetes mellitus type 2 with peripheral artery disease ([place])    Plan: Patient was evaluated and treated and all questions answered. Consent given for treatment as described below: -Mycotic toenails 1-5 bilaterally were debrided in length and girth with sterile nail nippers and dremel without incident. -Callus(es) submet head 5 left foot pared utilizing sterile scalpel blade without complication or incident. Total number debrided =1. -Painful porokeratotic lesion(s) both feet pared and enucleated with sterile scalpel blade without incident. Total number of lesions debrided=3. -Patient/POA to call should there be question/concern in the interim.  Return in about 3 months (around 08/24/2021).  Marzetta Board, DPM

## 2021-06-01 ENCOUNTER — Other Ambulatory Visit: Payer: Self-pay

## 2021-06-01 ENCOUNTER — Ambulatory Visit: Payer: HMO

## 2021-06-01 DIAGNOSIS — L84 Corns and callosities: Secondary | ICD-10-CM

## 2021-06-01 DIAGNOSIS — E1151 Type 2 diabetes mellitus with diabetic peripheral angiopathy without gangrene: Secondary | ICD-10-CM

## 2021-06-01 NOTE — Progress Notes (Signed)
SITUATION ?Reason for Consult: Evaluation for Prefabricated Diabetic Shoes and Custom Diabetic Inserts. ?Patient / Caregiver Report: Patient would like well fitting shoes ? ?OBJECTIVE DATA: ?Patient History / Diagnosis:  ?  ICD-10-CM   ?1. Diabetes mellitus type 2 with peripheral artery disease (HCC)  E11.51   ?  ?2. Callus  L84   ?  ? ? ?Current or Previous Devices:   Historical user ? ?In-Person Foot Examination: ?Ulcers & Callousing:   Bilateral 5th met heads ? ?Deformities:   ?- None ?  ?Shoe Size: 11.5W ? ?ORTHOTIC RECOMMENDATION ?Recommended Devices: ?- 1x pair prefabricated PDAC approved diabetic shoes; Patient Selected - Apex X801M Size 11.5W ?- 3x pair custom-to-patient PDAC approved vacuum formed diabetic insoles. ? ?GOALS OF SHOES AND INSOLES ?- Reduce shear and pressure ?- Reduce / Prevent callus formation ?- Reduce / Prevent ulceration ?- Protect the fragile healing compromised diabetic foot. ? ?Patient would benefit from diabetic shoes and inserts as patient has diabetes mellitus and the patient has one or more of the following conditions: ?- History of pre-ulcerative callus ?- Peripheral neuropathy with evidence of callus formation ? ?ACTIONS PERFORMED ?Patient was casted for insoles via crush box and measured for shoes via brannock device. Procedure was explained and patient tolerated procedure well. All questions were answered and concerns addressed. ? ?PLAN ?Patient is to be contacted and scheduled for fitting once CMN is obtained from treaing physician and shoes and insoles have been fabricated and received. ? ?

## 2021-06-02 ENCOUNTER — Telehealth: Payer: Self-pay

## 2021-06-02 NOTE — Telephone Encounter (Signed)
Casts sent to central fabrication - HOLD FOR CMN °

## 2021-06-03 ENCOUNTER — Ambulatory Visit: Payer: HMO | Admitting: Physician Assistant

## 2021-06-03 ENCOUNTER — Encounter: Payer: Self-pay | Admitting: Physician Assistant

## 2021-06-03 VITALS — BP 118/68 | HR 82 | Ht 69.0 in | Wt 215.2 lb

## 2021-06-03 DIAGNOSIS — I2510 Atherosclerotic heart disease of native coronary artery without angina pectoris: Secondary | ICD-10-CM

## 2021-06-03 DIAGNOSIS — I10 Essential (primary) hypertension: Secondary | ICD-10-CM

## 2021-06-03 DIAGNOSIS — E782 Mixed hyperlipidemia: Secondary | ICD-10-CM

## 2021-06-03 NOTE — Patient Instructions (Addendum)

## 2021-06-03 NOTE — Progress Notes (Signed)
Cardiology Office Note   Date:  06/03/2021   ID:  Robert [name], Robert [name] Sep 08, 1950, MRN [id number]  PCP:  Fayrene Helper, MD Cardiologist:  Carlyle Dolly, MD 09/21/2020 Electrphysiologist: None Rosaria Ferries, PA-C   No chief complaint on file.   History of Present Illness: Robert [name] is a 71 y.o. male with a history of abnl stress test >> cath w/ DES LAD/D1 & CTO RCA 2003 ([place]), low risk MV 2016, DM, HTN, HLD  Robert [name] presents for cardiology follow up  He has started exercising, tries to walk 3 x week. When the weather gets better, he plays golf 3-4 x week.   He never gets chest pain w/ walking, sometimes gets SOB until he gets his second wind, then is okay  No LE edema, no orthopnea or PND.   He describes nocturia 3-4 x night.  He has not talked to his PCP about this.  Having a colonoscopy this year, it has been 10 years.   He has the Dexcom, but insurance will not cover it. He changed companies. He is also on Trulicity, new from last year.  The combination of the Dexcom and the Trulicity is helping his blood sugars remain very well controlled.  He was pleased to learn that he has lost 10 lbs.    Past Medical History:  Diagnosis Date   Arteriosclerotic cardiovascular disease (ASCVD)    DES to LAD D1 - 2003 in [place]; failed intervention for a totally obstructed RCA at that time; EF of 45%; 12/2009: Equivocal stress nuclear with good exercise tolerance, negative stress EKG, normal EF with mild mid and distal inferior ischemia   Diabetes mellitus, insulin dependent (IDDM), controlled 10/25/2012   HBA1C is 6.9 on 10/22/2012, [employer] 04/26/2020   Diabetes mellitus, type II ([place])    Hyperlipidemia    Lipid profile in 09/2011:128, 130, 33, 69   Hypertension    Lab  09/2011: Normal CMet ex G-133   Tobacco abuse, in remission    30 pack years; quit in 1985    Past Surgical History:  Procedure Laterality Date   CARDIAC CATHETERIZATION     COLONOSCOPY   2010   Negative screening study   COLONOSCOPY N/A 11/11/2014   Procedure: COLONOSCOPY;  Surgeon: Rogene Houston, MD;  Location: [facility];  Service: Endoscopy;  Laterality: N/A;  [facility] ARTHROSCOPY W/ MENISCECTOMY  11/2008   Right   PRESSURE ULCER DEBRIDEMENT  2006   Right lower extremity   ROTATOR CUFF REPAIR  07/2009   Right    Current Outpatient Medications  Medication Sig Dispense Refill   ACCU-CHEK FASTCLIX LANCETS MISC Use up to three times daily Dx e11.22 300 each 1   acetaminophen (TYLENOL) 500 MG tablet Take 500 mg by mouth as needed.     aspirin 81 MG tablet Take 81 mg by mouth daily.     atorvastatin (LIPITOR) 40 MG tablet TAKE 1 TABLET BY MOUTH  DAILY 90 tablet 3   Blood Glucose Monitoring Suppl (ACCU-CHEK NANO SMARTVIEW) w/Device KIT TEST 1 TO 2 TIMES DAILY 1 kit 0   Continuous Blood Gluc Sensor (FREESTYLE LIBRE 2 SENSOR) MISC Use to monitor blood glucose continuously. Change sensor every 14 days. 2 each 5   Dulaglutide (TRULICITY) 1.5 NO/0.3BC SOPN Inject 1.5 mg into the skin once a week. 6 mL 1   glucose blood (ACCU-CHEK SMARTVIEW) test strip USE AS INSTRUCTED 3 TIMES  DAILY 300 strip 3   insulin glargine (LANTUS SOLOSTAR) 100 UNIT/ML Solostar Pen Inject 40 Units into the skin at bedtime. 75 mL 3   Insulin Pen Needle (UNIFINE PENTIPS) 31G X 8 MM MISC Use as directed once daily 100 each 0   Magnesium 250 MG TABS Take 250 mg by mouth daily.     meclizine (ANTIVERT) 25 MG tablet Take 1 tablet (25 mg total) by mouth 3 (three) times daily as needed for dizziness. 30 tablet 0   metoprolol succinate (TOPROL-XL) 50 MG 24 hr tablet TAKE 1 TABLET BY MOUTH  DAILY WITH OR IMMEDIATELY  AFTER A MEAL 90 tablet 3   nitroGLYCERIN (NITROSTAT) 0.4 MG SL tablet Place 1 tablet (0.4 mg total) under the tongue every 5 (five) minutes as needed for chest pain. 25 tablet 3   nystatin (MYCOSTATIN/NYSTOP) powder Apply topically between toes     olmesartan (BENICAR)  20 MG tablet TAKE 1 TABLET BY MOUTH  DAILY 90 tablet 3   sildenafil (VIAGRA) 100 MG tablet Take 1 tablet  by mouth daily as needed for erectile dysfunction. 10 tablet 3   triamcinolone ointment (KENALOG) 0.1 % Apply topically.     No current facility-administered medications for this visit.    Allergies:   Metformin and related, Rosuvastatin, and Codeine    Social History:  The patient  reports that he quit smoking about 37 years ago. His smoking use included cigarettes. He started smoking about 55 years ago. He has a 30.00 pack-year smoking history. He has never used smokeless tobacco. He reports that he does not drink alcohol and does not use drugs.   Family History:  The patient's family history includes Arthritis in his brother and mother; Cancer in his father; Diabetes in his brother and sister.  He indicated that his mother is alive. He indicated that his father is deceased. He indicated that his sister is alive. He indicated that both of his brothers are alive. He indicated that the status of his paternal grandfather is unknown and reported the following: good health. He indicated that his child is alive. He indicated that his other is alive.   ROS:  Please see the history of present illness. All other systems are reviewed and negative.    PHYSICAL EXAM: VS:  BP 118/68    Pulse 82    Ht _0  (1.753 m)    Wt 215 lb 3.2 oz (97.6 kg)    SpO2 96%    BMI 31.78 kg/m  , BMI Body mass index is 31.78 kg/m. GEN: Well nourished, well developed, male in no acute distress HEENT: normal for age  Neck: no JVD, no carotid bruit, no masses Cardiac: RRR; no murmur, no rubs, or gallops Respiratory:  clear to auscultation bilaterally, normal work of breathing GI: soft, nontender, nondistended, + BS MS: no deformity or atrophy; no edema; distal pulses are 2+ in all 4 extremities  Skin: warm and dry, no rash Neuro:  Strength and sensation are intact Psych: euthymic mood, full affect   EKG:  EKG  is ordered today. The ekg ordered today demonstrates SR, HR 82, no acute ischemic changes, normal intervals.   ECHO: n/a  CATH: None here  MYOVIEW: 2016  Low risk [facility] treadmill score of 7. Blood pressure demonstrated a hypertensive response to exercise. There was no ST segment deviation noted during stress. There is a small defect of mild severity present in the basal inferolateral, mid inferolateral and apical inferior location. The defect is  reversible. Consistent with mild, predominantly mid to basal inferolateral ischemia. Myocardial perfusion is abnormal. This is a low risk study.   Recent Labs: 08/05/2020: Hemoglobin 13.6; Platelets 224 03/15/2021: ALT 19; BUN 16; Creatinine, Ser 1.31; Potassium 4.7; Sodium 138; TSH 1.420  CBC    Component Value Date/Time   WBC 6.2 08/05/2020 0842   WBC 5.1 01/16/2020 0802   RBC 4.66 08/05/2020 0842   RBC 4.48 01/16/2020 0802   HGB 13.6 08/05/2020 0842   HCT 40.5 08/05/2020 0842   PLT 224 08/05/2020 0842   MCV 87 08/05/2020 0842   MCH 29.2 08/05/2020 0842   MCH 29.5 01/16/2020 0802   MCHC 33.6 08/05/2020 0842   MCHC 33.2 01/16/2020 0802   RDW 13.4 08/05/2020 0842   LYMPHSABS 1.5 08/05/2020 0842   MONOABS 0.5 11/06/2013 0901   EOSABS 0.3 08/05/2020 0842   BASOSABS 0.0 08/05/2020 0842   CMP Latest Ref Rng & Units 03/15/2021 12/13/2020 08/05/2020  Glucose 70 - 99 mg/dL 104(H) 88 107(H)  BUN 8 - 27 mg/dL _0 Creatinine 0.76 - 1.27 mg/dL 1.31(H) 1.21 1.26  Sodium 134 - 144 mmol/L 138 141 140  Potassium 3.5 - 5.2 mmol/L 4.7 4.9 4.5  Chloride 96 - 106 mmol/L 101 103 103  CO2 20 - 29 mmol/L _1 Calcium 8.6 - 10.2 mg/dL 9.4 9.0 9.3  Total Protein 6.0 - 8.5 g/dL 6.8 - 7.1  Total Bilirubin 0.0 - 1.2 mg/dL 0.4 - 0.5  Alkaline Phos 44 - 121 IU/L 141(H) - 129(H)  AST 0 - 40 IU/L 15 - 20  ALT 0 - 44 IU/L 19 - 30     Lipid Panel Lab Results  Component Value Date   CHOL 114 03/15/2021   HDL 36 (L) 03/15/2021   LDLCALC 61  03/15/2021   TRIG 84 03/15/2021   CHOLHDL 3.2 03/15/2021      Wt Readings from Last 3 Encounters:  06/03/21 215 lb 3.2 oz (97.6 kg)  02/11/21 226 lb (102.5 kg)  12/17/20 225 lb (102.1 kg)     Other studies Reviewed: Additional studies/ records that were reviewed today include: Office notes, hospital records and testing.  ASSESSMENT AND PLAN:  1.  CAD: - It has been 20 years since his stent, and he remains free of ischemic symptoms - Advised him that I did not see a need to do any cardiac testing at this time - Let us know if his condition changes -Continue cardiac meds of ASA 81 mg daily, Toprol-XL 50 mg daily, Lipitor 40 mg daily, olmesartan 20 mg daily, sublingual nitroglycerin as needed  2.  Hyperlipidemia: - Because of his diabetes, he is much more diet compliant than he used to be and is compliant with his Lipitor 40 mg daily. - Recent lipid profile as above with an LDL of 61 and an HDL of 36 - Continue current therapy and increase activity as tolerated  3.  Hypertension: -His blood pressure is well controlled on the Toprol-XL and Benicar - No med changes, continue current therapy.  Current medicines are reviewed at length with the patient today.  The patient does not have concerns regarding medicines.  The following changes have been made:  no change  Labs/ tests ordered today include:   Orders Placed This Encounter  Procedures   EKG 12-Lead     Disposition:   FU with Carlyle Dolly, MD  Signed, Rosaria Ferries, PA-C  06/03/2021 3:37 PM    [facility] Phone: [phone]; Fax: [phone]

## 2021-06-08 ENCOUNTER — Other Ambulatory Visit (HOSPITAL_COMMUNITY): Payer: Self-pay

## 2021-06-14 ENCOUNTER — Encounter: Payer: Self-pay | Admitting: Family Medicine

## 2021-06-14 ENCOUNTER — Ambulatory Visit (INDEPENDENT_AMBULATORY_CARE_PROVIDER_SITE_OTHER): Payer: HMO | Admitting: Family Medicine

## 2021-06-14 ENCOUNTER — Other Ambulatory Visit: Payer: Self-pay

## 2021-06-14 VITALS — BP 122/67 | HR 67 | Ht 69.0 in | Wt 210.0 lb

## 2021-06-14 DIAGNOSIS — E1159 Type 2 diabetes mellitus with other circulatory complications: Secondary | ICD-10-CM | POA: Diagnosis not present

## 2021-06-14 DIAGNOSIS — E669 Obesity, unspecified: Secondary | ICD-10-CM

## 2021-06-14 DIAGNOSIS — R252 Cramp and spasm: Secondary | ICD-10-CM | POA: Diagnosis not present

## 2021-06-14 DIAGNOSIS — E785 Hyperlipidemia, unspecified: Secondary | ICD-10-CM | POA: Diagnosis not present

## 2021-06-14 DIAGNOSIS — I10 Essential (primary) hypertension: Secondary | ICD-10-CM | POA: Diagnosis not present

## 2021-06-14 NOTE — Patient Instructions (Addendum)
F/U end August, call if you neeed me before ? ?Labs today chem7 and EGFr and hBa1C and magnesium ? ?Reduce lantus to 35 units daily if able , continue healthy food choice and  regular exercise ? ?Nurse pls get shingrix vaccine info fron [facility] his 2nd dose and document ? ?Pt to bring covid booster record ? ?Thanks for choosing [facility], we consider it a privelige to serve you. ? ?

## 2021-06-14 NOTE — Assessment & Plan Note (Signed)
Improved. Pt applauded on succesful weight loss through lifestyle change, and encouraged to continue same. Weight loss goal set for the next several months.  

## 2021-06-14 NOTE — Progress Notes (Signed)
? ?Robert [name]     MRN: [id number]      DOB: 1950-11-07 ? ? ?HPI ?Robert [name] is here for follow up and re-evaluation of chronic medical conditions, medication management and review of any available recent lab and radiology data.  ?Preventive health is updated, specifically  Cancer screening and Immunization.   ?Questions or concerns regarding consultations or procedures which the PT has had in the interim are  addressed. ?The PT denies any adverse reactions to current medications since the last visit.  ?Denies polyuria, polydipsia, blurred vision , or hypoglycemic episodes. ?C/o locaized right knee pain, medial aspect, no buckling, wants to hold on Ortho at this tme  ? ?ROS ?Denies recent fever or chills. ?Denies sinus pressure, nasal congestion, ear pain or sore throat. ?Denies chest congestion, productive cough or wheezing. ?Denies chest pains, palpitations and leg swelling ?Denies abdominal pain, nausea, vomiting,diarrhea or constipation.   ?Denies dysuria, frequency, hesitancy or incontinence. ?. ?Denies headaches, seizures, numbness, or tingling. ?Denies depression, anxiety or insomnia. ?Denies skin break down or rash. ? ? ?PE ? ?BP 122/67   Pulse 67   Ht '5\' 9"'$  (1.753 m)   Wt 210 lb (95.3 kg)   SpO2 94%   BMI 31.01 kg/m?  ? ?Patient alert and oriented and in no cardiopulmonary distress. ? ?HEENT: No facial asymmetry, EOMI,     Neck supple . ? ?Chest: Clear to auscultation bilaterally. ? ?CVS: S1, S2 no murmurs, no S3.Regular rate. ? ?ABD: Soft non tender.  ? ?Ext: No edema ? ?MS: Adequate ROM spine, shoulders, hips and reduced in  knees. ? ?Skin: Intact, no ulcerations or rash noted. ? ?Psych: Good eye contact, normal affect. Memory intact not anxious or depressed appearing. ? ?CNS: CN 2-12 intact, power,  normal throughout.no focal deficits noted. ? ? ?Assessment & Plan ? ?Essential hypertension ?Controlled, no change in medication ?DASH diet and commitment to daily physical activity for a minimum of  30 minutes discussed and encouraged, as a part of hypertension management. ?The importance of attaining a healthy weight is also discussed. ? ?BP/Weight 06/14/2021 06/03/2021 02/11/2021 12/17/2020 09/21/2020 08/11/2020 06/02/2020  ?Systolic BP 409 811 914 782 154 138 131  ?Diastolic BP 67 68 72 72 87 78 69  ?Wt. (Lbs) 210 215.2 226 225 228 226 224  ?BMI 31.01 31.78 33.62 33.23 33.67 33.37 33.08  ? ? ? ? ? ?Hyperlipidemia LDL goal <70 ?Hyperlipidemia:Low fat diet discussed and encouraged. ? ? ?Lipid Panel  ?Lab Results  ?Component Value Date  ? CHOL 114 03/15/2021  ? HDL 36 (L) 03/15/2021  ? [place] 61 03/15/2021  ? TRIG 84 03/15/2021  ? CHOLHDL 3.2 03/15/2021  ? ? ? ?Updated lab needed at/ before next visit. ?Needs to inc exercise and already has ? ?Obesity (BMI 30.0-34.9) ?Improved. ?Pt applauded on succesful weight loss through lifestyle change, and encouraged to continue same. ?Weight loss goal set for the next several months. ? ? ?Type 2 diabetes mellitus with vascular disease ([place]) ?Updated lab needed at/ before next visit. ?Robert [name] is reminded of the importance of commitment to daily physical activity for 30 minutes or more, as able and the need to limit carbohydrate intake to 30 to 60 grams per meal to help with blood sugar control.  ? ?The need to take medication as prescribed, test blood sugar as directed, and to call between visits if there is a concern that blood sugar is uncontrolled is also discussed.  ? ?Robert [name] is reminded of the  importance of daily foot exam, annual eye examination, and good blood sugar, blood pressure and cholesterol control. ? ?Diabetic Labs Latest Ref Rng & Units 03/15/2021 12/13/2020 08/05/2020 04/22/2020 01/16/2020  ?HbA1c 4.8 - 5.6 % 7.7(H) 7.9(H) 7.8(H) 8.4(H) 7.9(H)  ?Microalbumin Not Estab. ug/mL - - - - -  ?Micro/Creat Ratio 0 - 29 mg/g creat - - 55(H) - -  ?Chol 100 - 199 mg/dL 114 - 120 - 131  ?HDL >39 mg/dL 36(L) - 46 - 41  ?Calc LDL 0 - 99 mg/dL 61 - 55 - 70  ?Triglycerides 0  - 149 mg/dL 84 - 99 - 123  ?Creatinine 0.76 - 1.27 mg/dL 1.31(H) 1.21 1.26 1.32(H) 1.29(H)  ? ?BP/Weight 06/14/2021 06/03/2021 02/11/2021 12/17/2020 09/21/2020 08/11/2020 06/02/2020  ?Systolic BP 370 488 891 694 154 138 131  ?Diastolic BP 67 68 72 72 87 78 69  ?Wt. (Lbs) 210 215.2 226 225 228 226 224  ?BMI 31.01 31.78 33.62 33.23 33.67 33.37 33.08  ? ?Foot/eye exam completion dates Latest Ref Rng & Units 12/17/2020 06/23/2020  ?Eye Exam No Retinopathy - No Retinopathy  ?Foot exam Order - - -  ?Foot Form Completion - Done -  ? ? ? ? ?'Im[proved based on readings ? ?

## 2021-06-14 NOTE — Assessment & Plan Note (Signed)
Hyperlipidemia:Low fat diet discussed and encouraged. ? ? ?Lipid Panel  ?Lab Results  ?Component Value Date  ? CHOL 114 03/15/2021  ? HDL 36 (L) 03/15/2021  ? [place] 61 03/15/2021  ? TRIG 84 03/15/2021  ? CHOLHDL 3.2 03/15/2021  ? ? ? ?Updated lab needed at/ before next visit. ?Needs to inc exercise and already has ?

## 2021-06-14 NOTE — Assessment & Plan Note (Signed)
Updated lab needed at/ before next visit. ?Robert [name] is reminded of the importance of commitment to daily physical activity for 30 minutes or more, as able and the need to limit carbohydrate intake to 30 to 60 grams per meal to help with blood sugar control.  ? ?The need to take medication as prescribed, test blood sugar as directed, and to call between visits if there is a concern that blood sugar is uncontrolled is also discussed.  ? ?Robert [name] is reminded of the importance of daily foot exam, annual eye examination, and good blood sugar, blood pressure and cholesterol control. ? ?Diabetic Labs Latest Ref Rng & Units 03/15/2021 12/13/2020 08/05/2020 04/22/2020 01/16/2020  ?HbA1c 4.8 - 5.6 % 7.7(H) 7.9(H) 7.8(H) 8.4(H) 7.9(H)  ?Microalbumin Not Estab. ug/mL - - - - -  ?Micro/Creat Ratio 0 - 29 mg/g creat - - 55(H) - -  ?Chol 100 - 199 mg/dL 114 - 120 - 131  ?HDL >39 mg/dL 36(L) - 46 - 41  ?Calc LDL 0 - 99 mg/dL 61 - 55 - 70  ?Triglycerides 0 - 149 mg/dL 84 - 99 - 123  ?Creatinine 0.76 - 1.27 mg/dL 1.31(H) 1.21 1.26 1.32(H) 1.29(H)  ? ?BP/Weight 06/14/2021 06/03/2021 02/11/2021 12/17/2020 09/21/2020 08/11/2020 06/02/2020  ?Systolic BP 403 474 259 563 154 138 131  ?Diastolic BP 67 68 72 72 87 78 69  ?Wt. (Lbs) 210 215.2 226 225 228 226 224  ?BMI 31.01 31.78 33.62 33.23 33.67 33.37 33.08  ? ?Foot/eye exam completion dates Latest Ref Rng & Units 12/17/2020 06/23/2020  ?Eye Exam No Retinopathy - No Retinopathy  ?Foot exam Order - - -  ?Foot Form Completion - Done -  ? ? ? ? ?'Im[proved based on readings ?

## 2021-06-14 NOTE — Assessment & Plan Note (Signed)
Controlled, no change in medication ?DASH diet and commitment to daily physical activity for a minimum of 30 minutes discussed and encouraged, as a part of hypertension management. ?The importance of attaining a healthy weight is also discussed. ? ?BP/Weight 06/14/2021 06/03/2021 02/11/2021 12/17/2020 09/21/2020 08/11/2020 06/02/2020  ?Systolic BP 458 099 833 825 154 138 131  ?Diastolic BP 67 68 72 72 87 78 69  ?Wt. (Lbs) 210 215.2 226 225 228 226 224  ?BMI 31.01 31.78 33.62 33.23 33.67 33.37 33.08  ? ? ? ? ?

## 2021-06-15 ENCOUNTER — Ambulatory Visit (INDEPENDENT_AMBULATORY_CARE_PROVIDER_SITE_OTHER): Payer: HMO | Admitting: Pharmacist

## 2021-06-15 DIAGNOSIS — E1169 Type 2 diabetes mellitus with other specified complication: Secondary | ICD-10-CM

## 2021-06-15 DIAGNOSIS — I10 Essential (primary) hypertension: Secondary | ICD-10-CM

## 2021-06-15 LAB — MAGNESIUM: Magnesium: 2.1 mg/dL (ref 1.6–2.3)

## 2021-06-15 LAB — BMP8+EGFR
BUN/Creatinine Ratio: 10 (ref 10–24)
BUN: 13 mg/dL (ref 8–27)
CO2: 23 mmol/L (ref 20–29)
Calcium: 9.6 mg/dL (ref 8.6–10.2)
Chloride: 104 mmol/L (ref 96–106)
Creatinine, Ser: 1.28 mg/dL — ABNORMAL HIGH (ref 0.76–1.27)
Glucose: 112 mg/dL — ABNORMAL HIGH (ref 70–99)
Potassium: 5 mmol/L (ref 3.5–5.2)
Sodium: 139 mmol/L (ref 134–144)
eGFR: 61 mL/min/{1.73_m2} (ref >59)

## 2021-06-15 LAB — HEMOGLOBIN A1C
Est. average glucose Bld gHb Est-mCnc: 151 mg/dL
Hgb A1c MFr Bld: 6.9 % — ABNORMAL HIGH (ref 4.8–5.6)

## 2021-06-15 MED ORDER — LANTUS SOLOSTAR 100 UNIT/ML ~~LOC~~ SOPN: 35.0000 [IU] | PEN_INJECTOR | Freq: Every day | SUBCUTANEOUS | Status: DC

## 2021-06-15 NOTE — Patient Instructions (Signed)
Robert [name], ? ?It was great to talk to you today! ? ?Please call me with any questions or concerns. ? ?Visit Information ? ?Following are the goals we discussed today:  ? Goals Addressed   ? ?  ?  ?  ?  ? This Visit's Progress  ?  Medication Management     ?  Patient Goals/Self-Care Activities ?Patient will:  ?Take medications as prescribed ?Check blood sugar continuously with continuous glucose monitor, document, and provide at future appointments ?Check blood pressure at least once daily, document, and provide at future appointments ?Target a minimum of 150 minutes of moderate intensity exercise weekly ? ? ? ? ? ?  ? ?  ?  ? ?Follow-up plan: Next PCP appointment scheduled for:  11/30/21 ? ?Patient verbalizes understanding of instructions and care plan provided today and agrees to view in [place]. Active MyChart status confirmed with patient.   ? ?Please call the care guide team at [phone] if you need to cancel or reschedule your appointment.  ? ?Kennon Holter, PharmD, BCACP, CPP ?Clinical Pharmacist Practitioner ?[place] ?[phone]  ?

## 2021-06-15 NOTE — Chronic Care Management (AMB) (Signed)
? ? ?Chronic Care Management ?Pharmacy Note ? ?06/15/2021 ?Name:  Robert [name] MRN:  [id number] DOB:  June 10, 1950 ? ?Summary: ?Type 2 Diabetes - Goal Met.: ?Controlled; Most recent A1c improved to 6.9% which is at goal of <7% per ADA guidelines ?Recent changes: primary care provider decreased Lantus to 35 units subcutaneously at bedtime due to occasional hypoglycemia ?Emergency hypoglycemia treatment: may be reasonable to consider Gvoke or Baqsimi ?Current glucose readings: see Dexcom Clarity (05/08/21-06/06/21) and LibreView (06/06/21-06/15/21) reports below. Patient recently switched from [place] to [employer] due to insurance preference.  ?Continue dulaglutide (Trulicity) 1.5 mg subcutaneously weekly and insulin glargine (Lantus) 35 units subcutaneously at bedtime ?Instructed to monitor blood sugars continuously with continuous glucose monitor. Patient aware to check finger stick blood glucose if sensor glucose reading dose not match symptoms. ?Patient reports that he has plans ~4 month supply of Lantus remaining but would like to switch to [place] Flextouch when he runs out. ? ? ? ? ? ?Subjective: ?SHERLEY [name] is an 71 y.o. year old male who is a primary patient of Moshe Cipro, Norwood Levo, MD.  The CCM team was consulted for assistance with disease management and care coordination needs.   ? ?Engaged with patient by telephone for follow up visit in response to provider referral for pharmacy case management and/or care coordination services.  ? ?Consent to Services:  ?The patient was given information about Chronic Care Management services, agreed to services, and gave verbal consent prior to initiation of services.  Please see initial visit note for detailed documentation.  ? ?Patient Care Team: ?Fayrene Helper, MD as PCP - General ?Harl Bowie Alphonse Guild, MD as PCP - Cardiology (Cardiology) ?Beryle Lathe, [facility] (Pharmacist) ? ?Objective: ? ?Lab Results  ?Component Value Date  ? CREATININE 1.28 (H)  06/14/2021  ? CREATININE 1.31 (H) 03/15/2021  ? CREATININE 1.21 12/13/2020  ? ? ?Lab Results  ?Component Value Date  ? HGBA1C 6.9 (H) 06/14/2021  ? ?Last diabetic Eye exam:  ?Lab Results  ?Component Value Date/Time  ? HMDIABEYEEXA No Retinopathy 06/23/2020 12:00 AM  ?  ?Last diabetic Foot exam:  ?Lab Results  ?Component Value Date/Time  ? HMDIABFOOTEX yes 01/13/2010 12:00 AM  ?  ? ?   ?Component Value Date/Time  ? CHOL 114 03/15/2021 1058  ? TRIG 84 03/15/2021 1058  ? HDL 36 (L) 03/15/2021 1058  ? CHOLHDL 3.2 03/15/2021 1058  ? CHOLHDL 3.2 01/16/2020 0802  ? VLDL 17 08/11/2016 0821  ? [place] 61 03/15/2021 1058  ? [place] 70 01/16/2020 0802  ? ? ?Hepatic Function Latest Ref Rng & Units 03/15/2021 08/05/2020 01/16/2020  ?Total Protein 6.0 - 8.5 g/dL 6.8 7.1 6.6  ?Albumin 3.8 - 4.8 g/dL 4.1 4.3 -  ?AST 0 - 40 IU/L '15 20 19  ' ?ALT 0 - 44 IU/L '19 30 22  ' ?Alk Phosphatase 44 - 121 IU/L 141(H) 129(H) -  ?Total Bilirubin 0.0 - 1.2 mg/dL 0.4 0.5 0.4  ?Bilirubin, Direct 0.0 - 0.3 mg/dL - - -  ? ? ?Lab Results  ?Component Value Date/Time  ? TSH 1.420 03/15/2021 10:58 AM  ? TSH 1.76 11/04/2018 07:51 AM  ? ? ?CBC Latest Ref Rng & Units 08/05/2020 01/16/2020 04/02/2018  ?WBC 3.4 - 10.8 x10E3/uL 6.2 5.1 5.4  ?Hemoglobin 13.0 - 17.7 g/dL 13.6 13.2 13.2  ?Hematocrit 37.5 - 51.0 % 40.5 39.7 41.0  ?Platelets 150 - 450 x10E3/uL 224 228 268  ? ? ?Lab Results  ?Component Value Date/Time  ?  VD25OH 26 (L) 11/04/2018 07:51 AM  ? VD25OH 23 (L) 11/21/2017 07:23 AM  ? ? ?Clinical ASCVD: No  ?The ASCVD Risk score (Arnett DK, et al., 2019) failed to calculate for the following reasons: ?  The valid total cholesterol range is 130 to 320 mg/dL   ? ?Social History  ? ?Tobacco Use  ?Smoking Status Former  ? Packs/day: 1.00  ? Years: 30.00  ? Pack years: 30.00  ? Types: Cigarettes  ? Start date: 04/03/1966  ? Quit date: 09/02/1983  ? Years since quitting: 37.8  ?Smokeless Tobacco Never  ? ?BP Readings from Last 3 Encounters:  ?06/14/21 122/67  ?06/03/21  118/68  ?02/11/21 130/72  ? ?Pulse Readings from Last 3 Encounters:  ?06/14/21 67  ?06/03/21 82  ?02/11/21 77  ? ?Wt Readings from Last 3 Encounters:  ?06/14/21 210 lb (95.3 kg)  ?06/03/21 215 lb 3.2 oz (97.6 kg)  ?02/11/21 226 lb (102.5 kg)  ? ? ?Assessment: Review of patient past medical history, allergies, medications, health status, including review of consultants reports, laboratory and other test data, was performed as part of comprehensive evaluation and provision of chronic care management services.  ? ?SDOH:  (Social Determinants of Health) assessments and interventions performed:  ? ? ?CCM Care Plan ? ?Allergies  ?Allergen Reactions  ? Metformin And Related Other (See Comments)  ?  Stomach upset  ? Rosuvastatin Other (See Comments)  ?  Adverse GI symptoms  ? Codeine   ?  "Did not work" and "made me feel foolish"  ? ? ?Medications Reviewed Today   ? ? Reviewed by Beryle Lathe, [facility] (Pharmacist) on 06/15/21 at [place] List Status: <None>  ? ?Medication Order Taking? Sig Documenting Provider Last Dose Status Informant  ?ACCU-CHEK FASTCLIX LANCETS MISC [id number] Yes Use up to three times daily Dx e11.22 Fayrene Helper, MD Taking Active   ?acetaminophen (TYLENOL) 500 MG tablet [id number] Yes Take 500 mg by mouth as needed. [provider] Taking Active   ?aspirin 81 MG tablet [id number] Yes Take 81 mg by mouth daily. [provider] Taking Active Self  ?atorvastatin (LIPITOR) 40 MG tablet [id number] Yes TAKE 1 TABLET BY MOUTH  DAILY Branch, Alphonse Guild, MD Taking Active   ?Blood Glucose Monitoring Suppl (ACCU-CHEK NANO SMARTVIEW) w/Device KIT [id number] Yes TEST 1 TO 2 TIMES DAILY Fayrene Helper, MD Taking Active   ?Continuous Blood Gluc Sensor (FREESTYLE LIBRE 2 SENSOR) MISC [id number] Yes Use to monitor blood glucose continuously. Change sensor every 14 days. Fayrene Helper, MD Taking Active   ?Dulaglutide (TRULICITY) 1.5 GY/6.5LD SOPN [id number] Yes Inject 1.5 mg into the  skin once a week. Fayrene Helper, MD Taking Active   ?         ?Med Note Beryle Lathe   Fri May 20, 2021  8:49 AM)    ?glucose blood (ACCU-CHEK SMARTVIEW) test strip [id number] Yes USE AS INSTRUCTED 3 TIMES  DAILY Fayrene Helper, MD Taking Active   ?insulin glargine (LANTUS SOLOSTAR) 100 UNIT/ML Solostar Pen [id number] Yes Inject 40 Units into the skin at bedtime.  ?Patient taking differently: Inject 35 Units into the skin at bedtime.  ? Fayrene Helper, MD Taking Active   ?Insulin Pen Needle (UNIFINE PENTIPS) 31G X 8 MM MISC [id number] Yes Use as directed once daily Fayrene Helper, MD Taking Active   ?meclizine (ANTIVERT) 25 MG tablet [id number] No Take 1 tablet (25 mg total) by mouth 3 (three) times daily as  needed for dizziness.  ?Patient not taking: Reported on 06/14/2021  ? Fayrene Helper, MD Not Taking Active   ?metoprolol succinate (TOPROL-XL) 50 MG 24 hr tablet [id number] Yes TAKE 1 TABLET BY MOUTH  DAILY WITH OR IMMEDIATELY  AFTER A MEAL Fayrene Helper, MD Taking Active   ?nitroGLYCERIN (NITROSTAT) 0.4 MG SL tablet [id number] No Place 1 tablet (0.4 mg total) under the tongue every 5 (five) minutes as needed for chest pain. Ahmed Prima [place] M, PA-C Unknown Active   ?nystatin (MYCOSTATIN/NYSTOP) powder [id number] No Apply topically between toes  ?Patient not taking: Reported on 06/15/2021  ? [provider] Not Taking Active   ?olmesartan (BENICAR) 20 MG tablet [id number] Yes TAKE 1 TABLET BY MOUTH  DAILY Branch, Alphonse Guild, MD Taking Active   ?sildenafil (VIAGRA) 100 MG tablet [id number] Yes Take 1 tablet  by mouth daily as needed for erectile dysfunction. Fayrene Helper, MD Taking Active   ?triamcinolone ointment (KENALOG) 0.1 % [id number] No Apply topically.  ?Patient not taking: Reported on 06/14/2021  ? [provider] Not Taking Active   ? ?  ?  ? ?  ? ? ?Patient Active Problem List  ? Diagnosis Date Noted  ? Light headedness 02/12/2021  ? Asteatotic  eczema 12/16/2020  ? Rash 05/04/2020  ? Knee pain, right 11/05/2018  ? Type 2 diabetes mellitus with vascular disease ([place]) 03/05/2018  ? Annual physical exam 08/15/2016  ? Hypersomnia with sleep apnea 10/15/2

## 2021-06-20 DIAGNOSIS — E119 Type 2 diabetes mellitus without complications: Secondary | ICD-10-CM | POA: Diagnosis not present

## 2021-06-20 LAB — HM DIABETES EYE EXAM

## 2021-06-26 ENCOUNTER — Other Ambulatory Visit (HOSPITAL_COMMUNITY): Payer: Self-pay

## 2021-06-26 ENCOUNTER — Other Ambulatory Visit: Payer: Self-pay | Admitting: Family Medicine

## 2021-06-26 MED FILL — Atorvastatin Calcium Tab 40 MG (Base Equivalent): ORAL | 90 days supply | Qty: 90 | Fill #0 | Status: AC

## 2021-06-26 MED FILL — Olmesartan Medoxomil Tab 20 MG: ORAL | 90 days supply | Qty: 90 | Fill #0 | Status: AC

## 2021-06-27 ENCOUNTER — Other Ambulatory Visit (HOSPITAL_COMMUNITY): Payer: Self-pay

## 2021-06-27 MED ORDER — UNIFINE PENTIPS 31G X 8 MM MISC
0 refills | Status: DC
Start: 2021-06-27 — End: 2021-11-16
  Filled 2021-06-27 – 2021-07-21 (×2): qty 100, 90d supply, fill #0

## 2021-07-01 ENCOUNTER — Other Ambulatory Visit (HOSPITAL_COMMUNITY): Payer: Self-pay

## 2021-07-01 DIAGNOSIS — Z794 Long term (current) use of insulin: Secondary | ICD-10-CM | POA: Diagnosis not present

## 2021-07-01 DIAGNOSIS — I10 Essential (primary) hypertension: Secondary | ICD-10-CM | POA: Diagnosis not present

## 2021-07-01 DIAGNOSIS — E1169 Type 2 diabetes mellitus with other specified complication: Secondary | ICD-10-CM

## 2021-07-14 ENCOUNTER — Telehealth: Payer: Self-pay

## 2021-07-14 NOTE — Telephone Encounter (Signed)
Shoes ordered - Apex X801M 11.5W ?

## 2021-07-22 ENCOUNTER — Other Ambulatory Visit (HOSPITAL_COMMUNITY): Payer: Self-pay

## 2021-07-29 ENCOUNTER — Ambulatory Visit (INDEPENDENT_AMBULATORY_CARE_PROVIDER_SITE_OTHER): Payer: HMO

## 2021-07-29 ENCOUNTER — Ambulatory Visit
Admission: EM | Admit: 2021-07-29 | Discharge: 2021-07-29 | Disposition: A | Discharge status: Home / Self Care | Payer: HMO | Attending: Nurse Practitioner | Admitting: Nurse Practitioner

## 2021-07-29 ENCOUNTER — Ambulatory Visit: Admission: EM | Admit: 2021-07-29 | Discharge status: Absent without leave | Payer: HMO

## 2021-07-29 VITALS — BP 137/74 | HR 83 | Temp 98.4°F | Resp 18

## 2021-07-29 DIAGNOSIS — G8929 Other chronic pain: Secondary | ICD-10-CM

## 2021-07-29 DIAGNOSIS — M25561 Pain in right knee: Secondary | ICD-10-CM

## 2021-07-29 MED ORDER — MELOXICAM 7.5 MG PO TABS: 7.5000 mg | ORAL_TABLET | Freq: Every day | ORAL | 0 refills | Status: DC

## 2021-07-29 NOTE — ED Triage Notes (Signed)
Pt reports right knee pain x 1 year, worsens in the past month. Reports he was told in 2019 he needs a partial knee replacement, he did not follow up. Tylenol gives some relief.  ?

## 2021-07-29 NOTE — Discharge Instructions (Signed)
Your x-rays show severe degeneration of the right knee.  This is consistent with the discussion we had regarding your previous recommendation for partial knee replacement. ?Take medication as prescribed. ?You may need to wear your knee brace to help with support and compression. ?Follow-up with [facility] within the next 7 to 14 days for further evaluation. ?

## 2021-07-29 NOTE — ED Provider Notes (Signed)
?Robert [name] ? ? ? ?CSN: [id number] ?Arrival date & time: 07/29/21  1445 ? ? ?  ? ?History   ?Chief Complaint ?Chief Complaint  ?Patient presents with  ? Knee Pain  ?  Entered by patient  ? Appointment  ?  1500  ? ? ?HPI ?Robert [name] is a 71 y.o. male.  ? ?The patient is a 71 year old male who presents for right knee pain.  Symptoms have been present for several years.  He states that his knee pain has been bothering him more so over the past year with worsening over the past 2 to 3 weeks.  Patient states he has been seen by [facility] orthopedics and was told in 2019 that he needed a partial knee replacement.  Patient never followed up at that time.  Over the past 2 to 3 weeks, the patient has developed pain on the outside of his knee and he describes the pain as "shooting" stating that the pain goes up the outer portion of his right upper leg.  He states that he does have some swelling.  He denies numbness, tingling, radiation of pain.  Pain worsens with bending.  He denies any previous injury.  States he has been taking Tylenol 650 mg for his pain. ? ?The history is provided by the patient.  ? ?Past Medical History:  ?Diagnosis Date  ? Arteriosclerotic cardiovascular disease (ASCVD)   ? DES to LAD D1 - 2003 in [place]; failed intervention for a totally obstructed RCA at that time; EF of 45%; 12/2009: Equivocal stress nuclear with good exercise tolerance, negative stress EKG, normal EF with mild mid and distal inferior ischemia  ? Diabetes mellitus, insulin dependent (IDDM), controlled 10/25/2012  ? HBA1C is 6.9 on 10/22/2012, Phreesia 04/26/2020  ? Diabetes mellitus, type II ([place])   ? Hyperlipidemia   ? Lipid profile in 09/2011:128, 130, 33, 69  ? Hypertension   ? Lab  09/2011: Normal CMet ex G-133  ? Tobacco abuse, in remission   ? 30 pack years; quit in 1985  ? ? ?Patient Active Problem List  ? Diagnosis Date Noted  ? Light headedness 02/12/2021  ? Asteatotic eczema 12/16/2020  ? Rash 05/04/2020  ?  Knee pain, right 11/05/2018  ? Type 2 diabetes mellitus with vascular disease ([place]) 03/05/2018  ? Annual physical exam 08/15/2016  ? Hypersomnia with sleep apnea 01/16/2016  ? Seasonal allergies 01/04/2015  ? Tubular adenoma of colon 11/13/2014  ? ASCVD (arteriosclerotic cardiovascular disease) 08/21/2014  ? Bilateral hand pain 04/06/2014  ? Back pain 09/16/2013  ? Essential hypertension   ? Hyperlipidemia LDL goal <70   ? Obesity (BMI 30.0-34.9) 12/24/2011  ? ED (erectile dysfunction) 02/14/2011  ? Cardiovascular disease 09/27/2009  ? TOBACCO ABUSE, HX OF 09/27/2009  ? ? ?Past Surgical History:  ?Procedure Laterality Date  ? CARDIAC CATHETERIZATION    ? COLONOSCOPY  2010  ? Negative screening study  ? COLONOSCOPY N/A 11/11/2014  ? Procedure: COLONOSCOPY;  Surgeon: Rogene Houston, MD;  Location: [facility];  Service: Endoscopy;  Laterality: N/A;  1030  ? EPIDIDYMIS SURGERY  1996  ? KNEE ARTHROSCOPY W/ MENISCECTOMY  11/2008  ? Right  ? PRESSURE ULCER DEBRIDEMENT  2006  ? Right lower extremity  ? ROTATOR CUFF REPAIR  07/2009  ? Right  ? ? ? ? ? ?Home Medications   ? ?Prior to Admission medications   ?Medication Sig Start Date End Date Taking? Authorizing Provider  ?meloxicam (MOBIC) 7.5 MG tablet  Take 1 tablet (7.5 mg total) by mouth daily. 07/29/21  Yes Bethan Adamek-Warren, Alda Lea, NP  ?ACCU-CHEK FASTCLIX LANCETS MISC Use up to three times daily Dx e11.22 11/29/16   Fayrene Helper, MD  ?acetaminophen (TYLENOL) 500 MG tablet Take 500 mg by mouth as needed.    [provider]  ?aspirin 81 MG tablet Take 81 mg by mouth daily.    [provider]  ?atorvastatin (LIPITOR) 40 MG tablet TAKE 1 TABLET BY MOUTH  DAILY 01/31/21   Arnoldo Lenis, MD  ?Blood Glucose Monitoring Suppl (ACCU-CHEK NANO SMARTVIEW) w/Device KIT TEST 1 TO 2 TIMES DAILY 03/01/17   Fayrene Helper, MD  ?Continuous Blood Gluc Sensor (FREESTYLE LIBRE 2 SENSOR) MISC Use to monitor blood glucose continuously. Change sensor every 14  days. 05/20/21   Fayrene Helper, MD  ?Dulaglutide (TRULICITY) 1.5 YF/1.1MY SOPN Inject 1.5 mg into the skin once a week. 05/04/21   Fayrene Helper, MD  ?glucose blood (ACCU-CHEK SMARTVIEW) test strip USE AS INSTRUCTED 3 TIMES  DAILY 02/22/21   Fayrene Helper, MD  ?insulin glargine (LANTUS SOLOSTAR) 100 UNIT/ML Solostar Pen Inject 35 Units into the skin at bedtime. 06/15/21   Fayrene Helper, MD  ?Insulin Pen Needle (UNIFINE PENTIPS) 31G X 8 MM MISC Use as directed once daily 06/27/21   Fayrene Helper, MD  ?meclizine (ANTIVERT) 25 MG tablet Take 1 tablet (25 mg total) by mouth 3 (three) times daily as needed for dizziness. ?Patient not taking: Reported on 06/14/2021 02/11/21   Fayrene Helper, MD  ?metoprolol succinate (TOPROL-XL) 50 MG 24 hr tablet TAKE 1 TABLET BY MOUTH  DAILY WITH OR IMMEDIATELY  AFTER A MEAL 05/23/21   Fayrene Helper, MD  ?nitroGLYCERIN (NITROSTAT) 0.4 MG SL tablet Place 1 tablet (0.4 mg total) under the tongue every 5 (five) minutes as needed for chest pain. 02/12/19   Strader, Fransisco Hertz, PA-C  ?nystatin (MYCOSTATIN/NYSTOP) powder Apply topically between toes ?Patient not taking: Reported on 06/15/2021    [provider]  ?olmesartan (BENICAR) 20 MG tablet TAKE 1 TABLET BY MOUTH  DAILY 01/31/21   Arnoldo Lenis, MD  ?sildenafil (VIAGRA) 100 MG tablet Take 1 tablet  by mouth daily as needed for erectile dysfunction. 08/11/20   Fayrene Helper, MD  ?triamcinolone ointment (KENALOG) 0.1 % Apply topically. ?Patient not taking: Reported on 06/14/2021 01/27/21   [provider]  ? ? ?Family History ?Family History  ?Problem Relation Age of Onset  ? Diabetes Sister   ? Arthritis Brother   ? Diabetes Brother   ? Arthritis Mother   ? Cancer Father   ?     Hypernephroma  ? ? ?Social History ?Social History  ? ?Tobacco Use  ? Smoking status: Former  ?  Packs/day: 1.00  ?  Years: 30.00  ?  Pack years: 30.00  ?  Types: Cigarettes  ?  Start date: 04/03/1966  ?   Quit date: 09/02/1983  ?  Years since quitting: 37.9  ? Smokeless tobacco: Never  ?Vaping Use  ? Vaping Use: Never used  ?Substance Use Topics  ? Alcohol use: No  ?  Alcohol/week: 0.0 standard drinks  ?  Comment: Former Tax inspector -discontinued use in 2005  ? Drug use: No  ? ? ? ?Allergies   ?Metformin and related, Rosuvastatin, and Codeine ? ? ?Review of Systems ?Review of Systems  ?Constitutional: Negative.   ?Musculoskeletal:   ?     Right knee pain  ?  Skin: Negative.   ?Psychiatric/Behavioral: Negative.    ? ? ?Physical Exam ?Triage Vital Signs ?ED Triage Vitals  ?Enc Vitals Group  ?   BP 07/29/21 1457 137/74  ?   Pulse Rate 07/29/21 1457 83  ?   Resp 07/29/21 1457 18  ?   Temp 07/29/21 1457 98.4 ?F (36.9 ?C)  ?   Temp Source 07/29/21 1457 Oral  ?   SpO2 07/29/21 1457 95 %  ?   Weight --   ?   Height --   ?   Head Circumference --   ?   Peak Flow --   ?   Pain Score 07/29/21 1456 6  ?   Pain Loc --   ?   Pain Edu? --   ?   Excl. in [place]? --   ? ?No data found. ? ?Updated Vital Signs ?BP 137/74 (BP Location: Right Arm)   Pulse 83   Temp 98.4 ?F (36.9 ?C) (Oral)   Resp 18   SpO2 95%  ? ?Visual Acuity ?Right Eye Distance:   ?Left Eye Distance:   ?Bilateral Distance:   ? ?Right Eye Near:   ?Left Eye Near:    ?Bilateral Near:    ? ?Physical Exam ? ? ?UC Treatments / Results  ?Labs ?(all labs ordered are listed, but only abnormal results are displayed) ?Labs Reviewed - No data to display ? ?EKG ? ? ?Radiology ?DG Knee Complete 4 Views Right ? ?Result Date: 07/29/2021 ?CLINICAL DATA:  Chronic right knee pain. EXAM: RIGHT KNEE - COMPLETE 4+ VIEW COMPARISON:  None. FINDINGS: No evidence of fracture, dislocation, or joint effusion. Severe narrowing of medial joint space is noted with osteophyte formation. Soft tissues are unremarkable. IMPRESSION: Severe degenerative joint disease is noted medially. No acute abnormality is noted. Electronically Signed   By: Marijo Conception M.D.   On: 07/29/2021 15:28    ? ?Procedures ?Procedures (including critical care time) ? ?Medications Ordered in UC ?Medications - No data to display ? ?Initial Impression / Assessment and Plan / UC Course  ?I have reviewed the triage vital signs and the n

## 2021-08-05 DIAGNOSIS — M25561 Pain in right knee: Secondary | ICD-10-CM | POA: Diagnosis not present

## 2021-08-24 ENCOUNTER — Other Ambulatory Visit (HOSPITAL_COMMUNITY): Payer: Self-pay

## 2021-08-24 ENCOUNTER — Ambulatory Visit (INDEPENDENT_AMBULATORY_CARE_PROVIDER_SITE_OTHER): Payer: HMO

## 2021-08-24 DIAGNOSIS — E1151 Type 2 diabetes mellitus with diabetic peripheral angiopathy without gangrene: Secondary | ICD-10-CM | POA: Diagnosis not present

## 2021-08-24 DIAGNOSIS — L84 Corns and callosities: Secondary | ICD-10-CM

## 2021-08-24 NOTE — Progress Notes (Signed)
SITUATION Reason for Visit: Fitting of Diabetic Shoes & Insoles Patient / Caregiver Report:  Patient is satisfied with fit and function of shoes and insoles.  OBJECTIVE DATA: Patient History / Diagnosis:     ICD-10-CM   1. Diabetes mellitus type 2 with peripheral artery disease (HCC)  E11.51     2. Callus  L84       Change in Status:   None  ACTIONS PERFORMED: In-Person Delivery, patient was fit with: - 1x pair [id number] PDAC approved prefabricated Diabetic Shoes: Apex X801M 11.5W - 3x pair [id number] PDAC approved vacuum formed custom diabetic insoles; [employer]: [id number]  Shoes and insoles were verified for structural integrity and safety. Patient wore shoes and insoles in office. Skin was inspected and free of areas of concern after wearing shoes and inserts. Shoes and inserts fit properly. Patient / Caregiver provided with ferbal instruction and demonstration regarding donning, doffing, wear, care, proper fit, function, purpose, cleaning, and use of shoes and insoles ' and in all related precautions and risks and benefits regarding shoes and insoles. Patient / Caregiver was instructed to wear properly fitting socks with shoes at all times. Patient was also provided with verbal instruction regarding how to report any failures or malfunctions of shoes or inserts, and necessary follow up care. Patient / Caregiver was also instructed to contact physician regarding change in status that may affect function of shoes and inserts.   Patient / Caregiver verbalized undersatnding of instruction provided. Patient / Caregiver demonstrated independence with proper donning and doffing of shoes and inserts.  PLAN Patient to follow with treating physician as recommended. Plan of care was discussed with and agreed upon by patient and/or caregiver. All questions were answered and concerns addressed.

## 2021-08-30 ENCOUNTER — Ambulatory Visit (INDEPENDENT_AMBULATORY_CARE_PROVIDER_SITE_OTHER): Payer: HMO | Admitting: Podiatry

## 2021-08-30 ENCOUNTER — Encounter: Payer: Self-pay | Admitting: Podiatry

## 2021-08-30 DIAGNOSIS — L84 Corns and callosities: Secondary | ICD-10-CM

## 2021-08-30 DIAGNOSIS — M79675 Pain in left toe(s): Secondary | ICD-10-CM

## 2021-08-30 DIAGNOSIS — B353 Tinea pedis: Secondary | ICD-10-CM | POA: Diagnosis not present

## 2021-08-30 DIAGNOSIS — E1151 Type 2 diabetes mellitus with diabetic peripheral angiopathy without gangrene: Secondary | ICD-10-CM | POA: Diagnosis not present

## 2021-08-30 DIAGNOSIS — Q828 Other specified congenital malformations of skin: Secondary | ICD-10-CM

## 2021-08-30 DIAGNOSIS — M79674 Pain in right toe(s): Secondary | ICD-10-CM

## 2021-08-30 DIAGNOSIS — B351 Tinea unguium: Secondary | ICD-10-CM | POA: Diagnosis not present

## 2021-08-30 NOTE — Progress Notes (Unsigned)
  Subjective:  Patient ID: Robert [name], male    DOB: [birth year],  MRN: [id number]  Robert [name] presents to clinic today for at risk foot care. Pt has h/o NIDDM with PAD and painful porokeratotic lesion(s) b/l lower extremities and painful mycotic toenails that limit ambulation. Painful toenails interfere with ambulation. Aggravating factors include wearing enclosed shoe gear. Pain is relieved with periodic professional debridement. Painful porokeratotic lesions are aggravated when weightbearing with and without shoegear. Pain is relieved with periodic professional debridement.  Patient states blood glucose was 123 mg/dl.  Last known HgA1c was 6.9%.  New problem(s): None.   PCP is Robert Helper, MD , and last visit was July 01, 2021.  Allergies  Allergen Reactions   Metformin And Related Other (See Comments)    Stomach upset   Rosuvastatin Other (See Comments)    Adverse GI symptoms   Codeine     "Did not work" and "made me feel foolish"    Review of Systems: Negative except as noted in the HPI.  Objective: No changes noted in today's physical examination. General: Patient is a pleasant 71 y.o. African American male WD, WN in NAD. AAO x 3.   Neurovascular Examination: CFT immediate b/l LE. Faintly palpable DP/PT pulses b/l LE. Digital hair present b/l. Skin temperature gradient WNL b/l. No pain with calf compression b/l. No edema noted b/l. Mild clubbing of digits 2-5 b/l.  Protective sensation intact 5/5 intact bilaterally with 10g monofilament b/l. Vibratory sensation intact b/l.  Dermatological:  Pedal integument with normal turgor, texture and tone BLE. No open wounds b/l LE. No interdigital macerations noted b/l LE. Toenails 1-5 b/l elongated, discolored, dystrophic, thickened, crumbly with subungual debris and tenderness to dorsal palpation. Hyperkeratotic lesion(s) submet head 5 left foot.  No erythema, no edema, no drainage, no fluctuance. Porokeratotic lesion(s)  mlultiple, on plantar aspect of both feet spread out geometrically. No erythema, no edema, no drainage, no fluctuance.  Musculoskeletal:  Normal muscle strength 5/5 to all lower extremity muscle groups bilaterally. Pes planus deformity noted bilateral LE.Marland Kitchen No pain, crepitus or joint limitation noted with ROM b/l LE.  Patient ambulates independently without assistive aids.     Latest Ref Rng & Units 06/14/2021    9:52 AM 03/15/2021   10:58 AM 12/13/2020   11:46 AM  Hemoglobin A1C  Hemoglobin-A1c 4.8 - 5.6 % 6.9   7.7   7.9     Assessment/Plan: 1. Pain due to onychomycosis of toenails of both feet   2. Callus   3. Porokeratosis   4. Diabetes mellitus type 2 with peripheral artery disease ([place])     -Patient was evaluated and treated. All patient's and/or POA's questions/concerns answered on today's visit. -Patient to continue soft, supportive shoe gear daily. -Toenails 1-5 b/l were debrided in length and girth with sterile nail nippers and dremel without iatrogenic bleeding.  -Patient/POA to call should there be question/concern in the interim.   Return in about 3 months (around 11/30/2021).  Robert [name], DPM

## 2021-09-05 ENCOUNTER — Other Ambulatory Visit (HOSPITAL_COMMUNITY): Payer: Self-pay

## 2021-09-19 ENCOUNTER — Other Ambulatory Visit (HOSPITAL_COMMUNITY): Payer: Self-pay

## 2021-10-13 ENCOUNTER — Other Ambulatory Visit (HOSPITAL_COMMUNITY): Payer: Self-pay

## 2021-10-13 ENCOUNTER — Other Ambulatory Visit: Payer: Self-pay | Admitting: Family Medicine

## 2021-10-13 DIAGNOSIS — Z794 Long term (current) use of insulin: Secondary | ICD-10-CM

## 2021-10-13 MED FILL — Atorvastatin Calcium Tab 40 MG (Base Equivalent): ORAL | 90 days supply | Qty: 90 | Fill #1 | Status: AC

## 2021-10-13 MED FILL — Olmesartan Medoxomil Tab 20 MG: ORAL | 90 days supply | Qty: 90 | Fill #1 | Status: AC

## 2021-10-14 ENCOUNTER — Other Ambulatory Visit (HOSPITAL_COMMUNITY): Payer: Self-pay

## 2021-10-14 MED ORDER — TRULICITY 1.5 MG/0.5ML ~~LOC~~ SOAJ
1.5000 mg | SUBCUTANEOUS | 1 refills | Status: DC
  Filled 2021-10-14: qty 2, 28d supply, fill #0
  Filled 2021-11-14: qty 2, 28d supply, fill #1
  Filled 2021-12-08: qty 2, 28d supply, fill #2
  Filled 2022-01-07: qty 2, 28d supply, fill #3
  Filled 2022-02-01: qty 2, 28d supply, fill #4

## 2021-10-21 ENCOUNTER — Encounter (INDEPENDENT_AMBULATORY_CARE_PROVIDER_SITE_OTHER): Payer: Self-pay | Admitting: *Deleted

## 2021-10-21 ENCOUNTER — Other Ambulatory Visit (HOSPITAL_COMMUNITY): Payer: Self-pay

## 2021-10-31 ENCOUNTER — Other Ambulatory Visit (HOSPITAL_COMMUNITY): Payer: Self-pay

## 2021-11-02 ENCOUNTER — Other Ambulatory Visit (HOSPITAL_COMMUNITY): Payer: Self-pay

## 2021-11-14 ENCOUNTER — Other Ambulatory Visit: Payer: Self-pay | Admitting: Family Medicine

## 2021-11-14 DIAGNOSIS — Z794 Long term (current) use of insulin: Secondary | ICD-10-CM

## 2021-11-15 ENCOUNTER — Ambulatory Visit (INDEPENDENT_AMBULATORY_CARE_PROVIDER_SITE_OTHER): Payer: HMO | Admitting: Family Medicine

## 2021-11-15 ENCOUNTER — Encounter: Payer: Self-pay | Admitting: Family Medicine

## 2021-11-15 ENCOUNTER — Other Ambulatory Visit (HOSPITAL_COMMUNITY): Payer: Self-pay

## 2021-11-15 VITALS — BP 124/75 | HR 70 | Ht 69.0 in | Wt 203.1 lb

## 2021-11-15 DIAGNOSIS — M1711 Unilateral primary osteoarthritis, right knee: Secondary | ICD-10-CM | POA: Diagnosis not present

## 2021-11-15 DIAGNOSIS — E669 Obesity, unspecified: Secondary | ICD-10-CM

## 2021-11-15 DIAGNOSIS — R1319 Other dysphagia: Secondary | ICD-10-CM | POA: Diagnosis not present

## 2021-11-15 DIAGNOSIS — G8929 Other chronic pain: Secondary | ICD-10-CM | POA: Diagnosis not present

## 2021-11-15 DIAGNOSIS — E1159 Type 2 diabetes mellitus with other circulatory complications: Secondary | ICD-10-CM

## 2021-11-15 DIAGNOSIS — D126 Benign neoplasm of colon, unspecified: Secondary | ICD-10-CM | POA: Diagnosis not present

## 2021-11-15 DIAGNOSIS — M25561 Pain in right knee: Secondary | ICD-10-CM | POA: Diagnosis not present

## 2021-11-15 DIAGNOSIS — E785 Hyperlipidemia, unspecified: Secondary | ICD-10-CM

## 2021-11-15 DIAGNOSIS — I10 Essential (primary) hypertension: Secondary | ICD-10-CM | POA: Diagnosis not present

## 2021-11-15 DIAGNOSIS — M179 Osteoarthritis of knee, unspecified: Secondary | ICD-10-CM | POA: Insufficient documentation

## 2021-11-15 MED ORDER — FREESTYLE LIBRE 2 SENSOR MISC
5 refills | Status: DC
  Filled 2021-11-15: qty 2, 28d supply, fill #0
  Filled 2021-12-08: qty 2, 28d supply, fill #1
  Filled 2022-01-07: qty 2, 28d supply, fill #2
  Filled 2022-02-01: qty 2, 28d supply, fill #3
  Filled 2022-03-03: qty 2, 28d supply, fill #4
  Filled 2022-03-30 (×2): qty 2, 28d supply, fill #5

## 2021-11-15 NOTE — Assessment & Plan Note (Signed)
Hyperlipidemia:Low fat diet discussed and encouraged.   Lipid Panel  Lab Results  Component Value Date   CHOL 114 03/15/2021   HDL 36 (L) 03/15/2021   LDLCALC 61 03/15/2021   TRIG 84 03/15/2021   CHOLHDL 3.2 03/15/2021     Updated lab needed at/ before next visit.

## 2021-11-15 NOTE — Assessment & Plan Note (Signed)
Needs colonoscopy will refer

## 2021-11-15 NOTE — Assessment & Plan Note (Signed)
Robert [name] is reminded of the importance of commitment to daily physical activity for 30 minutes or more, as able and the need to limit carbohydrate intake to 30 to 60 grams per meal to help with blood sugar control.   The need to take medication as prescribed, test blood sugar as directed, and to call between visits if there is a concern that blood sugar is uncontrolled is also discussed.   Robert [name] is reminded of the importance of daily foot exam, annual eye examination, and good blood sugar, blood pressure and cholesterol control.     Latest Ref Rng & Units 06/14/2021    9:52 AM 03/15/2021   10:58 AM 12/13/2020   11:46 AM 08/05/2020    8:42 AM 04/22/2020   12:06 PM  Diabetic Labs  HbA1c 4.8 - 5.6 % 6.9  7.7  7.9  7.8  8.4   Micro/Creat Ratio 0 - 29 mg/g creat    55    Chol 100 - 199 mg/dL  114   120    HDL >39 mg/dL  36   46    Calc LDL 0 - 99 mg/dL  61   55    Triglycerides 0 - 149 mg/dL  84   99    Creatinine 0.76 - 1.27 mg/dL 1.28  1.31  1.21  1.26  1.32       11/15/2021   10:10 AM 07/29/2021    2:57 PM 06/14/2021    9:14 AM 06/03/2021    3:19 PM 02/11/2021    2:30 PM 02/11/2021    2:00 PM 12/17/2020    9:16 AM  BP/Weight  Systolic BP 695 072 257 505 183 358 251  Diastolic BP 75 74 67 68 72 75 72  Wt. (Lbs) 203.12  210 215.2  226   BMI 30 kg/m2  31.01 kg/m2 31.78 kg/m2  33.62 kg/m2       Latest Ref Rng & Units 06/20/2021   12:00 AM 12/17/2020    8:40 AM  Foot/eye exam completion dates  Eye Exam No Retinopathy No Retinopathy       Foot Form Completion   Done     This result is from an external source.      Updated lab needed at/ before next visit.

## 2021-11-15 NOTE — Assessment & Plan Note (Signed)
8 to 10 year h/o severe right knee pain has osteo requests Orthio eval, ready for surgery

## 2021-11-15 NOTE — Patient Instructions (Signed)
Annual exam 11/12 or after , call if you need me sooner  Call for flu vaccine `if you need me sooner  Lipid, cmp and EGFr and HBA1C today  It is important that you exercise regularly at least 30 minutes 5 times a week. If you develop chest pain, have severe difficulty breathing, or feel very tired, stop exercising immediately and seek medical attention    Please use CPAP needed it since 2021  HAPPY BIRTHDAY!!!     Thanks for choosing [facility], we consider it a privelige to serve you.

## 2021-11-15 NOTE — Assessment & Plan Note (Signed)
Controlled, no change in medication DASH diet and commitment to daily physical activity for a minimum of 30 minutes discussed and encouraged, as a part of hypertension management. The importance of attaining a healthy weight is also discussed.     11/15/2021   10:10 AM 07/29/2021    2:57 PM 06/14/2021    9:14 AM 06/03/2021    3:19 PM 02/11/2021    2:30 PM 02/11/2021    2:00 PM 12/17/2020    9:16 AM  BP/Weight  Systolic BP 426 834 196 222 979 892 119  Diastolic BP 75 74 67 68 72 75 72  Wt. (Lbs) 203.12  210 215.2  226   BMI 30 kg/m2  31.01 kg/m2 31.78 kg/m2  33.62 kg/m2

## 2021-11-15 NOTE — Progress Notes (Signed)
Robert [name]     MRN: [id number]      DOB: [birth year]   HPI Robert [name] is here for follow up and re-evaluation of chronic medical conditions, medication management and review of any available recent lab and radiology data.  Preventive health is updated, specifically  Cancer screening and Immunization.   Recent episodes of difficulty swallowing solids with food sticking andnneeds colonoscopy C/o increased pain and debility of right knee, requests Ortho referral Denies polyuria, polydipsia, blurred vision , or hypoglycemic episodes. Not using CPAP machine and knows that he should ROS Denies recent fever or chills. Denies sinus pressure, nasal congestion, ear pain or sore throat. Denies chest congestion, productive cough or wheezing. Denies chest pains, palpitations and leg swelling Denies abdominal pain, nausea, vomiting,diarrhea or constipation.   Denies dysuria, frequency, hesitancy or incontinence.  Denies headaches, seizures, numbness, or tingling. Denies depression, anxiety or insomnia. Denies skin break down or rash.   PE  BP 124/75 (BP Location: Right Arm, Patient Position: Sitting, Cuff Size: Large)   Pulse 70   Ht '5\' 9"'$  (1.753 m)   Wt 203 lb 1.9 oz (92.1 kg)   SpO2 95%   BMI 30.00 kg/m   Patient alert and oriented and in no cardiopulmonary distress.  HEENT: No facial asymmetry, EOMI,     Neck supple .  Chest: Clear to auscultation bilaterally.  CVS: S1, S2 no murmurs, no S3.Regular rate.  ABD: Soft non tender.   Ext: No edema  MS: Adequate ROM spine, shoulders, hips and reduced ROM right  knee.  Skin: Intact, no ulcerations or rash noted.  Psych: Good eye contact, normal affect. Memory intact not anxious or depressed appearing.  CNS: CN 2-12 intact, power,  normal throughout.no focal deficits noted.   Assessment & Plan  Knee pain, right 8 to 10 year h/o severe right knee pain has osteo requests Orthio eval, ready for surgery  Dysphagia Needs  upper endo  Tubular adenoma of colon Needs colonoscopy will refer  Essential hypertension Controlled, no change in medication DASH diet and commitment to daily physical activity for a minimum of 30 minutes discussed and encouraged, as a part of hypertension management. The importance of attaining a healthy weight is also discussed.     11/15/2021   10:10 AM 07/29/2021    2:57 PM 06/14/2021    9:14 AM 06/03/2021    3:19 PM 02/11/2021    2:30 PM 02/11/2021    2:00 PM 12/17/2020    9:16 AM  BP/Weight  Systolic BP 474 259 563 875 643 329 518  Diastolic BP 75 74 67 68 72 75 72  Wt. (Lbs) 203.12  210 215.2  226   BMI 30 kg/m2  31.01 kg/m2 31.78 kg/m2  33.62 kg/m2        Hyperlipidemia LDL goal <70 Hyperlipidemia:Low fat diet discussed and encouraged.   Lipid Panel  Lab Results  Component Value Date   CHOL 114 03/15/2021   HDL 36 (L) 03/15/2021   LDLCALC 61 03/15/2021   TRIG 84 03/15/2021   CHOLHDL 3.2 03/15/2021     Updated lab needed at/ before next visit.   Type 2 diabetes mellitus with vascular disease [facility]) Robert [name] is reminded of the importance of commitment to daily physical activity for 30 minutes or more, as able and the need to limit carbohydrate intake to 30 to 60 grams per meal to help with blood sugar control.   The need to take medication as prescribed, test blood sugar  as directed, and to call between visits if there is a concern that blood sugar is uncontrolled is also discussed.   Robert [name] is reminded of the importance of daily foot exam, annual eye examination, and good blood sugar, blood pressure and cholesterol control.     Latest Ref Rng & Units 06/14/2021    9:52 AM 03/15/2021   10:58 AM 12/13/2020   11:46 AM 08/05/2020    8:42 AM 04/22/2020   12:06 PM  Diabetic Labs  HbA1c 4.8 - 5.6 % 6.9  7.7  7.9  7.8  8.4   Micro/Creat Ratio 0 - 29 mg/g creat    55    Chol 100 - 199 mg/dL  114   120    HDL >39 mg/dL  36   46    Calc LDL 0 - 99 mg/dL  61    55    Triglycerides 0 - 149 mg/dL  84   99    Creatinine 0.76 - 1.27 mg/dL 1.28  1.31  1.21  1.26  1.32       11/15/2021   10:10 AM 07/29/2021    2:57 PM 06/14/2021    9:14 AM 06/03/2021    3:19 PM 02/11/2021    2:30 PM 02/11/2021    2:00 PM 12/17/2020    9:16 AM  BP/Weight  Systolic BP 115 726 203 559 741 638 453  Diastolic BP 75 74 67 68 72 75 72  Wt. (Lbs) 203.12  210 215.2  226   BMI 30 kg/m2  31.01 kg/m2 31.78 kg/m2  33.62 kg/m2       Latest Ref Rng & Units 06/20/2021   12:00 AM 12/17/2020    8:40 AM  Foot/eye exam completion dates  Eye Exam No Retinopathy No Retinopathy       Foot Form Completion   Done     This result is from an external source.      Updated lab needed at/ before next visit.   Obesity (BMI 30.0-34.9)  Patient re-educated about  the importance of commitment to a  minimum of 150 minutes of exercise per week as able.  The importance of healthy food choices with portion control discussed, as well as eating regularly and within a 12 hour window most days. The need to choose "clean , green" food 50 to 75% of the time is discussed, as well as to make water the primary drink and set a goal of 64 ounces water daily.       11/15/2021   10:10 AM 06/14/2021    9:14 AM 06/03/2021    3:19 PM  Weight /BMI  Weight 203 lb 1.9 oz 210 lb 215 lb 3.2 oz  Height '5\' 9"'$  (1.753 m) '5\' 9"'$  (1.753 m) '5\' 9"'$  (1.753 m)  BMI 30 kg/m2 31.01 kg/m2 31.78 kg/m2

## 2021-11-15 NOTE — Assessment & Plan Note (Signed)
  Patient re-educated about  the importance of commitment to a  minimum of 150 minutes of exercise per week as able.  The importance of healthy food choices with portion control discussed, as well as eating regularly and within a 12 hour window most days. The need to choose "clean , green" food 50 to 75% of the time is discussed, as well as to make water the primary drink and set a goal of 64 ounces water daily.       11/15/2021   10:10 AM 06/14/2021    9:14 AM 06/03/2021    3:19 PM  Weight /BMI  Weight 203 lb 1.9 oz 210 lb 215 lb 3.2 oz  Height '5\' 9"'$  (1.753 m) '5\' 9"'$  (1.753 m) '5\' 9"'$  (1.753 m)  BMI 30 kg/m2 31.01 kg/m2 31.78 kg/m2

## 2021-11-15 NOTE — Assessment & Plan Note (Signed)
Needs upper endo

## 2021-11-16 ENCOUNTER — Other Ambulatory Visit (HOSPITAL_COMMUNITY): Payer: Self-pay

## 2021-11-16 ENCOUNTER — Other Ambulatory Visit: Payer: Self-pay | Admitting: Family Medicine

## 2021-11-16 LAB — CMP14+EGFR
ALT: 23 IU/L (ref 0–44)
AST: 21 IU/L (ref 0–40)
Albumin/Globulin Ratio: 1.7 (ref 1.2–2.2)
Albumin: 4.4 g/dL (ref 3.9–4.9)
Alkaline Phosphatase: 121 IU/L (ref 44–121)
BUN/Creatinine Ratio: 13 (ref 10–24)
BUN: 14 mg/dL (ref 8–27)
Bilirubin Total: 0.3 mg/dL (ref 0.0–1.2)
CO2: 21 mmol/L (ref 20–29)
Calcium: 9.3 mg/dL (ref 8.6–10.2)
Chloride: 106 mmol/L (ref 96–106)
Creatinine, Ser: 1.05 mg/dL (ref 0.76–1.27)
Globulin, Total: 2.6 g/dL (ref 1.5–4.5)
Glucose: 98 mg/dL (ref 70–99)
Potassium: 4.7 mmol/L (ref 3.5–5.2)
Sodium: 144 mmol/L (ref 134–144)
Total Protein: 7 g/dL (ref 6.0–8.5)
eGFR: 77 mL/min/{1.73_m2} (ref >59)

## 2021-11-16 LAB — LIPID PANEL
Chol/HDL Ratio: 2.8 ratio (ref 0.0–5.0)
Cholesterol, Total: 128 mg/dL (ref 100–199)
HDL: 46 mg/dL (ref >39)
LDL Chol Calc (NIH): 65 mg/dL (ref 0–99)
Triglycerides: 86 mg/dL (ref 0–149)
VLDL Cholesterol Cal: 17 mg/dL (ref 5–40)

## 2021-11-16 LAB — HEMOGLOBIN A1C
Est. average glucose Bld gHb Est-mCnc: 146 mg/dL
Hgb A1c MFr Bld: 6.7 % — ABNORMAL HIGH (ref 4.8–5.6)

## 2021-11-16 MED ORDER — UNIFINE PENTIPS 31G X 8 MM MISC
0 refills | Status: DC
  Filled 2021-11-16: qty 100, 100d supply, fill #0

## 2021-11-18 ENCOUNTER — Other Ambulatory Visit (HOSPITAL_COMMUNITY): Payer: Self-pay

## 2021-11-30 ENCOUNTER — Ambulatory Visit: Payer: HMO | Admitting: Family Medicine

## 2021-11-30 ENCOUNTER — Ambulatory Visit: Payer: HMO | Admitting: Podiatry

## 2021-12-01 ENCOUNTER — Other Ambulatory Visit: Payer: Self-pay | Admitting: Family Medicine

## 2021-12-01 ENCOUNTER — Telehealth: Payer: Self-pay | Admitting: Family Medicine

## 2021-12-01 DIAGNOSIS — Z1211 Encounter for screening for malignant neoplasm of colon: Secondary | ICD-10-CM

## 2021-12-01 NOTE — Telephone Encounter (Signed)
Referral re entered to [facility]

## 2021-12-01 NOTE — Telephone Encounter (Signed)
Patient called in regard to [facility] referral   Patient has not hear anything in regard, needs to get colonoscopy  Wants a call back

## 2021-12-07 ENCOUNTER — Encounter (INDEPENDENT_AMBULATORY_CARE_PROVIDER_SITE_OTHER): Payer: Self-pay | Admitting: *Deleted

## 2021-12-08 ENCOUNTER — Other Ambulatory Visit (HOSPITAL_COMMUNITY): Payer: Self-pay

## 2021-12-08 MED FILL — Atorvastatin Calcium Tab 40 MG (Base Equivalent): ORAL | 90 days supply | Qty: 90 | Fill #2 | Status: CN

## 2021-12-08 MED FILL — Olmesartan Medoxomil Tab 20 MG: ORAL | 90 days supply | Qty: 90 | Fill #2 | Status: CN

## 2021-12-13 ENCOUNTER — Other Ambulatory Visit (HOSPITAL_COMMUNITY): Payer: Self-pay

## 2021-12-26 ENCOUNTER — Other Ambulatory Visit (INDEPENDENT_AMBULATORY_CARE_PROVIDER_SITE_OTHER): Payer: Self-pay

## 2021-12-26 ENCOUNTER — Encounter (INDEPENDENT_AMBULATORY_CARE_PROVIDER_SITE_OTHER): Payer: Self-pay

## 2021-12-26 ENCOUNTER — Telehealth (INDEPENDENT_AMBULATORY_CARE_PROVIDER_SITE_OTHER): Payer: Self-pay

## 2021-12-26 DIAGNOSIS — Z86010 Personal history of colonic polyps: Secondary | ICD-10-CM

## 2021-12-26 DIAGNOSIS — R1310 Dysphagia, unspecified: Secondary | ICD-10-CM

## 2021-12-26 MED ORDER — PEG 3350-KCL-NA BICARB-NACL 420 G PO SOLR: 4000.0000 mL | ORAL | 0 refills | Status: DC

## 2021-12-26 NOTE — Telephone Encounter (Signed)
Referring MD/PCP: Dr Tula Nakayama  Procedure: tcs/egd  Reason/Indication:  hx of colon polyps, fam hx of crc, dysphagia   Has patient had this procedure before?  Yes 11/17/2014  If so, when, by whom and where?    Is there a family history of colon cancer?  Yes   Who?  What age when diagnosed?  Brother  Is patient diabetic? If yes, Type 1 or Type 2   yes, type 2       Does patient have prosthetic heart valve or mechanical valve?  No   Do you have a pacemaker/defibrillator?  No   Has patient ever had endocarditis/atrial fibrillation? No   Does patient use oxygen? No   Has patient had joint replacement within last 12 months?  No   Is patient constipated or do they take laxatives? No   Does patient have a history of alcohol/drug use?  No   Have you had a stroke/heart attack last 6 mths? No   Do you take medicine for weight loss?  No   For male patients,: have you had a hysterectomy n/a                       are you post menopausal n/a                      do you still have your menstrual cycle n/a  Is patient on blood thinner such as Coumadin, Plavix and/or Aspirin? Yes   Medications: asa 81 mg daily, atorvastatin 40 mg daily, metoprolol 40 mg daily, olmesartan 20 mg daily, lantus 40 units daily, trulicity 1.5 mg once a week, acetaminophen 500 mg daily  Allergies: rosuvastatin , metformin, codeine   Medication Adjustment per Dr Jenetta Downer Hold trulicity 1 week prior to procedure, 1/2 dose of insulin the evening prior no diabetic medications the morning of   Procedure date & time: 01/05/22 at 7:30 am

## 2021-12-26 NOTE — Telephone Encounter (Signed)
Finesse Fielder Ann Lenzie Montesano, CMA  ?

## 2021-12-27 ENCOUNTER — Other Ambulatory Visit (HOSPITAL_COMMUNITY): Payer: Self-pay

## 2021-12-27 ENCOUNTER — Encounter (INDEPENDENT_AMBULATORY_CARE_PROVIDER_SITE_OTHER): Payer: Self-pay | Admitting: *Deleted

## 2021-12-28 DIAGNOSIS — M1711 Unilateral primary osteoarthritis, right knee: Secondary | ICD-10-CM | POA: Insufficient documentation

## 2021-12-30 ENCOUNTER — Encounter: Payer: Self-pay | Admitting: Podiatry

## 2021-12-30 ENCOUNTER — Ambulatory Visit (INDEPENDENT_AMBULATORY_CARE_PROVIDER_SITE_OTHER): Payer: HMO | Admitting: Podiatry

## 2021-12-30 DIAGNOSIS — E1151 Type 2 diabetes mellitus with diabetic peripheral angiopathy without gangrene: Secondary | ICD-10-CM | POA: Diagnosis not present

## 2021-12-30 DIAGNOSIS — M79675 Pain in left toe(s): Secondary | ICD-10-CM | POA: Diagnosis not present

## 2021-12-30 DIAGNOSIS — M79674 Pain in right toe(s): Secondary | ICD-10-CM

## 2021-12-30 DIAGNOSIS — E119 Type 2 diabetes mellitus without complications: Secondary | ICD-10-CM | POA: Diagnosis not present

## 2021-12-30 DIAGNOSIS — L84 Corns and callosities: Secondary | ICD-10-CM

## 2021-12-30 DIAGNOSIS — B351 Tinea unguium: Secondary | ICD-10-CM

## 2021-12-30 DIAGNOSIS — M2141 Flat foot [pes planus] (acquired), right foot: Secondary | ICD-10-CM | POA: Diagnosis not present

## 2021-12-30 DIAGNOSIS — M2142 Flat foot [pes planus] (acquired), left foot: Secondary | ICD-10-CM

## 2021-12-31 NOTE — Progress Notes (Signed)
ANNUAL DIABETIC FOOT EXAM  Subjective: Robert [name] presents today for annual diabetic foot examination.  Chief Complaint  Patient presents with   Nail Problem    Diabetic foot care BS-[id number] A1C-6.8 PCP-Margaret Simpson PCP VST-11/2021    Patient confirms h/o diabetes.  Patient relates 18 year h/o diabetes.  Patient denies any h/o foot wounds.  He has occasional tingling in his feet.  Risk factors: diabetes, PAD, HTN, CAD, hyperlipidemia, h/o tobacco use in remission.  Robert Helper, MD is patient's PCP. Last visit was November 15, 2021.  Patient states he is recovering from hand, foot and mouth disease he contracted from his granddaughter.  Past Medical History:  Diagnosis Date   Arteriosclerotic cardiovascular disease (ASCVD)    DES to LAD D1 - 2003 in [place]; failed intervention for a totally obstructed RCA at that time; EF of 45%; 12/2009: Equivocal stress nuclear with good exercise tolerance, negative stress EKG, normal EF with mild mid and distal inferior ischemia   Diabetes mellitus, insulin dependent (IDDM), controlled 10/25/2012   HBA1C is 6.9 on 10/22/2012, [employer] 04/26/2020   Diabetes mellitus, type II ([place])    Hyperlipidemia    Lipid profile in 09/2011:128, 130, 33, 69   Hypertension    Lab  09/2011: Normal CMet ex G-133   Tobacco abuse, in remission    30 pack years; quit in 1985   Patient Active Problem List   Diagnosis Date Noted   Osteoarthritis of right knee 11/15/2021   Light headedness 02/12/2021   Asteatotic eczema 12/16/2020   Rash 05/04/2020   Knee pain, right 11/05/2018   Type 2 diabetes mellitus with vascular disease ([place]) 03/05/2018   Dysphagia 03/05/2018   Annual physical exam 08/15/2016   Hypersomnia with sleep apnea 01/16/2016   Seasonal allergies 01/04/2015   Tubular adenoma of colon 11/13/2014   ASCVD (arteriosclerotic cardiovascular disease) 08/21/2014   Bilateral hand pain 04/06/2014   Back pain 09/16/2013   Essential  hypertension    Hyperlipidemia LDL goal <70    Obesity (BMI 30.0-34.9) 12/24/2011   ED (erectile dysfunction) 02/14/2011   Cardiovascular disease 09/27/2009   TOBACCO ABUSE, HX OF 09/27/2009   Past Surgical History:  Procedure Laterality Date   CARDIAC CATHETERIZATION     COLONOSCOPY  2010   Negative screening study   COLONOSCOPY N/A 11/11/2014   Procedure: COLONOSCOPY;  Surgeon: Rogene Houston, MD;  Location: [facility];  Service: Endoscopy;  Laterality: N/A;  [facility] ARTHROSCOPY W/ MENISCECTOMY  11/2008   Right   PRESSURE ULCER DEBRIDEMENT  2006   Right lower extremity   ROTATOR CUFF REPAIR  07/2009   Right   Current Outpatient Medications on File Prior to Visit  Medication Sig Dispense Refill   acetaminophen (TYLENOL) 500 MG tablet Take 1,000 mg by mouth every 8 (eight) hours as needed for moderate pain or mild pain.     aspirin 81 MG tablet Take 81 mg by mouth daily.     atorvastatin (LIPITOR) 40 MG tablet TAKE 1 TABLET BY MOUTH  DAILY 90 tablet 3   Continuous Blood Gluc Sensor (FREESTYLE LIBRE 2 SENSOR) MISC Use to monitor blood glucose continuously. Change sensor every 14 days. 2 each 5   Dulaglutide (TRULICITY) 1.5 DD/2.2GU SOPN Inject 1.5 mg into the skin once a week. 6 mL 1   insulin glargine (LANTUS SOLOSTAR) 100 UNIT/ML Solostar Pen Inject 35 Units into the skin at bedtime. (Patient taking differently: Inject 40  Units into the skin at bedtime.)     Insulin Pen Needle (UNIFINE PENTIPS) 31G X 8 MM MISC Use as directed once daily 100 each 0   metoprolol succinate (TOPROL-XL) 50 MG 24 hr tablet TAKE 1 TABLET BY MOUTH  DAILY WITH OR IMMEDIATELY  AFTER A MEAL 90 tablet 3   nitroGLYCERIN (NITROSTAT) 0.4 MG SL tablet Place 1 tablet (0.4 mg total) under the tongue every 5 (five) minutes as needed for chest pain. 25 tablet 3   nystatin (MYCOSTATIN/NYSTOP) powder Apply 1 Application topically daily as needed (Apply topically between toes).      olmesartan (BENICAR) 20 MG tablet TAKE 1 TABLET BY MOUTH  DAILY 90 tablet 3   polyethylene glycol-electrolytes (TRILYTE) 420 g solution Take 4,000 mLs by mouth as directed. 4000 mL 0   sildenafil (VIAGRA) 100 MG tablet Take 1 tablet  by mouth daily as needed for erectile dysfunction. 10 tablet 3   No current facility-administered medications on file prior to visit.    Allergies  Allergen Reactions   Metformin And Related Other (See Comments)    Stomach upset   Oxycodone     Other reaction(s): Not available   Rosuvastatin Other (See Comments)    Adverse GI symptoms   Codeine     "Did not work" and "made me feel foolish"   Social History   Occupational History   Occupation: Retired  Tobacco Use   Smoking status: Former    Packs/day: 1.00    Years: 30.00    Total pack years: 30.00    Types: Cigarettes    Start date: 04/03/1966    Quit date: 09/02/1983    Years since quitting: 38.3   Smokeless tobacco: Never  Vaping Use   Vaping Use: Never used  Substance and Sexual Activity   Alcohol use: No    Alcohol/week: 0.0 standard drinks of alcohol    Comment: Former Tax inspector -discontinued use in 2005   Drug use: No   Sexual activity: Yes   Family History  Problem Relation Age of Onset   Diabetes Sister    Arthritis Brother    Diabetes Brother    Arthritis Mother    Cancer Father        Hypernephroma   Immunization History  Administered Date(s) Administered   Fluad Quad(high Dose 65+) 01/01/2019, 12/17/2020   Influenza Split 02/14/2011, 12/18/2011   Influenza Whole 12/25/2006, 12/23/2008, 01/13/2010   Influenza,inj,Quad PF,6+ Mos 01/10/2013, 12/02/2013, 01/26/2015, 12/09/2015, 11/30/2016, 12/31/2017, 12/13/2019   Moderna Covid-19 Vaccine Bivalent Booster 19yr & up 01/28/2021   PFIZER(Purple Top)SARS-COV-2 Vaccination 05/01/2019, 05/23/2019, 01/08/2020, 07/12/2020   Pneumococcal Conjugate-13 11/10/2013   Pneumococcal Polysaccharide-23 03/10/2004, 01/13/2010, 11/30/2016    Tdap 12/18/2011   Zoster Recombinat (Shingrix) 12/17/2019, 08/03/2020   Zoster, Live 12/18/2011     Review of Systems: Negative except as noted in the HPI.   Objective: There were no vitals filed for this visit.  Robert GERSTENBERGERis a pleasant 71y.o. male in NAD. AAO X 3.  Neurovascular Examination: CFT immediate b/l LE. Faintly palpable DP/PT pulses b/l LE. Digital hair present b/l. Skin temperature gradient WNL b/l. No pain with calf compression b/l. No edema noted b/l. Mild clubbing of digits 2-5 b/l.  Protective sensation intact 5/5 intact bilaterally with 10g monofilament b/l. Vibratory sensation intact b/l.  Dermatological:  Pedal integument with resolving hyperpigmented patches on the dorsum of both feet.  Webspaces clear b/l.  Hyperkeratotic lesion(s) submet head 5 left foot.  No erythema, no edema,  no drainage, no fluctuance. Porokeratotic lesion(s) multiple (total=3), on plantar aspect of both feet spread out geometrically. No erythema, no edema, no drainage, no fluctuance.  Musculoskeletal:  Normal muscle strength 5/5 to all lower extremity muscle groups bilaterally. Pes planus deformity noted bilateral LE.  No pain, crepitus or joint limitation noted with ROM b/l LE.  Patient ambulates independently without assistive aids.  Footwear Assessment: Does the patient wear appropriate shoes? Yes. Does the patient need inserts/orthotics? No.  ADA Risk Categorization: Low Risk :  Patient has all of the following: Intact protective sensation No prior foot ulcer  No severe deformity Pedal pulses present  Assessment: 1. Pain due to onychomycosis of toenails of both feet   2. Callus   3. Pes planus of both feet   4. Diabetes mellitus type 2 with peripheral artery disease ([place])   5. Encounter for diabetic foot exam [facility])      Plan: -Patient was evaluated and treated. All patient's and/or POA's questions/concerns answered on today's visit. -Diabetic foot examination  performed today. -Continue foot and shoe inspections daily. Monitor blood glucose per PCP/Endocrinologist's recommendations. -Patient to continue soft, supportive shoe gear daily. -Toenails 1-5 b/l were debrided in length and girth with sterile nail nippers and dremel without iatrogenic bleeding.  -Callus(es) submet head 5 left foot pared utilizing sterile scalpel blade without complication or incident. Total number debrided =1. -Patient/POA to call should there be question/concern in the interim. Return in about 3 months (around 03/31/2022).  Marzetta Board, DPM

## 2022-01-02 ENCOUNTER — Encounter: Payer: Self-pay | Admitting: Internal Medicine

## 2022-01-02 ENCOUNTER — Encounter (HOSPITAL_COMMUNITY): Payer: Self-pay | Admitting: Gastroenterology

## 2022-01-02 ENCOUNTER — Encounter (HOSPITAL_COMMUNITY)
Admission: RE | Admit: 2022-01-02 | Discharge: 2022-01-02 | Disposition: A | Discharge status: Home / Self Care | Payer: HMO | Source: Ambulatory Visit | Attending: Gastroenterology | Admitting: Gastroenterology

## 2022-01-02 ENCOUNTER — Ambulatory Visit (INDEPENDENT_AMBULATORY_CARE_PROVIDER_SITE_OTHER): Payer: HMO | Admitting: Internal Medicine

## 2022-01-02 DIAGNOSIS — Z0000 Encounter for general adult medical examination without abnormal findings: Secondary | ICD-10-CM

## 2022-01-02 NOTE — Progress Notes (Signed)
Subjective:  I connected with  Robert [name] on 01/02/22 by a video enabled telemedicine application and verified that I am speaking with the correct person using two identifiers.   I discussed the limitations of evaluation and management by telemedicine. The patient expressed understanding and agreed to proceed.    Robert [name] is a 71 y.o. male who presents for Medicare Annual/Subsequent preventive examination.  Review of Systems    Review of Systems  All other systems reviewed and are negative.   Objective:    Today's Vitals   01/02/22 1634  PainSc: 6    There is no height or weight on file to calculate BMI.     12/03/2020    2:08 PM 11/12/2017    3:29 PM 02/01/2016    1:40 PM 11/11/2014    9:36 AM  Advanced Directives  Does Patient Have a Medical Advance Directive? No Yes No No  Does patient want to make changes to medical advance directive?  Yes (ED - Information included in AVS)    Would patient like information on creating a medical advance directive?    Yes - Educational materials given    Current Medications (verified) Outpatient Encounter Medications as of 01/02/2022  Medication Sig   acetaminophen (TYLENOL) 500 MG tablet Take 1,000 mg by mouth every 8 (eight) hours as needed for moderate pain or mild pain.   aspirin 81 MG tablet Take 81 mg by mouth daily.   atorvastatin (LIPITOR) 40 MG tablet TAKE 1 TABLET BY MOUTH  DAILY   Continuous Blood Gluc Sensor (FREESTYLE LIBRE 2 SENSOR) MISC Use to monitor blood glucose continuously. Change sensor every 14 days.   Dulaglutide (TRULICITY) 1.5 JK/0.9FG SOPN Inject 1.5 mg into the skin once a week.   insulin glargine (LANTUS SOLOSTAR) 100 UNIT/ML Solostar Pen Inject 35 Units into the skin at bedtime. (Patient taking differently: Inject 40 Units into the skin at bedtime.)   Insulin Pen Needle (UNIFINE PENTIPS) 31G X 8 MM MISC Use as directed once daily   metoprolol succinate (TOPROL-XL) 50 MG 24 hr tablet TAKE 1 TABLET BY  MOUTH  DAILY WITH OR IMMEDIATELY  AFTER A MEAL   nitroGLYCERIN (NITROSTAT) 0.4 MG SL tablet Place 1 tablet (0.4 mg total) under the tongue every 5 (five) minutes as needed for chest pain.   nystatin (MYCOSTATIN/NYSTOP) powder Apply 1 Application topically daily as needed (Apply topically between toes).   olmesartan (BENICAR) 20 MG tablet TAKE 1 TABLET BY MOUTH  DAILY   polyethylene glycol-electrolytes (TRILYTE) 420 g solution Take 4,000 mLs by mouth as directed.   sildenafil (VIAGRA) 100 MG tablet Take 1 tablet  by mouth daily as needed for erectile dysfunction.   No facility-administered encounter medications on file as of 01/02/2022.    Allergies (verified) Metformin and related, Oxycodone, Rosuvastatin, and Codeine   History: Past Medical History:  Diagnosis Date   Arteriosclerotic cardiovascular disease (ASCVD)    DES to LAD D1 - 2003 in [place]; failed intervention for a totally obstructed RCA at that time; EF of 45%; 12/2009: Equivocal stress nuclear with good exercise tolerance, negative stress EKG, normal EF with mild mid and distal inferior ischemia   Arthritis    Diabetes mellitus, insulin dependent (IDDM), controlled 10/25/2012   HBA1C is 6.9 on 10/22/2012, [employer] 04/26/2020   Diabetes mellitus, type II ([place])    Hyperlipidemia    Lipid profile in 09/2011:128, 130, 33, 69   Hypertension    Lab  09/2011: Normal CMet ex  G-133   Sleep apnea    Tobacco abuse, in remission    30 pack years; quit in 1985   Past Surgical History:  Procedure Laterality Date   bone spur Left    foot big toe   CARDIAC CATHETERIZATION     COLONOSCOPY  04/03/2008   Negative screening study   COLONOSCOPY N/A 11/11/2014   Procedure: COLONOSCOPY;  Surgeon: Rogene Houston, MD;  Location: [facility];  Service: Endoscopy;  Laterality: N/A;  1030   EPIDIDYMIS SURGERY  04/03/1994   HERNIA REPAIR     KNEE ARTHROSCOPY W/ MENISCECTOMY  11/01/2008   Right   PRESSURE ULCER DEBRIDEMENT  04/03/2004    Right lower extremity   ROTATOR CUFF REPAIR  07/02/2009   Right   Family History  Problem Relation Age of Onset   Diabetes Sister    Arthritis Brother    Diabetes Brother    Arthritis Mother    Cancer Father        Hypernephroma   Social History   Socioeconomic History   Marital status: Married    Spouse name: Not on file   Number of children: 2   Years of education: Not on file   Highest education level: Some college, no degree  Occupational History   Occupation: Retired  Tobacco Use   Smoking status: Former    Packs/day: 1.00    Years: 30.00    Total pack years: 30.00    Types: Cigarettes    Start date: 04/03/1966    Quit date: 09/02/1983    Years since quitting: 38.3   Smokeless tobacco: Never  Vaping Use   Vaping Use: Never used  Substance and Sexual Activity   Alcohol use: No    Alcohol/week: 0.0 standard drinks of alcohol    Comment: Former Tax inspector -discontinued use in 2005   Drug use: No   Sexual activity: Yes  Other Topics Concern   Not on file  Social History Narrative   Not on file   Social Determinants of Health   Financial Resource Strain: Low Risk  (12/03/2020)   Overall Financial Resource Strain (CARDIA)    Difficulty of Paying Living Expenses: Not hard at all  Food Insecurity: No Food Insecurity (12/03/2020)   Hunger Vital Sign    Worried About Running Out of Food in the Last Year: Never true    [place] in the Last Year: Never true  Transportation Needs: No Transportation Needs (12/03/2020)   PRAPARE - Hydrologist (Medical): No    Lack of Transportation (Non-Medical): No  Physical Activity: Inactive (12/03/2020)   Exercise Vital Sign    Days of Exercise per Week: 0 days    Minutes of Exercise per Session: 0 min  Stress: No Stress Concern Present (12/03/2020)   Robert [name]    Feeling of Stress : Not at all  Social Connections: Unknown (12/03/2020)    Social Connection and Isolation Panel [NHANES]    Frequency of Communication with Friends and Family: Not on file    Frequency of Social Gatherings with Friends and Family: Twice a week    Attends Religious Services: More than 4 times per year    Active Member of [employer] or Organizations: Yes    Attends Music therapist: More than 4 times per year    Marital Status: Married    Tobacco Counseling Counseling given: Not Answered   Clinical Intake:  Pre-visit preparation completed: Yes  Pain : 0-10 Pain Score: 6  Pain Type: Chronic pain Pain Location: Knee Pain Orientation: Right Pain Descriptors / Indicators: Aching     Diabetes: Yes  How often do you need to have someone help you when you read instructions, pamphlets, or other written materials from your doctor or pharmacy?: 1 - Never What is the last grade level you completed in school?: community college  Diabetic?Yes    Activities of Daily Living    12/29/2021   10:18 PM 12/29/2021   10:39 AM  In your present state of health, do you have any difficulty performing the following activities:  Hearing? 0 0  Vision? 0 0  Difficulty concentrating or making decisions? 0 0  Walking or climbing stairs? 0 0  Dressing or bathing? 0 0  Doing errands, shopping? 0 0  Preparing Food and eating ? N N  Using the Toilet? N N  In the past six months, have you accidently leaked urine? N N  Do you have problems with loss of bowel control? N N  Managing your Medications? N N  Managing your Finances? N N  Housekeeping or managing your Housekeeping? N N    Patient Care Team: Fayrene Helper, MD as PCP - General Branch, Alphonse Guild, MD as PCP - Cardiology (Cardiology) Beryle Lathe, [facility] (Inactive) (Pharmacist)  Indicate any recent Medical Services you may have received from other than [facility] providers in the past year (date may be approximate).     Assessment:   This is a routine wellness examination for  Robert [name].  Hearing/Vision screen No results found.  Dietary issues and exercise activities discussed:     Goals Addressed   None    Depression Screen    11/15/2021   10:11 AM 06/14/2021    9:15 AM 12/17/2020    8:39 AM 12/03/2020    2:06 PM 08/11/2020    8:22 AM 06/02/2020    9:02 AM 04/29/2020    8:40 AM  PHQ 2/9 Scores  PHQ - 2 Score 0 0 1 0 2 0 1  PHQ- 9 Score     4      Fall Risk    12/29/2021   10:18 PM 12/29/2021   10:39 AM 11/15/2021   10:11 AM 06/14/2021    9:15 AM 02/11/2021    2:01 PM  Robert [name] in the past year? '1 1 1 '$ 0 0  Number falls in past yr: 0 0 0 0 0  Injury with Fall? 0 0 0 0 0  Risk for fall due to :   No Fall Risks No Fall Risks   Follow up   Falls evaluation completed Falls evaluation completed     FALL RISK PREVENTION PERTAINING TO THE HOME:  Any stairs in or around the home? Yes  If so, are there any without handrails? Yes  Home free of loose throw rugs in walkways, pet beds, electrical cords, etc? No  Adequate lighting in your home to reduce risk of falls? Yes   ASSISTIVE DEVICES UTILIZED TO PREVENT FALLS:  Life alert? No  Use of a cane, walker or w/c? No  Grab bars in the bathroom? Yes  Shower chair or bench in shower? No  Elevated toilet seat or a handicapped toilet? Yes     Cognitive Function:        01/02/2022    4:38 PM 12/03/2020    2:10 PM 11/25/2019    2:41 PM  11/20/2018    3:06 PM 11/12/2017    3:32 PM  6CIT Screen  What Year? 0 points 0 points 0 points 0 points 0 points  What month? 0 points 0 points 0 points 0 points 0 points  What time? 0 points 0 points 0 points 0 points 0 points  Count back from 20 0 points 0 points 0 points 0 points   Months in reverse 0 points 0 points 0 points 0 points   Repeat phrase 0 points 2 points 0 points 0 points   Total Score 0 points 2 points 0 points 0 points     Immunizations Immunization History  Administered Date(s) Administered   Fluad Quad(high Dose 65+) 01/01/2019,  12/17/2020   Influenza Split 02/14/2011, 12/18/2011   Influenza Whole 12/25/2006, 12/23/2008, 01/13/2010   Influenza,inj,Quad PF,6+ Mos 01/10/2013, 12/02/2013, 01/26/2015, 12/09/2015, 11/30/2016, 12/31/2017, 12/13/2019   Moderna Covid-19 Vaccine Bivalent Booster 62yr & up 01/28/2021   PFIZER(Purple Top)SARS-COV-2 Vaccination 05/01/2019, 05/23/2019, 01/08/2020, 07/12/2020   Pneumococcal Conjugate-13 11/10/2013   Pneumococcal Polysaccharide-23 03/10/2004, 01/13/2010, 11/30/2016   Tdap 12/18/2011   Zoster Recombinat (Shingrix) 12/17/2019, 08/03/2020   Zoster, Live 12/18/2011    TDAP status: Due, Education has been provided regarding the importance of this vaccine. Advised may receive this vaccine at local pharmacy or Health Dept. Aware to provide a copy of the vaccination record if obtained from local pharmacy or Health Dept. Verbalized acceptance and understanding.  Flu Vaccine status: Due, Education has been provided regarding the importance of this vaccine. Advised may receive this vaccine at local pharmacy or Health Dept. Aware to provide a copy of the vaccination record if obtained from local pharmacy or Health Dept. Verbalized acceptance and understanding.  Pneumococcal vaccine status: Up to date  Covid-19 vaccine status: Information provided on how to obtain vaccines.   Qualifies for Shingles Vaccine? Yes   Zostavax completed No   Shingrix Completed?: Yes  Screening Tests Health Maintenance  Topic Date Due   COVID-19 Vaccine (6 - Pfizer series) 05/31/2021   Diabetic kidney evaluation - Urine ACR  08/05/2021   INFLUENZA VACCINE  11/01/2021   COLONOSCOPY (Pts 45-465yrInsurance coverage will need to be confirmed)  11/10/2021   TETANUS/TDAP  12/17/2021   HEMOGLOBIN A1C  05/18/2022   OPHTHALMOLOGY EXAM  06/21/2022   Diabetic kidney evaluation - GFR measurement  11/16/2022   FOOT EXAM  12/31/2022   Pneumonia Vaccine 6569Years old  Completed   Hepatitis C Screening  Completed    Zoster Vaccines- Shingrix  Completed   HPV VACCINES  Aged Out    Health Maintenance  Health Maintenance Due  Topic Date Due   COVID-19 Vaccine (6 - Pfizer series) 05/31/2021   Diabetic kidney evaluation - Urine ACR  08/05/2021   INFLUENZA VACCINE  11/01/2021   COLONOSCOPY (Pts [phone] coverage will need to be confirmed)  11/10/2021   TETANUS/TDAP  12/17/2021    Colorectal cancer screening: Referral to GI placed 01/04/2022. Pt aware the office will call re: appt.  Lung Cancer Screening: (Low Dose CT Chest recommended if Age 51-53-80ars, 30 pack-year currently smoking OR have quit w/in 15years.) does not qualify.   Additional Screening:  Hepatitis C Screening: does qualify; Completed 12/31/2014  Vision Screening: Recommended annual ophthalmology exams for early detection of glaucoma and other disorders of the eye. Is the patient up to date with their annual eye exam?  Yes  Who is the provider or what is the name of the office in which the patient  attends annual eye exams? [facility] If pt is not established with a provider, would they like to be referred to a provider to establish care? No .   Dental Screening: Recommended annual dental exams for proper oral hygiene  Community Resource Referral / Chronic Care Management: CRR required this visit?  No   CCM required this visit?  No      Plan:     I have personally reviewed and noted the following in the patient's chart:   Medical and social history Use of alcohol, tobacco or illicit drugs  Current medications and supplements including opioid prescriptions. Patient is not currently taking opioid prescriptions. Functional ability and status Nutritional status Physical activity Advanced directives List of other physicians Hospitalizations, surgeries, and ER visits in previous 12 months Vitals Screenings to include cognitive, depression, and falls Referrals and appointments  In addition, I have reviewed and  discussed with patient certain preventive protocols, quality metrics, and best practice recommendations. A written personalized care plan for preventive services as well as general preventive health recommendations were provided to patient.     Lorene Dy, MD   01/02/2022

## 2022-01-02 NOTE — Patient Instructions (Signed)
  [name] , Thank you for taking time to come for your Medicare Wellness Visit. I appreciate your ongoing commitment to your health goals. Please review the following plan we discussed and let me know if I can assist you in the future.   These are the goals we discussed:  Please obtain recent vaccine records and bring them to your next appointment.    Goals      Medication Management     Patient Goals/Self-Care Activities Patient will:  Take medications as prescribed Check blood sugar continuously with continuous glucose monitor, document, and provide at future appointments Check blood pressure at least once daily, document, and provide at future appointments Target a minimum of 150 minutes of moderate intensity exercise weekly             This is a list of the screening recommended for you and due dates:  Health Maintenance  Topic Date Due   COVID-19 Vaccine (6 - Pfizer series) 05/31/2021   Yearly kidney health urinalysis for diabetes  08/05/2021   Flu Shot  11/01/2021   Colon Cancer Screening  11/10/2021   Tetanus Vaccine  12/17/2021   Hemoglobin A1C  05/18/2022   Eye exam for diabetics  06/21/2022   Yearly kidney function blood test for diabetes  11/16/2022   Complete foot exam   12/31/2022   Pneumonia Vaccine  Completed   Hepatitis C Screening: USPSTF Recommendation to screen - Ages 40-79 yo.  Completed   Zoster (Shingles) Vaccine  Completed   HPV Vaccine  Aged Out

## 2022-01-05 ENCOUNTER — Encounter (INDEPENDENT_AMBULATORY_CARE_PROVIDER_SITE_OTHER): Payer: Self-pay | Admitting: *Deleted

## 2022-01-05 ENCOUNTER — Encounter (HOSPITAL_COMMUNITY)
Admission: RE | Disposition: A | Discharge status: Home / Self Care | Payer: Self-pay | Source: Home / Self Care | Attending: Gastroenterology

## 2022-01-05 ENCOUNTER — Ambulatory Visit (HOSPITAL_COMMUNITY)
Admission: RE | Admit: 2022-01-05 | Discharge: 2022-01-05 | Disposition: A | Discharge status: Home / Self Care | Payer: HMO | Attending: Gastroenterology | Admitting: Gastroenterology

## 2022-01-05 ENCOUNTER — Ambulatory Visit (HOSPITAL_COMMUNITY): Payer: HMO | Admitting: Anesthesiology

## 2022-01-05 ENCOUNTER — Ambulatory Visit (HOSPITAL_BASED_OUTPATIENT_CLINIC_OR_DEPARTMENT_OTHER): Payer: HMO | Admitting: Anesthesiology

## 2022-01-05 DIAGNOSIS — Z86010 Personal history of colonic polyps: Secondary | ICD-10-CM

## 2022-01-05 DIAGNOSIS — Z7984 Long term (current) use of oral hypoglycemic drugs: Secondary | ICD-10-CM | POA: Diagnosis not present

## 2022-01-05 DIAGNOSIS — D124 Benign neoplasm of descending colon: Secondary | ICD-10-CM | POA: Insufficient documentation

## 2022-01-05 DIAGNOSIS — Z794 Long term (current) use of insulin: Secondary | ICD-10-CM | POA: Insufficient documentation

## 2022-01-05 DIAGNOSIS — K5730 Diverticulosis of large intestine without perforation or abscess without bleeding: Secondary | ICD-10-CM | POA: Insufficient documentation

## 2022-01-05 DIAGNOSIS — Z87891 Personal history of nicotine dependence: Secondary | ICD-10-CM | POA: Diagnosis not present

## 2022-01-05 DIAGNOSIS — E785 Hyperlipidemia, unspecified: Secondary | ICD-10-CM | POA: Diagnosis not present

## 2022-01-05 DIAGNOSIS — K449 Diaphragmatic hernia without obstruction or gangrene: Secondary | ICD-10-CM | POA: Diagnosis not present

## 2022-01-05 DIAGNOSIS — K317 Polyp of stomach and duodenum: Secondary | ICD-10-CM | POA: Insufficient documentation

## 2022-01-05 DIAGNOSIS — K5140 Inflammatory polyps of colon without complications: Secondary | ICD-10-CM | POA: Diagnosis not present

## 2022-01-05 DIAGNOSIS — I10 Essential (primary) hypertension: Secondary | ICD-10-CM | POA: Diagnosis not present

## 2022-01-05 DIAGNOSIS — I2510 Atherosclerotic heart disease of native coronary artery without angina pectoris: Secondary | ICD-10-CM | POA: Diagnosis not present

## 2022-01-05 DIAGNOSIS — K648 Other hemorrhoids: Secondary | ICD-10-CM | POA: Diagnosis not present

## 2022-01-05 DIAGNOSIS — K635 Polyp of colon: Secondary | ICD-10-CM

## 2022-01-05 DIAGNOSIS — R1310 Dysphagia, unspecified: Secondary | ICD-10-CM | POA: Insufficient documentation

## 2022-01-05 DIAGNOSIS — G4730 Sleep apnea, unspecified: Secondary | ICD-10-CM | POA: Diagnosis not present

## 2022-01-05 DIAGNOSIS — D125 Benign neoplasm of sigmoid colon: Secondary | ICD-10-CM | POA: Insufficient documentation

## 2022-01-05 DIAGNOSIS — Z955 Presence of coronary angioplasty implant and graft: Secondary | ICD-10-CM | POA: Diagnosis not present

## 2022-01-05 DIAGNOSIS — E119 Type 2 diabetes mellitus without complications: Secondary | ICD-10-CM | POA: Diagnosis not present

## 2022-01-05 DIAGNOSIS — Z09 Encounter for follow-up examination after completed treatment for conditions other than malignant neoplasm: Secondary | ICD-10-CM | POA: Diagnosis not present

## 2022-01-05 DIAGNOSIS — Z1211 Encounter for screening for malignant neoplasm of colon: Secondary | ICD-10-CM | POA: Diagnosis not present

## 2022-01-05 DIAGNOSIS — K3189 Other diseases of stomach and duodenum: Secondary | ICD-10-CM

## 2022-01-05 DIAGNOSIS — D126 Benign neoplasm of colon, unspecified: Secondary | ICD-10-CM

## 2022-01-05 HISTORY — DX: Sleep apnea, unspecified: G47.30

## 2022-01-05 HISTORY — PX: BIOPSY: SHX5522

## 2022-01-05 HISTORY — PX: COLONOSCOPY WITH PROPOFOL: SHX5780

## 2022-01-05 HISTORY — DX: Unspecified osteoarthritis, unspecified site: M19.90

## 2022-01-05 HISTORY — PX: POLYPECTOMY: SHX5525

## 2022-01-05 HISTORY — PX: ESOPHAGOGASTRODUODENOSCOPY (EGD) WITH PROPOFOL: SHX5813

## 2022-01-05 LAB — GLUCOSE, CAPILLARY: Glucose-Capillary: 104 mg/dL — ABNORMAL HIGH (ref 70–99)

## 2022-01-05 LAB — HM COLONOSCOPY

## 2022-01-05 SURGERY — COLONOSCOPY WITH PROPOFOL
Anesthesia: General

## 2022-01-05 MED ORDER — PROPOFOL 500 MG/50ML IV EMUL
INTRAVENOUS | Status: DC | PRN
  Administered 2022-01-05: 150 ug/kg/min via INTRAVENOUS

## 2022-01-05 MED ORDER — LIDOCAINE HCL (CARDIAC) PF 100 MG/5ML IV SOSY
PREFILLED_SYRINGE | INTRAVENOUS | Status: DC | PRN
  Administered 2022-01-05: 100 mg via INTRAVENOUS

## 2022-01-05 MED ORDER — LACTATED RINGERS IV SOLN: INTRAVENOUS | Status: DC

## 2022-01-05 MED ORDER — OMEPRAZOLE 40 MG PO CPDR: 40.0000 mg | DELAYED_RELEASE_CAPSULE | Freq: Every day | ORAL | 3 refills | Status: DC

## 2022-01-05 MED ORDER — PROPOFOL 10 MG/ML IV BOLUS
INTRAVENOUS | Status: DC | PRN
  Administered 2022-01-05: 100 mg via INTRAVENOUS

## 2022-01-05 MED ORDER — PHENYLEPHRINE HCL (PRESSORS) 10 MG/ML IV SOLN
INTRAVENOUS | Status: DC | PRN
  Administered 2022-01-05 (×5): 160 ug via INTRAVENOUS

## 2022-01-05 NOTE — Anesthesia Preprocedure Evaluation (Signed)
Anesthesia Evaluation  Patient identified by MRN, date of birth, ID band Patient awake    Reviewed: Allergy & Precautions, NPO status , Patient's Chart, lab work & pertinent test results, reviewed documented beta blocker date and time   Airway Mallampati: II  TM Distance: >3 FB Neck ROM: Full    Dental  (+) Dental Advisory Given, Caps Crowns :   Pulmonary sleep apnea , former smoker,    Pulmonary exam normal breath sounds clear to auscultation       Cardiovascular Exercise Tolerance: Good hypertension, Pt. on medications and Pt. on home beta blockers + CAD and + Cardiac Stents  Normal cardiovascular exam Rhythm:Regular Rate:Normal     Neuro/Psych negative neurological ROS  negative psych ROS   GI/Hepatic negative GI ROS, Neg liver ROS,   Endo/Other  diabetes, Well Controlled, Type 2, Oral Hypoglycemic Agents, Insulin Dependent  Renal/GU negative Renal ROS  negative genitourinary   Musculoskeletal  (+) Arthritis , Osteoarthritis,    Abdominal   Peds negative pediatric ROS (+)  Hematology negative hematology ROS (+)   Anesthesia Other Findings   Reproductive/Obstetrics negative OB ROS                            Anesthesia Physical Anesthesia Plan  ASA: 3  Anesthesia Plan: General   Post-op Pain Management: Minimal or no pain anticipated   Induction: Intravenous  PONV Risk Score and Plan: Propofol infusion  Airway Management Planned: Nasal Cannula and Natural Airway  Additional Equipment:   Intra-op Plan:   Post-operative Plan:   Informed Consent: I have reviewed the patients History and Physical, chart, labs and discussed the procedure including the risks, benefits and alternatives for the proposed anesthesia with the patient or authorized representative who has indicated his/her understanding and acceptance.     Dental advisory given  Plan Discussed with: CRNA and  Surgeon  Anesthesia Plan Comments:        Anesthesia Quick Evaluation

## 2022-01-05 NOTE — Anesthesia Postprocedure Evaluation (Signed)
Anesthesia Post Note  Patient: Robert [name]  Procedure(s) Performed: COLONOSCOPY WITH PROPOFOL ESOPHAGOGASTRODUODENOSCOPY (EGD) WITH PROPOFOL BIOPSY POLYPECTOMY  Patient location during evaluation: Phase II Anesthesia Type: General Level of consciousness: awake and alert and oriented Pain management: pain level controlled Vital Signs Assessment: post-procedure vital signs reviewed and stable Respiratory status: spontaneous breathing, nonlabored ventilation and respiratory function stable Cardiovascular status: blood pressure returned to baseline and stable Postop Assessment: no apparent nausea or vomiting Anesthetic complications: no   No notable events documented.   Last Vitals:  Vitals:   01/05/22 0702 01/05/22 0826  BP: 131/72 103/62  Pulse: 72 71  Resp: 20 18  Temp: 37.1 C 36.4 C  SpO2: 99% 97%    Last Pain:  Vitals:   01/05/22 0826  TempSrc:   PainSc: 0-No pain                 Trenee Igoe C Sheniya Garciaperez

## 2022-01-05 NOTE — Op Note (Signed)
[facility] Patient Name: Robert [name] Procedure Date: 01/05/2022 7:13 AM MRN: [id number] Date of Birth: [birth year] Attending MD: Maylon Peppers ,  CSN: [id number] Age: 71 Admit Type: Outpatient Procedure:                Colonoscopy Indications:              Surveillance: Personal history of adenomatous                            polyps on last colonoscopy > 5 years ago Providers:                Maylon Peppers, Lambert Mody, Everardo Pacific Referring MD:              Medicines:                Monitored Anesthesia Care Complications:            No immediate complications. Estimated Blood Loss:     Estimated blood loss: none. Procedure:                Pre-Anesthesia Assessment:                           - Prior to the procedure, a History and Physical                            was performed, and patient medications, allergies                            and sensitivities were reviewed. The patient's                            tolerance of previous anesthesia was reviewed.                           - The risks and benefits of the procedure and the                            sedation options and risks were discussed with the                            patient. All questions were answered and informed                            consent was obtained.                           After obtaining informed consent, the colonoscope                            was passed under direct vision. Throughout the                            procedure, the patient's blood pressure, pulse, and  oxygen saturations were monitored continuously. The                            [id number] ([id number]) was introduced through the                            anus and advanced to the the cecum, identified by                            appendiceal orifice and ileocecal valve. The                            colonoscopy was performed without difficulty. The                             patient tolerated the procedure well. The quality                            of the bowel preparation was good. Scope In: 7:57:32 AM Scope Out: 8:21:14 AM Scope Withdrawal Time: 0 hours 15 minutes 8 seconds  Total Procedure Duration: 0 hours 23 minutes 42 seconds  Findings:      The perianal and digital rectal examinations were normal.      Two sessile polyps were found in the sigmoid colon and descending colon.       The polyps were 3 to 4 mm in size. These polyps were removed with a cold       snare. Resection and retrieval were complete.      Scattered large-mouthed diverticula were found in the sigmoid colon and       descending colon.      Non-bleeding internal hemorrhoids were found during retroflexion. The       hemorrhoids were medium-sized. Impression:               - Two 3 to 4 mm polyps in the sigmoid colon and in                            the descending colon, removed with a cold snare.                            Resected and retrieved.                           - Diverticulosis in the sigmoid colon and in the                            descending colon.                           - Non-bleeding internal hemorrhoids. Moderate Sedation:      Per Anesthesia Care Recommendation:           - Discharge patient to home (ambulatory).                           - Resume previous diet.                           -  Await pathology results.                           - Repeat colonoscopy for surveillance based on                            pathology results. Procedure Code(s):        --- Professional ---                           [phone], Colonoscopy, flexible; with removal of                            tumor(s), polyp(s), or other lesion(s) by snare                            technique Diagnosis Code(s):        --- Professional ---                           Z86.010, Personal history of colonic polyps                           K63.5, Polyp of colon                            K64.8, Other hemorrhoids                           K57.30, Diverticulosis of large intestine without                            perforation or abscess without bleeding CPT copyright 2019 American Medical Association. All rights reserved. The codes documented in this report are preliminary and upon coder review may  be revised to meet current compliance requirements. Maylon Peppers, MD Maylon Peppers,  01/05/2022 8:31:23 AM This report has been signed electronically. Number of Addenda: 0

## 2022-01-05 NOTE — Discharge Instructions (Addendum)
You are being discharged to home.  Resume your previous diet.  We are waiting for your pathology results.  Take Prilosec (omeprazole) 40 mg by mouth once a day.  Your physician has recommended a repeat colonoscopy for surveillance based on pathology results.  

## 2022-01-05 NOTE — H&P (Signed)
Robert [name] is an 71 y.o. male.   Chief Complaint: dysphagia and history of colon polyps. HPI: 71 y/o with PMH CAD, DM, HLD, HTN sleep apnea, who comes for evaluation of dysphagia and colonic polyps.  Patient endorsed having intermittent episodes of dysphagia for the last 3 years with last episode 1 month and a half.  He had a esophagram in the past that showed presence of a possible web in his esophagus but he never did an EGD after this.  Last colonoscopy in 2016 Examination performed to cecum. Few small diverticula and sigmoid colon. Small polyp cold snare from sigmoid colon. Internal hemorrhoids.  Pathology consistent with tubular adenoma.  Past Medical History:  Diagnosis Date   Arteriosclerotic cardiovascular disease (ASCVD)    DES to LAD D1 - 2003 in [place]; failed intervention for a totally obstructed RCA at that time; EF of 45%; 12/2009: Equivocal stress nuclear with good exercise tolerance, negative stress EKG, normal EF with mild mid and distal inferior ischemia   Arthritis    Diabetes mellitus, insulin dependent (IDDM), controlled 10/25/2012   HBA1C is 6.9 on 10/22/2012, [employer] 04/26/2020   Diabetes mellitus, type II ([place])    Hyperlipidemia    Lipid profile in 09/2011:128, 130, 33, 69   Hypertension    Lab  09/2011: Normal CMet ex G-133   Sleep apnea    Tobacco abuse, in remission    30 pack years; quit in 1985    Past Surgical History:  Procedure Laterality Date   bone spur Left    foot big toe   CARDIAC CATHETERIZATION     COLONOSCOPY  04/03/2008   Negative screening study   COLONOSCOPY N/A 11/11/2014   Procedure: COLONOSCOPY;  Surgeon: Rogene Houston, MD;  Location: [facility];  Service: Endoscopy;  Laterality: N/A;  1030   EPIDIDYMIS SURGERY  04/03/1994   HERNIA REPAIR     KNEE ARTHROSCOPY W/ MENISCECTOMY  11/01/2008   Right   PRESSURE ULCER DEBRIDEMENT  04/03/2004   Right lower extremity   ROTATOR CUFF REPAIR  07/02/2009   Right    Family  History  Problem Relation Age of Onset   Diabetes Sister    Arthritis Brother    Diabetes Brother    Arthritis Mother    Cancer Father        Hypernephroma   Social History:  reports that he quit smoking about 38 years ago. His smoking use included cigarettes. He started smoking about 55 years ago. He has a 30.00 pack-year smoking history. He has never used smokeless tobacco. He reports that he does not drink alcohol and does not use drugs.  Allergies:  Allergies  Allergen Reactions   Metformin And Related Other (See Comments)    Stomach upset   Oxycodone     Other reaction(s): Not available   Rosuvastatin Other (See Comments)    Adverse GI symptoms   Codeine     "Did not work" and "made me feel foolish"    Medications Prior to Admission  Medication Sig Dispense Refill   acetaminophen (TYLENOL) 500 MG tablet Take 1,000 mg by mouth every 8 (eight) hours as needed for moderate pain or mild pain.     aspirin 81 MG tablet Take 81 mg by mouth daily.     atorvastatin (LIPITOR) 40 MG tablet TAKE 1 TABLET BY MOUTH  DAILY 90 tablet 3   Dulaglutide (TRULICITY) 1.5 UU/7.2ZD SOPN Inject 1.5 mg into the skin once a week. 6 mL 1  insulin glargine (LANTUS SOLOSTAR) 100 UNIT/ML Solostar Pen Inject 35 Units into the skin at bedtime. (Patient taking differently: Inject 40 Units into the skin at bedtime.)     metoprolol succinate (TOPROL-XL) 50 MG 24 hr tablet TAKE 1 TABLET BY MOUTH  DAILY WITH OR IMMEDIATELY  AFTER A MEAL 90 tablet 3   nitroGLYCERIN (NITROSTAT) 0.4 MG SL tablet Place 1 tablet (0.4 mg total) under the tongue every 5 (five) minutes as needed for chest pain. 25 tablet 3   nystatin (MYCOSTATIN/NYSTOP) powder Apply 1 Application topically daily as needed (Apply topically between toes).     olmesartan (BENICAR) 20 MG tablet TAKE 1 TABLET BY MOUTH  DAILY 90 tablet 3   sildenafil (VIAGRA) 100 MG tablet Take 1 tablet  by mouth daily as needed for erectile dysfunction. 10 tablet 3    Continuous Blood Gluc Sensor (FREESTYLE LIBRE 2 SENSOR) MISC Use to monitor blood glucose continuously. Change sensor every 14 days. 2 each 5   Insulin Pen Needle (UNIFINE PENTIPS) 31G X 8 MM MISC Use as directed once daily 100 each 0   polyethylene glycol-electrolytes (TRILYTE) 420 g solution Take 4,000 mLs by mouth as directed. 4000 mL 0    Results for orders placed or performed during the hospital encounter of 01/05/22 (from the past 48 hour(s))  Glucose, capillary     Status: Abnormal   Collection Time: 01/05/22  6:53 AM  Result Value Ref Range   Glucose-Capillary 104 (H) 70 - 99 mg/dL    Comment: Glucose reference range applies only to samples taken after fasting for at least 8 hours.   No results found.  Review of Systems  Constitutional: Negative.   HENT:  Positive for trouble swallowing.   Eyes: Negative.   Respiratory: Negative.    Cardiovascular: Negative.   Gastrointestinal: Negative.   Endocrine: Negative.   Genitourinary: Negative.   Musculoskeletal: Negative.   Skin: Negative.   Allergic/Immunologic: Negative.   Neurological: Negative.   Hematological: Negative.   Psychiatric/Behavioral: Negative.      Blood pressure 131/72, pulse 72, temperature 98.8 F (37.1 C), temperature source Oral, resp. rate 20, height '5\' 9"'$  (1.753 m), weight 92.5 kg, SpO2 99 %. Physical Exam  GENERAL: The patient is AO x3, in no acute distress. HEENT: Head is normocephalic and atraumatic. EOMI are intact. Mouth is well hydrated and without lesions. NECK: Supple. No masses LUNGS: Clear to auscultation. No presence of rhonchi/wheezing/rales. Adequate chest expansion HEART: RRR, normal s1 and s2. ABDOMEN: Soft, nontender, no guarding, no peritoneal signs, and nondistended. BS +. No masses. EXTREMITIES: Without any cyanosis, clubbing, rash, lesions or edema. NEUROLOGIC: AOx3, no focal motor deficit. SKIN: no jaundice, no rashes  Assessment/Plan  71 y/o with PMH CAD, DM, HLD, HTN sleep  apnea, who comes for evaluation of dysphagia and colonic polyps.  We will proceed with EGD and colonoscopy.  Harvel Quale, MD 01/05/2022, 7:29 AM

## 2022-01-05 NOTE — Op Note (Signed)
[facility] Patient Name: Robert [name] Procedure Date: 01/05/2022 7:13 AM MRN: [id number] Date of Birth: 09-Nov-1950 Attending MD: Maylon Peppers ,  CSN: [id number] Age: 71 Admit Type: Outpatient Procedure:                Upper GI endoscopy Indications:              Dysphagia Providers:                Maylon Peppers, Lambert Mody, Everardo Pacific Referring MD:              Medicines:                Monitored Anesthesia Care Complications:            No immediate complications. Estimated Blood Loss:     Estimated blood loss: none. Procedure:                Pre-Anesthesia Assessment:                           - Prior to the procedure, a History and Physical                            was performed, and patient medications, allergies                            and sensitivities were reviewed. The patient's                            tolerance of previous anesthesia was reviewed.                           - The risks and benefits of the procedure and the                            sedation options and risks were discussed with the                            patient. All questions were answered and informed                            consent was obtained.                           After obtaining informed consent, the endoscope was                            passed under direct vision. Throughout the                            procedure, the patient's blood pressure, pulse, and                            oxygen saturations were monitored continuously. The  GIF-H190 ([id number]) scope was introduced through the                            mouth, and advanced to the second part of duodenum.                            The upper GI endoscopy was accomplished without                            difficulty. The patient tolerated the procedure                            well. Scope In: 7:38:06 AM Scope Out: 7:50:21 AM Total  Procedure Duration: 0 hours 12 minutes 15 seconds  Findings:      A 1 cm hiatal hernia was present.      The gastroesophageal flap valve was visualized endoscopically and       classified as Hill Grade III (minimal fold, loose to endoscope, hiatal       hernia likely).      No endoscopic abnormality was evident in the esophagus to explain the       patient's complaint of dysphagia. It was decided, however, to proceed       with dilation of the entire esophagus. A guidewire was placed and the       scope was withdrawn. Dilation was performed with a Savary dilator with       no resistance at 18 mm. Mucosal disruption was seen upon reinspection of       the lower esophagus.      A single 4 mm mucosal papule (nodule) with no bleeding and no stigmata       of recent bleeding was found in the gastric fundus. The polyp was       removed with a cold snare. Resection and retrieval were complete.      The examined duodenum was normal. Impression:               - 1 cm hiatal hernia.                           - Gastroesophageal flap valve classified as Hill                            Grade III (minimal fold, loose to endoscope, hiatal                            hernia likely).                           - No endoscopic esophageal abnormality to explain                            patient's dysphagia. Esophagus dilated. Dilated.                           - A single mucosal papule (nodule) found in the  stomach. Complete removal was accomplished.                           - Normal examined duodenum. Moderate Sedation:      Per Anesthesia Care Recommendation:           - Discharge patient to home (ambulatory).                           - Resume previous diet.                           - Await pathology results.                           - Use Prilosec (omeprazole) 40 mg PO daily. Procedure Code(s):        --- Professional ---                           [phone],  Esophagogastroduodenoscopy, flexible,                            transoral; with removal of tumor(s), polyp(s), or                            other lesion(s) by snare technique                           43248, Esophagogastroduodenoscopy, flexible,                            transoral; with insertion of guide wire followed by                            passage of dilator(s) through esophagus over guide                            wire Diagnosis Code(s):        --- Professional ---                           K44.9, Diaphragmatic hernia without obstruction or                            gangrene                           R13.10, Dysphagia, unspecified                           K31.89, Other diseases of stomach and duodenum CPT copyright 2019 [employer]. All rights reserved. The codes documented in this report are preliminary and upon coder review may  be revised to meet current compliance requirements. Maylon Peppers, MD Maylon Peppers,  01/05/2022 7:56:32 AM This report has been signed electronically. Number of Addenda: 0

## 2022-01-05 NOTE — Transfer of Care (Signed)
Immediate Anesthesia Transfer of Care Note  Patient: Robert [name]  Procedure(s) Performed: COLONOSCOPY WITH PROPOFOL ESOPHAGOGASTRODUODENOSCOPY (EGD) WITH PROPOFOL BIOPSY POLYPECTOMY  Patient Location: PACU  Anesthesia Type:General  Level of Consciousness: awake, drowsy and patient cooperative  Airway & Oxygen Therapy: Patient Spontanous Breathing  Post-op Assessment: Report given to RN, Post -op Vital signs reviewed and stable and Patient moving all extremities X 4  Post vital signs: Reviewed and stable  Last Vitals:  Vitals Value Taken Time  BP 103/62 01/05/22 0826  Temp 36.4 C 01/05/22 0826  Pulse 71 01/05/22 0826  Resp 18 01/05/22 0826  SpO2 97 % 01/05/22 0826    Last Pain:  Vitals:   01/05/22 0826  TempSrc:   PainSc: 0-No pain      Patients Stated Pain Goal: 6 (00/45/99 7741)  Complications: No notable events documented.

## 2022-01-06 LAB — SURGICAL PATHOLOGY

## 2022-01-07 MED FILL — Olmesartan Medoxomil Tab 20 MG: ORAL | 90 days supply | Qty: 90 | Fill #2 | Status: AC

## 2022-01-07 MED FILL — Atorvastatin Calcium Tab 40 MG (Base Equivalent): ORAL | 90 days supply | Qty: 90 | Fill #2 | Status: AC

## 2022-01-09 ENCOUNTER — Other Ambulatory Visit (HOSPITAL_COMMUNITY): Payer: Self-pay

## 2022-01-11 ENCOUNTER — Encounter (HOSPITAL_COMMUNITY): Payer: Self-pay | Admitting: Gastroenterology

## 2022-02-01 ENCOUNTER — Other Ambulatory Visit: Payer: Self-pay | Admitting: Family Medicine

## 2022-02-01 DIAGNOSIS — E1169 Type 2 diabetes mellitus with other specified complication: Secondary | ICD-10-CM

## 2022-02-01 MED ORDER — TECHLITE PEN NEEDLES 31G X 8 MM MISC
0 refills | Status: DC
  Filled 2022-02-01: qty 100, 90d supply, fill #0

## 2022-02-01 MED ORDER — LANTUS SOLOSTAR 100 UNIT/ML ~~LOC~~ SOPN: 35.0000 [IU] | PEN_INJECTOR | Freq: Every day | SUBCUTANEOUS | Status: DC

## 2022-02-02 ENCOUNTER — Other Ambulatory Visit (HOSPITAL_COMMUNITY): Payer: Self-pay

## 2022-02-17 ENCOUNTER — Ambulatory Visit (INDEPENDENT_AMBULATORY_CARE_PROVIDER_SITE_OTHER): Payer: HMO | Admitting: Family Medicine

## 2022-02-17 ENCOUNTER — Other Ambulatory Visit (HOSPITAL_COMMUNITY): Payer: Self-pay

## 2022-02-17 ENCOUNTER — Encounter: Payer: Self-pay | Admitting: Family Medicine

## 2022-02-17 VITALS — BP 129/74 | HR 82 | Ht 69.0 in | Wt 207.0 lb

## 2022-02-17 DIAGNOSIS — R972 Elevated prostate specific antigen [PSA]: Secondary | ICD-10-CM

## 2022-02-17 DIAGNOSIS — Z794 Long term (current) use of insulin: Secondary | ICD-10-CM

## 2022-02-17 DIAGNOSIS — I10 Essential (primary) hypertension: Secondary | ICD-10-CM

## 2022-02-17 DIAGNOSIS — Z0001 Encounter for general adult medical examination with abnormal findings: Secondary | ICD-10-CM | POA: Diagnosis not present

## 2022-02-17 DIAGNOSIS — Z125 Encounter for screening for malignant neoplasm of prostate: Secondary | ICD-10-CM

## 2022-02-17 DIAGNOSIS — I2510 Atherosclerotic heart disease of native coronary artery without angina pectoris: Secondary | ICD-10-CM | POA: Diagnosis not present

## 2022-02-17 DIAGNOSIS — E1169 Type 2 diabetes mellitus with other specified complication: Secondary | ICD-10-CM | POA: Diagnosis not present

## 2022-02-17 DIAGNOSIS — Z01810 Encounter for preprocedural cardiovascular examination: Secondary | ICD-10-CM

## 2022-02-17 DIAGNOSIS — E559 Vitamin D deficiency, unspecified: Secondary | ICD-10-CM

## 2022-02-17 MED ORDER — LANTUS SOLOSTAR 100 UNIT/ML ~~LOC~~ SOPN
40.0000 [IU] | PEN_INJECTOR | Freq: Every day | SUBCUTANEOUS | 3 refills | Status: DC
  Filled 2022-02-17: qty 45, 112d supply, fill #0
  Filled 2022-06-19: qty 45, 112d supply, fill #1
  Filled 2022-10-03: qty 45, 112d supply, fill #2
  Filled 2023-01-25: qty 45, 112d supply, fill #3

## 2022-02-17 MED ORDER — TRULICITY 1.5 MG/0.5ML ~~LOC~~ SOAJ
1.5000 mg | SUBCUTANEOUS | 3 refills | Status: DC
  Filled 2022-02-17 – 2022-03-03 (×2): qty 6, 84d supply, fill #0
  Filled 2022-06-06 (×2): qty 6, 84d supply, fill #1
  Filled 2022-09-04: qty 2, 28d supply, fill #2
  Filled 2022-10-03 – 2022-10-12 (×3): qty 2, 28d supply, fill #3
  Filled 2022-11-03: qty 2, 28d supply, fill #4
  Filled 2022-12-05: qty 2, 28d supply, fill #5
  Filled 2023-01-03: qty 2, 28d supply, fill #6
  Filled 2023-01-27 – 2023-02-05 (×2): qty 2, 28d supply, fill #7

## 2022-02-17 NOTE — Patient Instructions (Signed)
F/U in 5 months, call if you need me sooner  Fasting CBc,lipid , cmp and EGFr, HBA1C, TSH, PSA, and vit D first week in Jan  You are referred to Cardiology  Lantus, and Trulicity are refilled  Covid, flu and RSV vaccine record will be obtained from [employer] for your record  Best with right knee replacement  .Thanks for choosing [facility], we consider it a privelige to serve you.

## 2022-02-19 ENCOUNTER — Encounter: Payer: Self-pay | Admitting: Family Medicine

## 2022-02-19 DIAGNOSIS — Z0000 Encounter for general adult medical examination without abnormal findings: Secondary | ICD-10-CM | POA: Insufficient documentation

## 2022-02-19 DIAGNOSIS — Z0001 Encounter for general adult medical examination with abnormal findings: Secondary | ICD-10-CM | POA: Insufficient documentation

## 2022-02-19 LAB — MICROALBUMIN / CREATININE URINE RATIO
Creatinine, Urine: 126.8 mg/dL
Microalb/Creat Ratio: 33 mg/g creat — ABNORMAL HIGH (ref 0–29)
Microalbumin, Urine: 42.2 ug/mL

## 2022-02-19 NOTE — Assessment & Plan Note (Signed)
Annual exam as documented. . Immunization and cancer screening needs are specifically addressed at this visit.  

## 2022-02-19 NOTE — Progress Notes (Signed)
   Robert [name]     MRN: [id number]      DOB: 1950/09/11   HPI: Patient is in for annual physical exam. Has right knee replacement scheduled for feb, 2024, will refer for Cardiology clearance Recent labs, if available are reviewed. Immunization is reviewed , and  updated if needed.    PE; BP 129/74 (BP Location: Right Arm, Patient Position: Sitting, Cuff Size: Large)   Pulse 82   Ht '5\' 9"'$  (1.753 m)   Wt 207 lb (93.9 kg)   SpO2 94%   BMI 30.57 kg/m   Pleasant male, alert and oriented x 3, in no cardio-pulmonary distress. Afebrile. HEENT No facial trauma or asymetry. Sinuses non tender. EOMI External ears normal, TM clear bilaterally Neck: supple, no adenopathy,JVD or thyromegaly.No bruits.  Chest: Clear to ascultation bilaterally.No crackles or wheezes. Non tender to palpation  Cardiovascular system; Heart sounds normal,  S1 and  S2 ,no S3.  No murmur, or thrill. Apical beat not displaced Peripheral pulses normal.    Musculoskeletal exam: Full ROM of spine, hips , shoulders and knees. Deformity and crepitus of right knee No muscle wasting or atrophy.   Neurologic: Cranial nerves 2 to 12 intact. Power, tone ,sensation  normal throughout. Minor  disturbance in gait. No tremor.  Skin: Intact, no ulceration, erythema , scaling or rash noted. Pigmentation normal throughout  Psych; Normal mood and affect. Judgement and concentration normal   Assessment & Plan:  Encounter for Medicare annual examination with abnormal findings Annual exam as documented.  Immunization and cancer screening needs are specifically addressed at this visit.   ASCVD (arteriosclerotic cardiovascular disease) Referred for cardiology eval and preop clearance , has planned right knee replacement in Feb, 2024

## 2022-02-19 NOTE — Assessment & Plan Note (Signed)
Referred for cardiology eval and preop clearance , has planned right knee replacement in Feb, 2024

## 2022-02-20 ENCOUNTER — Telehealth: Payer: Self-pay | Admitting: Family Medicine

## 2022-02-20 ENCOUNTER — Other Ambulatory Visit (HOSPITAL_COMMUNITY): Payer: Self-pay

## 2022-02-20 NOTE — Telephone Encounter (Signed)
Spoke with the pt he is using OTC meds only, no fever or shortness of breath

## 2022-02-20 NOTE — Telephone Encounter (Signed)
Pt was seen Friday & Friday night he had chills & sore throat & runny nose & tested + covid Friday night. PT just wanted to give you an FYI.

## 2022-02-20 NOTE — Telephone Encounter (Signed)
FYI

## 2022-02-28 ENCOUNTER — Telehealth: Payer: Self-pay | Admitting: Family Medicine

## 2022-02-28 NOTE — Telephone Encounter (Signed)
Received medical clearance forms from Dr Gaynelle Arabian, MD to have right total knee arthroscopy on 02.26.2024. forms in provider box.   Copied Noted Sleeved

## 2022-03-03 ENCOUNTER — Other Ambulatory Visit (HOSPITAL_COMMUNITY): Payer: Self-pay

## 2022-03-03 ENCOUNTER — Other Ambulatory Visit: Payer: Self-pay | Admitting: Family Medicine

## 2022-03-03 MED ORDER — METOPROLOL SUCCINATE ER 50 MG PO TB24
ORAL_TABLET | ORAL | 3 refills | Status: DC
  Filled 2022-03-03: qty 90, 90d supply, fill #0
  Filled 2022-04-08: qty 90, 90d supply, fill #1

## 2022-03-30 ENCOUNTER — Other Ambulatory Visit (HOSPITAL_COMMUNITY): Payer: Self-pay

## 2022-04-04 DIAGNOSIS — Z125 Encounter for screening for malignant neoplasm of prostate: Secondary | ICD-10-CM | POA: Diagnosis not present

## 2022-04-04 DIAGNOSIS — E559 Vitamin D deficiency, unspecified: Secondary | ICD-10-CM | POA: Diagnosis not present

## 2022-04-04 DIAGNOSIS — I10 Essential (primary) hypertension: Secondary | ICD-10-CM | POA: Diagnosis not present

## 2022-04-04 DIAGNOSIS — Z794 Long term (current) use of insulin: Secondary | ICD-10-CM | POA: Diagnosis not present

## 2022-04-04 DIAGNOSIS — E1169 Type 2 diabetes mellitus with other specified complication: Secondary | ICD-10-CM | POA: Diagnosis not present

## 2022-04-05 ENCOUNTER — Other Ambulatory Visit: Payer: Self-pay

## 2022-04-05 ENCOUNTER — Other Ambulatory Visit (HOSPITAL_COMMUNITY): Payer: Self-pay

## 2022-04-05 LAB — CMP14+EGFR
ALT: 22 IU/L (ref 0–44)
AST: 18 IU/L (ref 0–40)
Albumin/Globulin Ratio: 1.5 (ref 1.2–2.2)
Albumin: 4.3 g/dL (ref 3.9–4.9)
Alkaline Phosphatase: 126 IU/L — ABNORMAL HIGH (ref 44–121)
BUN/Creatinine Ratio: 11 (ref 10–24)
BUN: 14 mg/dL (ref 8–27)
Bilirubin Total: 0.4 mg/dL (ref 0.0–1.2)
CO2: 22 mmol/L (ref 20–29)
Calcium: 9.5 mg/dL (ref 8.6–10.2)
Chloride: 102 mmol/L (ref 96–106)
Creatinine, Ser: 1.22 mg/dL (ref 0.76–1.27)
Globulin, Total: 2.8 g/dL (ref 1.5–4.5)
Glucose: 122 mg/dL — ABNORMAL HIGH (ref 70–99)
Potassium: 4.6 mmol/L (ref 3.5–5.2)
Sodium: 140 mmol/L (ref 134–144)
Total Protein: 7.1 g/dL (ref 6.0–8.5)
eGFR: 64 mL/min/{1.73_m2} (ref >59)

## 2022-04-05 LAB — CBC WITH DIFFERENTIAL/PLATELET
Basophils Absolute: 0 10*3/uL (ref 0.0–0.2)
Basos: 1 %
EOS (ABSOLUTE): 0.3 10*3/uL (ref 0.0–0.4)
Eos: 5 %
Hematocrit: 43.3 % (ref 37.5–51.0)
Hemoglobin: 14.1 g/dL (ref 13.0–17.7)
Immature Grans (Abs): 0 10*3/uL (ref 0.0–0.1)
Immature Granulocytes: 0 %
Lymphocytes Absolute: 1.8 10*3/uL (ref 0.7–3.1)
Lymphs: 33 %
MCH: 29.1 pg (ref 26.6–33.0)
MCHC: 32.6 g/dL (ref 31.5–35.7)
MCV: 90 fL (ref 79–97)
Monocytes Absolute: 0.5 10*3/uL (ref 0.1–0.9)
Monocytes: 9 %
Neutrophils Absolute: 3 10*3/uL (ref 1.4–7.0)
Neutrophils: 52 %
Platelets: 244 10*3/uL (ref 150–450)
RBC: 4.84 x10E6/uL (ref 4.14–5.80)
RDW: 13.4 % (ref 11.6–15.4)
WBC: 5.6 10*3/uL (ref 3.4–10.8)

## 2022-04-05 LAB — VITAMIN D 25 HYDROXY (VIT D DEFICIENCY, FRACTURES): Vit D, 25-Hydroxy: 14.5 ng/mL — ABNORMAL LOW (ref 30.0–100.0)

## 2022-04-05 LAB — PSA: Prostate Specific Ag, Serum: 4.3 ng/mL — ABNORMAL HIGH (ref 0.0–4.0)

## 2022-04-05 LAB — HEMOGLOBIN A1C
Est. average glucose Bld gHb Est-mCnc: 154 mg/dL
Hgb A1c MFr Bld: 7 % — ABNORMAL HIGH (ref 4.8–5.6)

## 2022-04-05 LAB — TSH: TSH: 2.05 u[IU]/mL (ref 0.450–4.500)

## 2022-04-05 MED ORDER — VITAMIN D (ERGOCALCIFEROL) 1.25 MG (50000 UNIT) PO CAPS
50000.0000 [IU] | ORAL_CAPSULE | ORAL | 8 refills | Status: DC
  Filled 2022-04-05: qty 5, 35d supply, fill #0
  Filled 2022-05-10: qty 5, 35d supply, fill #1
  Filled 2022-06-19: qty 5, 35d supply, fill #2
  Filled 2022-08-05: qty 5, 35d supply, fill #3
  Filled 2022-09-04: qty 5, 35d supply, fill #4
  Filled 2022-10-03: qty 5, 35d supply, fill #5

## 2022-04-05 NOTE — Addendum Note (Signed)
Addended by: Fayrene Helper on: 04/05/2022 07:15 AM   Modules accepted: Orders

## 2022-04-05 NOTE — Addendum Note (Signed)
Addended by: Smitty Knudsen on: 04/05/2022 08:33 AM   Modules accepted: Orders

## 2022-04-08 ENCOUNTER — Other Ambulatory Visit: Payer: Self-pay | Admitting: Family Medicine

## 2022-04-08 ENCOUNTER — Other Ambulatory Visit: Payer: Self-pay | Admitting: Student

## 2022-04-10 ENCOUNTER — Other Ambulatory Visit: Payer: Self-pay

## 2022-04-10 ENCOUNTER — Other Ambulatory Visit (HOSPITAL_COMMUNITY): Payer: Self-pay

## 2022-04-10 DIAGNOSIS — M1711 Unilateral primary osteoarthritis, right knee: Secondary | ICD-10-CM | POA: Diagnosis not present

## 2022-04-10 MED ORDER — LORATADINE 10 MG PO TABS
10.0000 mg | ORAL_TABLET | Freq: Every day | ORAL | 1 refills | Status: AC
  Filled 2022-04-10 – 2022-05-10 (×2): qty 30, 30d supply, fill #0

## 2022-04-10 MED ORDER — ATORVASTATIN CALCIUM 40 MG PO TABS
40.0000 mg | ORAL_TABLET | Freq: Every day | ORAL | 3 refills | Status: DC
  Filled 2022-04-10: qty 90, 90d supply, fill #0
  Filled 2022-05-10 – 2022-07-04 (×2): qty 90, 90d supply, fill #1
  Filled 2022-10-03: qty 90, 90d supply, fill #2
  Filled 2022-11-03 – 2022-12-31 (×2): qty 90, 90d supply, fill #3

## 2022-04-10 MED ORDER — OLMESARTAN MEDOXOMIL 20 MG PO TABS
20.0000 mg | ORAL_TABLET | Freq: Every day | ORAL | 3 refills | Status: DC
  Filled 2022-04-10: qty 90, 90d supply, fill #0
  Filled 2022-05-10 – 2022-07-04 (×2): qty 90, 90d supply, fill #1
  Filled 2022-10-03: qty 90, 90d supply, fill #2
  Filled 2022-11-03 – 2022-12-31 (×2): qty 90, 90d supply, fill #3

## 2022-04-10 NOTE — H&P (Signed)
TOTAL KNEE ADMISSION H&P  Patient is being admitted for right total knee arthroplasty.  Subjective:  Chief Complaint: Right knee pain.  HPI: Robert [name], 72 y.o. male has a history of pain and functional disability in the right knee due to arthritis and has failed non-surgical conservative treatments for greater than 12 weeks to include NSAID's and/or analgesics, corticosteriod injections, and activity modification. Onset of symptoms was gradual, starting several years ago with gradually worsening course since that time. The patient noted prior procedures on the knee to include  arthroscopy and menisectomy on the right knee.  Patient currently rates pain in the right knee at 7 out of 10 with activity. Patient has night pain, worsening of pain with activity and weight bearing, and pain that interferes with activities of daily living. Patient has evidence of  bone-on-bone arthritis in the medial compartment with significant patellofemoral narrowing  by imaging studies. There is no active infection.  Patient Active Problem List   Diagnosis Date Noted   Encounter for Medicare annual examination with abnormal findings 02/19/2022   Osteoarthritis of right knee 11/15/2021   Light headedness 02/12/2021   Asteatotic eczema 12/16/2020   Rash 05/04/2020   Knee pain, right 11/05/2018   Type 2 diabetes mellitus with vascular disease ([place]) 03/05/2018   Dysphagia 03/05/2018   Hypersomnia with sleep apnea 01/16/2016   Seasonal allergies 01/04/2015   Tubular adenoma of colon 11/13/2014   ASCVD (arteriosclerotic cardiovascular disease) 08/21/2014   Bilateral hand pain 04/06/2014   Back pain 09/16/2013   Essential hypertension    Hyperlipidemia LDL goal <70    Obesity (BMI 30.0-34.9) 12/24/2011   Cardiovascular disease 09/27/2009   TOBACCO ABUSE, HX OF 09/27/2009    Past Medical History:  Diagnosis Date   Arteriosclerotic cardiovascular disease (ASCVD)    DES to LAD D1 - 2003 in [place]; failed  intervention for a totally obstructed RCA at that time; EF of 45%; 12/2009: Equivocal stress nuclear with good exercise tolerance, negative stress EKG, normal EF with mild mid and distal inferior ischemia   Arthritis    Diabetes mellitus, insulin dependent (IDDM), controlled 10/25/2012   HBA1C is 6.9 on 10/22/2012, Phreesia 04/26/2020   Diabetes mellitus, type II ([place])    Hyperlipidemia    Lipid profile in 09/2011:128, 130, 33, 69   Hypertension    Lab  09/2011: Normal CMet ex G-133   Sleep apnea    Tobacco abuse, in remission    30 pack years; quit in 1985    Past Surgical History:  Procedure Laterality Date   BIOPSY  01/05/2022   Procedure: BIOPSY;  Surgeon: Montez Morita, Quillian Quince, MD;  [facility];  Service: Gastroenterology;;   bone spur Left    foot big toe   CARDIAC CATHETERIZATION     COLONOSCOPY  04/03/2008   Negative screening study   COLONOSCOPY N/A 11/11/2014   Procedure: COLONOSCOPY;  Surgeon: Rogene Houston, MD;  [facility];  Service: Endoscopy;  Laterality: N/A;  1030   COLONOSCOPY WITH PROPOFOL N/A 01/05/2022   Procedure: COLONOSCOPY WITH PROPOFOL;  Surgeon: Harvel Quale, MD;  [facility];  Service: Gastroenterology;  Laterality: N/A;  730 ASA 2   EPIDIDYMIS SURGERY  04/03/1994   ESOPHAGOGASTRODUODENOSCOPY (EGD) WITH PROPOFOL N/A 01/05/2022   Procedure: ESOPHAGOGASTRODUODENOSCOPY (EGD) WITH PROPOFOL;  Surgeon: Harvel Quale, MD;  [facility];  Service: Gastroenterology;  Laterality: N/A;   HERNIA REPAIR     KNEE ARTHROSCOPY W/ MENISCECTOMY  11/01/2008   Right   POLYPECTOMY  01/05/2022   Procedure: POLYPECTOMY;  Surgeon: Harvel Quale, MD;  [facility];  Service: Gastroenterology;;   PRESSURE ULCER DEBRIDEMENT  04/03/2004   Right lower extremity   ROTATOR CUFF REPAIR  07/02/2009   Right    Prior to Admission medications   Medication Sig Start Date End Date Taking?  Authorizing Provider  acetaminophen (TYLENOL) 500 MG tablet Take 1,000 mg by mouth every 8 (eight) hours as needed for moderate pain or mild pain.    [provider]  aspirin 81 MG tablet Take 81 mg by mouth daily.    [provider]  atorvastatin (LIPITOR) 40 MG tablet Take 1 tablet (40 mg total) by mouth daily. 04/10/22   Arnoldo Lenis, MD  Continuous Blood Gluc Sensor (FREESTYLE LIBRE 2 SENSOR) MISC Use to monitor blood glucose continuously. Change sensor every 14 days. 11/15/21   Fayrene Helper, MD  Dulaglutide (TRULICITY) 1.5 OI/7.1IW SOPN Inject 1.5 mg into the skin once a week. 02/17/22   Fayrene Helper, MD  insulin glargine (LANTUS SOLOSTAR) 100 UNIT/ML Solostar Pen Inject 40 Units into the skin at bedtime. 02/17/22   Fayrene Helper, MD  Insulin Pen Needle (TECHLITE PEN NEEDLES) 31G X 8 MM MISC Use as directed once daily 02/01/22   Fayrene Helper, MD  loratadine (CLARITIN) 10 MG tablet Take 1 tablet (10 mg total) by mouth daily. 04/10/22   Fayrene Helper, MD  metoprolol succinate (TOPROL-XL) 50 MG 24 hr tablet TAKE 1 TABLET BY MOUTH  DAILY WITH OR IMMEDIATELY  AFTER A MEAL 05/23/21   Fayrene Helper, MD  metoprolol succinate (TOPROL-XL) 50 MG 24 hr tablet TAKE 1 TABLET BY MOUTH  DAILY WITH OR IMMEDIATELY  AFTER A MEAL 03/03/22   Fayrene Helper, MD  nitroGLYCERIN (NITROSTAT) 0.4 MG SL tablet Place 1 tablet (0.4 mg total) under the tongue every 5 (five) minutes as needed for chest pain. 02/12/19   Strader, Fransisco Hertz, PA-C  nystatin (MYCOSTATIN/NYSTOP) powder Apply 1 Application topically daily as needed (Apply topically between toes).    [provider]  olmesartan (BENICAR) 20 MG tablet Take 1 tablet (20 mg total) by mouth daily. 04/10/22   Arnoldo Lenis, MD  omeprazole (PRILOSEC) 40 MG capsule Take 1 capsule (40 mg total) by mouth daily. 01/05/22   Harvel Quale, MD  Vitamin D, Ergocalciferol, (DRISDOL) 1.25 MG (50000  UNIT) CAPS capsule Take 1 capsule (50,000 Units total) by mouth every 7 (seven) days. 04/05/22   Fayrene Helper, MD    Allergies  Allergen Reactions   Metformin And Related Other (See Comments)    Stomach upset   Oxycodone     Other reaction(s): Not available   Rosuvastatin Other (See Comments)    Adverse GI symptoms   Codeine     "Did not work" and "made me feel foolish"    Social History   Socioeconomic History   Marital status: Married    Spouse name: Not on file   Number of children: 2   Years of education: Not on file   Highest education level: Some college, no degree  Occupational History   Occupation: Retired  Tobacco Use   Smoking status: Former    Packs/day: 1.00    Years: 30.00    Total pack years: 30.00    Types: Cigarettes    Start date: 04/03/1966    Quit date: 09/02/1983    Years  since quitting: 38.6   Smokeless tobacco: Never  Vaping Use   Vaping Use: Never used  Substance and Sexual Activity   Alcohol use: No    Alcohol/week: 0.0 standard drinks of alcohol    Comment: Former Tax inspector -discontinued use in 2005   Drug use: No   Sexual activity: Yes  Other Topics Concern   Not on file  Social History Narrative   Not on file   Social Determinants of Health   Financial Resource Strain: Low Risk  (12/03/2020)   Overall Financial Resource Strain (CARDIA)    Difficulty of Paying Living Expenses: Not hard at all  Food Insecurity: No Food Insecurity (12/03/2020)   Hunger Vital Sign    Worried About Running Out of Food in the Last Year: Never true    [place] in the Last Year: Never true  Transportation Needs: No Transportation Needs (12/03/2020)   PRAPARE - Hydrologist (Medical): No    Lack of Transportation (Non-Medical): No  Physical Activity: Inactive (12/03/2020)   Exercise Vital Sign    Days of Exercise per Week: 0 days    Minutes of Exercise per Session: 0 min  Stress: No Stress Concern Present (12/03/2020)    [place]    Feeling of Stress : Not at all  Social Connections: Unknown (12/03/2020)   Social Connection and Isolation Panel [NHANES]    Frequency of Communication with Friends and Family: Not on file    Frequency of Social Gatherings with Friends and Family: Twice a week    Attends Religious Services: More than 4 times per year    Active Member of [employer] or Organizations: Yes    Attends Music therapist: More than 4 times per year    Marital Status: Married  Human resources officer Violence: Not At Risk (12/03/2020)   Humiliation, Afraid, Rape, and Kick questionnaire    Fear of Current or Ex-Partner: No    Emotionally Abused: No    Physically Abused: No    Sexually Abused: No    Tobacco Use: Medium Risk (02/19/2022)   Patient History    Smoking Tobacco Use: Former    Smokeless Tobacco Use: Never    Passive Exposure: Not on file   Social History   Substance and Sexual Activity  Alcohol Use No   Alcohol/week: 0.0 standard drinks of alcohol   Comment: Former Tax inspector -discontinued use in 2005    Family History  Problem Relation Age of Onset   Diabetes Sister    Arthritis Brother    Diabetes Brother    Arthritis Mother    Cancer Father        Hypernephroma    Review of Systems  Constitutional:  Negative for chills and fever.  HENT: Negative.    Eyes: Negative.   Respiratory:  Negative for cough and shortness of breath.   Cardiovascular:  Negative for chest pain and palpitations.  Gastrointestinal:  Positive for constipation. Negative for abdominal pain, nausea and vomiting.  Genitourinary:  Negative for dysuria, frequency and urgency.  Musculoskeletal:  Positive for joint pain.  Skin:  Negative for rash.   Objective:  Physical Exam: Well nourished and well developed.  General: Alert and oriented x3, cooperative and pleasant, no acute distress.  Head: normocephalic, atraumatic, neck supple.   Eyes: EOMI. Abdomen: non-tender to palpation and soft, normoactive bowel sounds. Musculoskeletal:  Right Knee Exam: No effusion present. No swelling present. The  range of motion is: 0 to 125 degrees. Positive crepitus on range of motion of the knee. Positive medial joint line tenderness. No lateral joint line tenderness. The knee is stable.  Calves soft and nontender. Motor function intact in LE. Strength 5/5 LE bilaterally. Neuro: Distal pulses 2+. Sensation to light touch intact in LE.  Vital signs in last 24 hours: BP: ()/()  Arterial Line BP: ()/()   Imaging Review Plain radiographs demonstrate severe degenerative joint disease of the right knee. The overall alignment is neutral. The bone quality appears to be adequate for age and reported activity level.  Assessment/Plan:  End stage arthritis, right knee   The patient history, physical examination, clinical judgment of the provider and imaging studies are consistent with end stage degenerative joint disease of the right knee and total knee arthroplasty is deemed medically necessary. The treatment options including medical management, injection therapy arthroscopy and arthroplasty were discussed at length. The risks and benefits of total knee arthroplasty were presented and reviewed. The risks due to aseptic loosening, infection, stiffness, patella tracking problems, thromboembolic complications and other imponderables were discussed. The patient acknowledged the explanation, agreed to proceed with the plan and consent was signed. Patient is being admitted for inpatient treatment for surgery, pain control, PT, OT, prophylactic antibiotics, VTE prophylaxis, progressive ambulation and ADLs and discharge planning. The patient is planning to be discharged  home .  Patient's anticipated LOS is less than 2 midnights, meeting these requirements: - Lives within 1 hour of care - Has a competent adult at home to recover with post-op - NO  history of  - Chronic pain requiring opiods  - Coronary Artery Disease  - Heart failure  - Stroke  - DVT/VTE  - Cardiac arrhythmia  - Respiratory Failure/COPD  - Renal failure  - Anemia  - Advanced Liver disease  Therapy Plans: EO [place] Disposition: Home with Wife Planned DVT Prophylaxis: Aspirin 325 mg BID (hx of stents in heart 2003) DME Needed: RW PCP: Tula Nakayama, MD (requesting cardiac clearance) Cardiology: Carlyle Dolly, NP (appt 1/16) TXA: IV Allergies: oxycodone (hallucinations) Anesthesia Concerns: None - prescribed CPAP but does not use BMI: 29.9 Last HgbA1c: 7.0  [facility]: [facility] (deliver to room)  Other: -rash on the right inner thigh from cold - has seen dermatology for this -has blood glucose monitor on right arm -discussed dilaudid post-op  - Patient was instructed on what medications to stop prior to surgery. - Follow-up visit in 2 weeks with Dr. Wynelle Link - Begin physical therapy following surgery - Pre-operative lab work as pre-surgical testing - Prescriptions will be provided in hospital at time of discharge  R. Jaynie Bream, PA-C Orthopedic Surgery [facility] [place]

## 2022-04-17 NOTE — Progress Notes (Unsigned)
Office Visit    Patient Name: MERVIN RAMIRES Date of Encounter: 04/18/2022  PCP:  Fayrene Helper, MD   [place]  Cardiologist:  Carlyle Dolly, MD  Advanced Practice Provider:  No care team member to display Electrophysiologist:  None   HPI    ERNAN RUNKLES is a 72 y.o. male with past medical history significant for abnormal stress test.  (Cath with DES LAD-D1 and CTO RCA 2003 ([place])), low risk MD 2016, DM, HTN, HLD presents today for follow-up appointment and preop evaluation.  He was last seen 06/03/2021 and at that time had started exercising by walking 3 days a week.  He never had any chest pain with walking but sometimes gets short of breath until he would get his second winded then he was okay.  No lower extremity edema, orthopnea, or PND.  He describes nocturia 3-4 times a night he had not talked to his PCP about this  Combination of Dexcom and Trulicity is helping his blood sugars remain well-controlled.  He had lost 10 pounds.  Today, he presents for preop clearance. Overall he is doing well without chest pain or SOB. He does have "gas" at times in the center of his chest but its non-exertional and seemed to be more GI related. It is not linked to any other symptoms. He remains active despite his knee needing replaced. He has scored a 6.36 on the DASI which exceeds the 4 mets minimum requirement.  He is okay to hold ASA 5 days. He can restart when medically safe to do so.  Reports no shortness of breath nor dyspnea on exertion. Reports no chest pain, pressure, or tightness. No edema, orthopnea, PND. Reports no palpitations.    Past Medical History    Past Medical History:  Diagnosis Date   Arteriosclerotic cardiovascular disease (ASCVD)    DES to LAD D1 - 2003 in [place]; failed intervention for a totally obstructed RCA at that time; EF of 45%; 12/2009: Equivocal stress nuclear with good exercise tolerance, negative stress EKG, normal EF  with mild mid and distal inferior ischemia   Arthritis    Diabetes mellitus, insulin dependent (IDDM), controlled 10/25/2012   HBA1C is 6.9 on 10/22/2012, Phreesia 04/26/2020   Diabetes mellitus, type II ([place])    Hyperlipidemia    Lipid profile in 09/2011:128, 130, 33, 69   Hypertension    Lab  09/2011: Normal CMet ex G-133   Sleep apnea    Tobacco abuse, in remission    30 pack years; quit in 1985   Past Surgical History:  Procedure Laterality Date   BIOPSY  01/05/2022   Procedure: BIOPSY;  Surgeon: Montez Morita, Quillian Quince, MD;  [facility];  Service: Gastroenterology;;   bone spur Left    foot big toe   CARDIAC CATHETERIZATION     COLONOSCOPY  04/03/2008   Negative screening study   COLONOSCOPY N/A 11/11/2014   Procedure: COLONOSCOPY;  Surgeon: Rogene Houston, MD;  [facility];  Service: Endoscopy;  Laterality: N/A;  1030   COLONOSCOPY WITH PROPOFOL N/A 01/05/2022   Procedure: COLONOSCOPY WITH PROPOFOL;  Surgeon: Harvel Quale, MD;  [facility];  Service: Gastroenterology;  Laterality: N/A;  730 ASA 2   EPIDIDYMIS SURGERY  04/03/1994   ESOPHAGOGASTRODUODENOSCOPY (EGD) WITH PROPOFOL N/A 01/05/2022   Procedure: ESOPHAGOGASTRODUODENOSCOPY (EGD) WITH PROPOFOL;  Surgeon: Harvel Quale, MD;  [facility];  Service: Gastroenterology;  Laterality: N/A;  HERNIA REPAIR     KNEE ARTHROSCOPY W/ MENISCECTOMY  11/01/2008   Right   POLYPECTOMY  01/05/2022   Procedure: POLYPECTOMY;  Surgeon: Harvel Quale, MD;  [facility];  Service: Gastroenterology;;   PRESSURE ULCER DEBRIDEMENT  04/03/2004   Right lower extremity   ROTATOR CUFF REPAIR  07/02/2009   Right    Allergies  Allergies  Allergen Reactions   Metformin And Related Other (See Comments)    Stomach upset   Oxycodone     Other reaction(s): Not available   Rosuvastatin Other (See Comments)    Adverse GI symptoms   Codeine     "Did not  work" and "made me feel foolish"   EKGs/Labs/Other Studies Reviewed:   The following studies were reviewed today:   US carotid arteries 02/22/21  IMPRESSION: Minimal amount of bilateral intimal thickening/atherosclerotic plaque, left subjectively greater than right, morphologically similar to remote examination performed in 2006 and again not resulting in a hemodynamically significant stenosis within either internal carotid artery.  EKG:  EKG is  ordered today.  The ekg ordered today demonstrates NSR, rate 71 bpm  Recent Labs: 06/14/2021: Magnesium 2.1 04/04/2022: ALT 22; BUN 14; Creatinine, Ser 1.22; Hemoglobin 14.1; Platelets 244; Potassium 4.6; Sodium 140; TSH 2.050  Recent Lipid Panel    Component Value Date/Time   CHOL 128 11/15/2021 1102   TRIG 86 11/15/2021 1102   HDL 46 11/15/2021 1102   CHOLHDL 2.8 11/15/2021 1102   CHOLHDL 3.2 01/16/2020 0802   VLDL 17 08/11/2016 0821   LDLCALC 65 11/15/2021 1102   LDLCALC 70 01/16/2020 0802    Home Medications   Current Meds  Medication Sig   acetaminophen (TYLENOL) 650 MG CR tablet Take 650 mg by mouth every 8 (eight) hours as needed for pain.   aspirin 81 MG tablet Take 81 mg by mouth daily.   atorvastatin (LIPITOR) 40 MG tablet Take 1 tablet (40 mg total) by mouth daily. (Patient taking differently: Take 40 mg by mouth at bedtime.)   Continuous Blood Gluc Sensor (FREESTYLE LIBRE 2 SENSOR) MISC Use to monitor blood glucose continuously. Change sensor every 14 days.   Dulaglutide (TRULICITY) 1.5 JQ/3.0SP SOPN Inject 1.5 mg into the skin once a week.   insulin glargine (LANTUS SOLOSTAR) 100 UNIT/ML Solostar Pen Inject 40 Units into the skin at bedtime.   Insulin Pen Needle (TECHLITE PEN NEEDLES) 31G X 8 MM MISC Use as directed once daily   loratadine (CLARITIN) 10 MG tablet Take 1 tablet (10 mg total) by mouth daily.   metoprolol succinate (TOPROL-XL) 50 MG 24 hr tablet TAKE 1 TABLET BY MOUTH  DAILY WITH OR IMMEDIATELY  AFTER A  MEAL   nitroGLYCERIN (NITROSTAT) 0.4 MG SL tablet Place 1 tablet (0.4 mg total) under the tongue every 5 (five) minutes as needed for chest pain.   nystatin (MYCOSTATIN/NYSTOP) powder Apply 1 Application topically daily as needed (Apply topically between toes).   olmesartan (BENICAR) 20 MG tablet Take 1 tablet (20 mg total) by mouth daily. (Patient taking differently: Take 20 mg by mouth at bedtime.)   omeprazole (PRILOSEC) 40 MG capsule Take 1 capsule (40 mg total) by mouth daily. (Patient taking differently: Take 40 mg by mouth at bedtime.)   Vitamin D, Ergocalciferol, (DRISDOL) 1.25 MG (50000 UNIT) CAPS capsule Take 1 capsule (50,000 Units total) by mouth every 7 (seven) days.     Review of Systems      All other systems reviewed and are otherwise negative  except as noted above.  Physical Exam    VS:  BP 128/72   Pulse 71   Ht '5\' 9"'$  (1.753 m)   Wt 206 lb 9.6 oz (93.7 kg)   SpO2 96%   BMI 30.51 kg/m  , BMI Body mass index is 30.51 kg/m.  Wt Readings from Last 3 Encounters:  04/18/22 206 lb 9.6 oz (93.7 kg)  02/17/22 207 lb (93.9 kg)  01/05/22 204 lb (92.5 kg)     GEN: Well nourished, well developed, in no acute distress. HEENT: normal. Neck: Supple, no JVD, carotid bruits, or masses. Cardiac: RRR, no murmurs, rubs, or gallops. No clubbing, cyanosis, edema.  Radials/PT 2+ and equal bilaterally.  Respiratory:  Respirations regular and unlabored, clear to auscultation bilaterally. GI: Soft, nontender, nondistended. MS: No deformity or atrophy. Skin: Warm and dry, no rash. Neuro:  Strength and sensation are intact. Psych: Normal affect.  Assessment & Plan    Preop Eval   Mr. Cookston's perioperative risk of a major cardiac event is 0.9% according to the Revised Cardiac Risk Index (RCRI).  Therefore, he is at low risk for perioperative complications.   His functional capacity is good at 6.36 METs according to the [place] Activity Status Index (DASI). Recommendations: According  to ACC/AHA guidelines, no further cardiovascular testing needed.  The patient may proceed to surgery at acceptable risk.   Antiplatelet and/or Anticoagulation Recommendations: Aspirin can be held for 5 days prior to his surgery.  Please resume Aspirin post operatively when it is felt to be safe from a bleeding standpoint.   CAD s/p PCI 2003 -no chest pain or SOB -continue current medications including: ASA '81mg'$ , lipitor '40mg'$  daily, Toprol-XL '50mg'$  daily, and benicar '20mg'$  daily  HLD -LDL 65 -per PCP  HTN -BP is well controlled today         Disposition: Follow up 3 months with Carlyle Dolly, MD or APP.  Signed, Elgie Collard, PA-C 04/18/2022, 10:43 AM [place]

## 2022-04-18 ENCOUNTER — Ambulatory Visit: Payer: PPO | Admitting: Podiatry

## 2022-04-18 ENCOUNTER — Encounter: Payer: Self-pay | Admitting: Podiatry

## 2022-04-18 ENCOUNTER — Ambulatory Visit: Payer: HMO | Admitting: Physician Assistant

## 2022-04-18 ENCOUNTER — Ambulatory Visit: Payer: PPO | Attending: Physician Assistant | Admitting: Physician Assistant

## 2022-04-18 ENCOUNTER — Encounter: Payer: Self-pay | Admitting: Physician Assistant

## 2022-04-18 VITALS — BP 117/70

## 2022-04-18 VITALS — BP 128/72 | HR 71 | Ht 69.0 in | Wt 206.6 lb

## 2022-04-18 DIAGNOSIS — M79674 Pain in right toe(s): Secondary | ICD-10-CM

## 2022-04-18 DIAGNOSIS — I2510 Atherosclerotic heart disease of native coronary artery without angina pectoris: Secondary | ICD-10-CM | POA: Diagnosis not present

## 2022-04-18 DIAGNOSIS — B351 Tinea unguium: Secondary | ICD-10-CM

## 2022-04-18 DIAGNOSIS — M79675 Pain in left toe(s): Secondary | ICD-10-CM | POA: Diagnosis not present

## 2022-04-18 DIAGNOSIS — E1151 Type 2 diabetes mellitus with diabetic peripheral angiopathy without gangrene: Secondary | ICD-10-CM | POA: Diagnosis not present

## 2022-04-18 DIAGNOSIS — Q828 Other specified congenital malformations of skin: Secondary | ICD-10-CM | POA: Diagnosis not present

## 2022-04-18 DIAGNOSIS — I10 Essential (primary) hypertension: Secondary | ICD-10-CM | POA: Diagnosis not present

## 2022-04-18 DIAGNOSIS — E785 Hyperlipidemia, unspecified: Secondary | ICD-10-CM | POA: Diagnosis not present

## 2022-04-18 DIAGNOSIS — Z01810 Encounter for preprocedural cardiovascular examination: Secondary | ICD-10-CM

## 2022-04-18 NOTE — Progress Notes (Signed)
  Subjective:  Patient ID: Robert [name], male    DOB: 19-Aug-1950,  MRN: [id number]  Robert [name] presents to clinic today for at risk foot care. Pt has h/o NIDDM with PAD and painful porokeratotic lesion(s) both feet and painful mycotic toenails that limit ambulation. Painful toenails interfere with ambulation. Aggravating factors include wearing enclosed shoe gear. Pain is relieved with periodic professional debridement. Painful porokeratotic lesions are aggravated when weightbearing with and without shoegear. Pain is relieved with periodic professional debridement.  Chief Complaint  Patient presents with   Nail Problem    DFC BS-196 A1C-7.0 PCP-Simpson,Margaret PCP VST-02/17/2022   New problem(s): None.   He will be having knee surgery next week.  PCP is Fayrene Helper, MD.  Allergies  Allergen Reactions   Metformin And Related Other (See Comments)    Stomach upset   Oxycodone     Other reaction(s): Not available   Rosuvastatin Other (See Comments)    Adverse GI symptoms   Codeine     "Did not work" and "made me feel foolish"   Review of Systems: Negative except as noted in the HPI.  Objective: No changes noted in today's physical examination. Vitals:   04/18/22 0848  BP: 117/70   Robert [name] is a pleasant 72 y.o. male WD, WN in NAD. AAO x 3.  Neurovascular Examination: CFT immediate b/l LE. Faintly palpable DP/PT pulses b/l LE. Digital hair present b/l. Skin temperature gradient WNL b/l. No pain with calf compression b/l. No edema noted b/l. Mild clubbing of digits 2-5 b/l.  Protective sensation intact 5/5 intact bilaterally with 10g monofilament b/l. Vibratory sensation intact b/l.  Dermatological:  Pedal integument with resolving hyperpigmented patches on the dorsum of both feet.  Webspaces clear b/l.  Hyperkeratotic lesion(s) submet head 5 left foot.  No erythema, no edema, no drainage, no fluctuance.   Porokeratotic lesion(s) multiple (total=5),  on plantar aspect of both feet and lateral aspect of left foot spread out geometrically. No erythema, no edema, no drainage, no fluctuance.  Musculoskeletal:  Normal muscle strength 5/5 to all lower extremity muscle groups bilaterally. Pes planus deformity noted bilateral LE.  No pain, crepitus or joint limitation noted with ROM b/l LE.  Patient ambulates independently without assistive aids.  Assessment/Plan: 1. Pain due to onychomycosis of toenails of both feet   2. Porokeratosis   3. Diabetes mellitus type 2 with peripheral artery disease (HCC)     No orders of the defined types were placed in this encounter.  -Consent given for treatment as described below: -Examined patient. -Continue supportive shoe gear daily. -Mycotic toenails 1-5 bilaterally were debrided in length and girth with sterile nail nippers and dremel without incident. -Porokeratotic lesion(s) b/l feet pared and enucleated with sterile currette. Pinpoint bleeding addressed with Lumicain Hemostatic Solution. TAO and band-aid applied. He may remove on tomorrow. No further treatment required. Total number of lesions debrided=5. -Patient/POA to call should there be question/concern in the interim.   Return in about 3 months (around 07/18/2022).  Marzetta Board, DPM

## 2022-04-18 NOTE — Patient Instructions (Signed)
Medication Instructions:  Your physician recommends that you continue on your current medications as directed. Please refer to the Current Medication list given to you today.  *If you need a refill on your cardiac medications before your next appointment, please call your pharmacy*   Lab Work: None ordered If you have labs (blood work) drawn today and your tests are completely normal, you will receive your results only by: [place] (if you have MyChart) OR A paper copy in the mail If you have any lab test that is abnormal or we need to change your treatment, we will call you to review the results.   Follow-Up: At [facility], you and your health needs are our priority.  As part of our continuing mission to provide you with exceptional heart care, we have created designated Provider Care Teams.  These Care Teams include your primary Cardiologist (physician) and Advanced Practice Providers (APPs -  Physician Assistants and Nurse Practitioners) who all work together to provide you with the care you need, when you need it.   Your next appointment:   3 month(s)  Provider:   Carlyle Dolly, MD

## 2022-04-18 NOTE — Progress Notes (Addendum)
COVID Vaccine received:  _0  No _1  Yes Date of any COVID positive Test in last 90 days: None  PCP - Tula Nakayama, MD Cardiologist -Carlyle Dolly, MD Nicholes Rough, PA-C preop clearance 04-18-22 note  Chest x-ray -  EKG -  04-18-2022  Epic Stress Test -  ECHO -  Cardiac Cath - 2003 in [place], had DES  PCR screen: _2  Ordered & Completed                      _3   No Order but Needs PROFEND                      _4   N/A for this surgery  Surgery Plan:  _5  Ambulatory                            _6  Outpatient in bed                            _7  Admit  Anesthesia:    _8  General  _9  Spinal                           _10   Choice _11   MAC  Pacemaker / ICD device _12  No _13  Yes        Device order form faxed _14  No    _15   Yes      Faxed to:  Spinal Cord Stimulator:_16  No _17  Yes      (Remind patient to bring remote DOS) Other Implants:   History of Sleep Apnea? _18  No _19  Yes   moderate Sleep study: 11-04-2015 Epic CPAP used?- _20  No _21  Yes  can't tolerate  Patient has: _22  Pre-DM   _23  DM1  _24   DM2 Does the patient monitor blood sugar? _25  No _26  Yes  _27  N/A Does patient have a Freestyle Libre 2 or Dexacom? _28  No _29  Yes     CBG at PST- 121 Fasting Blood Sugar Ranges- 90-110 Checks Blood Sugar continous times a day  Last dose of GLP1 agonist- Trulicity on Sunday GLP1 instructions: Patient's last dose was 04-16-22 and he is aware that he's not to take on Sunday 04-23-22. Resume the Sunday after surgery.  Other Diabetic medications/ instructions:  Insulin Glargine  Lantus: 40 units hs  Blood Thinner / Instructions: none Aspirin Instructions:ASA 81 mg  Hold 5 days approved by Nicholes Rough, PA-C 04-18-22 Epic note  ERAS Protocol Ordered: _30  No  _31  Yes PRE-SURGERY _32  ENSURE  _33  G2  Patient is to be NPO after: 08:30 am  Comments:   Activity level: Patient can climb a flight of stairs without difficulty; _34  No CP  _35  No SOB, but would have _knee.  Patient can perform ADLs without  assistance.   Anesthesia review: DM2, OSA ( no CPAP), HTN, CAD-CATH w/ DES done in [place] 2003,   Patient denies shortness of breath, fever, cough and chest pain at PAT appointment.  Patient verbalized understanding and agreement to the Pre-Surgical Instructions that were given to them at this PAT appointment. Patient was also educated of the need to review these PAT instructions again prior to his/her surgery.I reviewed the appropriate phone numbers to call if they have any and questions or concerns.

## 2022-04-18 NOTE — Patient Instructions (Addendum)
SURGICAL WAITING ROOM VISITATION Patients having surgery or a procedure may have no more than 2 support people in the waiting area - these visitors may rotate in the visitor waiting room.   Due to an increase in RSV and influenza rates and associated hospitalizations, children ages 62 and under may not visit patients in [place]. If the patient needs to stay at the hospital during part of their recovery, the visitor guidelines for inpatient rooms apply.  PRE-OP VISITATION  Pre-op nurse will coordinate an appropriate time for 1 support person to accompany the patient in pre-op.  This support person may not rotate.  This visitor will be contacted when the time is appropriate for the visitor to come back in the pre-op area.  Please refer to the [facility] website for the visitor guidelines for Inpatients (after your surgery is over and you are in a regular room).  You are not required to quarantine at this time prior to your surgery. However, you must do this: Hand Hygiene often Do NOT share personal items Notify your provider if you are in close contact with someone who has COVID or you develop fever 100.4 or greater, new onset of sneezing, cough, sore throat, shortness of breath or body aches.  If you test positive for Covid or have been in contact with anyone that has tested positive in the last 10 days please notify you surgeon.    Your procedure is scheduled on: Monday  April 24, 2022   Report to [facility] Main Entrance: [place] entrance where the [employer] is available.   Report to admitting at: 09:00  AM  +++++Call this number if you have any questions or problems the morning of surgery [phone]  Do not eat food after Midnight the night prior to your surgery/procedure.  After Midnight you may have the following liquids until  08:30 AM  DAY OF SURGERY  Clear Liquid Diet Water Black Coffee (sugar ok, NO MILK/CREAM OR CREAMERS)  Tea (sugar ok, NO  MILK/CREAM OR CREAMERS) regular and decaf                             Plain Jell-O  with no fruit (NO RED)                                           Fruit ices (not with fruit pulp, NO RED)                                     Popsicles (NO RED)                                                                  Juice: apple, WHITE grape, WHITE cranberry Sports drinks like Gatorade or Powerade (NO RED)                    The day of surgery:  Drink ONE (1) Pre-Surgery G2 at   08:30  AM the morning of surgery. Drink in one sitting.  Do not sip.  This drink was given to you during your hospital pre-op appointment visit. Nothing else to drink after completing the Pre-Surgery  G2 : No candy, chewing gum or throat lozenges.    FOLLOW ANY ADDITIONAL PRE OP INSTRUCTIONS YOU RECEIVED FROM YOUR SURGEON'S OFFICE!!!   Oral Hygiene is also important to reduce your risk of infection.        Remember - BRUSH YOUR TEETH THE MORNING OF SURGERY WITH YOUR REGULAR TOOTHPASTE  Do NOT smoke after Midnight the night before surgery.  ASPIRIN:  Hold for 5 days prior to surgery.  Last day to take: 04-18-22   DIABETIC Medication:    Insulin Glargine (Lantus)  take 1/2 your regular dose (take 20 units) the night before surgery.   Trulicity:  Will not take on Sunday 04-23-22 and patient will resume on Sunday after 04-30-22  Take ONLY these medicines the morning of surgery with A SIP OF WATER: Loratadine (Claritin)                  You may not have any metal on your body including jewelry, and body piercing  Do not wear lotions, powders, cologne, or deodorant  Men may shave face and neck.  You may bring a small overnight bag with you on the day of surgery, only pack items that are not valuable. [facility] IS NOT RESPONSIBLE   FOR VALUABLES THAT ARE LOST OR STOLEN.   Do not bring your home medications to the hospital. The Pharmacy will dispense medications listed on your medication list to you during your  admission in the Hospital.  Please read over the following fact sheets you were given: IF YOU HAVE QUESTIONS ABOUT YOUR PRE-OP INSTRUCTIONS, PLEASE CALL [phone]  ([place])   [facility] - Preparing for Surgery Before surgery, you can play an important role.  Because skin is not sterile, your skin needs to be as free of germs as possible.  You can reduce the number of germs on your skin by washing with CHG (chlorahexidine gluconate) soap before surgery.  CHG is an antiseptic cleaner which kills germs and bonds with the skin to continue killing germs even after washing. Please DO NOT use if you have an allergy to CHG or antibacterial soaps.  If your skin becomes reddened/irritated stop using the CHG and inform your nurse when you arrive at Short Stay. Do not shave (including legs and underarms) for at least 48 hours prior to the first CHG shower.  You may shave your face/neck.  Please follow these instructions carefully:  1.  Shower with CHG Soap the night before surgery and the  morning of surgery.  2.  If you choose to wash your hair, wash your hair first as usual with your normal  shampoo.  3.  After you shampoo, rinse your hair and body thoroughly to remove the shampoo.                             4.  Use CHG as you would any other liquid soap.  You can apply chg directly to the skin and wash.  Gently with a scrungie or clean washcloth.  5.  Apply the CHG Soap to your body ONLY FROM THE NECK DOWN.   Do not use on face/ open  Wound or open sores. Avoid contact with eyes, ears mouth and genitals (private parts).                       Wash face,  Genitals (private parts) with your normal soap.             6.  Wash thoroughly, paying special attention to the area where your  surgery  will be performed.  7.  Thoroughly rinse your body with warm water from the neck down.  8.  DO NOT shower/wash with your normal soap after using and rinsing off the CHG Soap.            9.   Pat yourself dry with a clean towel.            10.  Wear clean pajamas.            11.  Place clean sheets on your bed the night of your first shower and do not  sleep with pets.  ON THE DAY OF SURGERY : Do not apply any lotions/deodorants the morning of surgery.  Please wear clean clothes to the hospital/surgery center.    FAILURE TO FOLLOW THESE INSTRUCTIONS MAY RESULT IN THE CANCELLATION OF YOUR SURGERY  PATIENT SIGNATURE_________________________________  NURSE SIGNATURE__________________________________  ________________________________________________________________________       Adam Phenix    An incentive spirometer is a tool that can help keep your lungs clear and active. This tool measures how well you are filling your lungs with each breath. Taking long deep breaths may help reverse or decrease the chance of developing breathing (pulmonary) problems (especially infection) following: A long period of time when you are unable to move or be active. BEFORE THE PROCEDURE  If the spirometer includes an indicator to show your best effort, your nurse or respiratory therapist will set it to a desired goal. If possible, sit up straight or lean slightly forward. Try not to slouch. Hold the incentive spirometer in an upright position. INSTRUCTIONS FOR USE  Sit on the edge of your bed if possible, or sit up as far as you can in bed or on a chair. Hold the incentive spirometer in an upright position. Breathe out normally. Place the mouthpiece in your mouth and seal your lips tightly around it. Breathe in slowly and as deeply as possible, raising the piston or the ball toward the top of the column. Hold your breath for 3-5 seconds or for as long as possible. Allow the piston or ball to fall to the bottom of the column. Remove the mouthpiece from your mouth and breathe out normally. Rest for a few seconds and repeat Steps 1 through 7 at least 10 times every 1-2 hours when  you are awake. Take your time and take a few normal breaths between deep breaths. The spirometer may include an indicator to show your best effort. Use the indicator as a goal to work toward during each repetition. After each set of 10 deep breaths, practice coughing to be sure your lungs are clear. If you have an incision (the cut made at the time of surgery), support your incision when coughing by placing a pillow or rolled up towels firmly against it. Once you are able to get out of bed, walk around indoors and cough well. You may stop using the incentive spirometer when instructed by your caregiver.  RISKS AND COMPLICATIONS Take your time so you do not get dizzy or light-headed. If you are  in pain, you may need to take or ask for pain medication before doing incentive spirometry. It is harder to take a deep breath if you are having pain. AFTER USE Rest and breathe slowly and easily. It can be helpful to keep track of a log of your progress. Your caregiver can provide you with a simple table to help with this. If you are using the spirometer at home, follow these instructions: [place] IF:  You are having difficultly using the spirometer. You have trouble using the spirometer as often as instructed. Your pain medication is not giving enough relief while using the spirometer. You develop fever of 100.5 F (38.1 C) or higher.                                                                                                    SEEK IMMEDIATE MEDICAL CARE IF:  You cough up bloody sputum that had not been present before. You develop fever of 102 F (38.9 C) or greater. You develop worsening pain at or near the incision site. MAKE SURE YOU:  Understand these instructions. Will watch your condition. Will get help right away if you are not doing well or get worse. Document Released: 07/31/2006 Document Revised: 06/12/2011 Document Reviewed: 10/01/2006 [facility] Patient Information 2014  [place], [place].

## 2022-04-20 ENCOUNTER — Other Ambulatory Visit: Payer: Self-pay

## 2022-04-20 ENCOUNTER — Encounter (HOSPITAL_COMMUNITY)
Admission: RE | Admit: 2022-04-20 | Discharge: 2022-04-20 | Disposition: A | Discharge status: Home / Self Care | Payer: PPO | Source: Ambulatory Visit | Attending: Orthopedic Surgery | Admitting: Orthopedic Surgery

## 2022-04-20 ENCOUNTER — Encounter (HOSPITAL_COMMUNITY): Payer: Self-pay

## 2022-04-20 VITALS — BP 119/70 | HR 73 | Temp 98.5°F | Resp 16 | Ht 69.0 in | Wt 205.0 lb

## 2022-04-20 DIAGNOSIS — I10 Essential (primary) hypertension: Secondary | ICD-10-CM | POA: Insufficient documentation

## 2022-04-20 DIAGNOSIS — Z01812 Encounter for preprocedural laboratory examination: Secondary | ICD-10-CM | POA: Diagnosis not present

## 2022-04-20 DIAGNOSIS — Z01818 Encounter for other preprocedural examination: Secondary | ICD-10-CM

## 2022-04-20 DIAGNOSIS — E119 Type 2 diabetes mellitus without complications: Secondary | ICD-10-CM | POA: Insufficient documentation

## 2022-04-20 DIAGNOSIS — Z87891 Personal history of nicotine dependence: Secondary | ICD-10-CM | POA: Insufficient documentation

## 2022-04-20 DIAGNOSIS — I2510 Atherosclerotic heart disease of native coronary artery without angina pectoris: Secondary | ICD-10-CM | POA: Insufficient documentation

## 2022-04-20 DIAGNOSIS — M1711 Unilateral primary osteoarthritis, right knee: Secondary | ICD-10-CM | POA: Diagnosis not present

## 2022-04-20 DIAGNOSIS — G4730 Sleep apnea, unspecified: Secondary | ICD-10-CM | POA: Diagnosis not present

## 2022-04-20 HISTORY — DX: Pneumonia, unspecified organism: J18.9

## 2022-04-20 LAB — SURGICAL PCR SCREEN
MRSA, PCR: NEGATIVE
Staphylococcus aureus: NEGATIVE

## 2022-04-21 NOTE — Anesthesia Preprocedure Evaluation (Addendum)
Anesthesia Evaluation  Patient identified by MRN, date of birth, ID band Patient awake    Reviewed: Allergy & Precautions, H&P , NPO status , Patient's Chart, lab work & pertinent test results  Airway Mallampati: II   Neck ROM: full    Dental   Pulmonary sleep apnea , former smoker   breath sounds clear to auscultation       Cardiovascular hypertension, + CAD and + Cardiac Stents   Rhythm:regular Rate:Normal     Neuro/Psych    GI/Hepatic   Endo/Other  diabetes, Type 2    Renal/GU      Musculoskeletal  (+) Arthritis ,    Abdominal   Peds  Hematology   Anesthesia Other Findings   Reproductive/Obstetrics                             Anesthesia Physical Anesthesia Plan  ASA: 3  Anesthesia Plan: MAC and Spinal   Post-op Pain Management: Regional block*   Induction: Intravenous  PONV Risk Score and Plan: 1 and Ondansetron, Propofol infusion and Treatment may vary due to age or medical condition  Airway Management Planned: Simple Face Mask  Additional Equipment:   Intra-op Plan:   Post-operative Plan:   Informed Consent: I have reviewed the patients History and Physical, chart, labs and discussed the procedure including the risks, benefits and alternatives for the proposed anesthesia with the patient or authorized representative who has indicated his/her understanding and acceptance.     Dental advisory given  Plan Discussed with: CRNA, Anesthesiologist and Surgeon  Anesthesia Plan Comments: (See PAT note 04/20/2022)       Anesthesia Quick Evaluation

## 2022-04-21 NOTE — Progress Notes (Signed)
Anesthesia Chart Review   Case: [id number] Date/Time: 04/24/22 1120   Procedure: TOTAL KNEE ARTHROPLASTY (Right: Knee)   Anesthesia type: Choice   Pre-op diagnosis: right knee osteoarthritis   Location: [place] 09 / [facility] ORS   Surgeons: Gaynelle Arabian, MD       DISCUSSION:72 y.o. former smoker with h/o HTN, DM II, CAD (DES 2003), sleep apnea, right knee OA scheduled for above procedure 04/24/22 with Dr. Gaynelle Arabian.   Pt last seen by cardiology 04/18/2022. Per OV note, "Mr. Kite's perioperative risk of a major cardiac event is 0.9% according to the Revised Cardiac Risk Index (RCRI).  Therefore, he is at low risk for perioperative complications.   His functional capacity is good at 6.36 METs according to the Duke Activity Status Index (DASI). Recommendations: According to ACC/AHA guidelines, no further cardiovascular testing needed.  The patient may proceed to surgery at acceptable risk.   Antiplatelet and/or Anticoagulation Recommendations: Aspirin can be held for 5 days prior to his surgery.  Please resume Aspirin post operatively when it is felt to be safe from a bleeding standpoint."  Anticipate pt can proceed with planned procedure barring acute status change.   VS: BP 119/70 Comment: right arm sitting  Pulse 73   Temp 36.9 C (Oral)   Resp 16   Ht '5\' 9"'$  (1.753 m)   Wt 93 kg   SpO2 98%   BMI 30.27 kg/m   PROVIDERS: Fayrene Helper, MD is PCP   Cardiologist:  Carlyle Dolly, MD   LABS: Labs reviewed: Acceptable for surgery. (all labs ordered are listed, but only abnormal results are displayed)  Labs Reviewed  SURGICAL PCR SCREEN     IMAGES:   EKG:   CV: Myocardial Perfusion 08/27/2014 Low risk Duke treadmill score of 7. Blood pressure demonstrated a hypertensive response to exercise. There was no ST segment deviation noted during stress. There is a small defect of mild severity present in the basal inferolateral, mid inferolateral and apical inferior  location. The defect is reversible. Consistent with mild, predominantly mid to basal inferolateral ischemia. Myocardial perfusion is abnormal. This is a low risk study.   Past Medical History:  Diagnosis Date   Arteriosclerotic cardiovascular disease (ASCVD)    DES to LAD D1 - 2003 in [place]; failed intervention for a totally obstructed RCA at that time; EF of 45%; 12/2009: Equivocal stress nuclear with good exercise tolerance, negative stress EKG, normal EF with mild mid and distal inferior ischemia   Arthritis    Diabetes mellitus, insulin dependent (IDDM), controlled 10/25/2012   HBA1C is 6.9 on 10/22/2012, [employer]rees[employer] 04/26/2020   Diabetes mellitus, type II ([place])    Hyperlipidemia    Lipid profile in 09/2011:128, 130, 33, 69   Hypertension    Lab  09/2011: Normal CMet ex G-133   Pneumonia    Sleep apnea    no CPAP, can't tolerate   Tobacco abuse, in remission    30 pack years; quit in 1985    Past Surgical History:  Procedure Laterality Date   BIOPSY  01/05/2022   Procedure: BIOPSY;  Surgeon: Montez Morita, Quillian Quince, MD;  [facility];  Service: Gastroenterology;;   bone spur Left    foot big toe   CARDIAC CATHETERIZATION  2003   DES placed,  done in [place]   COLONOSCOPY  04/03/2008   Negative screening study   COLONOSCOPY N/A 11/11/2014   Procedure: COLONOSCOPY;  Surgeon: Rogene Houston, MD;  Location: [facility];  Service: Endoscopy;  Laterality: N/A;  1030   COLONOSCOPY WITH PROPOFOL N/A 01/05/2022   Procedure: COLONOSCOPY WITH PROPOFOL;  Surgeon: Harvel Quale, MD;  Location[facility];  Service: Gastroenterology;  Laterality: N/A;  730 ASA 2   EPIDIDYMIS SURGERY  04/03/1994   ESOPHAGOGASTRODUODENOSCOPY (EGD) WITH PROPOFOL N/A 01/05/2022   Procedure: ESOPHAGOGASTRODUODENOSCOPY (EGD) WITH PROPOFOL;  Surgeon: Harvel Quale, MD;  [facility];  Service: Gastroenterology;  Laterality: N/A;   HERNIA REPAIR     umbilical as  a baby   KNEE ARTHROSCOPY W/ MENISCECTOMY  11/01/2008   Right   POLYPECTOMY  01/05/2022   Procedure: POLYPECTOMY;  Surgeon: Montez Morita, Quillian Quince, MD;  Location: [facility];  Service: Gastroenterology;;   PRESSURE ULCER DEBRIDEMENT  04/03/2004   Right lower extremity   ROTATOR CUFF REPAIR  07/02/2009   Right    MEDICATIONS:  acetaminophen (TYLENOL) 650 MG CR tablet   aspirin 81 MG tablet   atorvastatin (LIPITOR) 40 MG tablet   Continuous Blood Gluc Sensor (FREESTYLE LIBRE 2 SENSOR) MISC   Dulaglutide (TRULICITY) 1.5 GG/2.6RS SOPN   insulin glargine (LANTUS SOLOSTAR) 100 UNIT/ML Solostar Pen   Insulin Pen Needle (TECHLITE PEN NEEDLES) 31G X 8 MM MISC   loratadine (CLARITIN) 10 MG tablet   metoprolol succinate (TOPROL-XL) 50 MG 24 hr tablet   nitroGLYCERIN (NITROSTAT) 0.4 MG SL tablet   nystatin (MYCOSTATIN/NYSTOP) powder   olmesartan (BENICAR) 20 MG tablet   omeprazole (PRILOSEC) 40 MG capsule   Vitamin D, Ergocalciferol, (DRISDOL) 1.25 MG (50000 UNIT) CAPS capsule   No current facility-administered medications for this encounter.    Robert Felix Ward, PA-C [facility] Pre-Surgical Testing [phone]

## 2022-04-24 ENCOUNTER — Encounter (HOSPITAL_COMMUNITY)
Admission: RE | Disposition: A | Discharge status: Home / Self Care | Payer: Self-pay | Source: Home / Self Care | Attending: Orthopedic Surgery

## 2022-04-24 ENCOUNTER — Other Ambulatory Visit: Payer: Self-pay

## 2022-04-24 ENCOUNTER — Ambulatory Visit (HOSPITAL_BASED_OUTPATIENT_CLINIC_OR_DEPARTMENT_OTHER): Payer: PPO | Admitting: Anesthesiology

## 2022-04-24 ENCOUNTER — Ambulatory Visit (HOSPITAL_COMMUNITY): Payer: PPO | Admitting: Anesthesiology

## 2022-04-24 ENCOUNTER — Encounter (HOSPITAL_COMMUNITY): Payer: Self-pay | Admitting: Orthopedic Surgery

## 2022-04-24 ENCOUNTER — Observation Stay (HOSPITAL_COMMUNITY)
Admission: RE | Admit: 2022-04-24 | Discharge: 2022-04-25 | Disposition: A | Discharge status: Home / Self Care | Payer: PPO | Attending: Orthopedic Surgery | Admitting: Orthopedic Surgery

## 2022-04-24 DIAGNOSIS — I10 Essential (primary) hypertension: Secondary | ICD-10-CM

## 2022-04-24 DIAGNOSIS — Z794 Long term (current) use of insulin: Secondary | ICD-10-CM | POA: Diagnosis not present

## 2022-04-24 DIAGNOSIS — I2510 Atherosclerotic heart disease of native coronary artery without angina pectoris: Secondary | ICD-10-CM | POA: Diagnosis not present

## 2022-04-24 DIAGNOSIS — Z87891 Personal history of nicotine dependence: Secondary | ICD-10-CM

## 2022-04-24 DIAGNOSIS — Z7985 Long-term (current) use of injectable non-insulin antidiabetic drugs: Secondary | ICD-10-CM | POA: Diagnosis not present

## 2022-04-24 DIAGNOSIS — Z79899 Other long term (current) drug therapy: Secondary | ICD-10-CM | POA: Diagnosis not present

## 2022-04-24 DIAGNOSIS — E119 Type 2 diabetes mellitus without complications: Secondary | ICD-10-CM

## 2022-04-24 DIAGNOSIS — M179 Osteoarthritis of knee, unspecified: Secondary | ICD-10-CM | POA: Diagnosis present

## 2022-04-24 DIAGNOSIS — M1711 Unilateral primary osteoarthritis, right knee: Secondary | ICD-10-CM | POA: Diagnosis not present

## 2022-04-24 DIAGNOSIS — Z7982 Long term (current) use of aspirin: Secondary | ICD-10-CM | POA: Diagnosis not present

## 2022-04-24 DIAGNOSIS — G8918 Other acute postprocedural pain: Secondary | ICD-10-CM | POA: Diagnosis not present

## 2022-04-24 HISTORY — PX: TOTAL KNEE ARTHROPLASTY: SHX125

## 2022-04-24 LAB — GLUCOSE, CAPILLARY
Glucose-Capillary: 136 mg/dL — ABNORMAL HIGH (ref 70–99)
Glucose-Capillary: 78 mg/dL (ref 70–99)
Glucose-Capillary: 125 mg/dL — ABNORMAL HIGH (ref 70–99)
Glucose-Capillary: 270 mg/dL — ABNORMAL HIGH (ref 70–99)

## 2022-04-24 SURGERY — ARTHROPLASTY, KNEE, TOTAL
Anesthesia: Monitor Anesthesia Care | Site: Knee | Laterality: Right

## 2022-04-24 MED ORDER — FENTANYL CITRATE PF 50 MCG/ML IJ SOSY: 25.0000 ug | PREFILLED_SYRINGE | INTRAMUSCULAR | Status: DC | PRN

## 2022-04-24 MED ORDER — PROPOFOL 500 MG/50ML IV EMUL
INTRAVENOUS | Status: DC | PRN
  Administered 2022-04-24: 100 ug/kg/min via INTRAVENOUS

## 2022-04-24 MED ORDER — POVIDONE-IODINE 10 % EX SWAB: 2.0000 | Freq: Once | CUTANEOUS | Status: DC

## 2022-04-24 MED ORDER — METHOCARBAMOL 500 MG PO TABS
500.0000 mg | ORAL_TABLET | Freq: Four times a day (QID) | ORAL | Status: DC | PRN
  Administered 2022-04-25: 500 mg via ORAL
  Filled 2022-04-24: qty 1

## 2022-04-24 MED ORDER — DEXAMETHASONE SODIUM PHOSPHATE 10 MG/ML IJ SOLN
10.0000 mg | Freq: Once | INTRAMUSCULAR | Status: AC
  Administered 2022-04-25: 10 mg via INTRAVENOUS
  Filled 2022-04-24: qty 1

## 2022-04-24 MED ORDER — BUPIVACAINE IN DEXTROSE 0.75-8.25 % IT SOLN
INTRATHECAL | Status: DC | PRN
  Administered 2022-04-24: 1.8 mL via INTRATHECAL

## 2022-04-24 MED ORDER — BISACODYL 10 MG RE SUPP: 10.0000 mg | Freq: Every day | RECTAL | Status: DC | PRN

## 2022-04-24 MED ORDER — CEFAZOLIN SODIUM-DEXTROSE 2-4 GM/100ML-% IV SOLN
2.0000 g | INTRAVENOUS | Status: AC
  Administered 2022-04-24: 2 g via INTRAVENOUS
  Filled 2022-04-24: qty 100

## 2022-04-24 MED ORDER — METHOCARBAMOL 500 MG IVPB - SIMPLE MED: 500.0000 mg | Freq: Four times a day (QID) | INTRAVENOUS | Status: DC | PRN

## 2022-04-24 MED ORDER — MIDAZOLAM HCL 2 MG/2ML IJ SOLN
0.5000 mg | Freq: Once | INTRAMUSCULAR | Status: DC
  Filled 2022-04-24: qty 2

## 2022-04-24 MED ORDER — BUPIVACAINE LIPOSOME 1.3 % IJ SUSP: 20.0000 mL | Freq: Once | INTRAMUSCULAR | Status: DC

## 2022-04-24 MED ORDER — SODIUM CHLORIDE (PF) 0.9 % IJ SOLN
INTRAMUSCULAR | Status: AC
  Filled 2022-04-24: qty 10

## 2022-04-24 MED ORDER — HYDROMORPHONE HCL 1 MG/ML IJ SOLN: 0.5000 mg | INTRAMUSCULAR | Status: DC | PRN

## 2022-04-24 MED ORDER — LORATADINE 10 MG PO TABS
10.0000 mg | ORAL_TABLET | Freq: Every day | ORAL | Status: DC
  Administered 2022-04-25: 10 mg via ORAL
  Filled 2022-04-24: qty 1

## 2022-04-24 MED ORDER — MENTHOL 3 MG MT LOZG: 1.0000 | LOZENGE | OROMUCOSAL | Status: DC | PRN

## 2022-04-24 MED ORDER — ASPIRIN 325 MG PO TBEC
325.0000 mg | DELAYED_RELEASE_TABLET | Freq: Two times a day (BID) | ORAL | Status: DC
  Administered 2022-04-24 – 2022-04-25 (×2): 325 mg via ORAL
  Filled 2022-04-24 (×2): qty 1

## 2022-04-24 MED ORDER — ONDANSETRON HCL 4 MG/2ML IJ SOLN: 4.0000 mg | Freq: Four times a day (QID) | INTRAMUSCULAR | Status: DC | PRN

## 2022-04-24 MED ORDER — HYDROMORPHONE HCL 2 MG PO TABS
1.0000 mg | ORAL_TABLET | ORAL | Status: DC | PRN
  Administered 2022-04-24: 2 mg via ORAL
  Filled 2022-04-24: qty 1

## 2022-04-24 MED ORDER — TRANEXAMIC ACID-NACL 1000-0.7 MG/100ML-% IV SOLN
1000.0000 mg | INTRAVENOUS | Status: AC
  Administered 2022-04-24: 1000 mg via INTRAVENOUS
  Filled 2022-04-24: qty 100

## 2022-04-24 MED ORDER — STERILE WATER FOR IRRIGATION IR SOLN
Status: DC | PRN
  Administered 2022-04-24: 2000 mL

## 2022-04-24 MED ORDER — LACTATED RINGERS IV SOLN: INTRAVENOUS | Status: DC

## 2022-04-24 MED ORDER — SODIUM CHLORIDE 0.9 % IV SOLN: INTRAVENOUS | Status: DC

## 2022-04-24 MED ORDER — HYDROMORPHONE HCL 2 MG PO TABS
2.0000 mg | ORAL_TABLET | ORAL | Status: DC | PRN
  Administered 2022-04-24 – 2022-04-25 (×3): 2 mg via ORAL
  Filled 2022-04-24 (×3): qty 1

## 2022-04-24 MED ORDER — INSULIN ASPART 100 UNIT/ML IJ SOLN
0.0000 [IU] | Freq: Three times a day (TID) | INTRAMUSCULAR | Status: DC
  Administered 2022-04-24: 2 [IU] via SUBCUTANEOUS

## 2022-04-24 MED ORDER — PANTOPRAZOLE SODIUM 40 MG PO TBEC: 80.0000 mg | DELAYED_RELEASE_TABLET | Freq: Every day | ORAL | Status: DC

## 2022-04-24 MED ORDER — PHENOL 1.4 % MT LIQD: 1.0000 | OROMUCOSAL | Status: DC | PRN

## 2022-04-24 MED ORDER — PHENYLEPHRINE HCL-NACL 20-0.9 MG/250ML-% IV SOLN
INTRAVENOUS | Status: DC | PRN
  Administered 2022-04-24: 40 ug/min via INTRAVENOUS

## 2022-04-24 MED ORDER — METOPROLOL SUCCINATE ER 50 MG PO TB24
50.0000 mg | ORAL_TABLET | Freq: Every day | ORAL | Status: DC
  Administered 2022-04-24: 50 mg via ORAL
  Filled 2022-04-24 (×2): qty 1

## 2022-04-24 MED ORDER — FENTANYL CITRATE PF 50 MCG/ML IJ SOSY
25.0000 ug | PREFILLED_SYRINGE | Freq: Once | INTRAMUSCULAR | Status: AC
  Administered 2022-04-24: 50 ug via INTRAVENOUS
  Filled 2022-04-24: qty 2

## 2022-04-24 MED ORDER — PHENYLEPHRINE HCL (PRESSORS) 10 MG/ML IV SOLN
INTRAVENOUS | Status: AC
  Filled 2022-04-24: qty 1

## 2022-04-24 MED ORDER — ACETAMINOPHEN 500 MG PO TABS
1000.0000 mg | ORAL_TABLET | Freq: Four times a day (QID) | ORAL | Status: DC
  Administered 2022-04-24 – 2022-04-25 (×3): 1000 mg via ORAL
  Filled 2022-04-24 (×3): qty 2

## 2022-04-24 MED ORDER — ATORVASTATIN CALCIUM 40 MG PO TABS
40.0000 mg | ORAL_TABLET | Freq: Every day | ORAL | Status: DC
  Administered 2022-04-24: 40 mg via ORAL
  Filled 2022-04-24: qty 1

## 2022-04-24 MED ORDER — SODIUM CHLORIDE (PF) 0.9 % IJ SOLN
INTRAMUSCULAR | Status: DC | PRN
  Administered 2022-04-24: 60 mL

## 2022-04-24 MED ORDER — BUPIVACAINE LIPOSOME 1.3 % IJ SUSP
INTRAMUSCULAR | Status: DC | PRN
  Administered 2022-04-24: 20 mL

## 2022-04-24 MED ORDER — BUPIVACAINE LIPOSOME 1.3 % IJ SUSP
INTRAMUSCULAR | Status: AC
  Filled 2022-04-24: qty 20

## 2022-04-24 MED ORDER — TRAMADOL HCL 50 MG PO TABS: 50.0000 mg | ORAL_TABLET | Freq: Four times a day (QID) | ORAL | Status: DC | PRN

## 2022-04-24 MED ORDER — METOCLOPRAMIDE HCL 5 MG/ML IJ SOLN: 5.0000 mg | Freq: Three times a day (TID) | INTRAMUSCULAR | Status: DC | PRN

## 2022-04-24 MED ORDER — SODIUM CHLORIDE (PF) 0.9 % IJ SOLN
INTRAMUSCULAR | Status: AC
  Filled 2022-04-24: qty 50

## 2022-04-24 MED ORDER — DOCUSATE SODIUM 100 MG PO CAPS
100.0000 mg | ORAL_CAPSULE | Freq: Two times a day (BID) | ORAL | Status: DC
  Administered 2022-04-25: 100 mg via ORAL
  Filled 2022-04-24 (×2): qty 1

## 2022-04-24 MED ORDER — DEXAMETHASONE SODIUM PHOSPHATE 10 MG/ML IJ SOLN
INTRAMUSCULAR | Status: AC
  Filled 2022-04-24: qty 1

## 2022-04-24 MED ORDER — DIPHENHYDRAMINE HCL 12.5 MG/5ML PO ELIX: 12.5000 mg | ORAL_SOLUTION | ORAL | Status: DC | PRN

## 2022-04-24 MED ORDER — SODIUM CHLORIDE 0.9 % IR SOLN
Status: DC | PRN
  Administered 2022-04-24: 1000 mL

## 2022-04-24 MED ORDER — DEXAMETHASONE SODIUM PHOSPHATE 10 MG/ML IJ SOLN
8.0000 mg | Freq: Once | INTRAMUSCULAR | Status: AC
  Administered 2022-04-24: 8 mg via INTRAVENOUS

## 2022-04-24 MED ORDER — ROPIVACAINE HCL 5 MG/ML IJ SOLN
INTRAMUSCULAR | Status: DC | PRN
  Administered 2022-04-24: 25 mL via PERINEURAL

## 2022-04-24 MED ORDER — POLYETHYLENE GLYCOL 3350 17 G PO PACK
17.0000 g | PACK | Freq: Every day | ORAL | Status: DC | PRN
  Administered 2022-04-24: 17 g via ORAL
  Filled 2022-04-24: qty 1

## 2022-04-24 MED ORDER — 0.9 % SODIUM CHLORIDE (POUR BTL) OPTIME
TOPICAL | Status: DC | PRN
  Administered 2022-04-24: 1000 mL

## 2022-04-24 MED ORDER — INSULIN GLARGINE-YFGN 100 UNIT/ML ~~LOC~~ SOLN
40.0000 [IU] | Freq: Every day | SUBCUTANEOUS | Status: DC
  Administered 2022-04-24: 40 [IU] via SUBCUTANEOUS
  Filled 2022-04-24 (×2): qty 0.4

## 2022-04-24 MED ORDER — ONDANSETRON HCL 4 MG/2ML IJ SOLN
INTRAMUSCULAR | Status: AC
  Filled 2022-04-24: qty 2

## 2022-04-24 MED ORDER — METOCLOPRAMIDE HCL 5 MG PO TABS: 5.0000 mg | ORAL_TABLET | Freq: Three times a day (TID) | ORAL | Status: DC | PRN

## 2022-04-24 MED ORDER — ACETAMINOPHEN 10 MG/ML IV SOLN
1000.0000 mg | Freq: Once | INTRAVENOUS | Status: AC
  Administered 2022-04-24: 1000 mg via INTRAVENOUS
  Filled 2022-04-24: qty 100

## 2022-04-24 MED ORDER — PROPOFOL 10 MG/ML IV BOLUS
INTRAVENOUS | Status: DC | PRN
  Administered 2022-04-24: 40 mg via INTRAVENOUS

## 2022-04-24 MED ORDER — CHLORHEXIDINE GLUCONATE 0.12 % MT SOLN
15.0000 mL | Freq: Once | OROMUCOSAL | Status: AC
  Administered 2022-04-24: 15 mL via OROMUCOSAL

## 2022-04-24 MED ORDER — ONDANSETRON HCL 4 MG PO TABS: 4.0000 mg | ORAL_TABLET | Freq: Four times a day (QID) | ORAL | Status: DC | PRN

## 2022-04-24 MED ORDER — PHENYLEPHRINE 80 MCG/ML (10ML) SYRINGE FOR IV PUSH (FOR BLOOD PRESSURE SUPPORT)
PREFILLED_SYRINGE | INTRAVENOUS | Status: AC
  Filled 2022-04-24: qty 10

## 2022-04-24 MED ORDER — ORAL CARE MOUTH RINSE: 15.0000 mL | Freq: Once | OROMUCOSAL | Status: AC

## 2022-04-24 MED ORDER — ONDANSETRON HCL 4 MG/2ML IJ SOLN
INTRAMUSCULAR | Status: DC | PRN
  Administered 2022-04-24: 4 mg via INTRAVENOUS

## 2022-04-24 MED ORDER — FLEET ENEMA 7-19 GM/118ML RE ENEM: 1.0000 | ENEMA | Freq: Once | RECTAL | Status: DC | PRN

## 2022-04-24 MED ORDER — CEFAZOLIN SODIUM-DEXTROSE 2-4 GM/100ML-% IV SOLN
2.0000 g | Freq: Four times a day (QID) | INTRAVENOUS | Status: AC
  Administered 2022-04-24 (×2): 2 g via INTRAVENOUS
  Filled 2022-04-24 (×2): qty 100

## 2022-04-24 SURGICAL SUPPLY — 57 items
ATTUNE MED DOME PAT 38 KNEE (Knees) IMPLANT
ATTUNE PS FEM RT SZ 7 CEM KNEE (Femur) IMPLANT
ATTUNE PSRP INSR SZ7 8 KNEE (Insert) IMPLANT
BAG COUNTER SPONGE SURGICOUNT (BAG) IMPLANT
BAG SPEC THK2 15X12 ZIP CLS (MISCELLANEOUS) ×1
BAG SPNG CNTER NS LX DISP (BAG)
BAG ZIPLOCK 12X15 (MISCELLANEOUS) ×1 IMPLANT
BASE TIBIAL ROT PLAT SZ 7 KNEE (Knees) IMPLANT
BLADE SAG 18X100X1.27 (BLADE) ×1 IMPLANT
BLADE SAW SGTL 11.0X1.19X90.0M (BLADE) ×1 IMPLANT
BNDG ELASTIC 4X5.8 VLCR STR LF (GAUZE/BANDAGES/DRESSINGS) IMPLANT
BNDG ELASTIC 6X5.8 VLCR STR LF (GAUZE/BANDAGES/DRESSINGS) ×1 IMPLANT
BOWL SMART MIX CTS (DISPOSABLE) ×1 IMPLANT
BSPLAT TIB 7 CMNT ROT PLAT STR (Knees) ×1 IMPLANT
CEMENT HV SMART SET (Cement) ×2 IMPLANT
COVER SURGICAL LIGHT HANDLE (MISCELLANEOUS) ×1 IMPLANT
CUFF TOURN SGL QUICK 34 (TOURNIQUET CUFF) ×1
CUFF TRNQT CYL 34X4.125X (TOURNIQUET CUFF) ×1 IMPLANT
DRAPE INCISE IOBAN 66X45 STRL (DRAPES) ×1 IMPLANT
DRAPE U-SHAPE 47X51 STRL (DRAPES) ×1 IMPLANT
DRSG AQUACEL AG ADV 3.5X10 (GAUZE/BANDAGES/DRESSINGS) ×1 IMPLANT
DURAPREP 26ML APPLICATOR (WOUND CARE) ×1 IMPLANT
ELECT REM PT RETURN 15FT ADLT (MISCELLANEOUS) ×1 IMPLANT
GLOVE BIO SURGEON STRL SZ 6.5 (GLOVE) IMPLANT
GLOVE BIO SURGEON STRL SZ7.5 (GLOVE) IMPLANT
GLOVE BIO SURGEON STRL SZ8 (GLOVE) ×1 IMPLANT
GLOVE BIOGEL PI IND STRL 6.5 (GLOVE) IMPLANT
GLOVE BIOGEL PI IND STRL 7.0 (GLOVE) IMPLANT
GLOVE BIOGEL PI IND STRL 8 (GLOVE) ×1 IMPLANT
GOWN STRL REUS W/ TWL LRG LVL3 (GOWN DISPOSABLE) ×1 IMPLANT
GOWN STRL REUS W/ TWL XL LVL3 (GOWN DISPOSABLE) IMPLANT
GOWN STRL REUS W/TWL LRG LVL3 (GOWN DISPOSABLE) ×1
GOWN STRL REUS W/TWL XL LVL3 (GOWN DISPOSABLE)
HANDPIECE INTERPULSE COAX TIP (DISPOSABLE) ×1
HOLDER FOLEY CATH W/STRAP (MISCELLANEOUS) IMPLANT
IMMOBILIZER KNEE 20 (SOFTGOODS) ×1
IMMOBILIZER KNEE 20 THIGH 36 (SOFTGOODS) ×1 IMPLANT
KIT TURNOVER KIT A (KITS) IMPLANT
MANIFOLD NEPTUNE II (INSTRUMENTS) ×1 IMPLANT
NS IRRIG 1000ML POUR BTL (IV SOLUTION) ×1 IMPLANT
PACK TOTAL KNEE CUSTOM (KITS) ×1 IMPLANT
PADDING CAST COTTON 6X4 STRL (CAST SUPPLIES) ×2 IMPLANT
PIN STEINMAN FIXATION KNEE (PIN) IMPLANT
PROTECTOR NERVE ULNAR (MISCELLANEOUS) ×1 IMPLANT
SET HNDPC FAN SPRY TIP SCT (DISPOSABLE) ×1 IMPLANT
SPIKE FLUID TRANSFER (MISCELLANEOUS) ×1 IMPLANT
STRIP CLOSURE SKIN 1/2X4 (GAUZE/BANDAGES/DRESSINGS) ×2 IMPLANT
SUT MNCRL AB 4-0 PS2 18 (SUTURE) ×1 IMPLANT
SUT STRATAFIX 0 PDS 27 VIOLET (SUTURE) ×1
SUT VIC AB 2-0 CT1 27 (SUTURE) ×3
SUT VIC AB 2-0 CT1 TAPERPNT 27 (SUTURE) ×3 IMPLANT
SUTURE STRATFX 0 PDS 27 VIOLET (SUTURE) ×1 IMPLANT
TIBIAL BASE ROT PLAT SZ 7 KNEE (Knees) ×1 IMPLANT
TRAY FOLEY MTR SLVR 16FR STAT (SET/KITS/TRAYS/PACK) ×1 IMPLANT
TUBE SUCTION HIGH CAP CLEAR NV (SUCTIONS) ×1 IMPLANT
WATER STERILE IRR 1000ML POUR (IV SOLUTION) ×2 IMPLANT
WRAP KNEE MAXI GEL POST OP (GAUZE/BANDAGES/DRESSINGS) ×1 IMPLANT

## 2022-04-24 NOTE — Plan of Care (Signed)
  Problem: Activity: Goal: Ability to avoid complications of mobility impairment will improve Outcome: Progressing Goal: Range of joint motion will improve Outcome: Progressing   Problem: Pain Managment: Goal: General experience of comfort will improve Outcome: Progressing   Problem: Safety: Goal: Ability to remain free from injury will improve Outcome: Progressing

## 2022-04-24 NOTE — Anesthesia Procedure Notes (Signed)
Anesthesia Regional Block: Adductor canal block   Pre-Anesthetic Checklist: , timeout performed,  Correct Patient, Correct Site, Correct Laterality,  Correct Procedure, Correct Position, site marked,  Risks and benefits discussed,  Surgical consent,  Pre-op evaluation,  At surgeon's request and post-op pain management  Laterality: Right  Prep: chloraprep       Needles:  Injection technique: Single-shot  Needle Type: Echogenic Needle     Needle Length: 9cm  Needle Gauge: 21     Additional Needles:   Narrative:  Start time: 04/24/2022 10:42 AM End time: 04/24/2022 10:50 AM Injection made incrementally with aspirations every 5 mL.  Performed by: Personally  Anesthesiologist: Albertha Ghee, MD  Additional Notes: Pt tolerated the procedure well.

## 2022-04-24 NOTE — Anesthesia Procedure Notes (Addendum)
Spinal  Patient location during procedure: OR Start time: 04/24/2022 11:16 AM End time: 04/24/2022 11:19 AM Reason for block: surgical anesthesia Staffing Performed: resident/CRNA  Resident/CRNA: [place] Rodney Wigger C, CRNA Performed by: [place] Manus Rudd, CRNA Authorized by: Albertha Ghee, MD   Preanesthetic Checklist Completed: patient identified, IV checked, site marked, risks and benefits discussed, surgical consent, monitors and equipment checked, pre-op evaluation and timeout performed Spinal Block Patient position: sitting Prep: DuraPrep and site prepped and draped Patient monitoring: heart rate, cardiac monitor, continuous pulse ox and blood pressure Approach: midline Location: L3-4 Injection technique: single-shot Needle Needle type: Pencan  Needle gauge: 24 G Needle length: 9 cm Assessment Sensory level: T4 Events: CSF return Additional Notes IV functioning, monitors applied to pt. Expiration date of kit checked and confirmed to be in date. Sterile prep and drape, hand hygiene and sterile gloved used. Pt was positioned and spine was prepped in sterile fashion. Skin was anesthetized with lidocaine. Free flow of clear CSF obtained prior to injecting local anesthetic into CSF x 1 attempt. Spinal needle aspirated freely following injection. Needle was carefully withdrawn, and pt tolerated procedure well. Loss of motor and sensory on exam post injection.

## 2022-04-24 NOTE — Evaluation (Signed)
Physical Therapy Evaluation Patient Details Name: Robert [name] MRN: [id number] DOB: 02-05-1951 Today's Date: 04/24/2022  History of Present Illness  Pt is a 72yo male presenting s/p R-TKA on 04/24/22. PMH: DM, HLD, HTN, OSA, PNA, ASCVD, R-RCR 2011.  Clinical Impression  Robert [name] is a 72 y.o. male POD 0 s/p R-TKA. Patient reports independence with mobility at baseline. Patient is now limited by functional impairments (see PT problem list below) and demonstrated modified independence for bed mobility and min guard for transfers. Patient was able to ambulate 25 feet with RW and min guard level of assist. Patient instructed in exercise to facilitate ROM and circulation to manage edema. Provided incentive spirometer and with Vcs pt able to achieve 1500L. Patient will benefit from continued skilled PT interventions to address impairments and progress towards PLOF. Acute PT will follow to progress mobility and stair training in preparation for safe discharge home.     Recommendations for follow up therapy are one component of a multi-disciplinary discharge planning process, led by the attending physician.  Recommendations may be updated based on patient status, additional functional criteria and insurance authorization.  Follow Up Recommendations Follow physician's recommendations for discharge plan and follow up therapies      Assistance Recommended at Discharge Frequent or constant Supervision/Assistance  Patient can return home with the following  A little help with walking and/or transfers;A little help with bathing/dressing/bathroom;Assistance with cooking/housework;Assist for transportation;Help with stairs or ramp for entrance    Equipment Recommendations Rolling walker (2 wheels)  Recommendations for Other Services       Functional Status Assessment Patient has had a recent decline in their functional status and demonstrates the ability to make significant improvements in function  in a reasonable and predictable amount of time.     Precautions / Restrictions Precautions Precautions: Knee Precaution Booklet Issued: No Precaution Comments: no pillow under the knee Required Braces or Orthoses: Knee Immobilizer - Right Knee Immobilizer - Right: On at all times;Discontinue post op day 2 Restrictions Weight Bearing Restrictions: Yes RLE Weight Bearing: Weight bearing as tolerated      Mobility  Bed Mobility Overal bed mobility: Modified Independent Bed Mobility: Supine to Sit     Supine to sit: Modified independent (Device/Increase time)     General bed mobility comments: Increased time    Transfers Overall transfer level: Needs assistance Equipment used: Rolling walker (2 wheels) Transfers: Sit to/from Stand Sit to Stand: Min guard           General transfer comment: For safety only    Ambulation/Gait Ambulation/Gait assistance: Min guard Gait Distance (Feet): 25 Feet Assistive device: Rolling walker (2 wheels) Gait Pattern/deviations: Step-to pattern Gait velocity: decreased     General Gait Details: Pt ambulated with RW and min guard no physical assist required or overt LOB noted  Stairs            Wheelchair Mobility    Modified Rankin (Stroke Patients Only)       Balance Overall balance assessment: Needs assistance Sitting-balance support: Feet supported, No upper extremity supported Sitting balance-Leahy Scale: Good     Standing balance support: Reliant on assistive device for balance, During functional activity, Bilateral upper extremity supported Standing balance-Leahy Scale: Poor                               Pertinent Vitals/Pain Pain Assessment Pain Assessment: Faces Faces Pain Scale: Hurts a little bit  Pain Location: right knee Pain Descriptors / Indicators: Operative site guarding Pain Intervention(s): Limited activity within patient's tolerance, Monitored during session, Ice applied,  Repositioned    Home Living Family/patient expects to be discharged to:: Private residence Living Arrangements: Spouse/significant other Available Help at Discharge: Family;Available 24 hours/day Type of Home: House Home Access: Stairs to enter Entrance Stairs-Rails: None Entrance Stairs-Number of Steps: 3   Home Layout: One level Home Equipment: None      Prior Function Prior Level of Function : Independent/Modified Independent             Mobility Comments: IND ADLs Comments: IND     Hand Dominance        Extremity/Trunk Assessment   Upper Extremity Assessment Upper Extremity Assessment: Overall WFL for tasks assessed    Lower Extremity Assessment Lower Extremity Assessment: RLE deficits/detail;LLE deficits/detail RLE Deficits / Details: MMT ank DF/PF 5/5 no extensor lag noted RLE Sensation: WNL LLE Deficits / Details: MMT ank DF/PF 5/5 LLE Sensation: WNL    Cervical / Trunk Assessment Cervical / Trunk Assessment: Normal  Communication   Communication: No difficulties  Cognition Arousal/Alertness: Awake/alert Behavior During Therapy: WFL for tasks assessed/performed Overall Cognitive Status: Within Functional Limits for tasks assessed                                          General Comments General comments (skin integrity, edema, etc.): Wife Diane and son Deundre Thong present    Exercises Total Joint Exercises Ankle Circles/Pumps: AROM, Both, 20 reps   Assessment/Plan    PT Assessment Patient needs continued PT services  PT Problem List Decreased strength;Decreased range of motion;Decreased activity tolerance;Decreased balance;Decreased mobility;Pain       PT Treatment Interventions DME instruction;Gait training;Stair training;Functional mobility training;Therapeutic activities;Therapeutic exercise;Balance training;Neuromuscular re-education;Patient/family education    PT Goals (Current goals can be found in the Care Plan  section)  Acute Rehab PT Goals Patient Stated Goal: Golfing PT Goal Formulation: With patient Time For Goal Achievement: 05/01/22 Potential to Achieve Goals: Good    Frequency 7X/week     Co-evaluation               AM-PAC PT "6 Clicks" Mobility  Outcome Measure Help needed turning from your back to your side while in a flat bed without using bedrails?: None Help needed moving from lying on your back to sitting on the side of a flat bed without using bedrails?: A Little Help needed moving to and from a bed to a chair (including a wheelchair)?: A Little Help needed standing up from a chair using your arms (e.g., wheelchair or bedside chair)?: A Little Help needed to walk in hospital room?: A Little Help needed climbing 3-5 steps with a railing? : A Little 6 Click Score: 19    End of Session Equipment Utilized During Treatment: Gait belt;Right knee immobilizer Activity Tolerance: Patient tolerated treatment well;No increased pain Patient left: in chair;with call bell/phone within reach;with chair alarm set;with family/visitor present;with SCD's reapplied Nurse Communication: Mobility status PT Visit Diagnosis: Pain;Difficulty in walking, not elsewhere classified (R26.2) Pain - Right/Left: Right Pain - part of body: Knee    Time: 4098-1191 PT Time Calculation (min) (ACUTE ONLY): 28 min   Charges:   PT Evaluation $PT Eval Low Complexity: 1 Low PT Treatments $Gait Training: 8-22 mins        Coolidge Breeze, PT, DPT  [facility] Department Office: [phone] Weekend pager: [phone]  Coolidge Breeze 04/24/2022, 6:38 PM

## 2022-04-24 NOTE — Plan of Care (Signed)
  Problem: Education: Goal: Knowledge of the prescribed therapeutic regimen will improve Outcome: Progressing   Problem: Activity: Goal: Range of joint motion will improve Outcome: Progressing   Problem: Safety: Goal: Ability to remain free from injury will improve Outcome: Progressing

## 2022-04-24 NOTE — Transfer of Care (Signed)
Immediate Anesthesia Transfer of Care Note  Patient: Robert [name]  Procedure(s) Performed: TOTAL KNEE ARTHROPLASTY (Right: Knee)  Patient Location: PACU  Anesthesia Type:Spinal  Level of Consciousness: awake and confused  Airway & Oxygen Therapy: Patient Spontanous Breathing and Patient connected to face mask oxygen  Post-op Assessment: Report given to RN and Post -op Vital signs reviewed and stable  Post vital signs: Reviewed and stable  Last Vitals:  Vitals Value Taken Time  BP    Temp 36.4 C 04/24/22 1251  Pulse    Resp    SpO2      Last Pain:  Vitals:   04/24/22 0926  TempSrc:   PainSc: 0-No pain      Patients Stated Pain Goal: 4 (06/15/92 5859)  Complications: No notable events documented.

## 2022-04-24 NOTE — Care Plan (Signed)
Ortho Bundle Case Management Note  Patient Details  Name: Robert [name] MRN: [id number] Date of Birth: Aug 25, 1950                  R TKA on 04/24/22.  DCP: Home with wife Diane.  DME: RW ordered through [facility].  PT: Ardeth Sportsman   DME Arranged:  Walker rolling DME Agency:  [employer]    Additional Comments: Please contact me with any questions of if this plan should need to change.   Dario Ave, Case Manager  [facility] [phone] 04/24/2022, 2:40 PM

## 2022-04-24 NOTE — Interval H&P Note (Signed)
History and Physical Interval Note:  04/24/2022 9:19 AM  Robert [name]  has presented today for surgery, with the diagnosis of right knee osteoarthritis.  The various methods of treatment have been discussed with the patient and family. After consideration of risks, benefits and other options for treatment, the patient has consented to  Procedure(s): TOTAL KNEE ARTHROPLASTY (Right) as a surgical intervention.  The patient's history has been reviewed, patient examined, no change in status, stable for surgery.  I have reviewed the patient's chart and labs.  Questions were answered to the patient's satisfaction.     Pilar Plate Cheynne Virden

## 2022-04-24 NOTE — Discharge Instructions (Addendum)
Gaynelle Arabian, MD Total Joint Specialist [facility] [place] [address], [address] [place],  [address] [phone]  TOTAL KNEE REPLACEMENT POSTOPERATIVE DIRECTIONS    Knee Rehabilitation, Guidelines Following Surgery  Results after knee surgery are often greatly improved when you follow the exercise, range of motion and muscle strengthening exercises prescribed by your doctor. Safety measures are also important to protect the knee from further injury. If any of these exercises cause you to have increased pain or swelling in your knee joint, decrease the amount until you are comfortable again and slowly increase them. If you have problems or questions, call your caregiver or physical therapist for advice.   BLOOD CLOT PREVENTION Take a 325 mg Aspirin two times a day for three weeks following surgery. Then resume your normal 81 mg Aspirin. You may resume your vitamins/supplements upon discharge from the hospital. Do not take any NSAIDs (Advil, Aleve, Ibuprofen, Meloxicam, etc.) until you have discontinued the 325 mg Aspirin.  HOME CARE INSTRUCTIONS  Remove items at home which could result in a fall. This includes throw rugs or furniture in walking pathways.  ICE to the affected knee as much as tolerated. Icing helps control swelling. If the swelling is well controlled you will be more comfortable and rehab easier. Continue to use ice on the knee for pain and swelling from surgery. You may notice swelling that will progress down to the foot and ankle. This is normal after surgery. Elevate the leg when you are not up walking on it.    Continue to use the breathing machine which will help keep your temperature down. It is common for your temperature to cycle up and down following surgery, especially at night when you are not up moving around and exerting yourself. The breathing machine keeps your lungs expanded and your temperature down. Do not place pillow under the  operative knee, focus on keeping the knee straight while resting  DIET You may resume your previous home diet once you are discharged from the hospital.  DRESSING / WOUND CARE / SHOWERING Keep your bulky bandage on for 2 days. On the third post-operative day you may remove the Ace bandage and gauze. There is a waterproof adhesive bandage on your skin which will stay in place until your first follow-up appointment. Once you remove this you will not need to place another bandage You may begin showering 3 days following surgery, but do not submerge the incision under water.  ACTIVITY For the first 5 days, the key is rest and control of pain and swelling Do your home exercises twice a day starting on post-operative day 3. On the days you go to physical therapy, just do the home exercises once that day. You should rest, ice and elevate the leg for 50 minutes out of every hour. Get up and walk/stretch for 10 minutes per hour. After 5 days you can increase your activity slowly as tolerated. Walk with your walker as instructed. Use the walker until you are comfortable transitioning to a cane. Walk with the cane in the opposite hand of the operative leg. You may discontinue the cane once you are comfortable and walking steadily. Avoid periods of inactivity such as sitting longer than an hour when not asleep. This helps prevent blood clots.  You may discontinue the knee immobilizer once you are able to perform a straight leg raise while lying down. You may resume a sexual relationship in one month or when given the OK by your doctor.  You may return to work once you are cleared by your doctor.  Do not drive a car for 6 weeks or until released by your surgeon.  Do not drive while taking narcotics.  TED HOSE STOCKINGS Wear the elastic stockings on both legs for three weeks following surgery during the day. You may remove them at night for sleeping.  WEIGHT BEARING Weight bearing as tolerated with assist  device (walker, cane, etc) as directed, use it as long as suggested by your surgeon or therapist, typically at least 4-6 weeks.  POSTOPERATIVE CONSTIPATION PROTOCOL Constipation - defined medically as fewer than three stools per week and severe constipation as less than one stool per week.  One of the most common issues patients have following surgery is constipation.  Even if you have a regular bowel pattern at home, your normal regimen is likely to be disrupted due to multiple reasons following surgery.  Combination of anesthesia, postoperative narcotics, change in appetite and fluid intake all can affect your bowels.  In order to avoid complications following surgery, here are some recommendations in order to help you during your recovery period.  Colace (docusate) - Pick up an over-the-counter form of Colace or another stool softener and take twice a day as long as you are requiring postoperative pain medications.  Take with a full glass of water daily.  If you experience loose stools or diarrhea, hold the colace until you stool forms back up. If your symptoms do not get better within 1 week or if they get worse, check with your doctor. Dulcolax (bisacodyl) - Pick up over-the-counter and take as directed by the product packaging as needed to assist with the movement of your bowels.  Take with a full glass of water.  Use this product as needed if not relieved by Colace only.  MiraLax (polyethylene glycol) - Pick up over-the-counter to have on hand. MiraLax is a solution that will increase the amount of water in your bowels to assist with bowel movements.  Take as directed and can mix with a glass of water, juice, soda, coffee, or tea. Take if you go more than two days without a movement. Do not use MiraLax more than once per day. Call your doctor if you are still constipated or irregular after using this medication for 7 days in a row.  If you continue to have problems with postoperative constipation,  please contact the office for further assistance and recommendations.  If you experience "the worst abdominal pain ever" or develop nausea or vomiting, please contact the office immediatly for further recommendations for treatment.  ITCHING If you experience itching with your medications, try taking only a single pain pill, or even half a pain pill at a time.  You can also use Benadryl over the counter for itching or also to help with sleep.   MEDICATIONS See your medication summary on the "After Visit Summary" that the nursing staff will review with you prior to discharge.  You may have some home medications which will be placed on hold until you complete the course of blood thinner medication.  It is important for you to complete the blood thinner medication as prescribed by your surgeon.  Continue your approved medications as instructed at time of discharge.  PRECAUTIONS If you experience chest pain or shortness of breath - call 911 immediately for transfer to the hospital emergency department.  If you develop a fever greater that 101 F, purulent drainage from wound, increased redness or drainage from  wound, foul odor from the wound/dressing, or calf pain - CONTACT YOUR SURGEON.                                                   FOLLOW-UP APPOINTMENTS Make sure you keep all of your appointments after your operation with your surgeon and caregivers. You should call the office at the above phone number and make an appointment for approximately two weeks after the date of your surgery or on the date instructed by your surgeon outlined in the "After Visit Summary".  RANGE OF MOTION AND STRENGTHENING EXERCISES  Rehabilitation of the knee is important following a knee injury or an operation. After just a few days of immobilization, the muscles of the thigh which control the knee become weakened and shrink (atrophy). Knee exercises are designed to build up the tone and strength of the thigh muscles and to  improve knee motion. Often times heat used for twenty to thirty minutes before working out will loosen up your tissues and help with improving the range of motion but do not use heat for the first two weeks following surgery. These exercises can be done on a training (exercise) mat, on the floor, on a table or on a bed. Use what ever works the best and is most comfortable for you Knee exercises include:  Leg Lifts - While your knee is still immobilized in a splint or cast, you can do straight leg raises. Lift the leg to 60 degrees, hold for 3 sec, and slowly lower the leg. Repeat 10-20 times 2-3 times daily. Perform this exercise against resistance later as your knee gets better.  Quad and Hamstring Sets - Tighten up the muscle on the front of the thigh (Quad) and hold for 5-10 sec. Repeat this 10-20 times hourly. Hamstring sets are done by pushing the foot backward against an object and holding for 5-10 sec. Repeat as with quad sets.  Leg Slides: Lying on your back, slowly slide your foot toward your buttocks, bending your knee up off the floor (only go as far as is comfortable). Then slowly slide your foot back down until your leg is flat on the floor again. Angel Wings: Lying on your back spread your legs to the side as far apart as you can without causing discomfort.  A rehabilitation program following serious knee injuries can speed recovery and prevent re-injury in the future due to weakened muscles. Contact your doctor or a physical therapist for more information on knee rehabilitation.   POST-OPERATIVE OPIOID TAPER INSTRUCTIONS: It is important to wean off of your opioid medication as soon as possible. If you do not need pain medication after your surgery it is ok to stop day one. Opioids include: Codeine, Hydrocodone(Norco, Vicodin), Oxycodone(Percocet, oxycontin) and hydromorphone amongst others.  Long term and even short term use of opiods can cause: Increased pain  response Dependence Constipation Depression Respiratory depression And more.  Withdrawal symptoms can include Flu like symptoms Nausea, vomiting And more Techniques to manage these symptoms Hydrate well Eat regular healthy meals Stay active Use relaxation techniques(deep breathing, meditating, yoga) Do Not substitute Alcohol to help with tapering If you have been on opioids for less than two weeks and do not have pain than it is ok to stop all together.  Plan to wean off of opioids This plan should start  within one week post op of your joint replacement. Maintain the same interval or time between taking each dose and first decrease the dose.  Cut the total daily intake of opioids by one tablet each day Next start to increase the time between doses. The last dose that should be eliminated is the evening dose.   IF YOU ARE TRANSFERRED TO A SKILLED REHAB FACILITY If the patient is transferred to a skilled rehab facility following release from the hospital, a list of the current medications will be sent to the facility for the patient to continue.  When discharged from the skilled rehab facility, please have the facility set up the patient's [place] prior to being released. Also, the skilled facility will be responsible for providing the patient with their medications at time of release from the facility to include their pain medication, the muscle relaxants, and their blood thinner medication. If the patient is still at the rehab facility at time of the two week follow up appointment, the skilled rehab facility will also need to assist the patient in arranging follow up appointment in our office and any transportation needs.  MAKE SURE YOU:  Understand these instructions.  Get help right away if you are not doing well or get worse.   DENTAL ANTIBIOTICS:  In most cases prophylactic antibiotics for Dental procdeures after total joint surgery are not  necessary.  Exceptions are as follows:  1. History of prior total joint infection  2. Severely immunocompromised (Organ Transplant, cancer chemotherapy, Rheumatoid biologic meds such as [place])  3. Poorly controlled diabetes (A1C &gt; 8.0, blood glucose over 200)  If you have one of these conditions, contact your surgeon for an antibiotic prescription, prior to your dental procedure.    Pick up stool softner and laxative for home use following surgery while on pain medications. Do not submerge incision under water. Please use good hand washing techniques while changing dressing each day. May shower starting three days after surgery. Please use a clean towel to pat the incision dry following showers. Continue to use ice for pain and swelling after surgery. Do not use any lotions or creams on the incision until instructed by your surgeon.

## 2022-04-24 NOTE — Op Note (Signed)
OPERATIVE REPORT-TOTAL KNEE ARTHROPLASTY   Pre-operative diagnosis- Osteoarthritis  Right knee(s)  Post-operative diagnosis- Osteoarthritis Right knee(s)  Procedure-  Right  Total Knee Arthroplasty  Surgeon- Dione Plover. Kaarin Pardy, MD  Assistant- Shearon Balo, PA-C   Anesthesia-   Adductor canal block and spinal  EBL- 25 ml   Drains None  Tourniquet time-  Total Tourniquet Time Documented: Thigh (Right) - 37 minutes Total: Thigh (Right) - 37 minutes     Complications- None  Condition-PACU - hemodynamically stable.   Brief Clinical Note  Robert [name] is a 72 y.o. year old male with end stage OA of his right knee with progressively worsening pain and dysfunction. He has constant pain, with activity and at rest and significant functional deficits with difficulties even with ADLs. He has had extensive non-op management including analgesics, injections of cortisone and viscosupplements, and home exercise program, but remains in significant pain with significant dysfunction. Radiographs show bone on bone arthritis medial. He presents now for right Total Knee Arthroplasty.     Procedure in detail---   The patient is brought into the operating room and positioned supine on the operating table. After successful administration of  Adductor canal block and spinal,   a tourniquet is placed high on the  Right thigh(s) and the lower extremity is prepped and draped in the usual sterile fashion. Time out is performed by the operating team and then the  Right lower extremity is wrapped in Esmarch, knee flexed and the tourniquet inflated to 300 mmHg.       A midline incision is made with a ten blade through the subcutaneous tissue to the level of the extensor mechanism. A fresh blade is used to make a medial parapatellar arthrotomy. Soft tissue over the proximal medial tibia is subperiosteally elevated to the joint line with a knife and into the semimembranosus bursa with a Cobb elevator. Soft  tissue over the proximal lateral tibia is elevated with attention being paid to avoiding the patellar tendon on the tibial tubercle. The patella is everted, knee flexed 90 degrees and the ACL and PCL are removed. Findings are bone on bone medial with exposed bone patella and large marginal osteophytes.        The drill is used to create a starting hole in the distal femur and the canal is thoroughly irrigated with sterile saline to remove the fatty contents. The 5 degree Right  valgus alignment guide is placed into the femoral canal and the distal femoral cutting block is pinned to remove 9 mm off the distal femur. Resection is made with an oscillating saw.      The tibia is subluxed forward and the menisci are removed. The extramedullary alignment guide is placed referencing proximally at the medial aspect of the tibial tubercle and distally along the second metatarsal axis and tibial crest. The block is pinned to remove 9m off the more deficient medial  side. Resection is made with an oscillating saw. Size 7is the most appropriate size for the tibia and the proximal tibia is prepared with the modular drill and keel punch for that size.      The femoral sizing guide is placed and size 7 is most appropriate. Rotation is marked off the epicondylar axis and confirmed by creating a rectangular flexion gap at 90 degrees. The size 7 cutting block is pinned in this rotation and the anterior, posterior and chamfer cuts are made with the oscillating saw. The intercondylar block is then placed and that cut  is made.      Trial size 7 tibial component, trial size 7 posterior stabilized femur and a 8  mm posterior stabilized rotating platform insert trial is placed. Full extension is achieved with excellent varus/valgus and anterior/posterior balance throughout full range of motion. The patella is everted and thickness measured to be 24  mm. Free hand resection is taken to 14 mm, a 38 template is placed, lug holes are  drilled, trial patella is placed, and it tracks normally. Osteophytes are removed off the posterior femur with the trial in place. All trials are removed and the cut bone surfaces prepared with pulsatile lavage. Cement is mixed and once ready for implantation, the size 7 tibial implant, size  7 posterior stabilized femoral component, and the size 38 patella are cemented in place and the patella is held with the clamp. The trial insert is placed and the knee held in full extension. The Exparel (20 ml mixed with 60 ml saline) is injected into the extensor mechanism, posterior capsule, medial and lateral gutters and subcutaneous tissues.  All extruded cement is removed and once the cement is hard the permanent 8 mm posterior stabilized rotating platform insert is placed into the tibial tray.      The wound is copiously irrigated with saline solution and the extensor mechanism closed with # 0 Stratofix suture. The tourniquet is released for a total tourniquet time of 37  minutes. Flexion against gravity is 140 degrees and the patella tracks normally. Subcutaneous tissue is closed with 2.0 vicryl and subcuticular with running 4.0 Monocryl. The incision is cleaned and dried and steri-strips and a bulky sterile dressing are applied. The limb is placed into a knee immobilizer and the patient is awakened and transported to recovery in stable condition.      Please note that a surgical assistant was a medical necessity for this procedure in order to perform it in a safe and expeditious manner. Surgical assistant was necessary to retract the ligaments and vital neurovascular structures to prevent injury to them and also necessary for proper positioning of the limb to allow for anatomic placement of the prosthesis.   Dione Plover Nana Vastine, MD    04/24/2022, 12:24 PM

## 2022-04-25 ENCOUNTER — Other Ambulatory Visit (HOSPITAL_COMMUNITY): Payer: Self-pay

## 2022-04-25 DIAGNOSIS — Z96651 Presence of right artificial knee joint: Secondary | ICD-10-CM | POA: Diagnosis not present

## 2022-04-25 DIAGNOSIS — M1711 Unilateral primary osteoarthritis, right knee: Secondary | ICD-10-CM | POA: Diagnosis not present

## 2022-04-25 LAB — CBC
HCT: 37.5 % — ABNORMAL LOW (ref 39.0–52.0)
Hemoglobin: 12.3 g/dL — ABNORMAL LOW (ref 13.0–17.0)
MCH: 30.1 pg (ref 26.0–34.0)
MCHC: 32.8 g/dL (ref 30.0–36.0)
MCV: 91.7 fL (ref 80.0–100.0)
Platelets: 227 10*3/uL (ref 150–400)
RBC: 4.09 MIL/uL — ABNORMAL LOW (ref 4.22–5.81)
RDW: 14 % (ref 11.5–15.5)
WBC: 9.9 10*3/uL (ref 4.0–10.5)
nRBC: 0 % (ref 0.0–0.2)

## 2022-04-25 LAB — GLUCOSE, CAPILLARY: Glucose-Capillary: 109 mg/dL — ABNORMAL HIGH (ref 70–99)

## 2022-04-25 LAB — BASIC METABOLIC PANEL
Anion gap: 8 (ref 5–15)
BUN: 20 mg/dL (ref 8–23)
CO2: 22 mmol/L (ref 22–32)
Calcium: 8.6 mg/dL — ABNORMAL LOW (ref 8.9–10.3)
Chloride: 105 mmol/L (ref 98–111)
Creatinine, Ser: 1.26 mg/dL — ABNORMAL HIGH (ref 0.61–1.24)
GFR, Estimated: >60 mL/min (ref >60)
Glucose, Bld: 140 mg/dL — ABNORMAL HIGH (ref 70–99)
Potassium: 4.6 mmol/L (ref 3.5–5.1)
Sodium: 135 mmol/L (ref 135–145)

## 2022-04-25 MED ORDER — TRAMADOL HCL 50 MG PO TABS
50.0000 mg | ORAL_TABLET | Freq: Four times a day (QID) | ORAL | 0 refills | Status: DC | PRN
  Filled 2022-04-25 (×2): qty 40, 5d supply, fill #0

## 2022-04-25 MED ORDER — ASPIRIN 325 MG PO TBEC
325.0000 mg | DELAYED_RELEASE_TABLET | Freq: Two times a day (BID) | ORAL | 0 refills | Status: AC
  Filled 2022-04-25: qty 40, 20d supply, fill #0

## 2022-04-25 MED ORDER — HYDROMORPHONE HCL 2 MG PO TABS
2.0000 mg | ORAL_TABLET | Freq: Four times a day (QID) | ORAL | 0 refills | Status: DC | PRN
  Filled 2022-04-25: qty 42, 6d supply, fill #0

## 2022-04-25 MED ORDER — METHOCARBAMOL 500 MG PO TABS
500.0000 mg | ORAL_TABLET | Freq: Four times a day (QID) | ORAL | 0 refills | Status: DC | PRN
  Filled 2022-04-25: qty 40, 10d supply, fill #0

## 2022-04-25 NOTE — TOC Transition Note (Signed)
Transition of Care [facility]) - CM/SW Discharge Note  Patient Details  Name: Robert [name] MRN: [id number] Date of Birth: 18-Jul-1950  Transition of Care [facility]) CM/SW Contact:  Sherie Don, LCSW Phone Number: 04/25/2022, 11:37 AM  Clinical Narrative: Patient is expected to discharge home after working with PT. CSW met with patient and spouse to confirm discharge plan and needs. Patient will go home with OPPT at [facility]. Patient will need a rolling walker, which was delivered to patient's room by [employer]. TOC signing off.   Final next level of care: OP Rehab Barriers to Discharge: No Barriers Identified  Patient Goals and CMS Choice CMS Medicare.gov Compare Post Acute Care list provided to:: Patient Choice offered to / list presented to : Patient  Discharge Plan and Services Additional resources added to the After Visit Summary for          DME Arranged: Walker rolling DME Agency: [employer] Representative spoke with at DME Agency: Prearranged in orthopedist's office  Social Determinants of Health (SDOH) Interventions SDOH Screenings   Food Insecurity: No Food Insecurity (04/24/2022)  Housing: Low Risk  (04/24/2022)  Transportation Needs: No Transportation Needs (04/24/2022)  Utilities: Not At Risk (04/24/2022)  Alcohol Screen: Low Risk  (12/03/2020)  Depression (PHQ2-9): Low Risk  (02/17/2022)  Financial Resource Strain: Low Risk  (12/03/2020)  Physical Activity: Inactive (12/03/2020)  Social Connections: Unknown (12/03/2020)  Stress: No Stress Concern Present (12/03/2020)  Tobacco Use: Medium Risk (04/24/2022)   Readmission Risk Interventions     No data to display

## 2022-04-25 NOTE — Progress Notes (Signed)
Physical Therapy Treatment Patient Details Name: Robert [name] MRN: [id number] DOB: September 19, 1950 Today's Date: 04/25/2022   History of Present Illness Pt is a 72yo male presenting s/p R-TKA on 04/24/22. PMH: DM, HLD, HTN, OSA, PNA, ASCVD, R-RCR 2011.    PT Comments    Pt is POD # 1 and is progressing well. He ambulated 120' with RW and supervision, demonstrated stairs to simulate home set up, good pain control, ROM, and quad activation. Pt demonstrates safe gait & transfers in order to return home from PT perspective once discharged by MD.  While in hospital, will continue to benefit from PT for skilled therapy to advance mobility and exercises.      Recommendations for follow up therapy are one component of a multi-disciplinary discharge planning process, led by the attending physician.  Recommendations may be updated based on patient status, additional functional criteria and insurance authorization.  Follow Up Recommendations  Follow physician's recommendations for discharge plan and follow up therapies     Assistance Recommended at Discharge Frequent or constant Supervision/Assistance  Patient can return home with the following A little help with walking and/or transfers;A little help with bathing/dressing/bathroom;Assistance with cooking/housework;Assist for transportation;Help with stairs or ramp for entrance   Equipment Recommendations  Rolling walker (2 wheels)    Recommendations for Other Services       Precautions / Restrictions Precautions Precautions: Knee Required Braces or Orthoses: Knee Immobilizer - Right Knee Immobilizer - Right: Discontinue once straight leg raise with < 10 degree lag Restrictions Weight Bearing Restrictions: No RLE Weight Bearing: Weight bearing as tolerated     Mobility  Bed Mobility Overal bed mobility: Modified Independent Bed Mobility: Supine to Sit     Supine to sit: Modified independent (Device/Increase time)           Transfers Overall transfer level: Needs assistance Equipment used: Rolling walker (2 wheels) Transfers: Sit to/from Stand Sit to Stand: Supervision           General transfer comment: Demonstrated x 2 safely    Ambulation/Gait Ambulation/Gait assistance: Supervision Gait Distance (Feet): 120 Feet Assistive device: Rolling walker (2 wheels) Gait Pattern/deviations: Step-through pattern Gait velocity: decreased     General Gait Details: Ambulated safely with RW with slight decrease in weight shift to R side   Stairs Stairs: Yes Stairs assistance: Min guard Stair Management: Two rails, One rail Left, Step to pattern, Forwards Number of Stairs: 10 General stair comments: up/down 5 steps with bil rails progressing to 5 steps with rail on L; tolerated well; educated on sequencing   Wheelchair Mobility    Modified Rankin (Stroke Patients Only)       Balance Overall balance assessment: Needs assistance Sitting-balance support: Feet supported, No upper extremity supported Sitting balance-Leahy Scale: Good     Standing balance support: No upper extremity supported, Bilateral upper extremity supported Standing balance-Leahy Scale: Fair Standing balance comment: RW to ambulate but able to perform toileting adls without UE support                            Cognition Arousal/Alertness: Awake/alert Behavior During Therapy: WFL for tasks assessed/performed Overall Cognitive Status: Within Functional Limits for tasks assessed                                          Exercises Total Joint Exercises  Ankle Circles/Pumps: AROM, Both, 10 reps, Supine Quad Sets: AROM, Both, 10 reps, Supine Heel Slides: AROM, Right, 5 reps, Seated Hip ABduction/ADduction: AROM, Right, 5 reps, Seated Long Arc Quad: AROM, Right, 10 reps, Seated Knee Flexion: AROM, Right, 10 reps, Seated Goniometric ROM: R knee 5 to 85 degrees    General Comments General  comments (skin integrity, edema, etc.): VSS; wife present; Educated on safe ice use, no pivots, car transfers, resting with leg straight, and TED hose during day. Also, encouraged walking every 1-2 hours during day. Educated on HEP with focus on mobility the first weeks. Discussed doing exercises within pain control and if pain increasing could decreased ROM, reps, and stop exercises as needed. Encouraged to perform quad sets and ankle pumps frequently for blood flow and to promote full knee extension.      Pertinent Vitals/Pain Pain Assessment Pain Assessment: 0-10 Pain Score: 3  Pain Location: right knee Pain Descriptors / Indicators: Operative site guarding Pain Intervention(s): Limited activity within patient's tolerance, Monitored during session, Ice applied    Home Living Family/patient expects to be discharged to:: Private residence Living Arrangements: Spouse/significant other Available Help at Discharge: Family;Available 24 hours/day Type of Home: House                  Prior Function            PT Goals (current goals can now be found in the care plan section) Progress towards PT goals: Progressing toward goals    Frequency    7X/week      PT Plan Current plan remains appropriate    Co-evaluation              AM-PAC PT "6 Clicks" Mobility   Outcome Measure  Help needed turning from your back to your side while in a flat bed without using bedrails?: None Help needed moving from lying on your back to sitting on the side of a flat bed without using bedrails?: None Help needed moving to and from a bed to a chair (including a wheelchair)?: A Little Help needed standing up from a chair using your arms (e.g., wheelchair or bedside chair)?: A Little Help needed to walk in hospital room?: A Little Help needed climbing 3-5 steps with a railing? : A Little 6 Click Score: 20    End of Session Equipment Utilized During Treatment: Gait belt Activity  Tolerance: Patient tolerated treatment well Patient left: in chair;with call bell/phone within reach;with family/visitor present Nurse Communication: Mobility status (cleared therapy, ride present) PT Visit Diagnosis: Pain;Difficulty in walking, not elsewhere classified (R26.2) Pain - Right/Left: Right Pain - part of body: Knee     Time: 1007-1040 PT Time Calculation (min) (ACUTE ONLY): 33 min  Charges:  $Gait Training: 8-22 mins $Therapeutic Exercise: 8-22 mins                     Abran Richard, PT Acute Rehab [facility] [phone]    Karlton Lemon 04/25/2022, 10:55 AM

## 2022-04-25 NOTE — Progress Notes (Signed)
Subjective: 1 Day Post-Op Procedure(s) (LRB): TOTAL KNEE ARTHROPLASTY (Right) Patient seen in rounds by Dr. Wynelle Link. Patient is well, and has had no acute complaints or problems. Denies SOB or chest pain. Denies calf pain. Foley cath to be removed this AM. Patient reports pain as mild. Worked with physical therapy yesterday and ambulated 25'. We will continue physical therapy today.  Objective: Vital signs in last 24 hours: Temp:  [97.5 F (36.4 C)-98.4 F (36.9 C)] 98.4 F (36.9 C) (01/23 0529) Pulse Rate:  [57-93] 74 (01/23 0529) Resp:  [12-24] 17 (01/23 0529) BP: (115-146)/(68-112) 135/83 (01/23 0529) SpO2:  [97 %-100 %] 97 % (01/23 0529) Weight:  [93 kg] 93 kg (01/22 1503)  Intake/Output from previous day:  Intake/Output Summary (Last 24 hours) at 04/25/2022 0720 Last data filed at 04/25/2022 0529 Gross per 24 hour  Intake 2671.25 ml  Output 3300 ml  Net -628.75 ml     Intake/Output this shift: No intake/output data recorded.  Labs: Recent Labs    04/25/22 0401  HGB 12.3*   Recent Labs    04/25/22 0401  WBC 9.9  RBC 4.09*  HCT 37.5*  PLT 227   Recent Labs    04/25/22 0401  NA 135  K 4.6  CL 105  CO2 22  BUN 20  CREATININE 1.26*  GLUCOSE 140*  CALCIUM 8.6*   No results for input(s): "LABPT", "INR" in the last 72 hours.  Exam: General - Patient is Alert and Oriented Extremity - Neurologically intact Neurovascular intact Sensation intact distally Dorsiflexion/Plantar flexion intact Dressing - dressing C/D/I Motor Function - intact, moving foot and toes well on exam.  Past Medical History:  Diagnosis Date   Arteriosclerotic cardiovascular disease (ASCVD)    DES to LAD D1 - 2003 in [place]; failed intervention for a totally obstructed RCA at that time; EF of 45%; 12/2009: Equivocal stress nuclear with good exercise tolerance, negative stress EKG, normal EF with mild mid and distal inferior ischemia   Arthritis    Diabetes mellitus, insulin  dependent (IDDM), controlled 10/25/2012   HBA1C is 6.9 on 10/22/2012, [employer] 04/26/2020   Diabetes mellitus, type II ([place])    Hyperlipidemia    Lipid profile in 09/2011:128, 130, 33, 69   Hypertension    Lab  09/2011: Normal CMet ex G-133   Pneumonia    Sleep apnea    no CPAP, can't tolerate   Tobacco abuse, in remission    30 pack years; quit in 1985    Assessment/Plan: 1 Day Post-Op Procedure(s) (LRB): TOTAL KNEE ARTHROPLASTY (Right) Principal Problem:   OA (osteoarthritis) of knee Active Problems:   Primary osteoarthritis of right knee  Estimated body mass index is 30.28 kg/m as calculated from the following:   Height as of this encounter: '5\' 9"'$  (1.753 m).   Weight as of this encounter: 93 kg. Advance diet Up with therapy D/C IV fluids   Patient's anticipated LOS is less than 2 midnights, meeting these requirements: - Lives within 1 hour of care - Has a competent adult at home to recover with post-op  - NO history of  - Chronic pain requiring opiods  - Coronary Artery Disease  - Heart failure  - Stroke  - DVT/VTE  - Cardiac arrhythmia  - Respiratory Failure/COPD  - Renal failure  - Anemia  - Advanced Liver disease   DVT Prophylaxis - Aspirin Weight bearing as tolerated.  Continue physical therapy. Expected discharge home today pending progress and if meeting patient goals.  Scheduled for OPPT at [facility]. Follow-up in clinic in 2 weeks.  The PDMP database was reviewed today prior to any opioid medications being prescribed to this patient.  R. Jaynie Bream, PA-C Orthopedic Surgery [phone] 04/25/2022, 7:20 AM

## 2022-04-25 NOTE — Discharge Summary (Signed)
Physician Discharge Summary   Patient ID: Robert [name] MRN: [id number] DOB/AGE: 09/27/1950 72 y.o.  Admit date: 04/24/2022 Discharge date: 04/25/2022  Primary Diagnosis: Osteoarthritis right knee    Admission Diagnoses:  Past Medical History:  Diagnosis Date   Arteriosclerotic cardiovascular disease (ASCVD)    DES to LAD D1 - 2003 in [place]; failed intervention for a totally obstructed RCA at that time; EF of 45%; 12/2009: Equivocal stress nuclear with good exercise tolerance, negative stress EKG, normal EF with mild mid and distal inferior ischemia   Arthritis    Diabetes mellitus, insulin dependent (IDDM), controlled 10/25/2012   HBA1C is 6.9 on 10/22/2012, Phreesia 04/26/2020   Diabetes mellitus, type II ([place])    Hyperlipidemia    Lipid profile in 09/2011:128, 130, 33, 69   Hypertension    Lab  09/2011: Normal CMet ex G-133   Pneumonia    Sleep apnea    no CPAP, can't tolerate   Tobacco abuse, in remission    30 pack years; quit in 1985   Discharge Diagnoses:   Principal Problem:   OA (osteoarthritis) of knee Active Problems:   Primary osteoarthritis of right knee  Estimated body mass index is 30.28 kg/m as calculated from the following:   Height as of this encounter: '5\' 9"'$  (1.753 m).   Weight as of this encounter: 93 kg.  Procedure:  Procedure(s) (LRB): TOTAL KNEE ARTHROPLASTY (Right)   Consults: None  HPI: Robert [name] is a 72 y.o. year old male with end stage OA of his right knee with progressively worsening pain and dysfunction. He has constant pain, with activity and at rest and significant functional deficits with difficulties even with ADLs. He has had extensive non-op management including analgesics, injections of cortisone and viscosupplements, and home exercise program, but remains in significant pain with significant dysfunction. Radiographs show bone on bone arthritis medial. He presents now for right Total Knee Arthroplasty  Laboratory Data: Admission  on 04/24/2022, Discharged on 04/25/2022  Component Date Value Ref Range Status   Glucose-Capillary 04/24/2022 136 (H)  70 - 99 mg/dL Final   Glucose reference range applies only to samples taken after fasting for at least 8 hours.   Glucose-Capillary 04/24/2022 78  70 - 99 mg/dL Final   Glucose reference range applies only to samples taken after fasting for at least 8 hours.   Glucose-Capillary 04/24/2022 125 (H)  70 - 99 mg/dL Final   Glucose reference range applies only to samples taken after fasting for at least 8 hours.   WBC 04/25/2022 9.9  4.0 - 10.5 K/uL Final   RBC 04/25/2022 4.09 (L)  4.22 - 5.81 MIL/uL Final   Hemoglobin 04/25/2022 12.3 (L)  13.0 - 17.0 g/dL Final   HCT 04/25/2022 37.5 (L)  39.0 - 52.0 % Final   MCV 04/25/2022 91.7  80.0 - 100.0 fL Final   MCH 04/25/2022 30.1  26.0 - 34.0 pg Final   MCHC 04/25/2022 32.8  30.0 - 36.0 g/dL Final   RDW 04/25/2022 14.0  11.5 - 15.5 % Final   Platelets 04/25/2022 227  150 - 400 K/uL Final   nRBC 04/25/2022 0.0  0.0 - 0.2 % Final   Performed at [facility], [place] [address], [place], [place] [address]   Sodium 04/25/2022 135  135 - 145 mmol/L Final   Potassium 04/25/2022 4.6  3.5 - 5.1 mmol/L Final   Chloride 04/25/2022 105  98 - 111 mmol/L Final   CO2 04/25/2022 22  22 - 32 mmol/L Final   Glucose, Bld 04/25/2022 140 (H)  70 - 99 mg/dL Final   Glucose reference range applies only to samples taken after fasting for at least 8 hours.   BUN 04/25/2022 20  8 - 23 mg/dL Final   Creatinine, Ser 04/25/2022 1.26 (H)  0.61 - 1.24 mg/dL Final   Calcium 04/25/2022 8.6 (L)  8.9 - 10.3 mg/dL Final   GFR, Estimated 04/25/2022 >60  >60 mL/min Final   Comment: (NOTE) Calculated using the CKD-EPI Creatinine Equation (2021)    Anion gap 04/25/2022 8  5 - 15 Final   Performed at [facility], [place] [address], [place], [place] [address]   Glucose-Capillary 04/24/2022 270 (H)  70 - 99 mg/dL Final   Glucose  reference range applies only to samples taken after fasting for at least 8 hours.   Glucose-Capillary 04/25/2022 109 (H)  70 - 99 mg/dL Final   Glucose reference range applies only to samples taken after fasting for at least 8 hours.  Hospital Outpatient Visit on 04/20/2022  Component Date Value Ref Range Status   MRSA, PCR 04/20/2022 NEGATIVE  NEGATIVE Final   Staphylococcus aureus 04/20/2022 NEGATIVE  NEGATIVE Final   Comment: (NOTE) The Xpert SA Assay (FDA approved for NASAL specimens in patients 32 years of age and older), is one component of a comprehensive surveillance program. It is not intended to diagnose infection nor to guide or monitor treatment. Performed at [facility], [place] [address] [place], [place] [address]      X-Rays:No results found.  EKG: Orders placed or performed in visit on 04/18/22   EKG 12-Lead     Hospital Course: Robert [name] is a 72 y.o. who was admitted to [facility] They were brought to the operating room on 04/24/2022 and underwent Procedure(s): TOTAL KNEE ARTHROPLASTY.  Patient tolerated the procedure well and was later transferred to the recovery room and then to the orthopaedic floor for postoperative care. They were given PO and IV analgesics for pain control following their surgery. They were given 24 hours of postoperative antibiotics of  Anti-infectives (From admission, onward)    Start     Dose/Rate Route Frequency Ordered Stop   04/24/22 1800  ceFAZolin (ANCEF) IVPB 2g/100 mL premix        2 g 200 mL/hr over 30 Minutes Intravenous Every 6 hours 04/24/22 1415 04/25/22 0020   04/24/22 0930  ceFAZolin (ANCEF) IVPB 2g/100 mL premix        2 g 200 mL/hr over 30 Minutes Intravenous On call to O.R. 04/24/22 5784 04/24/22 1124      and started on DVT prophylaxis in the form of Aspirin.   PT and OT were ordered for total joint protocol. Discharge planning consulted to help with postop disposition and equipment  needs.  Patient had a good night on the evening of surgery. They started to get up OOB with therapy on POD #0. Pt was seen during rounds and was ready to go home pending progress with therapy. He worked with therapy on POD #1 and was meeting his goals. Pt was discharged to home later that day in stable condition.  Diet: Diabetic diet Activity: WBAT Follow-up: in 2 weeks Disposition: Home Discharged Condition: stable   Discharge Instructions     Call MD / Call 911   Complete by: As directed    If you experience chest pain or shortness of breath, CALL 911 and be transported  to the hospital emergency room.  If you develope a fever above 101 F, pus (white drainage) or increased drainage or redness at the wound, or calf pain, call your surgeon's office.   Change dressing   Complete by: As directed    You may remove the bulky bandage (ACE wrap and gauze) two days after surgery. You will have an adhesive waterproof bandage underneath. Leave this in place until your first follow-up appointment.   Constipation Prevention   Complete by: As directed    Drink plenty of fluids.  Prune juice may be helpful.  You may use a stool softener, such as Colace (over the counter) 100 mg twice a day.  Use MiraLax (over the counter) for constipation as needed.   Diet - low sodium heart healthy   Complete by: As directed    Do not put a pillow under the knee. Place it under the heel.   Complete by: As directed    Driving restrictions   Complete by: As directed    No driving for two weeks   Post-operative opioid taper instructions:   Complete by: As directed    POST-OPERATIVE OPIOID TAPER INSTRUCTIONS: It is important to wean off of your opioid medication as soon as possible. If you do not need pain medication after your surgery it is ok to stop day one. Opioids include: Codeine, Hydrocodone(Norco, Vicodin), Oxycodone(Percocet, oxycontin) and hydromorphone amongst others.  Long term and even short term use of  opiods can cause: Increased pain response Dependence Constipation Depression Respiratory depression And more.  Withdrawal symptoms can include Flu like symptoms Nausea, vomiting And more Techniques to manage these symptoms Hydrate well Eat regular healthy meals Stay active Use relaxation techniques(deep breathing, meditating, yoga) Do Not substitute Alcohol to help with tapering If you have been on opioids for less than two weeks and do not have pain than it is ok to stop all together.  Plan to wean off of opioids This plan should start within one week post op of your joint replacement. Maintain the same interval or time between taking each dose and first decrease the dose.  Cut the total daily intake of opioids by one tablet each day Next start to increase the time between doses. The last dose that should be eliminated is the evening dose.      TED hose   Complete by: As directed    Use stockings (TED hose) for three weeks on both leg(s).  You may remove them at night for sleeping.   Weight bearing as tolerated   Complete by: As directed       Allergies as of 04/25/2022       Reactions   Metformin And Related Other (See Comments)   Stomach upset   Oxycodone    Other reaction(s): Not available   Rosuvastatin Other (See Comments)   Adverse GI symptoms   Codeine    "Did not work" and "made me feel foolish"        Medication List     STOP taking these medications    aspirin 81 MG tablet Replaced by: aspirin EC 325 MG tablet       TAKE these medications    acetaminophen 650 MG CR tablet Commonly known as: TYLENOL Take 650 mg by mouth every 8 (eight) hours as needed for pain.   aspirin EC 325 MG tablet Take 1 tablet (325 mg total) by mouth 2 (two) times daily for 20 days. Then resume one 81 mg aspirin  once a day Replaces: aspirin 81 MG tablet   atorvastatin 40 MG tablet Commonly known as: LIPITOR Take 1 tablet (40 mg total) by mouth daily. What  changed: when to take this   FreeStyle Libre 2 Sensor Misc Use to monitor blood glucose continuously. Change sensor every 14 days.   HYDROmorphone 2 MG tablet Commonly known as: DILAUDID Take 1 - 2 tablets (2 - 4 mg total) by mouth every 6 (six) hours as needed for severe pain.   Lantus SoloStar 100 UNIT/ML Solostar Pen Generic drug: insulin glargine Inject 40 Units into the skin at bedtime.   loratadine 10 MG tablet Commonly known as: CLARITIN Take 1 tablet (10 mg total) by mouth daily.   methocarbamol 500 MG tablet Commonly known as: ROBAXIN Take 1 tablet (500 mg total) by mouth every 6 (six) hours as needed for muscle spasms.   metoprolol succinate 50 MG 24 hr tablet Commonly known as: TOPROL-XL TAKE 1 TABLET BY MOUTH  DAILY WITH OR IMMEDIATELY  AFTER A MEAL   nitroGLYCERIN 0.4 MG SL tablet Commonly known as: NITROSTAT Place 1 tablet (0.4 mg total) under the tongue every 5 (five) minutes as needed for chest pain.   nystatin powder Commonly known as: MYCOSTATIN/NYSTOP Apply 1 Application topically daily as needed (Apply topically between toes).   olmesartan 20 MG tablet Commonly known as: BENICAR Take 1 tablet (20 mg total) by mouth daily. What changed: when to take this   omeprazole 40 MG capsule Commonly known as: PRILOSEC Take 1 capsule (40 mg total) by mouth daily. What changed: when to take this   TechLite Pen Needles 31G X 8 MM Misc Generic drug: Insulin Pen Needle Use as directed once daily   traMADol 50 MG tablet Commonly known as: ULTRAM Take 1 - 2 tablets (50 - 100 mg total) by mouth every 6 (six) hours as needed for moderate pain.   Trulicity 1.5 QZ/3.0QT Sopn Generic drug: Dulaglutide Inject 1.5 mg into the skin once a week.   Vitamin D (Ergocalciferol) 1.25 MG (50000 UNIT) Caps capsule Commonly known as: DRISDOL Take 1 capsule (50,000 Units total) by mouth every 7 (seven) days.               Discharge Care Instructions  (From  admission, onward)           Start     Ordered   04/25/22 0000  Weight bearing as tolerated        04/25/22 0726   04/25/22 0000  Change dressing       Comments: You may remove the bulky bandage (ACE wrap and gauze) two days after surgery. You will have an adhesive waterproof bandage underneath. Leave this in place until your first follow-up appointment.   04/25/22 0726            Follow-up Information     Gaynelle Arabian, MD. Go on 05/09/2022.   Specialty: Orthopedic Surgery Why: You are scheduled for first post op appt on Tuesday February 6 at 3:15pm. Contact information: [address] [place] [address] [phone]                 Signed: R. Jaynie Bream, PA-C Orthopedic Surgery 04/25/2022, 12:29 PM

## 2022-04-25 NOTE — Plan of Care (Signed)
Discharge instructions given to the patient including medications and follow up.  

## 2022-04-26 ENCOUNTER — Encounter (HOSPITAL_COMMUNITY): Payer: Self-pay | Admitting: Orthopedic Surgery

## 2022-04-26 DIAGNOSIS — M25661 Stiffness of right knee, not elsewhere classified: Secondary | ICD-10-CM | POA: Insufficient documentation

## 2022-04-26 NOTE — Anesthesia Postprocedure Evaluation (Signed)
Anesthesia Post Note  Patient: Robert [name]  Procedure(s) Performed: TOTAL KNEE ARTHROPLASTY (Right: Knee)     Patient location during evaluation: PACU Anesthesia Type: MAC, Spinal and Regional Level of consciousness: oriented and awake and alert Pain management: pain level controlled Vital Signs Assessment: post-procedure vital signs reviewed and stable Respiratory status: spontaneous breathing, respiratory function stable and patient connected to nasal cannula oxygen Cardiovascular status: blood pressure returned to baseline and stable Postop Assessment: no headache, no backache and no apparent nausea or vomiting Anesthetic complications: no   No notable events documented.  Last Vitals:  Vitals:   04/25/22 0529 04/25/22 0930  BP: 135/83 120/68  Pulse: 74 85  Resp: 17 18  Temp: 36.9 C 36.9 C  SpO2: 97% 96%    Last Pain:  Vitals:   04/25/22 0830  TempSrc:   PainSc: 2                  Zianne Schubring S

## 2022-04-27 DIAGNOSIS — Z0279 Encounter for issue of other medical certificate: Secondary | ICD-10-CM

## 2022-04-27 DIAGNOSIS — M25661 Stiffness of right knee, not elsewhere classified: Secondary | ICD-10-CM | POA: Diagnosis not present

## 2022-04-27 DIAGNOSIS — R269 Unspecified abnormalities of gait and mobility: Secondary | ICD-10-CM | POA: Diagnosis not present

## 2022-04-27 DIAGNOSIS — M25561 Pain in right knee: Secondary | ICD-10-CM | POA: Diagnosis not present

## 2022-05-01 DIAGNOSIS — M25661 Stiffness of right knee, not elsewhere classified: Secondary | ICD-10-CM | POA: Diagnosis not present

## 2022-05-01 DIAGNOSIS — R269 Unspecified abnormalities of gait and mobility: Secondary | ICD-10-CM | POA: Diagnosis not present

## 2022-05-01 DIAGNOSIS — M25561 Pain in right knee: Secondary | ICD-10-CM | POA: Diagnosis not present

## 2022-05-03 ENCOUNTER — Other Ambulatory Visit: Payer: Self-pay

## 2022-05-03 ENCOUNTER — Other Ambulatory Visit (HOSPITAL_COMMUNITY): Payer: Self-pay

## 2022-05-03 DIAGNOSIS — R269 Unspecified abnormalities of gait and mobility: Secondary | ICD-10-CM | POA: Diagnosis not present

## 2022-05-03 DIAGNOSIS — M25661 Stiffness of right knee, not elsewhere classified: Secondary | ICD-10-CM | POA: Diagnosis not present

## 2022-05-03 DIAGNOSIS — M25561 Pain in right knee: Secondary | ICD-10-CM | POA: Diagnosis not present

## 2022-05-03 MED ORDER — HYDROMORPHONE HCL 2 MG PO TABS
2.0000 mg | ORAL_TABLET | Freq: Four times a day (QID) | ORAL | 0 refills | Status: DC | PRN
  Filled 2022-05-03: qty 42, 6d supply, fill #0

## 2022-05-03 MED ORDER — TRAMADOL HCL 50 MG PO TABS
50.0000 mg | ORAL_TABLET | Freq: Four times a day (QID) | ORAL | 0 refills | Status: DC | PRN
  Filled 2022-05-03: qty 40, 5d supply, fill #0

## 2022-05-08 ENCOUNTER — Ambulatory Visit: Payer: HMO | Admitting: Physician Assistant

## 2022-05-08 DIAGNOSIS — M25561 Pain in right knee: Secondary | ICD-10-CM | POA: Diagnosis not present

## 2022-05-08 DIAGNOSIS — R269 Unspecified abnormalities of gait and mobility: Secondary | ICD-10-CM | POA: Diagnosis not present

## 2022-05-08 DIAGNOSIS — M25661 Stiffness of right knee, not elsewhere classified: Secondary | ICD-10-CM | POA: Diagnosis not present

## 2022-05-10 ENCOUNTER — Other Ambulatory Visit: Payer: Self-pay | Admitting: Family Medicine

## 2022-05-10 DIAGNOSIS — E1169 Type 2 diabetes mellitus with other specified complication: Secondary | ICD-10-CM

## 2022-05-10 DIAGNOSIS — M25661 Stiffness of right knee, not elsewhere classified: Secondary | ICD-10-CM | POA: Diagnosis not present

## 2022-05-10 DIAGNOSIS — R269 Unspecified abnormalities of gait and mobility: Secondary | ICD-10-CM | POA: Diagnosis not present

## 2022-05-10 DIAGNOSIS — M25561 Pain in right knee: Secondary | ICD-10-CM | POA: Diagnosis not present

## 2022-05-11 ENCOUNTER — Other Ambulatory Visit: Payer: Self-pay

## 2022-05-11 ENCOUNTER — Other Ambulatory Visit (HOSPITAL_COMMUNITY): Payer: Self-pay

## 2022-05-11 MED ORDER — FREESTYLE LIBRE 2 SENSOR MISC
5 refills | Status: DC
  Filled 2022-05-11: qty 2, 28d supply, fill #0
  Filled 2022-06-06 (×2): qty 2, 28d supply, fill #1
  Filled 2022-07-04: qty 2, 28d supply, fill #2

## 2022-05-12 DIAGNOSIS — M25661 Stiffness of right knee, not elsewhere classified: Secondary | ICD-10-CM | POA: Diagnosis not present

## 2022-05-12 DIAGNOSIS — M25561 Pain in right knee: Secondary | ICD-10-CM | POA: Diagnosis not present

## 2022-05-12 DIAGNOSIS — R269 Unspecified abnormalities of gait and mobility: Secondary | ICD-10-CM | POA: Diagnosis not present

## 2022-05-15 DIAGNOSIS — M25561 Pain in right knee: Secondary | ICD-10-CM | POA: Diagnosis not present

## 2022-05-15 DIAGNOSIS — R269 Unspecified abnormalities of gait and mobility: Secondary | ICD-10-CM | POA: Diagnosis not present

## 2022-05-15 DIAGNOSIS — M25661 Stiffness of right knee, not elsewhere classified: Secondary | ICD-10-CM | POA: Diagnosis not present

## 2022-05-17 DIAGNOSIS — M25661 Stiffness of right knee, not elsewhere classified: Secondary | ICD-10-CM | POA: Diagnosis not present

## 2022-05-17 DIAGNOSIS — R269 Unspecified abnormalities of gait and mobility: Secondary | ICD-10-CM | POA: Diagnosis not present

## 2022-05-17 DIAGNOSIS — M25561 Pain in right knee: Secondary | ICD-10-CM | POA: Diagnosis not present

## 2022-05-18 ENCOUNTER — Other Ambulatory Visit (HOSPITAL_COMMUNITY): Payer: HMO

## 2022-05-19 DIAGNOSIS — R269 Unspecified abnormalities of gait and mobility: Secondary | ICD-10-CM | POA: Diagnosis not present

## 2022-05-19 DIAGNOSIS — M25661 Stiffness of right knee, not elsewhere classified: Secondary | ICD-10-CM | POA: Diagnosis not present

## 2022-05-19 DIAGNOSIS — M25561 Pain in right knee: Secondary | ICD-10-CM | POA: Diagnosis not present

## 2022-05-22 DIAGNOSIS — M25561 Pain in right knee: Secondary | ICD-10-CM | POA: Diagnosis not present

## 2022-05-22 DIAGNOSIS — M25661 Stiffness of right knee, not elsewhere classified: Secondary | ICD-10-CM | POA: Diagnosis not present

## 2022-05-22 DIAGNOSIS — R269 Unspecified abnormalities of gait and mobility: Secondary | ICD-10-CM | POA: Diagnosis not present

## 2022-05-24 DIAGNOSIS — R269 Unspecified abnormalities of gait and mobility: Secondary | ICD-10-CM | POA: Diagnosis not present

## 2022-05-24 DIAGNOSIS — M25661 Stiffness of right knee, not elsewhere classified: Secondary | ICD-10-CM | POA: Diagnosis not present

## 2022-05-24 DIAGNOSIS — M25561 Pain in right knee: Secondary | ICD-10-CM | POA: Diagnosis not present

## 2022-05-26 DIAGNOSIS — M25561 Pain in right knee: Secondary | ICD-10-CM | POA: Diagnosis not present

## 2022-05-26 DIAGNOSIS — R269 Unspecified abnormalities of gait and mobility: Secondary | ICD-10-CM | POA: Diagnosis not present

## 2022-05-26 DIAGNOSIS — M25661 Stiffness of right knee, not elsewhere classified: Secondary | ICD-10-CM | POA: Diagnosis not present

## 2022-05-29 DIAGNOSIS — R269 Unspecified abnormalities of gait and mobility: Secondary | ICD-10-CM | POA: Diagnosis not present

## 2022-05-29 DIAGNOSIS — M25661 Stiffness of right knee, not elsewhere classified: Secondary | ICD-10-CM | POA: Diagnosis not present

## 2022-05-29 DIAGNOSIS — M25561 Pain in right knee: Secondary | ICD-10-CM | POA: Diagnosis not present

## 2022-05-30 DIAGNOSIS — Z5189 Encounter for other specified aftercare: Secondary | ICD-10-CM | POA: Diagnosis not present

## 2022-06-01 DIAGNOSIS — R269 Unspecified abnormalities of gait and mobility: Secondary | ICD-10-CM | POA: Diagnosis not present

## 2022-06-01 DIAGNOSIS — M25561 Pain in right knee: Secondary | ICD-10-CM | POA: Diagnosis not present

## 2022-06-01 DIAGNOSIS — M25661 Stiffness of right knee, not elsewhere classified: Secondary | ICD-10-CM | POA: Diagnosis not present

## 2022-06-05 NOTE — Progress Notes (Signed)
H&P  Chief Complaint: Elevated PSA  History of Present Illness: 72 yo male sent by Dr Moshe Cipro for E/M of elevated PSA. In 2010, PSA was 0.79--recently up to 4.3 last month.  He has minimal urinary symptoms.  IPSS 7, quality-of-life score 2.  His older brother was diagnosed with and treated for prostate cancer a few years ago.  Past Medical History:  Diagnosis Date   Arteriosclerotic cardiovascular disease (ASCVD)    DES to LAD D1 - 2003 in [place]; failed intervention for a totally obstructed RCA at that time; EF of 45%; 12/2009: Equivocal stress nuclear with good exercise tolerance, negative stress EKG, normal EF with mild mid and distal inferior ischemia   Arthritis    Diabetes mellitus, insulin dependent (IDDM), controlled 10/25/2012   HBA1C is 6.9 on 10/22/2012, Phreesia 04/26/2020   Diabetes mellitus, type II ([place])    Hyperlipidemia    Lipid profile in 09/2011:128, 130, 33, 69   Hypertension    Lab  09/2011: Normal CMet ex G-133   Pneumonia    Sleep apnea    no CPAP, can't tolerate   Tobacco abuse, in remission    30 pack years; quit in 1985    Past Surgical History:  Procedure Laterality Date   BIOPSY  01/05/2022   Procedure: BIOPSY;  Surgeon: Montez Morita, Quillian Quince, MD;  Location: [facility];  Service: Gastroenterology;;   bone spur Left    foot big toe   CARDIAC CATHETERIZATION  2003   DES placed,  done in [place]   COLONOSCOPY  04/03/2008   Negative screening study   COLONOSCOPY N/A 11/11/2014   Procedure: COLONOSCOPY;  Surgeon: Rogene Houston, MD;  Location: [facility];  Service: Endoscopy;  Laterality: N/A;  1030   COLONOSCOPY WITH PROPOFOL N/A 01/05/2022   Procedure: COLONOSCOPY WITH PROPOFOL;  Surgeon: Harvel Quale, MD;  Location: [facility];  Service: Gastroenterology;  Laterality: N/A;  730 ASA 2   EPIDIDYMIS SURGERY  04/03/1994   ESOPHAGOGASTRODUODENOSCOPY (EGD) WITH PROPOFOL N/A 01/05/2022   Procedure: ESOPHAGOGASTRODUODENOSCOPY (EGD)  WITH PROPOFOL;  Surgeon: Harvel Quale, MD;  Location: [facility];  Service: Gastroenterology;  Laterality: N/A;   HERNIA REPAIR     umbilical as a baby   KNEE ARTHROSCOPY W/ MENISCECTOMY  11/01/2008   Right   POLYPECTOMY  01/05/2022   Procedure: POLYPECTOMY;  Surgeon: Harvel Quale, MD;  Location: [facility];  Service: Gastroenterology;;   PRESSURE ULCER DEBRIDEMENT  04/03/2004   Right lower extremity   ROTATOR CUFF REPAIR  07/02/2009   Right   TOTAL KNEE ARTHROPLASTY Right 04/24/2022   Procedure: TOTAL KNEE ARTHROPLASTY;  Surgeon: Gaynelle Arabian, MD;  Location: [facility];  Service: Orthopedics;  Laterality: Right;    Home Medications:  Allergies as of 06/06/2022       Reactions   Metformin And Related Other (See Comments)   Stomach upset   Oxycodone    Other reaction(s): Not available   Rosuvastatin Other (See Comments)   Adverse GI symptoms   Codeine    "Did not work" and "made me feel foolish"        Medication List        Accurate as of June 05, 2022  5:33 PM. If you have any questions, ask your nurse or doctor.          acetaminophen 650 MG CR tablet Commonly known as: TYLENOL Take 650 mg by mouth every 8 (eight) hours as needed for pain.  atorvastatin 40 MG tablet Commonly known as: LIPITOR Take 1 tablet (40 mg total) by mouth daily. What changed: when to take this   FreeStyle Libre 2 Sensor Misc Use to monitor blood glucose continuously. Change sensor every 14 days.   HYDROmorphone 2 MG tablet Commonly known as: DILAUDID Take 1-2 tablets (2-4 mg total) by mouth every 6 (six) hours as needed for severe pain   Lantus SoloStar 100 UNIT/ML Solostar Pen Generic drug: insulin glargine Inject 40 Units into the skin at bedtime.   loratadine 10 MG tablet Commonly known as: CLARITIN Take 1 tablet (10 mg total) by mouth daily.   methocarbamol 500 MG tablet Commonly known as: ROBAXIN Take 1 tablet (500 mg total) by mouth every 6  (six) hours as needed for muscle spasms.   metoprolol succinate 50 MG 24 hr tablet Commonly known as: TOPROL-XL TAKE 1 TABLET BY MOUTH  DAILY WITH OR IMMEDIATELY  AFTER A MEAL   nitroGLYCERIN 0.4 MG SL tablet Commonly known as: NITROSTAT Place 1 tablet (0.4 mg total) under the tongue every 5 (five) minutes as needed for chest pain.   nystatin powder Commonly known as: MYCOSTATIN/NYSTOP Apply 1 Application topically daily as needed (Apply topically between toes).   olmesartan 20 MG tablet Commonly known as: BENICAR Take 1 tablet (20 mg total) by mouth daily. What changed: when to take this   omeprazole 40 MG capsule Commonly known as: PRILOSEC Take 1 capsule (40 mg total) by mouth daily. What changed: when to take this   TechLite Pen Needles 31G X 8 MM Misc Generic drug: Insulin Pen Needle Use as directed once daily   traMADol 50 MG tablet Commonly known as: ULTRAM Take 1-2 tablets (50-100 mg total) by mouth every 6 (six) hours as needed for moderate pain   Trulicity 1.5 0000000 Sopn Generic drug: Dulaglutide Inject 1.5 mg into the skin once a week.   Vitamin D (Ergocalciferol) 1.25 MG (50000 UNIT) Caps capsule Commonly known as: DRISDOL Take 1 capsule (50,000 Units total) by mouth every 7 (seven) days.        Allergies:  Allergies  Allergen Reactions   Metformin And Related Other (See Comments)    Stomach upset   Oxycodone     Other reaction(s): Not available   Rosuvastatin Other (See Comments)    Adverse GI symptoms   Codeine     "Did not work" and "made me feel foolish"    Family History  Problem Relation Age of Onset   Diabetes Sister    Arthritis Brother    Diabetes Brother    Arthritis Mother    Cancer Father        Hypernephroma    Social History:  reports that he quit smoking about 38 years ago. His smoking use included cigarettes. He started smoking about 56 years ago. He has a 30.00 pack-year smoking history. He has never used smokeless  tobacco. He reports that he does not drink alcohol and does not use drugs.  ROS: A complete review of systems was performed.  All systems are negative except for pertinent findings as noted.  Physical Exam:  Vital signs in last 24 hours: There were no vitals taken for this visit. Constitutional:  Alert and oriented, No acute distress Cardiovascular: Regular rate  Respiratory: Normal respiratory effort GI: No inguinal hernias Genitourinary: Phallus uncertain size.  Testicle slightly atrophic.  Normal anal sphincter tone, prostate 40 g, symmetric, nonnodular, nontender. Lymphatic: No lymphadenopathy Neurologic: Grossly intact, no focal deficits Psychiatric:  Normal mood and affect    I have reviewed notes from referring/previous physicians  I have reviewed urinalysis results  IPSS results reviewed  I have reviewed prior PSA results     Impression/Assessment:  Elevated PSA  Family history of prostate cancer Plan:  I have recommended ultrasound and biopsy of his prostate.  Risks and complications discussed.  We will get this scheduled in the near future.

## 2022-06-06 ENCOUNTER — Other Ambulatory Visit: Payer: Self-pay

## 2022-06-06 ENCOUNTER — Ambulatory Visit: Payer: HMO | Admitting: Urology

## 2022-06-06 ENCOUNTER — Other Ambulatory Visit (HOSPITAL_COMMUNITY): Payer: Self-pay

## 2022-06-06 ENCOUNTER — Other Ambulatory Visit: Payer: Self-pay | Admitting: Family Medicine

## 2022-06-06 ENCOUNTER — Encounter: Payer: Self-pay | Admitting: Urology

## 2022-06-06 VITALS — BP 122/68 | HR 79

## 2022-06-06 DIAGNOSIS — Z8042 Family history of malignant neoplasm of prostate: Secondary | ICD-10-CM | POA: Diagnosis not present

## 2022-06-06 DIAGNOSIS — M25561 Pain in right knee: Secondary | ICD-10-CM | POA: Diagnosis not present

## 2022-06-06 DIAGNOSIS — M25661 Stiffness of right knee, not elsewhere classified: Secondary | ICD-10-CM | POA: Diagnosis not present

## 2022-06-06 DIAGNOSIS — R269 Unspecified abnormalities of gait and mobility: Secondary | ICD-10-CM | POA: Diagnosis not present

## 2022-06-06 DIAGNOSIS — R972 Elevated prostate specific antigen [PSA]: Secondary | ICD-10-CM | POA: Diagnosis not present

## 2022-06-06 LAB — URINALYSIS, ROUTINE W REFLEX MICROSCOPIC
Bilirubin, UA: NEGATIVE
Glucose, UA: NEGATIVE
Ketones, UA: NEGATIVE
Leukocytes,UA: NEGATIVE
Nitrite, UA: NEGATIVE
RBC, UA: NEGATIVE
Specific Gravity, UA: 1.025 (ref 1.005–1.030)
Urobilinogen, Ur: 0.2 mg/dL (ref 0.2–1.0)
pH, UA: 5 (ref 5.0–7.5)

## 2022-06-06 LAB — MICROSCOPIC EXAMINATION: Bacteria, UA: NONE SEEN

## 2022-06-06 MED ORDER — LEVOFLOXACIN 750 MG PO TABS
750.0000 mg | ORAL_TABLET | Freq: Once | ORAL | 0 refills | Status: AC
  Filled 2022-06-06: qty 1, 1d supply, fill #0

## 2022-06-06 NOTE — Patient Instructions (Addendum)
   Appointment Time: 8:00am Appointment Date: 07/11/2022  Location: [facility]   Prostate Biopsy Instructions  Stop all aspirin or blood thinners (aspirin, plavix, coumadin, warfarin, motrin, ibuprofen, advil, aleve, naproxen, naprosyn) for 7 days prior to the procedure.  If you have any questions about stopping these medications, please contact your primary care physician or cardiologist.  Having a light meal prior to the procedure is recommended.  If you are diabetic or have low blood sugar please bring a small snack or glucose tablet.  A Fleets enema is needed to be purchased over the counter at a local pharmacy and used 2 hours before you scheduled appointment.  This can be purchased over the counter at any pharmacy.  Antibiotics will be administered in the clinic at the time of the procedure and 1 tablet has been sent to your pharmacy. Please take the antibiotic as prescribed.    Please bring someone with you to the procedure to drive you home if you are given a valium to take prior to your procedure.   If you have any questions or concerns, please feel free to call the office at [phone] or send a Mychart message.    Thank you, [facility]

## 2022-06-08 DIAGNOSIS — M25661 Stiffness of right knee, not elsewhere classified: Secondary | ICD-10-CM | POA: Diagnosis not present

## 2022-06-08 DIAGNOSIS — R269 Unspecified abnormalities of gait and mobility: Secondary | ICD-10-CM | POA: Diagnosis not present

## 2022-06-08 DIAGNOSIS — M25561 Pain in right knee: Secondary | ICD-10-CM | POA: Diagnosis not present

## 2022-06-13 DIAGNOSIS — R269 Unspecified abnormalities of gait and mobility: Secondary | ICD-10-CM | POA: Diagnosis not present

## 2022-06-13 DIAGNOSIS — M25661 Stiffness of right knee, not elsewhere classified: Secondary | ICD-10-CM | POA: Diagnosis not present

## 2022-06-13 DIAGNOSIS — M25561 Pain in right knee: Secondary | ICD-10-CM | POA: Diagnosis not present

## 2022-06-16 DIAGNOSIS — M25561 Pain in right knee: Secondary | ICD-10-CM | POA: Diagnosis not present

## 2022-06-16 DIAGNOSIS — M25661 Stiffness of right knee, not elsewhere classified: Secondary | ICD-10-CM | POA: Diagnosis not present

## 2022-06-16 DIAGNOSIS — R269 Unspecified abnormalities of gait and mobility: Secondary | ICD-10-CM | POA: Diagnosis not present

## 2022-06-19 ENCOUNTER — Other Ambulatory Visit (HOSPITAL_COMMUNITY): Payer: Self-pay

## 2022-06-20 DIAGNOSIS — R269 Unspecified abnormalities of gait and mobility: Secondary | ICD-10-CM | POA: Diagnosis not present

## 2022-06-20 DIAGNOSIS — M25561 Pain in right knee: Secondary | ICD-10-CM | POA: Diagnosis not present

## 2022-06-20 DIAGNOSIS — M25661 Stiffness of right knee, not elsewhere classified: Secondary | ICD-10-CM | POA: Diagnosis not present

## 2022-06-22 DIAGNOSIS — E119 Type 2 diabetes mellitus without complications: Secondary | ICD-10-CM | POA: Diagnosis not present

## 2022-06-22 DIAGNOSIS — M25561 Pain in right knee: Secondary | ICD-10-CM | POA: Diagnosis not present

## 2022-06-22 DIAGNOSIS — M25661 Stiffness of right knee, not elsewhere classified: Secondary | ICD-10-CM | POA: Diagnosis not present

## 2022-06-22 DIAGNOSIS — R269 Unspecified abnormalities of gait and mobility: Secondary | ICD-10-CM | POA: Diagnosis not present

## 2022-06-22 LAB — HM DIABETES EYE EXAM

## 2022-06-27 DIAGNOSIS — M25561 Pain in right knee: Secondary | ICD-10-CM | POA: Diagnosis not present

## 2022-06-27 DIAGNOSIS — M25661 Stiffness of right knee, not elsewhere classified: Secondary | ICD-10-CM | POA: Diagnosis not present

## 2022-06-27 DIAGNOSIS — R269 Unspecified abnormalities of gait and mobility: Secondary | ICD-10-CM | POA: Diagnosis not present

## 2022-07-04 ENCOUNTER — Other Ambulatory Visit: Payer: Self-pay | Admitting: Family Medicine

## 2022-07-04 ENCOUNTER — Other Ambulatory Visit: Payer: Self-pay

## 2022-07-04 ENCOUNTER — Other Ambulatory Visit (HOSPITAL_COMMUNITY): Payer: Self-pay

## 2022-07-04 MED ORDER — TECHLITE PEN NEEDLES 31G X 8 MM MISC
0 refills | Status: DC
  Filled 2022-07-04: qty 100, 100d supply, fill #0

## 2022-07-10 NOTE — Progress Notes (Signed)
This man is here today for transrectal ultrasound and biopsy for elevation of his PSA.    Risks, benefits, and some of the potential complications of a transrectal ultrasounds of the prostate (TRUSP) with biopsies were discussed at length with the patient including gross hematuria, blood in the bowel movements, hematospermia, bacteremia, infection, voiding discomfort, urinary retention, fever, chills, sepsis, blood transfusion, death, and others. All questions were answered. Informed consent was obtained. The patient confirmed that he had taken his pre-procedure antibiotic. All anticoagulants were discontinued prior to the procedure. The patient emptied his bladder. He was positioned in a comfortable left lateral decubitus position with hips and knees acutely flexed.  The rectal probe was inserted into the rectum without difficulty. 10cc of 2% Lidocaine without epinephrine was instilled with a spinal needle using ultrasound guidance near the junction of each seminal vesicle and the prostate.  Sequential transverse (axial) scans were made in small increments beginning at the seminal vesicles and ending at the prostatic apex. Sequential longitudinal (saggital) scans were made in small increments beginning at the right lateral prostate and ending at the left lateral prostate. Excellent anatomical imaging was obtained. The peripheral, transitional, and central zones were well-defined. The seminal vesicles were normal.  Prostate volume 43 ml.  There were no hypoechoic areas. 12 needle core biopsies were performed. 1 biopsy each was taken from the following areas:  Right lateral base, right medial base, right lateral mid prostate, right medial mid prostate, right lateral apical prostate, right medial apical prostate, left lateral base, left medial base, left lateral mid prostate, left medial mid prostate, left lateral apical prostate, left medial apical prostate.. Minimal prostatic calcifications were noted.  Excellent biopsy specimens were obtained.  Follow-up rectal examination was unremarkable. The procedure was well-tolerated and without complications. Antibiotic instructions were given. The patient was told that:  For several days:  he should increase his fluid intake and limit strenuous activity  he might have mild discomfort at the base of his penis or in his rectum  he might have blood in his urine or blood in his bowel movements  For 2-3 months:  he might have blood in his ejaculate (semen)  Instructions were given to call the office immedicately for blood clots in the urine or bowel movements, difficulty urinating, inability to urinate, urinary retention, painful or frequent urination, fever, chills, nausea, vomiting, or other illness. The patient stated that he understood these instructions and would comply with them. We told the patient that prostate biopsy pathology reports are usually available within 3-5 working days, unless a pathologic second opinion is required, which may take 7-14 days. We told him to contact us to check on the status of his biopsy if he has not heard from [place] within 7 days. The patient left the ultrasound examination room in stable condition.

## 2022-07-11 ENCOUNTER — Ambulatory Visit (HOSPITAL_COMMUNITY)
Admission: RE | Admit: 2022-07-11 | Discharge: 2022-07-11 | Disposition: A | Discharge status: Home / Self Care | Payer: PPO | Source: Ambulatory Visit | Attending: Urology | Admitting: Urology

## 2022-07-11 ENCOUNTER — Other Ambulatory Visit: Payer: Self-pay | Admitting: Urology

## 2022-07-11 ENCOUNTER — Encounter: Payer: Self-pay | Admitting: Urology

## 2022-07-11 ENCOUNTER — Ambulatory Visit: Payer: PPO | Admitting: Urology

## 2022-07-11 ENCOUNTER — Encounter (HOSPITAL_COMMUNITY): Payer: Self-pay

## 2022-07-11 VITALS — BP 131/66 | HR 90 | Temp 98.0°F | Resp 18

## 2022-07-11 DIAGNOSIS — C61 Malignant neoplasm of prostate: Secondary | ICD-10-CM | POA: Diagnosis not present

## 2022-07-11 DIAGNOSIS — R972 Elevated prostate specific antigen [PSA]: Secondary | ICD-10-CM | POA: Diagnosis not present

## 2022-07-11 MED ORDER — GENTAMICIN SULFATE 40 MG/ML IJ SOLN
160.0000 mg | Freq: Once | INTRAMUSCULAR | Status: AC
  Administered 2022-07-11: 160 mg via INTRAMUSCULAR

## 2022-07-11 MED ORDER — GENTAMICIN SULFATE 40 MG/ML IJ SOLN
INTRAMUSCULAR | Status: AC
  Filled 2022-07-11: qty 4

## 2022-07-11 MED ORDER — LIDOCAINE HCL (PF) 2 % IJ SOLN
10.0000 mL | Freq: Once | INTRAMUSCULAR | Status: AC
  Administered 2022-07-11: 10 mL

## 2022-07-11 MED ORDER — LIDOCAINE HCL (PF) 2 % IJ SOLN
INTRAMUSCULAR | Status: AC
  Filled 2022-07-11: qty 10

## 2022-07-11 NOTE — Progress Notes (Signed)
Box of samples (12 total) taken to lab at this time for processing by Rich from Ultrasound. [phone])

## 2022-07-11 NOTE — Progress Notes (Signed)
PT tolerated prostate biopsy procedure well today. Labs obtained and sent for pathology. PT ambulatory at discharge with no acute distress noted and verbalized understanding of discharge instructions.  

## 2022-07-17 ENCOUNTER — Ambulatory Visit: Payer: PPO | Attending: Cardiology | Admitting: Cardiology

## 2022-07-17 ENCOUNTER — Other Ambulatory Visit: Payer: Self-pay

## 2022-07-17 ENCOUNTER — Other Ambulatory Visit (HOSPITAL_COMMUNITY): Payer: Self-pay

## 2022-07-17 ENCOUNTER — Telehealth: Payer: Self-pay

## 2022-07-17 ENCOUNTER — Encounter: Payer: Self-pay | Admitting: Cardiology

## 2022-07-17 VITALS — BP 134/60 | HR 87 | Ht 68.75 in | Wt 200.8 lb

## 2022-07-17 DIAGNOSIS — I10 Essential (primary) hypertension: Secondary | ICD-10-CM

## 2022-07-17 DIAGNOSIS — E782 Mixed hyperlipidemia: Secondary | ICD-10-CM

## 2022-07-17 DIAGNOSIS — I2510 Atherosclerotic heart disease of native coronary artery without angina pectoris: Secondary | ICD-10-CM

## 2022-07-17 MED ORDER — METOPROLOL SUCCINATE ER 50 MG PO TB24
50.0000 mg | ORAL_TABLET | Freq: Every day | ORAL | 3 refills | Status: DC
  Filled 2022-07-17: qty 90, 90d supply, fill #0
  Filled 2022-10-12: qty 90, 90d supply, fill #1
  Filled 2023-01-08: qty 90, 90d supply, fill #2
  Filled 2023-04-11: qty 90, 90d supply, fill #3

## 2022-07-17 NOTE — Telephone Encounter (Signed)
Patient called and left a vm requesting to obtain his Biopsy results. He advised he could be reached at [phone].  Thank you

## 2022-07-17 NOTE — Progress Notes (Signed)
Clinical Summary Robert [name] is a 72 y.o.male seen today for follow up of the following medical problems.   1. CAD - history of prior stenting as described below -08/2014 exercise nuclear stress with Duke treadmill score of 7 consistent with low risk, small area of inferolateral ischemia. Mild inferior ischemia has been noted previously in 12/2009 stress, from notes he has history of RCA CTO that was not able to be opened during 2003 cath in [place].       - no chest pains, no SOB/DOE - compliant with meds       2. HTN - he is compliant with meds   3. Hyperlipidemia   - 08/2020 TC 120 TG 99 HDL 46 LDL 55 - 11/2021 TC 989 TG 86 HDL 46 LDL 65   4. DM II - followed by Dr Lodema Hong     5. OSA -  mixed compliance with cpap - has not been interested in seeing pulmonary   6. AAA screen - no aneurysm by [place] 08/2010  7. Right knee replacement Jan 2024  Recent prostate biopsy he is awaiting results, PSA was elevated   Past Medical History:  Diagnosis Date   Arteriosclerotic cardiovascular disease (ASCVD)    DES to LAD D1 - 2003 in [place]; failed intervention for a totally obstructed RCA at that time; EF of 45%; 12/2009: Equivocal stress nuclear with good exercise tolerance, negative stress EKG, normal EF with mild mid and distal inferior ischemia   Arthritis    Diabetes mellitus, insulin dependent (IDDM), controlled 10/25/2012   HBA1C is 6.9 on 10/22/2012, [employer] 04/26/2020   Diabetes mellitus, type II    Hyperlipidemia    Lipid profile in 09/2011:128, 130, 33, 69   Hypertension    Lab  09/2011: Normal CMet ex G-133   Pneumonia    Sleep apnea    no CPAP, can't tolerate   Tobacco abuse, in remission    30 pack years; quit in 1985     Allergies  Allergen Reactions   Metformin And Related Other (See Comments)    Stomach upset   Oxycodone     Other reaction(s): Not available   Rosuvastatin Other (See Comments)    Adverse GI symptoms   Codeine     "Did not work" and  "made me feel foolish"     Current Outpatient Medications  Medication Sig Dispense Refill   acetaminophen (TYLENOL) 650 MG CR tablet Take 650 mg by mouth every 8 (eight) hours as needed for pain.     atorvastatin (LIPITOR) 40 MG tablet Take 1 tablet (40 mg total) by mouth daily. (Patient taking differently: Take 40 mg by mouth at bedtime.) 90 tablet 3   Continuous Blood Gluc Sensor (FREESTYLE LIBRE 2 SENSOR) MISC Use to monitor blood glucose continuously. Change sensor every 14 days. 2 each 5   Dulaglutide (TRULICITY) 1.5 MG/0.5ML SOPN Inject 1.5 mg into the skin once a week. 6 mL 3   HYDROmorphone (DILAUDID) 2 MG tablet Take 1-2 tablets (2-4 mg total) by mouth every 6 (six) hours as needed for severe pain (Patient not taking: Reported on 06/06/2022) 42 tablet 0   insulin glargine (LANTUS SOLOSTAR) 100 UNIT/ML Solostar Pen Inject 40 Units into the skin at bedtime. 45 mL 3   Insulin Pen Needle (TECHLITE PEN NEEDLES) 31G X 8 MM MISC Use as directed once daily 100 each 0   loratadine (CLARITIN) 10 MG tablet Take 1 tablet (10 mg total) by mouth daily.  30 tablet 1   methocarbamol (ROBAXIN) 500 MG tablet Take 1 tablet (500 mg total) by mouth every 6 (six) hours as needed for muscle spasms. (Patient not taking: Reported on 06/06/2022) 40 tablet 0   metoprolol succinate (TOPROL-XL) 50 MG 24 hr tablet TAKE 1 TABLET BY MOUTH  DAILY WITH OR IMMEDIATELY  AFTER A MEAL 90 tablet 3   nitroGLYCERIN (NITROSTAT) 0.4 MG SL tablet Place 1 tablet (0.4 mg total) under the tongue every 5 (five) minutes as needed for chest pain. 25 tablet 3   nystatin (MYCOSTATIN/NYSTOP) powder Apply 1 Application topically daily as needed (Apply topically between toes). (Patient not taking: Reported on 06/06/2022)     olmesartan (BENICAR) 20 MG tablet Take 1 tablet (20 mg total) by mouth daily. (Patient taking differently: Take 20 mg by mouth at bedtime.) 90 tablet 3   omeprazole (PRILOSEC) 40 MG capsule Take 1 capsule (40 mg total) by mouth  daily. (Patient taking differently: Take 40 mg by mouth at bedtime.) 90 capsule 3   traMADol (ULTRAM) 50 MG tablet Take 1-2 tablets (50-100 mg total) by mouth every 6 (six) hours as needed for moderate pain 40 tablet 0   Vitamin D, Ergocalciferol, (DRISDOL) 1.25 MG (50000 UNIT) CAPS capsule Take 1 capsule (50,000 Units total) by mouth every 7 (seven) days. 5 capsule 8   No current facility-administered medications for this visit.     Past Surgical History:  Procedure Laterality Date   BIOPSY  01/05/2022   Procedure: BIOPSY;  Surgeon: Marguerita Merles, Reuel Boom, MD;  Location: [facility];  Service: Gastroenterology;;   bone spur Left    foot big toe   CARDIAC CATHETERIZATION  2003   DES placed,  done in [place]   COLONOSCOPY  04/03/2008   Negative screening study   COLONOSCOPY N/A 11/11/2014   Procedure: COLONOSCOPY;  Surgeon: Malissa Hippo, MD;  Location: [facility];  Service: Endoscopy;  Laterality: N/A;  1030   COLONOSCOPY WITH PROPOFOL N/A 01/05/2022   Procedure: COLONOSCOPY WITH PROPOFOL;  Surgeon: Dolores Frame, MD;  Location: [facility];  Service: Gastroenterology;  Laterality: N/A;  730 ASA 2   EPIDIDYMIS SURGERY  04/03/1994   ESOPHAGOGASTRODUODENOSCOPY (EGD) WITH PROPOFOL N/A 01/05/2022   Procedure: ESOPHAGOGASTRODUODENOSCOPY (EGD) WITH PROPOFOL;  Surgeon: Dolores Frame, MD;  [facility]: [facility];  Service: Gastroenterology;  Laterality: N/A;   HERNIA REPAIR     umbilical as a baby   KNEE ARTHROSCOPY W/ MENISCECTOMY  11/01/2008   Right   POLYPECTOMY  01/05/2022   Procedure: POLYPECTOMY;  Surgeon: Dolores Frame, MD;  Location: [facility];  Service: Gastroenterology;;   PRESSURE ULCER DEBRIDEMENT  04/03/2004   Right lower extremity   ROTATOR CUFF REPAIR  07/02/2009   Right   TOTAL KNEE ARTHROPLASTY Right 04/24/2022   Procedure: TOTAL KNEE ARTHROPLASTY;  Surgeon: Ollen Gross, MD;  Location: [facility];  Service: Orthopedics;   Laterality: Right;     Allergies  Allergen Reactions   Metformin And Related Other (See Comments)    Stomach upset   Oxycodone     Other reaction(s): Not available   Rosuvastatin Other (See Comments)    Adverse GI symptoms   Codeine     "Did not work" and "made me feel foolish"      Family History  Problem Relation Age of Onset   Diabetes Sister    Arthritis Brother    Diabetes Brother    Arthritis Mother    Cancer Father  Hypernephroma     Social History Robert [name] reports that he quit smoking about 38 years ago. His smoking use included cigarettes. He started smoking about 56 years ago. He has a 30.00 pack-year smoking history. He has never used smokeless tobacco. Robert [name] reports no history of alcohol use.   Review of Systems CONSTITUTIONAL: No weight loss, fever, chills, weakness or fatigue.  HEENT: Eyes: No visual loss, blurred vision, double vision or yellow sclerae.No hearing loss, sneezing, congestion, runny nose or sore throat.  SKIN: No rash or itching.  CARDIOVASCULAR: per hpi RESPIRATORY: No shortness of breath, cough or sputum.  GASTROINTESTINAL: No anorexia, nausea, vomiting or diarrhea. No abdominal pain or blood.  GENITOURINARY: No burning on urination, no polyuria NEUROLOGICAL: No headache, dizziness, syncope, paralysis, ataxia, numbness or tingling in the extremities. No change in bowel or bladder control.  MUSCULOSKELETAL: No muscle, back pain, joint pain or stiffness.  LYMPHATICS: No enlarged nodes. No history of splenectomy.  PSYCHIATRIC: No history of depression or anxiety.  ENDOCRINOLOGIC: No reports of sweating, cold or heat intolerance. No polyuria or polydipsia.  Marland Kitchen   Physical Examination Today's Vitals   07/17/22 0845  BP: 134/60  Pulse: 87  SpO2: 96%  Weight: 200 lb 12.8 oz (91.1 kg)  Height: 5' 8.75" (1.746 m)   Body mass index is 29.87 kg/m.  Gen: resting comfortably, no acute distress HEENT: no scleral icterus,  pupils equal round and reactive, no palptable cervical adenopathy,  CV: RRR, no m/r,g no jvd Resp: Clear to auscultation bilaterally GI: abdomen is soft, non-tender, non-distended, normal bowel sounds, no hepatosplenomegaly MSK: extremities are warm, no edema.  Skin: warm, no rash Neuro:  no focal deficits Psych: appropriate affect   Diagnostic Studies 08/2014 Exercise Nuclear stress Low risk Duke treadmill score of 7. Blood pressure demonstrated a hypertensive response to exercise. There was no ST segment deviation noted during stress. There is a small defect of mild severity present in the basal inferolateral, mid inferolateral and apical inferior location. The defect is reversible. Consistent with mild, predominantly mid to basal inferolateral ischemia. Myocardial perfusion is abnormal. This is a low risk study.    Assessment and Plan  1. CAD -no recent symptoms, continue current meds   2. HTN - at goal, continue current meds     3. Hyperlipidemia -he is at goal, continue current meds   F/u 6 months      Antoine Poche, M.D.

## 2022-07-17 NOTE — Patient Instructions (Signed)
Medication Instructions:  Your physician recommends that you continue on your current medications as directed. Please refer to the Current Medication list given to you today.  *If you need a refill on your cardiac medications before your next appointment, please call your pharmacy*   Lab Work: None If you have labs (blood work) drawn today and your tests are completely normal, you will receive your results only by: MyChart Message (if you have MyChart) OR A paper copy in the mail If you have any lab test that is abnormal or we need to change your treatment, we will call you to review the results.   Testing/Procedures: None   Follow-Up: At [facility], you and your health needs are our priority.  As part of our continuing mission to provide you with exceptional heart care, we have created designated Provider Care Teams.  These Care Teams include your primary Cardiologist (physician) and Advanced Practice Providers (APPs -  Physician Assistants and Nurse Practitioners) who all work together to provide you with the care you need, when you need it.  We recommend signing up for the patient portal called "MyChart".  Sign up information is provided on this After Visit Summary.  MyChart is used to connect with patients for Virtual Visits (Telemedicine).  Patients are able to view lab/test results, encounter notes, upcoming appointments, etc.  Non-urgent messages can be sent to your provider as well.   To learn more about what you can do with MyChart, go to [email or web address].    Your next appointment:   6 month(s)  Provider:   Jonathan Branch, MD    Other Instructions    

## 2022-07-18 NOTE — Telephone Encounter (Signed)
Patient called requesting biopsy results. 

## 2022-07-20 DIAGNOSIS — E1169 Type 2 diabetes mellitus with other specified complication: Secondary | ICD-10-CM | POA: Diagnosis not present

## 2022-07-20 DIAGNOSIS — Z794 Long term (current) use of insulin: Secondary | ICD-10-CM | POA: Diagnosis not present

## 2022-07-21 ENCOUNTER — Encounter: Payer: Self-pay | Admitting: Family Medicine

## 2022-07-21 ENCOUNTER — Ambulatory Visit (INDEPENDENT_AMBULATORY_CARE_PROVIDER_SITE_OTHER): Payer: PPO | Admitting: Family Medicine

## 2022-07-21 VITALS — BP 138/74 | HR 67 | Resp 16 | Ht 68.75 in | Wt 197.0 lb

## 2022-07-21 DIAGNOSIS — E785 Hyperlipidemia, unspecified: Secondary | ICD-10-CM | POA: Diagnosis not present

## 2022-07-21 DIAGNOSIS — C61 Malignant neoplasm of prostate: Secondary | ICD-10-CM

## 2022-07-21 DIAGNOSIS — E1159 Type 2 diabetes mellitus with other circulatory complications: Secondary | ICD-10-CM

## 2022-07-21 DIAGNOSIS — E663 Overweight: Secondary | ICD-10-CM | POA: Diagnosis not present

## 2022-07-21 DIAGNOSIS — G4730 Sleep apnea, unspecified: Secondary | ICD-10-CM | POA: Diagnosis not present

## 2022-07-21 DIAGNOSIS — I10 Essential (primary) hypertension: Secondary | ICD-10-CM

## 2022-07-21 LAB — CMP14+EGFR
ALT: 19 IU/L (ref 0–44)
AST: 19 IU/L (ref 0–40)
Albumin/Globulin Ratio: 1.4 (ref 1.2–2.2)
Albumin: 4.1 g/dL (ref 3.9–4.9)
Alkaline Phosphatase: 131 IU/L — ABNORMAL HIGH (ref 44–121)
BUN/Creatinine Ratio: 11 (ref 10–24)
BUN: 13 mg/dL (ref 8–27)
Bilirubin Total: 0.4 mg/dL (ref 0.0–1.2)
CO2: 23 mmol/L (ref 20–29)
Calcium: 9.3 mg/dL (ref 8.6–10.2)
Chloride: 105 mmol/L (ref 96–106)
Creatinine, Ser: 1.16 mg/dL (ref 0.76–1.27)
Globulin, Total: 2.9 g/dL (ref 1.5–4.5)
Glucose: 97 mg/dL (ref 70–99)
Potassium: 4.6 mmol/L (ref 3.5–5.2)
Sodium: 142 mmol/L (ref 134–144)
Total Protein: 7 g/dL (ref 6.0–8.5)
eGFR: 68 mL/min/{1.73_m2} (ref >59)

## 2022-07-21 LAB — HEMOGLOBIN A1C
Est. average glucose Bld gHb Est-mCnc: 146 mg/dL
Hgb A1c MFr Bld: 6.7 % — ABNORMAL HIGH (ref 4.8–5.6)

## 2022-07-21 NOTE — Patient Instructions (Addendum)
F/U in 4 months, call if you need me sooner  CONGRATS on excellent labs  Need Covid vaccine  Need TdAP vaccine  Fasting lipid, cmp and EGFr, hBa1C 3to 5 days before next visit   You are referred to [facility] a face mask in airport/ plane is a good idea  Thanks for choosing [facility], we consider it a privelige to serve you.

## 2022-07-24 ENCOUNTER — Encounter: Payer: Self-pay | Admitting: Family Medicine

## 2022-07-24 DIAGNOSIS — C61 Malignant neoplasm of prostate: Secondary | ICD-10-CM | POA: Insufficient documentation

## 2022-07-24 NOTE — Assessment & Plan Note (Addendum)
Controlled, no change in medication DASH diet and commitment to daily physical activity for a minimum of 30 minutes discussed and encouraged, as a part of hypertension management. The importance of attaining a healthy weight is also discussed.     07/21/2022   10:47 AM 07/17/2022    8:45 AM 07/11/2022    8:15 AM 06/06/2022   10:05 AM 06/06/2022    9:28 AM 04/25/2022    9:30 AM 04/25/2022    5:29 AM  BP/Weight  Systolic BP 138 134 131 122 143 120 135  Diastolic BP 74 60 66 68 72 68 83  Wt. (Lbs) 197 200.8       BMI 29.3 kg/m2 29.87 kg/m2

## 2022-07-24 NOTE — Assessment & Plan Note (Signed)
Mr. Hassey is reminded of the importance of commitment to daily physical activity for 30 minutes or more, as able and the need to limit carbohydrate intake to 30 to 60 grams per meal to help with blood sugar control.   The need to take medication as prescribed, test blood sugar as directed, and to call between visits if there is a concern that blood sugar is uncontrolled is also discussed.   Mr. Overby is reminded of the importance of daily foot exam, annual eye examination, and good blood sugar, blood pressure and cholesterol control.     Latest Ref Rng & Units 07/20/2022    9:24 AM 04/25/2022    4:01 AM 04/04/2022   10:30 AM 02/17/2022    1:06 PM 11/15/2021   11:02 AM  Diabetic Labs  HbA1c 4.8 - 5.6 % 6.7   7.0   6.7   Micro/Creat Ratio 0 - 29 mg/g creat    33    Chol 100 - 199 mg/dL     161   HDL >09 mg/dL     46   Calc LDL 0 - 99 mg/dL     65   Triglycerides 0 - 149 mg/dL     86   Creatinine 6.04 - 1.27 mg/dL 5.40  9.81  1.91   4.78       07/21/2022   10:47 AM 07/17/2022    8:45 AM 07/11/2022    8:15 AM 06/06/2022   10:05 AM 06/06/2022    9:28 AM 04/25/2022    9:30 AM 04/25/2022    5:29 AM  BP/Weight  Systolic BP 138 134 131 122 143 120 135  Diastolic BP 74 60 66 68 72 68 83  Wt. (Lbs) 197 200.8       BMI 29.3 kg/m2 29.87 kg/m2           Latest Ref Rng & Units 06/22/2022   12:00 AM 06/20/2021   12:00 AM  Foot/eye exam completion dates  Eye Exam No Retinopathy No Retinopathy     No Retinopathy         This result is from an external source.      Controlled, no change in medication

## 2022-07-24 NOTE — Progress Notes (Signed)
Robert [name]     MRN: [id number]      DOB: 07-08-1950   HPI Robert [name] is here for follow up and re-evaluation of chronic medical conditions, medication management and review of any available recent lab and radiology data.  Preventive health is updated, specifically  Cancer screening and Immunization.   Questions or concerns regarding consultations or procedures which the PT has had in the interim are  addressed. The PT denies any adverse reactions to current medications since the last visit.  New diagnosis of prostate cancer, localized, however still processing the diagnosis and experiencing mild anxiety and depression, tough he has been told overall prognosis is good Denies polyuria, polydipsia, blurred vision , or hypoglycemic episodes. 'ROS Denies recent fever or chills. Denies sinus pressure, nasal congestion, ear pain or sore throat. Denies chest congestion, productive cough or wheezing. Denies chest pains, palpitations and leg swelling Denies abdominal pain, nausea, vomiting,diarrhea or constipation.   Denies dysuria, frequency, hesitancy or incontinence. Denies joint pain, swelling and limitation in mobility. Denies headaches, seizures, numbness, or tingling. Denies depression, anxiety or insomnia. Denies skin break down or rash.   PE  BP 138/74   Pulse 67   Resp 16   Ht 5' 8.75" (1.746 m)   Wt 197 lb (89.4 kg)   SpO2 96%   BMI 29.30 kg/m   Patient alert and oriented and in no cardiopulmonary distress.  HEENT: No facial asymmetry, EOMI,     Neck supple .  Chest: Clear to auscultation bilaterally.  CVS: S1, S2 no murmurs, no S3.Regular rate.  ABD: Soft non tender.   Ext: No edema  MS: Adequate ROM spine, shoulders, hips and knees.  Skin: Intact, no ulcerations or rash noted.  Psych: Good eye contact, normal affect. Memory intact mildly anxious and  depressed appearing.  CNS: CN 2-12 intact, power,  normal throughout.no focal deficits  noted.   Assessment & Plan  Essential hypertension Controlled, no change in medication DASH diet and commitment to daily physical activity for a minimum of 30 minutes discussed and encouraged, as a part of hypertension management. The importance of attaining a healthy weight is also discussed.     07/21/2022   10:47 AM 07/17/2022    8:45 AM 07/11/2022    8:15 AM 06/06/2022   10:05 AM 06/06/2022    9:28 AM 04/25/2022    9:30 AM 04/25/2022    5:29 AM  BP/Weight  Systolic BP 138 134 131 122 143 120 135  Diastolic BP 74 60 66 68 72 68 83  Wt. (Lbs) 197 200.8       BMI 29.3 kg/m2 29.87 kg/m2            Type 2 diabetes mellitus with vascular disease [facility]) Robert [name] is reminded of the importance of commitment to daily physical activity for 30 minutes or more, as able and the need to limit carbohydrate intake to 30 to 60 grams per meal to help with blood sugar control.   The need to take medication as prescribed, test blood sugar as directed, and to call between visits if there is a concern that blood sugar is uncontrolled is also discussed.   Robert [name] is reminded of the importance of daily foot exam, annual eye examination, and good blood sugar, blood pressure and cholesterol control.     Latest Ref Rng & Units 07/20/2022    9:24 AM 04/25/2022    4:01 AM 04/04/2022   10:30 AM 02/17/2022  1:06 PM 11/15/2021   11:02 AM  Diabetic Labs  HbA1c 4.8 - 5.6 % 6.7   7.0   6.7   Micro/Creat Ratio 0 - 29 mg/g creat    33    Chol 100 - 199 mg/dL     161   HDL >09 mg/dL     46   Calc LDL 0 - 99 mg/dL     65   Triglycerides 0 - 149 mg/dL     86   Creatinine 6.04 - 1.27 mg/dL 5.40  9.81  1.91   4.78       07/21/2022   10:47 AM 07/17/2022    8:45 AM 07/11/2022    8:15 AM 06/06/2022   10:05 AM 06/06/2022    9:28 AM 04/25/2022    9:30 AM 04/25/2022    5:29 AM  BP/Weight  Systolic BP 138 134 131 122 143 120 135  Diastolic BP 74 60 66 68 72 68 83  Wt. (Lbs) 197 200.8       BMI 29.3 kg/m2 29.87 kg/m2            Latest Ref Rng & Units 06/22/2022   12:00 AM 06/20/2021   12:00 AM  Foot/eye exam completion dates  Eye Exam No Retinopathy No Retinopathy     No Retinopathy         This result is from an external source.      Controlled, no change in medication   Overweight (BMI 25.0-29.9)  Patient re-educated about  the importance of commitment to a  minimum of 150 minutes of exercise per week as able.  The importance of healthy food choices with portion control discussed, as well as eating regularly and within a 12 hour window most days. The need to choose "clean , green" food 50 to 75% of the time is discussed, as well as to make water the primary drink and set a goal of 64 ounces water daily.       07/21/2022   10:47 AM 07/17/2022    8:45 AM 04/24/2022    3:03 PM  Weight /BMI  Weight 197 lb 200 lb 12.8 oz 205 lb 0.4 oz  Height 5' 8.75" (1.746 m) 5' 8.75" (1.746 m)  (1.753 m)  BMI 29.3 kg/m2 29.87 kg/m2 30.28 kg/m2    improving  Sleep apnea in adult Reports being formally dx with OSa, and intolerant of mask, understands the  need for treatment, and htat there are new options, refer for e/m by pulmonary

## 2022-07-24 NOTE — Assessment & Plan Note (Signed)
Reports being formally dx with OSa, and intolerant of mask, understands the  need for treatment, and htat there are new options, refer for e/m by pulmonary

## 2022-07-24 NOTE — Assessment & Plan Note (Signed)
  Patient re-educated about  the importance of commitment to a  minimum of 150 minutes of exercise per week as able.  The importance of healthy food choices with portion control discussed, as well as eating regularly and within a 12 hour window most days. The need to choose "clean , green" food 50 to 75% of the time is discussed, as well as to make water the primary drink and set a goal of 64 ounces water daily.       07/21/2022   10:47 AM 07/17/2022    8:45 AM 04/24/2022    3:03 PM  Weight /BMI  Weight 197 lb 200 lb 12.8 oz 205 lb 0.4 oz  Height 5' 8.75" (1.746 m) 5' 8.75" (1.746 m)  (1.753 m)  BMI 29.3 kg/m2 29.87 kg/m2 30.28 kg/m2    improving

## 2022-07-25 ENCOUNTER — Ambulatory Visit (INDEPENDENT_AMBULATORY_CARE_PROVIDER_SITE_OTHER): Payer: PPO | Admitting: Podiatry

## 2022-07-25 ENCOUNTER — Encounter: Payer: Self-pay | Admitting: Podiatry

## 2022-07-25 VITALS — BP 120/68

## 2022-07-25 DIAGNOSIS — B351 Tinea unguium: Secondary | ICD-10-CM

## 2022-07-25 DIAGNOSIS — E1151 Type 2 diabetes mellitus with diabetic peripheral angiopathy without gangrene: Secondary | ICD-10-CM

## 2022-07-25 DIAGNOSIS — L84 Corns and callosities: Secondary | ICD-10-CM

## 2022-07-25 DIAGNOSIS — M79674 Pain in right toe(s): Secondary | ICD-10-CM

## 2022-07-25 DIAGNOSIS — Q828 Other specified congenital malformations of skin: Secondary | ICD-10-CM

## 2022-07-25 DIAGNOSIS — M79675 Pain in left toe(s): Secondary | ICD-10-CM

## 2022-07-25 NOTE — Progress Notes (Signed)
  Subjective:  Patient ID: Robert [name], male    DOB: 11-Jan-1951,  MRN: [id number]  [name] presents to clinic today for  Chief Complaint  Patient presents with   Plantar lesions and nail care.    Rm 16 Rfc bilateral nail trim.    Patient states he has been diagnosed with prostate cancer.  PCP is Kerri Perches, MD.  Allergies  Allergen Reactions   Metformin And Related Other (See Comments)    Stomach upset   Oxycodone     Other reaction(s): Not available   Rosuvastatin Other (See Comments)    Adverse GI symptoms   Codeine     "Did not work" and "made me feel foolish"   Review of Systems: Negative except as noted in the HPI.  Objective: [name] is a pleasant 72 y.o. AAM, WD, WN in NAD. AAO x 3. Vitals:   07/25/22 0839 07/25/22 0849  BP: (!) 145/67 120/68   Vascular Examination: CFT <3 seconds b/l. DP/PT pulses faintly palpable b/l. Skin temperature gradient warm to warm b/l. No pain with calf compression. No ischemia or gangrene. No cyanosis or clubbing noted b/l. No edema noted b/l LE.   Neurological Examination: Sensation grossly intact b/l with 10 gram monofilament. Vibratory sensation intact b/l.   Dermatological Examination: Pedal skin warm and supple b/l.   No open wounds. No interdigital macerations.  Toenails 1-5 b/l thick, discolored, elongated with subungual debris and pain on dorsal palpation.    Porokeratotic lesion(s) x 5 total plantar aspect of both feet. No erythema, no edema, no drainage, no fluctuance.  Musculoskeletal Examination: Muscle strength 5/5 to b/l LE. Pes planus deformity noted bilateral LE. Patient ambulates independent of any assistive aids.  Radiographs: None  Last A1c:      Latest Ref Rng & Units 07/20/2022    9:24 AM 04/04/2022   10:30 AM 11/15/2021   11:02 AM  Hemoglobin A1C  Hemoglobin-A1c 4.8 - 5.6 % 6.7  7.0  6.7     Assessment:   1. Pain due to onychomycosis of toenails of both feet   2. Porokeratosis    3. Diabetes mellitus type 2 with peripheral artery disease (HCC)    Plan:  Patient was evaluated and treated and all questions answered. Consent given for treatment as described below: -Patient was evaluated and treated. All patient's and/or POA's questions/concerns answered on today's visit. -Patient to continue soft, supportive shoe gear daily. -Toenails 1-5 b/l were debrided in length and girth with sterile nail nippers and dremel without iatrogenic bleeding.  -Porokeratotic lesion(s) both feet pared and enucleated with sterile currette without incident. Total number of lesions debrided=5. -Patient/POA to call should there be question/concern in the interim.  Return in about 3 months (around 10/24/2022).  [name], DPM

## 2022-08-03 ENCOUNTER — Other Ambulatory Visit: Payer: Self-pay

## 2022-08-03 ENCOUNTER — Telehealth: Payer: Self-pay | Admitting: Family Medicine

## 2022-08-03 DIAGNOSIS — Z794 Long term (current) use of insulin: Secondary | ICD-10-CM

## 2022-08-03 MED ORDER — FREESTYLE LIBRE 3 READER DEVI
1.0000 | Freq: Every day | 0 refills | Status: AC
  Filled 2022-08-03: qty 1, 30d supply, fill #0

## 2022-08-03 MED ORDER — FREESTYLE LIBRE 3 SENSOR MISC
3 refills | Status: DC
  Filled 2022-08-03: qty 6, 84d supply, fill #0
  Filled 2022-11-03: qty 6, 84d supply, fill #1
  Filled 2023-01-24: qty 6, 84d supply, fill #2
  Filled 2023-04-18: qty 6, 84d supply, fill #3

## 2022-08-03 NOTE — Telephone Encounter (Signed)
Changed to [place] 3

## 2022-08-03 NOTE — Telephone Encounter (Signed)
Patient called asked to send into his pharmacy wants to updated to the [place] 3 sensor.   Currently has the [place] 2 asking to up to the [place] 3 sensor updated needs to send in a new prescription for 90 supply instead of 30 day supply   Pharmacy   - [facility] [address], [place] [address] Phone: [phone]  Fax: [phone] DEA #: [id number]

## 2022-08-07 ENCOUNTER — Other Ambulatory Visit (HOSPITAL_COMMUNITY): Payer: Self-pay

## 2022-08-07 ENCOUNTER — Other Ambulatory Visit: Payer: Self-pay

## 2022-08-08 ENCOUNTER — Other Ambulatory Visit: Payer: Self-pay

## 2022-08-21 NOTE — Progress Notes (Signed)
History of Present Illness: 72 yo male presents forPCA conference.  4.9.2024: TRUS/Bx. Prostate volume 43 ml, PSA 4.3, PSAD 0.10. 6/10 cores positive 3 cores (Lt mid lat, Lt apex lat, Lt apex medial) revealed GG 1 cancer in 5/10/5 % of cores, respectively 3 cores (Rt base lat, Rt mid medial, Rt apex lat) revealed GG 2 cancer in 20, 10 and 50% of cores respectively  Past Medical History:  Diagnosis Date   Arteriosclerotic cardiovascular disease (ASCVD)    DES to LAD D1 - 2003 in [place]; failed intervention for a totally obstructed RCA at that time; EF of 45%; 12/2009: Equivocal stress nuclear with good exercise tolerance, negative stress EKG, normal EF with mild mid and distal inferior ischemia   Arthritis    Diabetes mellitus, insulin dependent (IDDM), controlled 10/25/2012   HBA1C is 6.9 on 10/22/2012, [employer] 04/26/2020   Diabetes mellitus, type II (HCC)    Hyperlipidemia    Lipid profile in 09/2011:128, 130, 33, 69   Hypertension    Lab  09/2011: Normal CMet ex G-133   Pneumonia    Sleep apnea    no CPAP, can't tolerate   Tobacco abuse, in remission    30 pack years; quit in 1985    Past Surgical History:  Procedure Laterality Date   BIOPSY  01/05/2022   Procedure: BIOPSY;  Surgeon: Marguerita Merles, Reuel Boom, MD;  Location: [facility];  Service: Gastroenterology;;   bone spur Left    foot big toe   CARDIAC CATHETERIZATION  2003   DES placed,  done in [place]   COLONOSCOPY  04/03/2008   Negative screening study   COLONOSCOPY N/A 11/11/2014   Procedure: COLONOSCOPY;  Surgeon: Malissa Hippo, MD;  Location: [facility];  Service: Endoscopy;  Laterality: N/A;  1030   COLONOSCOPY WITH PROPOFOL N/A 01/05/2022   Procedure: COLONOSCOPY WITH PROPOFOL;  Surgeon: Dolores Frame, MD;  Location: [facility];  Service: Gastroenterology;  Laterality: N/A;  730 ASA 2   EPIDIDYMIS SURGERY  04/03/1994   ESOPHAGOGASTRODUODENOSCOPY (EGD) WITH PROPOFOL N/A 01/05/2022    Procedure: ESOPHAGOGASTRODUODENOSCOPY (EGD) WITH PROPOFOL;  Surgeon: Dolores Frame, MD;  Location: [facility];  Service: Gastroenterology;  Laterality: N/A;   HERNIA REPAIR     umbilical as a baby   KNEE ARTHROSCOPY W/ MENISCECTOMY  11/01/2008   Right   POLYPECTOMY  01/05/2022   Procedure: POLYPECTOMY;  Surgeon: Dolores Frame, MD;  Location: [facility];  Service: Gastroenterology;;   PRESSURE ULCER DEBRIDEMENT  04/03/2004   Right lower extremity   ROTATOR CUFF REPAIR  07/02/2009   Right   TOTAL KNEE ARTHROPLASTY Right 04/24/2022   Procedure: TOTAL KNEE ARTHROPLASTY;  Surgeon: Ollen Gross, MD;  Location: [facility];  Service: Orthopedics;  Laterality: Right;    Home Medications:  Allergies as of 08/22/2022       Reactions   Metformin And Related Other (See Comments)   Stomach upset   Oxycodone    Other reaction(s): Not available   Rosuvastatin Other (See Comments)   Adverse GI symptoms   Codeine    "Did not work" and "made me feel foolish"        Medication List        Accurate as of Aug 21, 2022  7:45 PM. If you have any questions, ask your nurse or doctor.          acetaminophen 650 MG CR tablet Commonly known as: TYLENOL Take 650 mg by mouth every  8 (eight) hours as needed for pain.   aspirin EC 81 MG tablet Take 81 mg by mouth daily.   atorvastatin 40 MG tablet Commonly known as: LIPITOR Take 1 tablet (40 mg total) by mouth daily. What changed: when to take this   FreeStyle Libre 2 Sensor Misc Use to monitor blood glucose continuously. Change sensor every 14 days.   FreeStyle Libre 3 Sensor Misc Place 1 sensor on the skin every 14 days. Use to check glucose continuously   FreeStyle [place] 3 Reader [place] Use as directed.   Lantus SoloStar 100 UNIT/ML Solostar Pen Generic drug: insulin glargine Inject 40 Units into the skin at bedtime.   loratadine 10 MG tablet Commonly known as: CLARITIN Take 1 tablet (10 mg total) by  mouth daily.   methocarbamol 500 MG tablet Commonly known as: ROBAXIN Take 1 tablet (500 mg total) by mouth every 6 (six) hours as needed for muscle spasms.   metoprolol succinate 50 MG 24 hr tablet Commonly known as: TOPROL-XL Take 1 tablet (50 mg total) by mouth daily with or immediately following a meal   nitroGLYCERIN 0.4 MG SL tablet Commonly known as: NITROSTAT Place 1 tablet (0.4 mg total) under the tongue every 5 (five) minutes as needed for chest pain.   olmesartan 20 MG tablet Commonly known as: BENICAR Take 1 tablet (20 mg total) by mouth daily. What changed: when to take this   omeprazole 40 MG capsule Commonly known as: PRILOSEC Take 1 capsule (40 mg total) by mouth daily. What changed: when to take this   TechLite Pen Needles 31G X 8 MM Misc Generic drug: Insulin Pen Needle Use as directed once daily   traMADol 50 MG tablet Commonly known as: ULTRAM Take 1-2 tablets (50-100 mg total) by mouth every 6 (six) hours as needed for moderate pain   Trulicity 1.5 MG/0.5ML Sopn Generic drug: Dulaglutide Inject 1.5 mg into the skin once a week.   Vitamin D (Ergocalciferol) 1.25 MG (50000 UNIT) Caps capsule Commonly known as: DRISDOL Take 1 capsule (50,000 Units total) by mouth every 7 (seven) days.        Allergies:  Allergies  Allergen Reactions   Metformin And Related Other (See Comments)    Stomach upset   Oxycodone     Other reaction(s): Not available   Rosuvastatin Other (See Comments)    Adverse GI symptoms   Codeine     "Did not work" and "made me feel foolish"    Family History  Problem Relation Age of Onset   Diabetes Sister    Arthritis Brother    Diabetes Brother    Arthritis Mother    Cancer Father        Hypernephroma    Social History:  reports that he quit smoking about 38 years ago. His smoking use included cigarettes. He started smoking about 56 years ago. He has a 30.00 pack-year smoking history. He has never used smokeless  tobacco. He reports that he does not drink alcohol and does not use drugs.  ROS: A complete review of systems was performed.  All systems are negative except for pertinent findings as noted.  Physical Exam:  Vital signs in last 24 hours: There were no vitals taken for this visit. Constitutional:  Alert and oriented, No acute distress Cardiovascular: Regular rate  Respiratory: Normal respiratory effort Neurologic: Grossly intact, no focal deficits Psychiatric: Normal mood and affect  I have reviewed prior pt notes  I have reviewed urinalysis results  I have independently reviewed prior imaging  I have reviewed prior PSA/path  results    Impression/Assessment:  GG 2 PCA  Plan:  The patient (and family member present) was/were counseled about the natural history of prostate cancer and the standard treatment options that are available for prostate cancer. It was explained to him how his age and life expectancy, clinical stage, Gleason score, and PSA affect his prognosis, the decision to proceed with additional staging studies, as well as how that information influences recommended treatment strategies. We discussed the roles for active surveillance, radiation therapy, surgical therapy, androgen deprivation, as well as ablative therapy options for the treatment of prostate cancer as appropriate to his individual cancer situation. We discussed the risks and benefits of these options with regard to their impact on cancer control and also in terms of potential adverse events, complications, and impact on quality of life particularly related to urinary, bowel, and sexual function. The patient was encouraged to ask questions throughout the discussion today and all questions were answered to his stated satisfaction. In addition, the patient was provided with and/or directed to appropriate resources and literature for further education about prostate cancer and treatment options.  The patient and  his wife would like to have an appointment with Dr. Kathrynn Running to discuss brachytherapy.  We will set that up.

## 2022-08-22 ENCOUNTER — Encounter: Payer: Self-pay | Admitting: Urology

## 2022-08-22 ENCOUNTER — Ambulatory Visit (INDEPENDENT_AMBULATORY_CARE_PROVIDER_SITE_OTHER): Payer: PPO | Admitting: Urology

## 2022-08-22 VITALS — BP 108/68 | HR 88

## 2022-08-22 DIAGNOSIS — N5201 Erectile dysfunction due to arterial insufficiency: Secondary | ICD-10-CM | POA: Diagnosis not present

## 2022-08-22 DIAGNOSIS — C61 Malignant neoplasm of prostate: Secondary | ICD-10-CM

## 2022-08-22 MED ORDER — SILDENAFIL CITRATE 100 MG PO TABS
50.0000 mg | ORAL_TABLET | ORAL | 11 refills | Status: AC | PRN
Start: 2022-08-22 — End: ?
  Filled 2022-08-22: qty 30, 30d supply, fill #0

## 2022-08-23 ENCOUNTER — Other Ambulatory Visit (HOSPITAL_COMMUNITY): Payer: Self-pay

## 2022-08-23 ENCOUNTER — Other Ambulatory Visit: Payer: Self-pay

## 2022-08-31 NOTE — Progress Notes (Signed)
GU Location of Tumor / Histology: Prostate Ca  If Prostate Cancer, Gleason Score is (3 + 4) and PSA is (4.3 on 04/2022)  Biopsies     Past/Anticipated interventions by urology, if any:   Dr. Marcine Matar  Impression/Assessment:  GG 2 PCA   Plan:  The patient (and family member present) was/were counseled about the natural history of prostate cancer and the standard treatment options that are available for prostate cancer. It was explained to him how his age and life expectancy, clinical stage, Gleason score, and PSA affect his prognosis, the decision to proceed with additional staging studies, as well as how that information influences recommended treatment strategies. We discussed the roles for active surveillance, radiation therapy, surgical therapy, androgen deprivation, as well as ablative therapy options for the treatment of prostate cancer as appropriate to his individual cancer situation. We discussed the risks and benefits of these options with regard to their impact on cancer control and also in terms of potential adverse events, complications, and impact on quality of life particularly related to urinary, bowel, and sexual function. The patient was encouraged to ask questions throughout the discussion today and all questions were answered to his stated satisfaction. In addition, the patient was provided with and/or directed to appropriate resources and literature for further education about prostate cancer and treatment options.   The patient and his wife would like to have an appointment with Dr. Kathrynn Running to discuss brachytherapy.  We will set that up.    Past/Anticipated interventions by medical oncology, if any: NA  Weight changes, if any:  No, take meducaktuio that may caude weight loss.  IPSS:   8 SHIM:  5  Bowel/Bladder complaints, if any:    Nausea/Vomiting, if any: No  Pain issues, if any:  1/10 right knee  SAFETY ISSUES: Prior radiation? No Pacemaker/ICD?  No Possible current pregnancy? Male Is the patient on methotrexate? No  Current Complaints / other details:  No

## 2022-09-01 NOTE — Progress Notes (Signed)
Radiation Oncology         [phone] ________________________________  Initial Outpatient Consultation  Name: Robert [name] MRN: [id number]  Date: 09/04/2022  DOB: November 29, 1950  WU:[id number], Robert Mallick, MD  Marcine Matar, MD   REFERRING PHYSICIAN: Marcine Matar, MD  DIAGNOSIS: 72 y.o. gentleman with Stage T1c adenocarcinoma of the prostate with Gleason score of 3+4, and PSA of 4.3.  No diagnosis found.  HISTORY OF PRESENT ILLNESS: BRONCO STEM is a 72 y.o. male with a diagnosis of prostate cancer. He was noted to have an elevated PSA of 4.3 by his primary care physician, Dr. Lodema Hong.  Accordingly, he was referred for evaluation in urology by Dr. Retta Diones on 06/06/22,  digital rectal examination was performed at that time revealing no suspicious nodules or asymmetries.  The patient proceeded to transrectal ultrasound with 12 biopsies of the prostate on 07/11/22.  The prostate volume measured 43 cc.  Out of 12 core biopsies, 6 were positive.  The maximum Gleason score was 3+4, and this was seen in the right mid, right base lateral, and right apex lateral. Additionally gleason score of 3+3 was seen in the left mid lateral, left apex lateral, and apex.   The patient reviewed the biopsy results with his Dr. Retta Diones and he has kindly been referred today for discussion of potential radiation treatment options. *** After discussing with Dr. Retta Diones, patient would like to proceed with brachytherapy ***    PREVIOUS RADIATION THERAPY: {EXAM; YES/NO:19492::"No"}  PAST MEDICAL HISTORY:  Past Medical History:  Diagnosis Date   Arteriosclerotic cardiovascular disease (ASCVD)    DES to LAD D1 - 2003 in [place]; failed intervention for a totally obstructed RCA at that time; EF of 45%; 12/2009: Equivocal stress nuclear with good exercise tolerance, negative stress EKG, normal EF with mild mid and distal inferior ischemia   Arthritis    Diabetes mellitus, insulin dependent (IDDM),  controlled 10/25/2012   HBA1C is 6.9 on 10/22/2012, Phreesia 04/26/2020   Diabetes mellitus, type II (HCC)    Hyperlipidemia    Lipid profile in 09/2011:128, 130, 33, 69   Hypertension    Lab  09/2011: Normal CMet ex G-133   Pneumonia    Sleep apnea    no CPAP, can't tolerate   Tobacco abuse, in remission    30 pack years; quit in 1985      PAST SURGICAL HISTORY: Past Surgical History:  Procedure Laterality Date   BIOPSY  01/05/2022   Procedure: BIOPSY;  Surgeon: Marguerita Merles, Reuel Boom, MD;  Location: [facility];  Service: Gastroenterology;;   bone spur Left    foot big toe   CARDIAC CATHETERIZATION  2003   DES placed,  done in [place]   COLONOSCOPY  04/03/2008   Negative screening study   COLONOSCOPY N/A 11/11/2014   Procedure: COLONOSCOPY;  Surgeon: Malissa Hippo, MD;  Location: [facility];  Service: Endoscopy;  Laterality: N/A;  1030   COLONOSCOPY WITH PROPOFOL N/A 01/05/2022   Procedure: COLONOSCOPY WITH PROPOFOL;  Surgeon: Dolores Frame, MD;  Location: [facility];  Service: Gastroenterology;  Laterality: N/A;  730 ASA 2   EPIDIDYMIS SURGERY  04/03/1994   ESOPHAGOGASTRODUODENOSCOPY (EGD) WITH PROPOFOL N/A 01/05/2022   Procedure: ESOPHAGOGASTRODUODENOSCOPY (EGD) WITH PROPOFOL;  Surgeon: Dolores Frame, MD;  [facility]: [facility];  Service: Gastroenterology;  Laterality: N/A;   HERNIA REPAIR     umbilical as a baby   KNEE ARTHROSCOPY W/ MENISCECTOMY  11/01/2008   Right  POLYPECTOMY  01/05/2022   Procedure: POLYPECTOMY;  Surgeon: Dolores Frame, MD;  Location: [facility];  Service: Gastroenterology;;   PRESSURE ULCER DEBRIDEMENT  04/03/2004   Right lower extremity   ROTATOR CUFF REPAIR  07/02/2009   Right   TOTAL KNEE ARTHROPLASTY Right 04/24/2022   Procedure: TOTAL KNEE ARTHROPLASTY;  Surgeon: Ollen Gross, MD;  Location: [facility];  Service: Orthopedics;  Laterality: Right;    FAMILY HISTORY:  Family History  Problem  Relation Age of Onset   Diabetes Sister    Arthritis Brother    Diabetes Brother    Arthritis Mother    Cancer Father        Hypernephroma    SOCIAL HISTORY:  Social History   Socioeconomic History   Marital status: Married    Spouse name: Not on file   Number of children: 2   Years of education: Not on file   Highest education level: Associate degree: occupational, Scientist, product/process development, or vocational program  Occupational History   Occupation: Retired  Tobacco Use   Smoking status: Former    Packs/day: 1.00    Years: 30.00    Additional pack years: 0.00    Total pack years: 30.00    Types: Cigarettes    Start date: 04/03/1966    Quit date: 09/02/1983    Years since quitting: 39.0   Smokeless tobacco: Never  Vaping Use   Vaping Use: Never used  Substance and Sexual Activity   Alcohol use: No    Alcohol/week: 0.0 standard drinks of alcohol    Comment: Former Therapist, nutritional -discontinued use in 2005   Drug use: No   Sexual activity: Yes  Other Topics Concern   Not on file  Social History Narrative   Not on file   Social Determinants of Health   Financial Resource Strain: Low Risk  (07/17/2022)   Overall Financial Resource Strain (CARDIA)    Difficulty of Paying Living Expenses: Not hard at all  Food Insecurity: No Food Insecurity (07/17/2022)   Hunger Vital Sign    Worried About Running Out of Food in the Last Year: Never true    Ran Out of Food in the Last Year: Never true  Transportation Needs: No Transportation Needs (07/17/2022)   PRAPARE - Administrator, Civil Service (Medical): No    Lack of Transportation (Non-Medical): No  Physical Activity: Insufficiently Active (07/17/2022)   Exercise Vital Sign    Days of Exercise per Week: 2 days    Minutes of Exercise per Session: 30 min  Stress: No Stress Concern Present (07/17/2022)   [employer] of Occupational Health - Occupational Stress Questionnaire    Feeling of Stress : Only a little  Social Connections:  Socially Integrated (07/17/2022)   Social Connection and Isolation Panel [NHANES]    Frequency of Communication with Friends and Family: Three times a week    Frequency of Social Gatherings with Friends and Family: Three times a week    Attends Religious Services: More than 4 times per year    Active Member of Clubs or Organizations: Yes    Attends Banker Meetings: 1 to 4 times per year    Marital Status: Married  Catering manager Violence: Not At Risk (04/24/2022)   Humiliation, Afraid, Rape, and Kick questionnaire    Fear of Current or Ex-Partner: No    Emotionally Abused: No    Physically Abused: No    Sexually Abused: No    ALLERGIES: Metformin and related,  Oxycodone, Rosuvastatin, and Codeine  MEDICATIONS:  Current Outpatient Medications  Medication Sig Dispense Refill   acetaminophen (TYLENOL) 650 MG CR tablet Take 650 mg by mouth every 8 (eight) hours as needed for pain.     aspirin EC 81 MG tablet Take 81 mg by mouth daily.     atorvastatin (LIPITOR) 40 MG tablet Take 1 tablet (40 mg total) by mouth daily. (Patient taking differently: Take 40 mg by mouth at bedtime.) 90 tablet 3   Continuous Blood Gluc Sensor (FREESTYLE LIBRE 2 SENSOR) MISC Use to monitor blood glucose continuously. Change sensor every 14 days. 2 each 5   Continuous Glucose Receiver (FREESTYLE LIBRE 3 READER) DEVI Use as directed. 1 each 0   Continuous Glucose Sensor (FREESTYLE LIBRE 3 SENSOR) MISC Place 1 sensor on the skin every 14 days. Use to check glucose continuously 6 each 3   Dulaglutide (TRULICITY) 1.5 MG/0.5ML SOPN Inject 1.5 mg into the skin once a week. 6 mL 3   insulin glargine (LANTUS SOLOSTAR) 100 UNIT/ML Solostar Pen Inject 40 Units into the skin at bedtime. 45 mL 3   Insulin Pen Needle (TECHLITE PEN NEEDLES) 31G X 8 MM MISC Use as directed once daily 100 each 0   loratadine (CLARITIN) 10 MG tablet Take 1 tablet (10 mg total) by mouth daily. 30 tablet 1   metoprolol succinate  (TOPROL-XL) 50 MG 24 hr tablet Take 1 tablet (50 mg total) by mouth daily with or immediately following a meal 90 tablet 3   nitroGLYCERIN (NITROSTAT) 0.4 MG SL tablet Place 1 tablet (0.4 mg total) under the tongue every 5 (five) minutes as needed for chest pain. 25 tablet 3   olmesartan (BENICAR) 20 MG tablet Take 1 tablet (20 mg total) by mouth daily. (Patient taking differently: Take 20 mg by mouth at bedtime.) 90 tablet 3   omeprazole (PRILOSEC) 40 MG capsule Take 1 capsule (40 mg total) by mouth daily. (Patient taking differently: Take 40 mg by mouth at bedtime.) 90 capsule 3   sildenafil (VIAGRA) 100 MG tablet Take 0.5-1 tablets (50-100 mg total) by mouth as needed. 30 tablet 11   traMADol (ULTRAM) 50 MG tablet Take 1-2 tablets (50-100 mg total) by mouth every 6 (six) hours as needed for moderate pain 40 tablet 0   Vitamin D, Ergocalciferol, (DRISDOL) 1.25 MG (50000 UNIT) CAPS capsule Take 1 capsule (50,000 Units total) by mouth every 7 (seven) days. 5 capsule 8   No current facility-administered medications for this visit.    REVIEW OF SYSTEMS:  On review of systems, the patient reports that he is doing well overall. He denies any chest pain, shortness of breath, cough, fevers, chills, night sweats, unintended weight changes. He denies any bowel disturbances, and denies abdominal pain, nausea or vomiting. He denies any new musculoskeletal or joint aches or pains. His IPSS was  , indicating *** urinary symptoms (Reference 0-7 mild, 8-19 moderate, 20-35 severe).  His SHIM was  , indicating he {does not have/has mild/moderate/severe} erectile dysfunction (Reference - 22-25 None, 17-21 Mild, 8-16 Moderate, 1-7 Severe). A complete review of systems is obtained and is otherwise negative.   PHYSICAL EXAM:  Wt Readings from Last 3 Encounters:  07/21/22 197 lb (89.4 kg)  07/17/22 200 lb 12.8 oz (91.1 kg)  04/24/22 205 lb 0.4 oz (93 kg)   Temp Readings from Last 3 Encounters:  07/11/22 98 F (36.7  C) (Oral)  04/25/22 98.5 F (36.9 C)  04/20/22 98.5 F (36.9 C) (Oral)  BP Readings from Last 3 Encounters:  08/22/22 108/68  07/25/22 120/68  07/21/22 138/74   Pulse Readings from Last 3 Encounters:  08/22/22 88  07/21/22 67  07/17/22 87    /10  In general this is a well appearing gentleman in no acute distress. He's alert and oriented x4 and appropriate throughout the examination. Cardiopulmonary assessment is negative for acute distress, and he exhibits normal effort.    KPS = ***  100 - Normal; no complaints; no evidence of disease. 90   - Able to carry on normal activity; minor signs or symptoms of disease. 80   - Normal activity with effort; some signs or symptoms of disease. 80   - Cares for self; unable to carry on normal activity or to do active work. 60   - Requires occasional assistance, but is able to care for most of his personal needs. 50   - Requires considerable assistance and frequent medical care. 40   - Disabled; requires special care and assistance. 30   - Severely disabled; hospital admission is indicated although death not imminent. 20   - Very sick; hospital admission necessary; active supportive treatment necessary. 10   - Moribund; fatal processes progressing rapidly. 0     - Dead  Karnofsky DA, Abelmann WH, Craver LS and [facility] [phone]) The use of the nitrogen mustards in the palliative treatment of carcinoma: with particular reference to bronchogenic carcinoma Cancer 1 634-56  LABORATORY DATA:  Lab Results  Component Value Date   WBC 9.9 04/25/2022   HGB 12.3 (L) 04/25/2022   HCT 37.5 (L) 04/25/2022   MCV 91.7 04/25/2022   PLT 227 04/25/2022   Lab Results  Component Value Date   NA 142 07/20/2022   K 4.6 07/20/2022   CL 105 07/20/2022   CO2 23 07/20/2022   Lab Results  Component Value Date   ALT 19 07/20/2022   AST 19 07/20/2022   ALKPHOS 131 (H) 07/20/2022   BILITOT 0.4 07/20/2022     RADIOGRAPHY: No results found.     IMPRESSION/PLAN: 1. 72 y.o. gentleman with Stage T1c adenocarcinoma of the prostate with Gleason score of 3+4, and PSA of 4.3.  We discussed the patient's workup and outlined the nature of prostate cancer in this setting. The patient's T stage, Gleason's score, and PSA put him into the favorable intermediate risk group. Accordingly, he is eligible for a variety of potential treatment options including brachytherapy, 5.5 weeks of external radiation, or prostatectomy. We discussed the available radiation techniques, and focused on the details and logistics of delivery. We discussed and outlined the risks, benefits, short and long-term effects associated with radiotherapy and compared and contrasted these with prostatectomy. We discussed the role of SpaceOAR gel in reducing the rectal toxicity associated with radiotherapy. He appears to have a good understanding of his disease and our treatment recommendations which are of curative intent. He was encouraged to ask questions that were answered to his stated satisfaction.  At the conclusion of our conversation, the patient is interested in moving forward with ***.  We personally spent *** minutes in this encounter including chart review, reviewing radiological studies, meeting face-to-face with the patient, entering orders and completing documentation.     Joyice Faster, PA-C    Margaretmary Dys, MD  [facility]  Radiation Oncology Direct Dial: [phone]  Fax: [phone] [email or web address]  Skype  LinkedIn

## 2022-09-04 ENCOUNTER — Ambulatory Visit
Admission: RE | Admit: 2022-09-04 | Discharge: 2022-09-04 | Disposition: A | Discharge status: Home / Self Care | Payer: PPO | Source: Ambulatory Visit | Attending: Radiation Oncology | Admitting: Radiation Oncology

## 2022-09-04 ENCOUNTER — Encounter: Payer: Self-pay | Admitting: Radiation Oncology

## 2022-09-04 ENCOUNTER — Other Ambulatory Visit: Payer: Self-pay

## 2022-09-04 ENCOUNTER — Other Ambulatory Visit (HOSPITAL_COMMUNITY): Payer: Self-pay

## 2022-09-04 VITALS — BP 125/71 | HR 75 | Temp 97.7°F | Resp 20 | Ht 68.75 in | Wt 205.4 lb

## 2022-09-04 DIAGNOSIS — C61 Malignant neoplasm of prostate: Secondary | ICD-10-CM | POA: Diagnosis not present

## 2022-09-04 DIAGNOSIS — E785 Hyperlipidemia, unspecified: Secondary | ICD-10-CM | POA: Insufficient documentation

## 2022-09-04 DIAGNOSIS — M129 Arthropathy, unspecified: Secondary | ICD-10-CM | POA: Insufficient documentation

## 2022-09-04 DIAGNOSIS — I10 Essential (primary) hypertension: Secondary | ICD-10-CM | POA: Diagnosis not present

## 2022-09-04 DIAGNOSIS — Z79899 Other long term (current) drug therapy: Secondary | ICD-10-CM | POA: Insufficient documentation

## 2022-09-04 DIAGNOSIS — Z7985 Long-term (current) use of injectable non-insulin antidiabetic drugs: Secondary | ICD-10-CM | POA: Diagnosis not present

## 2022-09-04 DIAGNOSIS — G4730 Sleep apnea, unspecified: Secondary | ICD-10-CM | POA: Diagnosis not present

## 2022-09-04 DIAGNOSIS — E119 Type 2 diabetes mellitus without complications: Secondary | ICD-10-CM | POA: Insufficient documentation

## 2022-09-04 DIAGNOSIS — Z7982 Long term (current) use of aspirin: Secondary | ICD-10-CM | POA: Diagnosis not present

## 2022-09-04 DIAGNOSIS — Z87891 Personal history of nicotine dependence: Secondary | ICD-10-CM | POA: Insufficient documentation

## 2022-09-04 DIAGNOSIS — I2510 Atherosclerotic heart disease of native coronary artery without angina pectoris: Secondary | ICD-10-CM | POA: Insufficient documentation

## 2022-09-04 DIAGNOSIS — Z191 Hormone sensitive malignancy status: Secondary | ICD-10-CM | POA: Diagnosis not present

## 2022-09-05 NOTE — Progress Notes (Signed)
Introduced myself to the patient as the prostate nurse navigator.  No barriers to care identified at this time.  He is here to discuss his radiation treatment options.  I gave him my business card and asked him to call me with questions or concerns.  Verbalized understanding.  ?

## 2022-09-08 ENCOUNTER — Telehealth: Payer: Self-pay | Admitting: *Deleted

## 2022-09-08 NOTE — Telephone Encounter (Signed)
CALLED PATIENT TO UPDATE, LVM FOR A RETURN CALL 

## 2022-09-14 ENCOUNTER — Ambulatory Visit (INDEPENDENT_AMBULATORY_CARE_PROVIDER_SITE_OTHER): Payer: PPO | Admitting: Nurse Practitioner

## 2022-09-14 ENCOUNTER — Encounter: Payer: Self-pay | Admitting: Nurse Practitioner

## 2022-09-14 VITALS — BP 115/64 | HR 80 | Ht 69.0 in | Wt 201.3 lb

## 2022-09-14 DIAGNOSIS — G4730 Sleep apnea, unspecified: Secondary | ICD-10-CM | POA: Diagnosis not present

## 2022-09-14 DIAGNOSIS — C61 Malignant neoplasm of prostate: Secondary | ICD-10-CM | POA: Diagnosis not present

## 2022-09-14 DIAGNOSIS — G4733 Obstructive sleep apnea (adult) (pediatric): Secondary | ICD-10-CM

## 2022-09-14 DIAGNOSIS — G4719 Other hypersomnia: Secondary | ICD-10-CM | POA: Diagnosis not present

## 2022-09-14 DIAGNOSIS — R0683 Snoring: Secondary | ICD-10-CM

## 2022-09-14 NOTE — Assessment & Plan Note (Signed)
History of mild OSA with AHI 10/h, diagnosed in 2017. Intolerant of CPAP. He has lost a significant amount of weight and sleep quality seems to be improved. He still has daytime fatigue and occasional snoring at night. We discussed that we will need to repeat a sleep study to determine if he still has sleep apnea, and if so, how severe it is. He understands that he would not be a candidate for Inspire unless he has moderate to severe OSA.    - had an extensive discussion regarding the adverse health consequences related to untreated sleep disordered breathing - specifically discussed the risks for hypertension, coronary artery disease, cardiac dysrhythmias, cerebrovascular disease, and diabetes - lifestyle modification discussed   - discussed how sleep disruption can increase risk of accidents, particularly when driving - safe driving practices were discussed  Patient Instructions  Given your symptoms, I am concerned that you may have sleep disordered breathing with sleep apnea. You will need a sleep study for further evaluation. Someone will contact you to schedule this.   We discussed how untreated sleep apnea puts an individual at risk for cardiac arrhthymias, pulm HTN, DM, stroke and increases their risk for daytime accidents. We also briefly reviewed treatment options including weight loss, side sleeping position, oral appliance, CPAP therapy or referral to ENT for possible surgical options  Use caution when driving and pull over if you become sleepy.  Follow up in 6 weeks with Dr Vassie Loll or Dr Craige Cotta. If not available, can do virtual visit Katie Aragorn Recker,NP to go over sleep study results, or sooner, if needed

## 2022-09-14 NOTE — Progress Notes (Signed)
@Patient  ID: Robert [name], male    DOB: [birth year], 72 y.o.   MRN: [id number]  Chief Complaint  Patient presents with   Consult    Pt sleep consult states that he is interested in inspire to treat OSA    Referring provider: Kerri Perches, MD  HPI: 72 year old male, former smoker referred for sleep consult. Past medical history significant for HTN, ASCVD, DM II, OSA, OA, adenocarinoma of prostate, HLD, seasonal allergies.   TEST/EVENTS:   09/14/2022:Today - sleep consult Patient presents today for sleep consult, referred by Dr. Lodema Hong. He has a history of mild sleep apnea, diagnosed in 2017. AHI was 10/h. He was started on CPAP. He used it on and off for a few years but had trouble tolerating the CPAP. Couldn't seem to find a mask that was comfortable. He tried at least three masks. He stopped wearing it 2-3 years ago. He was discussing this with his primary, who advised him of the [place] device. He would like more information on this.  He does have some snoring from what he has been told. He lost 30 lb over the last year with Trulicity and feels like his sleep has improved and he's not snoring as much. He still has some daytime fatigue. If he's up and moving around, he tells me he's fine but once he sits down to rest, he tends to doze off. He was recently diagnosed with prostate cancer and is awaiting radioactive seed placement. Not sure if this has something to do with his daytime sleepiness. He doesn't feel limited by his fatigue. He has some restless sleep. Can take him a while to fall asleep some nights but other nights, he dozes off within 10-15 minutes. He's usually up 2-3 times to use the restroom. He denies any witnessed apneas, morning headaches, sleep parasomnias/paralysis, drowsy driving. No history of narcolepsy or cataplexy.  He goes to bed around 11 pm. Falls asleep in 10-15 minutes. Wakes 2-3 times a night. Usually up around 7:30 am. He is retired. No supplemental  oxygen. He has a history of hypertension, DM; controlled on medication regimen. No history of stroke.  He quit smoking in 1985 with 30 pack year history. He quit drinking in 2005. No excessive caffeine intake. Lives with his wife. He is retired. Family history of heart disease and cancer.   [place] 6  Allergies  Allergen Reactions   Metformin And Related Other (See Comments)    Stomach upset   Oxycodone     Other reaction(s): Not available   Rosuvastatin Other (See Comments)    Adverse GI symptoms   Codeine     "Did not work" and "made me feel foolish"    Immunization History  Administered Date(s) Administered   Fluad Quad(high Dose 65+) 01/01/2019, 12/17/2020, 12/27/2021   Influenza Split 02/14/2011, 12/18/2011   Influenza Whole 12/25/2006, 12/23/2008, 01/13/2010   Influenza,inj,Quad PF,6+ Mos 01/10/2013, 12/02/2013, 01/26/2015, 12/09/2015, 11/30/2016, 12/31/2017, 12/13/2019   Moderna Covid-19 Vaccine Bivalent Booster 79yrs & up 01/28/2021   Moderna SARS-COV2 Booster Vaccination 01/12/2022   PFIZER(Purple Top)SARS-COV-2 Vaccination 05/01/2019, 05/23/2019, 01/08/2020, 07/12/2020   Pneumococcal Conjugate-13 11/10/2013   Pneumococcal Polysaccharide-23 03/10/2004, 01/13/2010, 11/30/2016   Rsv, Bivalent, Protein Subunit Rsvpref,pf Verdis Frederickson) 12/27/2021   Tdap 12/18/2011   Zoster Recombinat (Shingrix) 12/17/2019, 08/03/2020   Zoster, Live 12/18/2011    Past Medical History:  Diagnosis Date   Arteriosclerotic cardiovascular disease (ASCVD)    DES to LAD D1 - 2003 in [place]; failed intervention for  a totally obstructed RCA at that time; EF of 45%; 12/2009: Equivocal stress nuclear with good exercise tolerance, negative stress EKG, normal EF with mild mid and distal inferior ischemia   Arthritis    Diabetes mellitus, insulin dependent (IDDM), controlled 10/25/2012   HBA1C is 6.9 on 10/22/2012, [employer] 04/26/2020   Diabetes mellitus, type II (HCC)    Hyperlipidemia    Lipid profile  in 09/2011:128, 130, 33, 69   Hypertension    Lab  09/2011: Normal CMet ex G-133   Pneumonia    Sleep apnea    no CPAP, can't tolerate   Tobacco abuse, in remission    30 pack years; quit in 1985    Tobacco History: Social History   Tobacco Use  Smoking Status Former   Packs/day: 1.00   Years: 30.00   Additional pack years: 0.00   Total pack years: 30.00   Types: Cigarettes   Start date: 04/03/1966   Quit date: 09/02/1983   Years since quitting: 39.0  Smokeless Tobacco Never   Counseling given: Not Answered   Outpatient Medications Prior to Visit  Medication Sig Dispense Refill   acetaminophen (TYLENOL) 650 MG CR tablet Take 650 mg by mouth every 8 (eight) hours as needed for pain.     aspirin EC 81 MG tablet Take 81 mg by mouth daily.     atorvastatin (LIPITOR) 40 MG tablet Take 1 tablet (40 mg total) by mouth daily. (Patient taking differently: Take 40 mg by mouth at bedtime.) 90 tablet 3   Continuous Blood Gluc Sensor (FREESTYLE LIBRE 2 SENSOR) MISC Use to monitor blood glucose continuously. Change sensor every 14 days. 2 each 5   Continuous Glucose Receiver (FREESTYLE LIBRE 3 READER) DEVI Use as directed. 1 each 0   Continuous Glucose Sensor (FREESTYLE LIBRE 3 SENSOR) MISC Place 1 sensor on the skin every 14 days. Use to check glucose continuously 6 each 3   Dulaglutide (TRULICITY) 1.5 MG/0.5ML SOPN Inject 1.5 mg into the skin once a week. 6 mL 3   insulin glargine (LANTUS SOLOSTAR) 100 UNIT/ML Solostar Pen Inject 40 Units into the skin at bedtime. 45 mL 3   Insulin Pen Needle (TECHLITE PEN NEEDLES) 31G X 8 MM MISC Use as directed once daily 100 each 0   loratadine (CLARITIN) 10 MG tablet Take 1 tablet (10 mg total) by mouth daily. 30 tablet 1   metoprolol succinate (TOPROL-XL) 50 MG 24 hr tablet Take 1 tablet (50 mg total) by mouth daily with or immediately following a meal 90 tablet 3   nitroGLYCERIN (NITROSTAT) 0.4 MG SL tablet Place 1 tablet (0.4 mg total) under the tongue  every 5 (five) minutes as needed for chest pain. 25 tablet 3   olmesartan (BENICAR) 20 MG tablet Take 1 tablet (20 mg total) by mouth daily. (Patient taking differently: Take 20 mg by mouth at bedtime.) 90 tablet 3   omeprazole (PRILOSEC) 40 MG capsule Take 1 capsule (40 mg total) by mouth daily. (Patient taking differently: Take 40 mg by mouth at bedtime.) 90 capsule 3   sildenafil (VIAGRA) 100 MG tablet Take 0.5-1 tablets (50-100 mg total) by mouth as needed. 30 tablet 11   Vitamin D, Ergocalciferol, (DRISDOL) 1.25 MG (50000 UNIT) CAPS capsule Take 1 capsule (50,000 Units total) by mouth every 7 (seven) days. 5 capsule 8   No facility-administered medications prior to visit.     Review of Systems:   Constitutional: No night sweats, fevers, chills, or lassitude. +fatigue, intentional weight  loss HEENT: No headaches, difficulty swallowing, tooth/dental problems, or sore throat. No sneezing, itching, ear ache, nasal congestion, or post nasal drip CV:  No chest pain, orthopnea, PND, swelling in lower extremities, anasarca, dizziness, palpitations, syncope Resp: +snoring. No shortness of breath with exertion or at rest. No excess mucus or change in color of mucus. No productive or non-productive. No hemoptysis. No wheezing.  No chest wall deformity GI:  No heartburn, indigestion GU: No dysuria, change in color of urine, urgency. +nocturia Skin: No rash, lesions, ulcerations MSK:  No joint pain or swelling.   Neuro: No dizziness or lightheadedness.  Psych: No depression or anxiety. Mood stable.     Physical Exam:  BP 115/64   Pulse 80   Ht 5\' 9"  (1.753 m)   Wt 201 lb 4.8 oz (91.3 kg)   SpO2 95%   BMI 29.73 kg/m   GEN: Pleasant, interactive, well-appearing; in no acute distress. HEENT:  Normocephalic and atraumatic. PERRLA. Sclera white. Nasal turbinates pink, moist and patent bilaterally. No rhinorrhea present. Oropharynx pink and moist, without exudate or edema. No lesions,  ulcerations, or postnasal drip. Mallampati II NECK:  Supple w/ fair ROM. No JVD present. Normal carotid impulses w/o bruits. Thyroid symmetrical with no goiter or nodules palpated. No lymphadenopathy.   CV: RRR, no m/r/g, no peripheral edema. Pulses intact, +2 bilaterally. No cyanosis, pallor or clubbing. PULMONARY:  Unlabored, regular breathing. Clear bilaterally A&P w/o wheezes/rales/rhonchi. No accessory muscle use.  GI: BS present and normoactive. Soft, non-tender to palpation. No organomegaly or masses detected.  MSK: No erythema, warmth or tenderness. Cap refil <2 sec all extrem. No deformities or joint swelling noted.  Neuro: A/Ox3. No focal deficits noted.   Skin: Warm, no lesions or rashe Psych: Normal affect and behavior. Judgement and thought content appropriate.     Lab Results:  CBC    Component Value Date/Time   WBC 9.9 04/25/2022 0401   RBC 4.09 (L) 04/25/2022 0401   HGB 12.3 (L) 04/25/2022 0401   HGB 14.1 04/04/2022 1030   HCT 37.5 (L) 04/25/2022 0401   HCT 43.3 04/04/2022 1030   PLT 227 04/25/2022 0401   PLT 244 04/04/2022 1030   MCV 91.7 04/25/2022 0401   MCV 90 04/04/2022 1030   MCH 30.1 04/25/2022 0401   MCHC 32.8 04/25/2022 0401   RDW 14.0 04/25/2022 0401   RDW 13.4 04/04/2022 1030   LYMPHSABS 1.8 04/04/2022 1030   MONOABS 0.5 11/06/2013 0901   EOSABS 0.3 04/04/2022 1030   BASOSABS 0.0 04/04/2022 1030    BMET    Component Value Date/Time   NA 142 07/20/2022 0924   K 4.6 07/20/2022 0924   CL 105 07/20/2022 0924   CO2 23 07/20/2022 0924   GLUCOSE 97 07/20/2022 0924   GLUCOSE 140 (H) 04/25/2022 0401   BUN 13 07/20/2022 0924   CREATININE 1.16 07/20/2022 0924   CREATININE 1.29 (H) 01/16/2020 0802   CALCIUM 9.3 07/20/2022 0924   GFRNONAA >60 04/25/2022 0401   GFRNONAA 57 (L) 01/16/2020 0802   GFRAA 64 04/22/2020 1206   GFRAA 66 01/16/2020 0802    BNP No results found for: "BNP"   Imaging:  No results found.        No data to display           No results found for: "NITRICOXIDE"      Assessment & Plan:   Sleep apnea in adult History of mild OSA with AHI 10/h, diagnosed in 2017. Intolerant of  CPAP. He has lost a significant amount of weight and sleep quality seems to be improved. He still has daytime fatigue and occasional snoring at night. We discussed that we will need to repeat a sleep study to determine if he still has sleep apnea, and if so, how severe it is. He understands that he would not be a candidate for Inspire unless he has moderate to severe OSA.    - had an extensive discussion regarding the adverse health consequences related to untreated sleep disordered breathing - specifically discussed the risks for hypertension, coronary artery disease, cardiac dysrhythmias, cerebrovascular disease, and diabetes - lifestyle modification discussed   - discussed how sleep disruption can increase risk of accidents, particularly when driving - safe driving practices were discussed  Patient Instructions  Given your symptoms, I am concerned that you may have sleep disordered breathing with sleep apnea. You will need a sleep study for further evaluation. Someone will contact you to schedule this.   We discussed how untreated sleep apnea puts an individual at risk for cardiac arrhthymias, pulm HTN, DM, stroke and increases their risk for daytime accidents. We also briefly reviewed treatment options including weight loss, side sleeping position, oral appliance, CPAP therapy or referral to ENT for possible surgical options  Use caution when driving and pull over if you become sleepy.  Follow up in 6 weeks with Dr Vassie Loll or Dr Craige Cotta. If not available, can do virtual visit Katie Myya Meenach,NP to go over sleep study results, or sooner, if needed     Prostate cancer [facility]) Preparing for radioactive seed placement. Followed by urology. Possible contributing factor to his daytime fatigue symptoms.   I spent 35 minutes of dedicated to  the care of this patient on the date of this encounter to include pre-visit review of records, face-to-face time with the patient discussing conditions above, post visit ordering of testing, clinical documentation with the electronic health record, making appropriate referrals as documented, and communicating necessary findings to members of the patients care team.  Noemi Chapel, NP 09/14/2022  Pt aware and understands NP's role.

## 2022-09-14 NOTE — Patient Instructions (Signed)
Given your symptoms, I am concerned that you may have sleep disordered breathing with sleep apnea. You will need a sleep study for further evaluation. Someone will contact you to schedule this.   We discussed how untreated sleep apnea puts an individual at risk for cardiac arrhthymias, pulm HTN, DM, stroke and increases their risk for daytime accidents. We also briefly reviewed treatment options including weight loss, side sleeping position, oral appliance, CPAP therapy or referral to ENT for possible surgical options  Use caution when driving and pull over if you become sleepy.  Follow up in 6 weeks with Dr Vassie Loll or Dr Craige Cotta. If not available, can do virtual visit Katie Makaylyn Sinyard,NP to go over sleep study results, or sooner, if needed

## 2022-09-14 NOTE — Progress Notes (Signed)
Reviewed and agree with assessment/plan.   Coralyn Helling, MD [facility] Pulmonary/Critical Care 09/14/2022, 10:02 AM Pager:  [phone]

## 2022-09-14 NOTE — Assessment & Plan Note (Signed)
Preparing for radioactive seed placement. Followed by urology. Possible contributing factor to his daytime fatigue symptoms.

## 2022-09-19 ENCOUNTER — Ambulatory Visit: Payer: PPO

## 2022-09-19 DIAGNOSIS — G4719 Other hypersomnia: Secondary | ICD-10-CM

## 2022-09-19 DIAGNOSIS — G4733 Obstructive sleep apnea (adult) (pediatric): Secondary | ICD-10-CM | POA: Diagnosis not present

## 2022-09-19 DIAGNOSIS — R0683 Snoring: Secondary | ICD-10-CM

## 2022-09-20 ENCOUNTER — Ambulatory Visit: Payer: PPO | Admitting: Radiation Oncology

## 2022-09-20 ENCOUNTER — Telehealth: Payer: Self-pay | Admitting: *Deleted

## 2022-09-20 DIAGNOSIS — G4733 Obstructive sleep apnea (adult) (pediatric): Secondary | ICD-10-CM | POA: Diagnosis not present

## 2022-09-20 NOTE — Progress Notes (Signed)
Radiation Oncology         [phone] ________________________________  Outpatient Follow up- Pre-seed visit  Name: BEREN LINNEMANN MRN: [id number]  Date: 09/21/2022  DOB: Sep 07, 1950  WU:[id number], [name], MD  Marcine Matar, MD   REFERRING PHYSICIAN: Marcine Matar, MD  DIAGNOSIS: 72 y.o. gentleman with Stage T1c adenocarcinoma of the prostate with Gleason score of 3+4, and PSA of 4.3.     ICD-10-CM   1. Prostate cancer [facility])  C61       HISTORY OF PRESENT ILLNESS: AHIJAH OVER is a 72 y.o. male with a diagnosis of prostate cancer. He was noted to have an elevated PSA of 4.3 by his primary care physician, Dr. Lodema Hong.  Accordingly, he was referred for evaluation in urology by Dr. Retta Diones on 06/06/22,  digital rectal examination was performed at that time revealing no suspicious nodules or asymmetries.  The patient proceeded to transrectal ultrasound with 12 biopsies of the prostate on 07/11/22.  The prostate volume measured 43 cc.  Out of 12 core biopsies, 6 were positive.  The maximum Gleason score was 3+4, and this was seen in the right mid, right base lateral, and right apex lateral. Additionally gleason score of 3+3 was seen in the left mid lateral, left apex lateral, and apex.   The patient reviewed the biopsy results with his urologist and was kindly referred to [place] for discussion of potential radiation treatment options. We initially met the patient on 09/04/22 and he was most interested in proceeding with brachytherapy and SpaceOAR gel placement for treatment of his disease. He is here today for his pre-procedure imaging for planning and to answer any additional questions he may have about this treatment.   PREVIOUS RADIATION THERAPY: No  PAST MEDICAL HISTORY:  Past Medical History:  Diagnosis Date   Arteriosclerotic cardiovascular disease (ASCVD)    DES to LAD D1 - 2003 in [place]; failed intervention for a totally obstructed RCA at that time; EF of 45%; 12/2009:  Equivocal stress nuclear with good exercise tolerance, negative stress EKG, normal EF with mild mid and distal inferior ischemia   Arthritis    Diabetes mellitus, insulin dependent (IDDM), controlled 10/25/2012   HBA1C is 6.9 on 10/22/2012, Phreesia 04/26/2020   Diabetes mellitus, type II (HCC)    Hyperlipidemia    Lipid profile in 09/2011:128, 130, 33, 69   Hypertension    Lab  09/2011: Normal CMet ex G-133   Pneumonia    Sleep apnea    no CPAP, can't tolerate   Tobacco abuse, in remission    30 pack years; quit in 1985      PAST SURGICAL HISTORY: Past Surgical History:  Procedure Laterality Date   BIOPSY  01/05/2022   Procedure: BIOPSY;  Surgeon: Marguerita Merles, Reuel Boom, MD;  [facility];  Service: Gastroenterology;;   bone spur Left    foot big toe   CARDIAC CATHETERIZATION  2003   DES placed,  done in [place]   COLONOSCOPY  04/03/2008   Negative screening study   COLONOSCOPY N/A 11/11/2014   Procedure: COLONOSCOPY;  Surgeon: Malissa Hippo, MD;  Location: [facility];  Service: Endoscopy;  Laterality: N/A;  1030   COLONOSCOPY WITH PROPOFOL N/A 01/05/2022   Procedure: COLONOSCOPY WITH PROPOFOL;  Surgeon: Dolores Frame, MD;  Location: [facility];  Service: Gastroenterology;  Laterality: N/A;  730 ASA 2   EPIDIDYMIS SURGERY  04/03/1994   ESOPHAGOGASTRODUODENOSCOPY (EGD) WITH PROPOFOL N/A 01/05/2022   Procedure: ESOPHAGOGASTRODUODENOSCOPY (  EGD) WITH PROPOFOL;  Surgeon: Marguerita Merles, Reuel Boom, MD;  [facility];  Service: Gastroenterology;  Laterality: N/A;   HERNIA REPAIR     umbilical as a baby   KNEE ARTHROSCOPY W/ MENISCECTOMY  11/01/2008   Right   POLYPECTOMY  01/05/2022   Procedure: POLYPECTOMY;  Surgeon: Dolores Frame, MD;  [facility]: [facility];  Service: Gastroenterology;;   PRESSURE ULCER DEBRIDEMENT  04/03/2004   Right lower extremity   ROTATOR CUFF REPAIR  07/02/2009   Right   TOTAL KNEE ARTHROPLASTY Right  04/24/2022   Procedure: TOTAL KNEE ARTHROPLASTY;  Surgeon: Ollen Gross, MD;  Location: [facility];  Service: Orthopedics;  Laterality: Right;    FAMILY HISTORY:  Family History  Problem Relation Age of Onset   Diabetes Sister    Arthritis Brother    Diabetes Brother    Arthritis Mother    Cancer Father        Hypernephroma    SOCIAL HISTORY:  Social History   Socioeconomic History   Marital status: Married    Spouse name: Not on file   Number of children: 2   Years of education: Not on file   Highest education level: Associate degree: occupational, Scientist, product/process development, or vocational program  Occupational History   Occupation: Retired  Tobacco Use   Smoking status: Former    Packs/day: 1.00    Years: 30.00    Additional pack years: 0.00    Total pack years: 30.00    Types: Cigarettes    Start date: 04/03/1966    Quit date: 09/02/1983    Years since quitting: 39.0   Smokeless tobacco: Never  Vaping Use   Vaping Use: Never used  Substance and Sexual Activity   Alcohol use: No    Alcohol/week: 0.0 standard drinks of alcohol    Comment: Former Therapist, nutritional -discontinued use in 2005   Drug use: No   Sexual activity: Yes  Other Topics Concern   Not on file  Social History Narrative   Not on file   Social Determinants of Health   Financial Resource Strain: Low Risk  (07/17/2022)   Overall Financial Resource Strain (CARDIA)    Difficulty of Paying Living Expenses: Not hard at all  Food Insecurity: No Food Insecurity (09/04/2022)   Hunger Vital Sign    Worried About Running Out of Food in the Last Year: Never true    Ran Out of Food in the Last Year: Never true  Transportation Needs: No Transportation Needs (09/04/2022)   PRAPARE - Administrator, Civil Service (Medical): No    Lack of Transportation (Non-Medical): No  Physical Activity: Insufficiently Active (07/17/2022)   Exercise Vital Sign    Days of Exercise per Week: 2 days    Minutes of Exercise per Session: 30 min   Stress: No Stress Concern Present (07/17/2022)   [employer] of Occupational Health - Occupational Stress Questionnaire    Feeling of Stress : Only a little  Social Connections: Socially Integrated (07/17/2022)   Social Connection and Isolation Panel [NHANES]    Frequency of Communication with Friends and Family: Three times a week    Frequency of Social Gatherings with Friends and Family: Three times a week    Attends Religious Services: More than 4 times per year    Active Member of Clubs or Organizations: Yes    Attends Banker Meetings: 1 to 4 times per year    Marital Status: Married  Catering manager Violence: Not  At Risk (09/04/2022)   Humiliation, Afraid, Rape, and Kick questionnaire    Fear of Current or Ex-Partner: No    Emotionally Abused: No    Physically Abused: No    Sexually Abused: No    ALLERGIES: Metformin and related, Oxycodone, Rosuvastatin, and Codeine  MEDICATIONS:  Current Outpatient Medications  Medication Sig Dispense Refill   acetaminophen (TYLENOL) 650 MG CR tablet Take 650 mg by mouth every 8 (eight) hours as needed for pain.     aspirin EC 81 MG tablet Take 81 mg by mouth daily.     atorvastatin (LIPITOR) 40 MG tablet Take 1 tablet (40 mg total) by mouth daily. (Patient taking differently: Take 40 mg by mouth at bedtime.) 90 tablet 3   Continuous Blood Gluc Sensor (FREESTYLE LIBRE 2 SENSOR) MISC Use to monitor blood glucose continuously. Change sensor every 14 days. 2 each 5   Continuous Glucose Receiver (FREESTYLE LIBRE 3 READER) DEVI Use as directed. 1 each 0   Continuous Glucose Sensor (FREESTYLE LIBRE 3 SENSOR) MISC Place 1 sensor on the skin every 14 days. Use to check glucose continuously 6 each 3   Dulaglutide (TRULICITY) 1.5 MG/0.5ML SOPN Inject 1.5 mg into the skin once a week. 6 mL 3   insulin glargine (LANTUS SOLOSTAR) 100 UNIT/ML Solostar Pen Inject 40 Units into the skin at bedtime. 45 mL 3   Insulin Pen Needle (TECHLITE PEN  NEEDLES) 31G X 8 MM MISC Use as directed once daily 100 each 0   loratadine (CLARITIN) 10 MG tablet Take 1 tablet (10 mg total) by mouth daily. 30 tablet 1   metoprolol succinate (TOPROL-XL) 50 MG 24 hr tablet Take 1 tablet (50 mg total) by mouth daily with or immediately following a meal 90 tablet 3   nitroGLYCERIN (NITROSTAT) 0.4 MG SL tablet Place 1 tablet (0.4 mg total) under the tongue every 5 (five) minutes as needed for chest pain. 25 tablet 3   olmesartan (BENICAR) 20 MG tablet Take 1 tablet (20 mg total) by mouth daily. (Patient taking differently: Take 20 mg by mouth at bedtime.) 90 tablet 3   omeprazole (PRILOSEC) 40 MG capsule Take 1 capsule (40 mg total) by mouth daily. (Patient taking differently: Take 40 mg by mouth at bedtime.) 90 capsule 3   sildenafil (VIAGRA) 100 MG tablet Take 0.5-1 tablets (50-100 mg total) by mouth as needed. 30 tablet 11   Vitamin D, Ergocalciferol, (DRISDOL) 1.25 MG (50000 UNIT) CAPS capsule Take 1 capsule (50,000 Units total) by mouth every 7 (seven) days. 5 capsule 8   No current facility-administered medications for this visit.    REVIEW OF SYSTEMS:  On review of systems, the patient reports that he is doing well overall. He denies any chest pain, shortness of breath, cough, fevers, chills, night sweats, unintended weight changes. He denies any bowel disturbances, and denies abdominal pain, nausea or vomiting. He denies any new musculoskeletal or joint aches or pains. His IPSS was 8, indicating moderate urinary symptoms. His SHIM was 5, indicating he has severe erectile dysfunction. A complete review of systems is obtained and is otherwise negative.     PHYSICAL EXAM:  Wt Readings from Last 3 Encounters:  09/14/22 201 lb 4.8 oz (91.3 kg)  09/04/22 205 lb 6.4 oz (93.2 kg)  07/21/22 197 lb (89.4 kg)   Temp Readings from Last 3 Encounters:  09/04/22 97.7 F (36.5 C)  07/11/22 98 F (36.7 C) (Oral)  04/25/22 98.5 F (36.9 C)   BP Readings  from Last  3 Encounters:  09/14/22 115/64  09/04/22 125/71  08/22/22 108/68   Pulse Readings from Last 3 Encounters:  09/14/22 80  09/04/22 75  08/22/22 88    /10  In general this is a well appearing African American male in no acute distress. He's alert and oriented x4 and appropriate throughout the examination. Cardiopulmonary assessment is negative for acute distress, and he exhibits normal effort.     KPS = 100  100 - Normal; no complaints; no evidence of disease. 90   - Able to carry on normal activity; minor signs or symptoms of disease. 80   - Normal activity with effort; some signs or symptoms of disease. 40   - Cares for self; unable to carry on normal activity or to do active work. 60   - Requires occasional assistance, but is able to care for most of his personal needs. 50   - Requires considerable assistance and frequent medical care. 40   - Disabled; requires special care and assistance. 30   - Severely disabled; hospital admission is indicated although death not imminent. 20   - Very sick; hospital admission necessary; active supportive treatment necessary. 10   - Moribund; fatal processes progressing rapidly. 0     - Dead  Karnofsky DA, Abelmann WH, Craver LS and Burchenal [facility] [phone]) The use of the nitrogen mustards in the palliative treatment of carcinoma: with particular reference to bronchogenic carcinoma Cancer 1 634-56  LABORATORY DATA:  Lab Results  Component Value Date   WBC 9.9 04/25/2022   HGB 12.3 (L) 04/25/2022   HCT 37.5 (L) 04/25/2022   MCV 91.7 04/25/2022   PLT 227 04/25/2022   Lab Results  Component Value Date   NA 142 07/20/2022   K 4.6 07/20/2022   CL 105 07/20/2022   CO2 23 07/20/2022   Lab Results  Component Value Date   ALT 19 07/20/2022   AST 19 07/20/2022   ALKPHOS 131 (H) 07/20/2022   BILITOT 0.4 07/20/2022     RADIOGRAPHY: No results found.    IMPRESSION/PLAN: 1. 72 y.o. gentleman with Stage T1c adenocarcinoma of the prostate with  Gleason score of 3+4, and PSA of 4.3.  The patient has elected to proceed with seed implant for treatment of his disease. We reviewed the risks, benefits, short and long-term effects associated with brachytherapy and discussed the role of SpaceOAR in reducing the rectal toxicity associated with radiotherapy.  He appears to have a good understanding of his disease and our treatment recommendations which are of curative intent.  He was encouraged to ask questions that were answered to his stated satisfaction. He has freely signed written consent to proceed today in the office and a copy of this document will be placed in his medical record. His procedure will be scheduled in collaboration with Dr. Retta Diones or one of his colleagues and we will see him back for his post-procedure visit approximately 3 weeks thereafter. We look forward to continuing to participate in his care. He knows that he is welcome to call with any questions or concerns at any time in the interim.  I personally spent 30 minutes in this encounter including chart review, reviewing radiological studies, meeting face-to-face with the patient, entering orders and completing documentation.    Marguarite Arbour, MMS, PA-C [facility] Radiation Oncology Physician Assistant Direct Dial: [phone]  Fax: [phone]

## 2022-09-20 NOTE — Telephone Encounter (Signed)
CALLED PATIENT TO REMIND OF PRE-SEED APPTS. FOR 09-21-22, SPOKE WITH PATIENT AND HE IS AWARE OF THESE APPTS.

## 2022-09-20 NOTE — Progress Notes (Addendum)
  Radiation Oncology         [phone] ________________________________  Name: Robert [name] MRN: [id number]  Date: 09/21/2022  DOB: 22-Jun-1950  SIMULATION AND TREATMENT PLANNING NOTE PUBIC ARCH STUDY  WU:[id number], [name], MD  Marcine Matar, MD  DIAGNOSIS:  72 y.o. gentleman with Stage T1c adenocarcinoma of the prostate with Gleason score of 3+4, and PSA of 4.3.   Oncology History  Prostate cancer (HCC)  07/11/2022 Cancer Staging   Staging form: Prostate, AJCC 8th Edition - Clinical stage from 07/11/2022: Stage IIB (cT1c, cN0, cM0, PSA: 4.3, Grade Group: 2) - Signed by Marcello Fennel, PA-C on 09/20/2022 Histopathologic type: Adenocarcinoma, NOS Stage prefix: Initial diagnosis Prostate specific antigen (PSA) range: Less than 10 Gleason primary pattern: 3 Gleason secondary pattern: 4 Gleason score: 7 Histologic grading system: 5 grade system Number of biopsy cores examined: 12 Number of biopsy cores positive: 6 Location of positive needle core biopsies: Both sides   07/24/2022 Initial Diagnosis   Prostate cancer (HCC)       ICD-10-CM   1. Prostate cancer (HCC)  C61       COMPLEX SIMULATION:  The patient presented today for evaluation for possible prostate seed implant. He was brought to the radiation planning suite and placed supine on the CT couch. A 3-dimensional image study set was obtained in upload to the planning computer. There, on each axial slice, I contoured the prostate gland. Then, using three-dimensional radiation planning tools I reconstructed the prostate in view of the structures from the transperineal needle pathway to assess for possible pubic arch interference. In doing so, I did not appreciate any pubic arch interference. Also, the patient's prostate volume was estimated based on the drawn structure. The volume was 43 cc.  Given the pubic arch appearance and prostate volume, patient remains a good candidate to proceed with prostate seed implant. Today,  he freely provided informed written consent to proceed.    PLAN: The patient will undergo prostate seed implant.   ________________________________  Artist Pais. Kathrynn Running, M.D.

## 2022-09-21 ENCOUNTER — Encounter: Payer: Self-pay | Admitting: Urology

## 2022-09-21 ENCOUNTER — Other Ambulatory Visit: Payer: Self-pay

## 2022-09-21 ENCOUNTER — Ambulatory Visit
Admission: RE | Admit: 2022-09-21 | Discharge: 2022-09-21 | Disposition: A | Discharge status: Home / Self Care | Payer: PPO | Source: Ambulatory Visit | Attending: Urology | Admitting: Urology

## 2022-09-21 ENCOUNTER — Ambulatory Visit
Admission: RE | Admit: 2022-09-21 | Discharge: 2022-09-21 | Disposition: A | Discharge status: Home / Self Care | Payer: PPO | Source: Ambulatory Visit | Attending: Radiation Oncology | Admitting: Radiation Oncology

## 2022-09-21 VITALS — Resp 19 | Ht 69.0 in | Wt 206.0 lb

## 2022-09-21 DIAGNOSIS — C61 Malignant neoplasm of prostate: Secondary | ICD-10-CM

## 2022-09-21 DIAGNOSIS — Z510 Encounter for antineoplastic radiation therapy: Secondary | ICD-10-CM | POA: Diagnosis not present

## 2022-09-21 DIAGNOSIS — Z87891 Personal history of nicotine dependence: Secondary | ICD-10-CM | POA: Insufficient documentation

## 2022-09-21 DIAGNOSIS — Z191 Hormone sensitive malignancy status: Secondary | ICD-10-CM | POA: Diagnosis not present

## 2022-09-21 NOTE — Progress Notes (Signed)
Pre-seed nursing interview for a diagnosis of Stage T1c adenocarcinoma of the prostate with Gleason score of 3+4, and PSA of 4.3.  Patient identity verified x2. Patient reports doing well. No issues conveyed at this time.  Meaningful use complete.  Urinary Management medication(s)- None Urology appointment date- None, with Dr. Retta Diones at [facility].  Resp 19   Ht 5\' 9"  (1.753 m)   Wt 206 lb (93.4 kg)   BMI 30.42 kg/m    This concludes the interview.   Ruel Favors, LPN

## 2022-09-24 NOTE — Addendum Note (Signed)
Encounter addended by: Margaretmary Dys, MD on: 09/24/2022 2:16 PM  Actions taken: Problem List reviewed, Medication List reviewed, Allergies reviewed, Clinical Note Signed

## 2022-10-03 ENCOUNTER — Other Ambulatory Visit: Payer: Self-pay | Admitting: Family Medicine

## 2022-10-04 ENCOUNTER — Other Ambulatory Visit: Payer: Self-pay

## 2022-10-04 ENCOUNTER — Other Ambulatory Visit (HOSPITAL_COMMUNITY): Payer: Self-pay

## 2022-10-04 MED ORDER — TECHLITE PEN NEEDLES 31G X 8 MM MISC
0 refills | Status: DC
  Filled 2022-10-04: qty 100, 100d supply, fill #0

## 2022-10-06 ENCOUNTER — Other Ambulatory Visit (HOSPITAL_COMMUNITY): Payer: Self-pay

## 2022-10-12 ENCOUNTER — Other Ambulatory Visit (HOSPITAL_COMMUNITY): Payer: Self-pay

## 2022-10-12 ENCOUNTER — Telehealth: Payer: Self-pay

## 2022-10-12 NOTE — Telephone Encounter (Signed)
I spoke with Stephani Police. We have discussed possible surgery dates and 12/21/2022 was agreed upon by all parties. Patient given information about surgery date, what to expect pre-operatively and post operatively.    We discussed that a pre-op nurse will be calling to set up the pre-op visit that will take place prior to surgery. Informed patient that our office will communicate any additional care to be provided after surgery.    Patients questions or concerns were discussed during our call. Advised to call our office should there be any additional information, questions or concerns that arise. Patient verbalized understanding.

## 2022-10-13 ENCOUNTER — Telehealth: Payer: Self-pay | Admitting: *Deleted

## 2022-10-13 ENCOUNTER — Other Ambulatory Visit (HOSPITAL_COMMUNITY): Payer: Self-pay

## 2022-10-13 ENCOUNTER — Other Ambulatory Visit: Payer: Self-pay

## 2022-10-13 NOTE — Telephone Encounter (Signed)
CALLED PATIENT TO INFORM OF IMPLANT DATE, SPOKE WITH PATIENT AND HE IS AWARE OF THIS IMPLANT DATE 

## 2022-10-16 ENCOUNTER — Other Ambulatory Visit (HOSPITAL_COMMUNITY): Payer: Self-pay

## 2022-10-24 ENCOUNTER — Ambulatory Visit: Payer: PPO | Admitting: Podiatry

## 2022-11-03 ENCOUNTER — Other Ambulatory Visit (HOSPITAL_COMMUNITY): Payer: Self-pay

## 2022-11-03 ENCOUNTER — Telehealth: Payer: PPO | Admitting: Nurse Practitioner

## 2022-11-04 ENCOUNTER — Other Ambulatory Visit (HOSPITAL_COMMUNITY): Payer: Self-pay

## 2022-11-06 ENCOUNTER — Ambulatory Visit (INDEPENDENT_AMBULATORY_CARE_PROVIDER_SITE_OTHER): Payer: PPO | Admitting: Podiatry

## 2022-11-06 ENCOUNTER — Other Ambulatory Visit: Payer: Self-pay

## 2022-11-06 DIAGNOSIS — B351 Tinea unguium: Secondary | ICD-10-CM | POA: Diagnosis not present

## 2022-11-06 DIAGNOSIS — L84 Corns and callosities: Secondary | ICD-10-CM

## 2022-11-06 DIAGNOSIS — E1151 Type 2 diabetes mellitus with diabetic peripheral angiopathy without gangrene: Secondary | ICD-10-CM | POA: Diagnosis not present

## 2022-11-06 NOTE — Progress Notes (Unsigned)
       Subjective:  Patient ID: [name], male    DOB: 12-26-1950,  MRN: [id number]  Robert [name] presents to clinic today for:  Chief Complaint  Patient presents with   betes    dfc  . Patient notes nails are thick and elongated, causing pain in shoe gear when ambulating.  He also has multiple seed corns throughout the heel and arch area of both feet.  These are painful for him.  PCP is Robert Perches, MD. date last seen was 07/21/2022.  Last HbA1c level on 07/20/2022 was 6.7  Allergies  Allergen Reactions   Metformin And Related Other (See Comments)    Stomach upset   Oxycodone     Other reaction(s): Not available   Rosuvastatin Other (See Comments)    Adverse GI symptoms   Codeine     "Did not work" and "made me feel foolish"    Review of Systems: Negative except as noted in the HPI.  Objective:  There were no vitals filed for this visit.  Robert [name] is a pleasant 72 y.o. male in NAD. AAO x 3.  Vascular Examination: Patient has palpable DP pulse, absent PT pulse bilateral.  Delayed capillary refill bilateral toes.  Sparse digital hair bilateral.  Proximal to distal cooling WNL bilateral.    Dermatological Examination: Interspaces are clear with no open lesions noted bilateral.  Nails are 3-36mm thick, with yellowish/brown discoloration, subungual debris and distal onycholysis x10.  There is pain with compression of nails x10.  There are multiple, punctate hyperkeratotic lesions noted on the plantar aspect of both heels and instep for total of 7 lesions.  No surrounding erythema is noted.     Latest Ref Rng & Units 07/20/2022    9:24 AM 04/04/2022   10:30 AM 11/15/2021   11:02 AM  Hemoglobin A1C  Hemoglobin-A1c 4.8 - 5.6 % 6.7  7.0  6.7    Patient qualifies for at-risk foot care because of diabetes with PVD.  Assessment/Plan: 1. Dermatophytosis of nail   2. Type II diabetes mellitus with peripheral circulatory disorder (HCC)   3. Callus of foot     Mycotic nails x10 were sharply debrided with sterile nail nippers and power debriding burr to decrease bulk and length.  Hyperkeratotic lesions x 7 were shaved with #312 blade.  Follow-up in approximately 3 to 4 months for at risk footcare   D. , DPM, FACFAS [facility]     [address] [place], [place] [address]                Office [phone]  Fax [phone]

## 2022-11-16 DIAGNOSIS — E785 Hyperlipidemia, unspecified: Secondary | ICD-10-CM | POA: Diagnosis not present

## 2022-11-16 DIAGNOSIS — E1159 Type 2 diabetes mellitus with other circulatory complications: Secondary | ICD-10-CM | POA: Diagnosis not present

## 2022-11-17 LAB — CMP14+EGFR
Bilirubin Total: 0.4 mg/dL (ref 0.0–1.2)
Sodium: 140 mmol/L (ref 134–144)

## 2022-11-21 ENCOUNTER — Ambulatory Visit (INDEPENDENT_AMBULATORY_CARE_PROVIDER_SITE_OTHER): Payer: PPO | Admitting: Family Medicine

## 2022-11-21 ENCOUNTER — Encounter: Payer: Self-pay | Admitting: Family Medicine

## 2022-11-21 ENCOUNTER — Other Ambulatory Visit (HOSPITAL_COMMUNITY): Payer: Self-pay

## 2022-11-21 VITALS — BP 118/73 | HR 76 | Ht 69.0 in | Wt 208.1 lb

## 2022-11-21 DIAGNOSIS — Z599 Problem related to housing and economic circumstances, unspecified: Secondary | ICD-10-CM | POA: Diagnosis not present

## 2022-11-21 DIAGNOSIS — N529 Male erectile dysfunction, unspecified: Secondary | ICD-10-CM | POA: Diagnosis not present

## 2022-11-21 DIAGNOSIS — E785 Hyperlipidemia, unspecified: Secondary | ICD-10-CM | POA: Diagnosis not present

## 2022-11-21 DIAGNOSIS — I10 Essential (primary) hypertension: Secondary | ICD-10-CM | POA: Diagnosis not present

## 2022-11-21 DIAGNOSIS — I2510 Atherosclerotic heart disease of native coronary artery without angina pectoris: Secondary | ICD-10-CM | POA: Diagnosis not present

## 2022-11-21 DIAGNOSIS — K219 Gastro-esophageal reflux disease without esophagitis: Secondary | ICD-10-CM | POA: Diagnosis not present

## 2022-11-21 DIAGNOSIS — E1169 Type 2 diabetes mellitus with other specified complication: Secondary | ICD-10-CM | POA: Diagnosis not present

## 2022-11-21 DIAGNOSIS — E669 Obesity, unspecified: Secondary | ICD-10-CM | POA: Diagnosis not present

## 2022-11-21 DIAGNOSIS — K317 Polyp of stomach and duodenum: Secondary | ICD-10-CM

## 2022-11-21 DIAGNOSIS — G4730 Sleep apnea, unspecified: Secondary | ICD-10-CM

## 2022-11-21 DIAGNOSIS — E1159 Type 2 diabetes mellitus with other circulatory complications: Secondary | ICD-10-CM

## 2022-11-21 DIAGNOSIS — K5900 Constipation, unspecified: Secondary | ICD-10-CM | POA: Diagnosis not present

## 2022-11-21 DIAGNOSIS — E559 Vitamin D deficiency, unspecified: Secondary | ICD-10-CM | POA: Diagnosis not present

## 2022-11-21 DIAGNOSIS — C61 Malignant neoplasm of prostate: Secondary | ICD-10-CM | POA: Diagnosis not present

## 2022-11-21 DIAGNOSIS — G4733 Obstructive sleep apnea (adult) (pediatric): Secondary | ICD-10-CM | POA: Diagnosis not present

## 2022-11-21 DIAGNOSIS — F3342 Major depressive disorder, recurrent, in full remission: Secondary | ICD-10-CM | POA: Diagnosis not present

## 2022-11-21 DIAGNOSIS — Z794 Long term (current) use of insulin: Secondary | ICD-10-CM | POA: Diagnosis not present

## 2022-11-21 DIAGNOSIS — I2089 Other forms of angina pectoris: Secondary | ICD-10-CM | POA: Diagnosis not present

## 2022-11-21 MED ORDER — VITAMIN D (ERGOCALCIFEROL) 1.25 MG (50000 UNIT) PO CAPS
50000.0000 [IU] | ORAL_CAPSULE | ORAL | 1 refills | Status: DC
  Filled 2022-11-21: qty 12, 84d supply, fill #0
  Filled 2023-02-09: qty 12, 84d supply, fill #1

## 2022-11-21 NOTE — Patient Instructions (Addendum)
F/U with annual exam early December, call if you need me sooner  Please dO treat sleep apnea to protect heart, lungs and brain, and you will have more energy and be halthier  All the best with upcoming treatment   Continue to work through your fears, acknowledge  that how you feel is NORMAL: for you and oK, and continue to work towards improved, hopeful and positive mental health  I am referring you to GI re polyp in stomach Pls add cBC to recent lab, if unable then needs CBc, non fasting HBA1C, chem 7 and EGFR 3 , 3 to 5 days before next visit  I will message pharmacy re trulicity cost and donut hole situation $234/  month currently)  Recommend reducing lantus to 30 units, but mUST eat regularly, and if FBG remains under 130 , then good  Play golf, commit to daily exercise also  Thanks for choosing [facility], we consider it a privelige to serve you.

## 2022-11-25 ENCOUNTER — Encounter: Payer: Self-pay | Admitting: Family Medicine

## 2022-11-25 LAB — AMB RESULTS CONSOLE CBG: Glucose: 154

## 2022-11-25 NOTE — Assessment & Plan Note (Signed)
Asymptomatic and stable, followed annually by cardiology

## 2022-11-25 NOTE — Progress Notes (Signed)
Please follow up on PCP. No SDOH needs at this time.

## 2022-11-25 NOTE — Progress Notes (Signed)
Robert [name]     MRN: [id number]      DOB: Mar 16, 1951  Chief Complaint  Patient presents with   Follow-up    4 month follow up new dx of prostate cancer    HPI Robert [name] is here for follow up and re-evaluation of chronic medical conditions, medication management and review of any available recent lab and radiology data.  Preventive health is updated, specifically  Cancer screening and Immunization.   Anxious and worried about dx of prostate cancer, is aware that his prognosis is good, but he is still adjusting/ reacting Denies polyuria, polydipsia, blurred vision , or hypoglycemic episodes.Trulicity cost up to over $200/monht now he is in the donut hole , which is a challenge    ROS Denies recent fever or chills. Denies sinus pressure, nasal congestion, ear pain or sore throat. Denies chest congestion, productive cough or wheezing. Denies chest pains, palpitations and leg swelling Denies abdominal pain, nausea, vomiting,diarrhea or constipation.   Denies dysuria, frequency, hesitancy or incontinence. Denies joint pain, swelling and limitation in mobility. Denies headaches, seizures, numbness, or tingling. Denies skin break down or rash.   PE  BP 118/73 (BP Location: Right Arm, Patient Position: Sitting, Cuff Size: Normal)   Pulse 76   Ht 5\' 9"  (1.753 m)   Wt 208 lb 1.3 oz (94.4 kg)   SpO2 94%   BMI 30.73 kg/m   Patient alert and oriented and in no cardiopulmonary distress.  HEENT: No facial asymmetry, EOMI,     Neck supple .  Chest: Clear to auscultation bilaterally.  CVS: S1, S2 no murmurs, no S3.Regular rate.  ABD: Soft non tender.   Ext: No edema  MS: Adequate ROM spine, shoulders, hips and knees.  Skin: Intact, no ulcerations or rash noted.  Psych: Good eye contact, normal affect. Memory intact not anxious or depressed appearing.  CNS: CN 2-12 intact, power,  normal throughout.no focal deficits noted.   Assessment & Plan  Essential  hypertension Controlled, no change in medication DASH diet and commitment to daily physical activity for a minimum of 30 minutes discussed and encouraged, as a part of hypertension management. The importance of attaining a healthy weight is also discussed.     11/25/2022   11:38 AM 11/21/2022    8:45 AM 09/21/2022    9:15 AM 09/14/2022    8:36 AM 09/04/2022   10:37 AM 08/22/2022    3:41 PM 07/25/2022    8:49 AM  BP/Weight  Systolic BP 116 118  115 125 108 120  Diastolic BP 70 73  64 71 68 68  Wt. (Lbs)  208.08 206 201.3 205.4    BMI  30.73 kg/m2 30.42 kg/m2 29.73 kg/m2 30.55 kg/m2       Controlled, no change in medication   Hyperlipidemia LDL goal <70 Hyperlipidemia:Low fat diet discussed and encouraged.   Lipid Panel  Lab Results  Component Value Date   CHOL 130 11/16/2022   HDL 46 11/16/2022   LDLCALC 65 11/16/2022   TRIG 101 11/16/2022   CHOLHDL 2.8 11/16/2022     Controlled, no change in medication   Prostate cancer (HCC) Will have radiation  treatment within next 4 weeks, has anxiety re dx, encouraged therapy through navigator in oncology, states he will be OK has great family and friend support   Type 2 diabetes mellitus with vascular disease [facility]) Robert [name] is reminded of the importance of commitment to daily physical activity for 30 minutes or more, as able  and the need to limit carbohydrate intake to 30 to 60 grams per meal to help with blood sugar control.   The need to take medication as prescribed, test blood sugar as directed, and to call between visits if there is a concern that blood sugar is uncontrolled is also discussed.   Robert [name] is reminded of the importance of daily foot exam, annual eye examination, and good blood sugar, blood pressure and cholesterol control.     Latest Ref Rng & Units 11/16/2022   10:20 AM 07/20/2022    9:24 AM 04/25/2022    4:01 AM 04/04/2022   10:30 AM 02/17/2022    1:06 PM  Diabetic Labs  HbA1c 4.8 - 5.6 % 7.1  6.7   7.0     Micro/Creat Ratio 0 - 29 mg/g creat     33   Chol 100 - 199 mg/dL 161       HDL >09 mg/dL 46       Calc LDL 0 - 99 mg/dL 65       Triglycerides 0 - 149 mg/dL 604       Creatinine 5.40 - 1.27 mg/dL 9.81  1.91  4.78  2.95        11/25/2022   11:38 AM 11/21/2022    8:45 AM 09/21/2022    9:15 AM 09/14/2022    8:36 AM 09/04/2022   10:37 AM 08/22/2022    3:41 PM 07/25/2022    8:49 AM  BP/Weight  Systolic BP 116 118  115 125 108 120  Diastolic BP 70 73  64 71 68 68  Wt. (Lbs)  208.08 206 201.3 205.4    BMI  30.73 kg/m2 30.42 kg/m2 29.73 kg/m2 30.55 kg/m2        Latest Ref Rng & Units 06/22/2022   12:00 AM 06/20/2021   12:00 AM  Foot/eye exam completion dates  Eye Exam No Retinopathy No Retinopathy     No Retinopathy         This result is from an external source.      Less well controlled, emotional stress the main trigger , will work on this  Sleep apnea in adult Consitent use of CPAP encouraged  ASCVD (arteriosclerotic cardiovascular disease) Asymptomatic and stable, followed annually by cardiology

## 2022-11-25 NOTE — Assessment & Plan Note (Signed)
Robert [name] is reminded of the importance of commitment to daily physical activity for 30 minutes or more, as able and the need to limit carbohydrate intake to 30 to 60 grams per meal to help with blood sugar control.   The need to take medication as prescribed, test blood sugar as directed, and to call between visits if there is a concern that blood sugar is uncontrolled is also discussed.   Robert [name] is reminded of the importance of daily foot exam, annual eye examination, and good blood sugar, blood pressure and cholesterol control.     Latest Ref Rng & Units 11/16/2022   10:20 AM 07/20/2022    9:24 AM 04/25/2022    4:01 AM 04/04/2022   10:30 AM 02/17/2022    1:06 PM  Diabetic Labs  HbA1c 4.8 - 5.6 % 7.1  6.7   7.0    Micro/Creat Ratio 0 - 29 mg/g creat     33   Chol 100 - 199 mg/dL 696       HDL >29 mg/dL 46       Calc LDL 0 - 99 mg/dL 65       Triglycerides 0 - 149 mg/dL 528       Creatinine 4.13 - 1.27 mg/dL 2.44  0.10  2.72  5.36        11/25/2022   11:38 AM 11/21/2022    8:45 AM 09/21/2022    9:15 AM 09/14/2022    8:36 AM 09/04/2022   10:37 AM 08/22/2022    3:41 PM 07/25/2022    8:49 AM  BP/Weight  Systolic BP 116 118  115 125 108 120  Diastolic BP 70 73  64 71 68 68  Wt. (Lbs)  208.08 206 201.3 205.4    BMI  30.73 kg/m2 30.42 kg/m2 29.73 kg/m2 30.55 kg/m2        Latest Ref Rng & Units 06/22/2022   12:00 AM 06/20/2021   12:00 AM  Foot/eye exam completion dates  Eye Exam No Retinopathy No Retinopathy     No Retinopathy         This result is from an external source.      Less well controlled, emotional stress the main trigger , will work on this

## 2022-11-25 NOTE — Assessment & Plan Note (Signed)
Consitent use of CPAP encouraged

## 2022-11-25 NOTE — Assessment & Plan Note (Signed)
Will have radiation  treatment within next 4 weeks, has anxiety re dx, encouraged therapy through navigator in oncology, states he will be OK has great family and friend support

## 2022-11-25 NOTE — Assessment & Plan Note (Signed)
Hyperlipidemia:Low fat diet discussed and encouraged.   Lipid Panel  Lab Results  Component Value Date   CHOL 130 11/16/2022   HDL 46 11/16/2022   LDLCALC 65 11/16/2022   TRIG 101 11/16/2022   CHOLHDL 2.8 11/16/2022     Controlled, no change in medication

## 2022-11-25 NOTE — Assessment & Plan Note (Signed)
Controlled, no change in medication DASH diet and commitment to daily physical activity for a minimum of 30 minutes discussed and encouraged, as a part of hypertension management. The importance of attaining a healthy weight is also discussed.     11/25/2022   11:38 AM 11/21/2022    8:45 AM 09/21/2022    9:15 AM 09/14/2022    8:36 AM 09/04/2022   10:37 AM 08/22/2022    3:41 PM 07/25/2022    8:49 AM  BP/Weight  Systolic BP 116 118  115 125 108 120  Diastolic BP 70 73  64 71 68 68  Wt. (Lbs)  208.08 206 201.3 205.4    BMI  30.73 kg/m2 30.42 kg/m2 29.73 kg/m2 30.55 kg/m2       Controlled, no change in medication

## 2022-11-27 ENCOUNTER — Encounter (INDEPENDENT_AMBULATORY_CARE_PROVIDER_SITE_OTHER): Payer: Self-pay | Admitting: *Deleted

## 2022-11-27 ENCOUNTER — Telehealth: Payer: Self-pay

## 2022-11-27 ENCOUNTER — Other Ambulatory Visit: Payer: Self-pay

## 2022-11-27 DIAGNOSIS — I10 Essential (primary) hypertension: Secondary | ICD-10-CM

## 2022-11-27 DIAGNOSIS — E1159 Type 2 diabetes mellitus with other circulatory complications: Secondary | ICD-10-CM

## 2022-11-27 NOTE — Progress Notes (Signed)
   Care Guide Note  11/27/2022 Name: Robert [name] MRN: [id number] DOB: June 22, 1950  Referred by: Kerri Perches, MD Reason for referral : Care Coordination (Outreach to schedule with pharm d )   Robert [name] is a 72 y.o. year old male who is a primary care patient of Kerri Perches, MD. Robert [name] was referred to the pharmacist for assistance related to DM.    Successful contact was made with the patient to discuss pharmacy services including being ready for the pharmacist to call at least 5 minutes before the scheduled appointment time, to have medication bottles and any blood sugar or blood pressure readings ready for review. The patient agreed to meet with the pharmacist via with the pharmacist via telephone visit on (date/time).  11/29/2022  Penne Lash, RMA Care Guide [facility]  [place], [place] [address] Direct Dial: [phone] [name]

## 2022-11-29 ENCOUNTER — Encounter: Payer: Self-pay | Admitting: *Deleted

## 2022-11-29 ENCOUNTER — Other Ambulatory Visit: Payer: PPO | Admitting: Pharmacist

## 2022-11-29 NOTE — Progress Notes (Unsigned)
Pt attended 11/25/22 screening event where his b/p was 116/70 and his blood sugar was 154. At the event, the pt did not note a PCP name and did not identify any SDOH insecurities. Chart review indicates pt sees Dr. Syliva Overman as his PCP from [facility], and pt last saw his PCP for an OV on 11/21/22. Pt has next future appt with Dr. Lodema Hong on 03/08/23. Pt also being followed by urology, oncology.In-basket message sent to PCP with event results.

## 2022-12-05 ENCOUNTER — Other Ambulatory Visit (HOSPITAL_COMMUNITY): Payer: Self-pay

## 2022-12-06 ENCOUNTER — Other Ambulatory Visit: Payer: Self-pay

## 2022-12-06 ENCOUNTER — Other Ambulatory Visit (HOSPITAL_COMMUNITY): Payer: Self-pay

## 2022-12-12 NOTE — Progress Notes (Signed)
11/27/2022 Name: Robert [name] MRN: [id number] DOB: [birth year]  Chief Complaint  Patient presents with   Diabetes    HASAAN [name] is a 72 y.o. year old male who presented for a telephone visit.   They were referred to the pharmacist by their PCP for assistance in managing medication access. Patient states he is stable on his current regimen, but would like to inquire about medication assistance programs for Trulicity.  He is currently in the coverage gap with Medicare and copays are high.  He has not qualified for medication assistance in the past.   Subjective:  Care Team: Primary Care Provider: Kerri Perches, MD  Medication Access/Adherence  Current Pharmacy:  [phone] - [place], [place] - [address] AT [facility] [address] [place] [address] Phone: [phone] Fax: [phone]  [facility] AP[facility] No address on file   [place] [facility] [address] [place] [address] Phone: [phone] Fax: [phone]   Diabetes:  Current medications: Trulicity, Lantus 40 units daily Medications tried in the past:   Current physical activity: encouraged as able  Current medication access support: n/a; HTA medicare   Objective:  Lab Results  Component Value Date   HGBA1C 7.1 (H) 11/16/2022    Lab Results  Component Value Date   CREATININE 1.25 11/16/2022   BUN 16 11/16/2022   NA 140 11/16/2022   K 4.5 11/16/2022   CL 103 11/16/2022   CO2 21 11/16/2022    Lab Results  Component Value Date   CHOL 130 11/16/2022   HDL 46 11/16/2022   LDLCALC 65 11/16/2022   TRIG 101 11/16/2022   CHOLHDL 2.8 11/16/2022    Medications Reviewed Today     Reviewed by Danella Maiers, [facility] (Pharmacist) on 12/12/22 at 1450  Med List Status: <None>   Medication Order Taking? Sig Documenting Provider Last Dose Status Informant  acetaminophen (TYLENOL) 650 MG CR tablet [id number] No Take 650  mg by mouth every 8 (eight) hours as needed for pain. [provider] Taking Active Self  aspirin EC 81 MG tablet [id number] No Take 81 mg by mouth daily. [provider] Taking Active   atorvastatin (LIPITOR) 40 MG tablet [id number] No Take 1 tablet (40 mg total) by mouth daily.  Patient taking differently: Take 40 mg by mouth at bedtime.   Antoine Poche, MD Taking Active Self  Continuous Glucose Receiver (FREESTYLE LIBRE 3 READER) DEVI [id number] No Use as directed. Kerri Perches, MD Taking Active   Continuous Glucose Sensor (FREESTYLE LIBRE 3 SENSOR) [place] [id number] No Place 1 sensor on the skin every 14 days. Use to check glucose continuously Kerri Perches, MD Taking Active   Dulaglutide (TRULICITY) 1.5 MG/0.5ML [facility] [id number] No Inject 1.5 mg into the skin once a week. Kerri Perches, MD Taking Active Self  insulin glargine (LANTUS SOLOSTAR) 100 UNIT/ML Solostar Pen [id number] No Inject 40 Units into the skin at bedtime. Kerri Perches, MD Taking Active Self           Med Note Robert [name]   Fri Jul 21, 2022 10:48 AM)    Insulin Pen Needle (TECHLITE PEN NEEDLES) 31G X 8 MM MISC [id number] No Use as directed once daily Kerri Perches, MD Taking Active   loratadine (CLARITIN) 10 MG tablet [id number] No Take 1 tablet (10 mg total) by mouth daily. Kerri Perches, MD Taking Active Self  metoprolol succinate (  TOPROL-XL) 50 MG 24 hr tablet [id number] No Take 1 tablet (50 mg total) by mouth daily with or immediately following a meal Branch, Robert Pea, MD Taking Active   nitroGLYCERIN (NITROSTAT) 0.4 MG SL tablet [id number] No Place 1 tablet (0.4 mg total) under the tongue every 5 (five) minutes as needed for chest pain. Ellsworth Lennox, PA-C Taking Active Self           Med Note Robert [name] Apr 24, 2022  9:40 AM) never  olmesartan (BENICAR) 20 MG tablet [id number] No Take 1 tablet (20 mg total) by mouth daily.  Patient taking  differently: Take 20 mg by mouth at bedtime.   Antoine Poche, MD Taking Active Self  omeprazole (PRILOSEC) 40 MG capsule [id number] No Take 1 capsule (40 mg total) by mouth daily.  Patient not taking: Reported on 11/21/2022   Robert Frame, MD Not Taking Active Self  sildenafil (VIAGRA) 100 MG tablet [id number] No Take 0.5-1 tablets (50-100 mg total) by mouth as needed. Robert Matar, MD Taking Active   Vitamin D, Ergocalciferol, (DRISDOL) 1.25 MG (50000 UNIT) CAPS capsule [id number]  Take 1 capsule (50,000 Units total) by mouth every 7 (seven) days. Kerri Perches, MD  Active               Assessment/Plan:   Diabetes: - Currently controlled, slightly above goal A1c of <7% - Reviewed long term cardiovascular and renal outcomes of uncontrolled blood sugar - Reviewed goal A1c, goal fasting, and goal 2 hour post prandial glucose - Recommend to continue current regimen  - Recommend to check glucose daily (fasting) or if symptomatic - Patient is above income for financial criteria for the [employer] patient assistance program (Trulicity).  Unfortunately, there is no assistance at this time.    Follow Up Plan: as needed  Robert [name], PharmD, BCACP Clinical Pharmacist, [facility]

## 2022-12-13 ENCOUNTER — Other Ambulatory Visit: Payer: Self-pay

## 2022-12-13 ENCOUNTER — Encounter (HOSPITAL_BASED_OUTPATIENT_CLINIC_OR_DEPARTMENT_OTHER): Payer: Self-pay | Admitting: Urology

## 2022-12-13 NOTE — Progress Notes (Signed)
Spoke w/ via phone for pre-op interview---pt Lab needs dos----   I stat            Lab results------EKG 04-18-2022 epic COVID test -----patient states asymptomatic no test needed Arrive at -------730 12-21-2022 NPO after MN NO Solid Food.  Clear liquids from MN until---630 Med rec completed Medications to take morning of surgery -----claritin Diabetic medication -----trulicity last dose 12-08-2022, take 1/2 dose at bedtime lantus night before surgery (take 15 units) Patient instructed no nail polish to be worn day of surgery Patient instructed to bring photo id and insurance card day of surgery Patient aware to have Driver (ride ) / caregiver   wife dianne  for 24 hours after surgery  Patient Special Instructions -----fleets enema am of surgery Pre-Op special Instructions -----pt stopped 81 mg asa 12-10-2022 (pt stopped 81 mg asa on own) Patient verbalized understanding of instructions that were given at this phone interview. Patient denies shortness of breath, chest pain, fever, cough at this phone interview.  Lov dr branch cardiology 07-17-2022 epic US carotid bilateral 02-22-2021 epic [facility] pulmonology 09-14-2022 Natalia Leatherwood kobb np epic

## 2022-12-20 ENCOUNTER — Telehealth: Payer: Self-pay | Admitting: *Deleted

## 2022-12-20 NOTE — H&P (Signed)
H&P  Chief Complaint: PCa  History of Present Illness: Robert [name] presents for I 125 brachytherapy/SpaceOAR for mgmt of GG 2 PCa.  Past Medical History:  Diagnosis Date   Arteriosclerotic cardiovascular disease (ASCVD)    DES to LAD D1 - 2003 in [place]; failed intervention for a totally obstructed RCA at that time; EF of 45%; 12/2009: Equivocal stress nuclear with good exercise tolerance, negative stress EKG, normal EF with mild mid and distal inferior ischemia   Arthritis    Coronary artery disease    Diabetes mellitus, insulin dependent (IDDM), controlled 10/25/2012   HBA1C is 6.9 on 10/22/2012, [employer]rees[employer] 04/26/2020   Diabetes mellitus, type II (HCC)    GERD (gastroesophageal reflux disease)    Hyperlipidemia    Lipid profile in 09/2011:128, 130, 33, 69   Hypertension    Lab  09/2011: Normal CMet ex G-133   Pneumonia    yrs ago   Prostate cancer (HCC)    Rash    right legt below knee using neosporin 2 to 3 x per day, area healing   Sleep apnea    no CPAP, can't tolerate   Tobacco abuse, in remission    30 pack years; quit in 1985   Wears glasses     Past Surgical History:  Procedure Laterality Date   BIOPSY  01/05/2022   Procedure: BIOPSY;  Surgeon: Marguerita Merles, Reuel Boom, MD;  [facility]: [facility];  Service: Gastroenterology;;   bone spur Left    foot big toe   CARDIAC CATHETERIZATION  2003   DES placed,  done in [place]   COLONOSCOPY  04/03/2008   Negative screening study   COLONOSCOPY N/A 11/11/2014   Procedure: COLONOSCOPY;  Surgeon: Malissa Hippo, MD;  Location: [facility];  Service: Endoscopy;  Laterality: N/A;  1030   COLONOSCOPY WITH PROPOFOL N/A 01/05/2022   Procedure: COLONOSCOPY WITH PROPOFOL;  Surgeon: Dolores Frame, MD;  Location: [facility];  Service: Gastroenterology;  Laterality: N/A;  730 ASA 2   EPIDIDYMIS SURGERY  04/03/1994   ESOPHAGOGASTRODUODENOSCOPY (EGD) WITH PROPOFOL N/A 01/05/2022   Procedure: ESOPHAGOGASTRODUODENOSCOPY  (EGD) WITH PROPOFOL;  Surgeon: Dolores Frame, MD;  [facility]: [facility];  Service: Gastroenterology;  Laterality: N/A;   HERNIA REPAIR     umbilical as a baby   KNEE ARTHROSCOPY W/ MENISCECTOMY  11/01/2008   Right   POLYPECTOMY  01/05/2022   Procedure: POLYPECTOMY;  Surgeon: Dolores Frame, MD;  [facility]: [facility];  Service: Gastroenterology;;   PRESSURE ULCER DEBRIDEMENT  04/03/2004   Right lower extremity   ROTATOR CUFF REPAIR  07/02/2009   Right   TOTAL KNEE ARTHROPLASTY Right 04/24/2022   Procedure: TOTAL KNEE ARTHROPLASTY;  Surgeon: Ollen Gross, MD;  Location: [facility];  Service: Orthopedics;  Laterality: Right;    Home Medications:  Allergies as of 12/20/2022       Reactions   Metformin And Related Other (See Comments)   Stomach upset   Oxycodone    "Makes me foolish, did not help with pain"   Rosuvastatin Other (See Comments)   Adverse GI symptoms   Codeine    "Did not work" and "made me feel foolish"        Medication List      Notice   Cannot display discharge medications because the patient has not yet been admitted.     Allergies:  Allergies  Allergen Reactions   Metformin And Related Other (See Comments)    Stomach upset  Oxycodone     "Makes me foolish, did not help with pain"   Rosuvastatin Other (See Comments)    Adverse GI symptoms   Codeine     "Did not work" and "made me feel foolish"    Family History  Problem Relation Age of Onset   Diabetes Sister    Arthritis Brother    Diabetes Brother    Arthritis Mother    Cancer Father        Hypernephroma    Social History:  reports that he quit smoking about 39 years ago. His smoking use included cigarettes. He started smoking about 56 years ago. He has a 30 pack-year smoking history. He has never used smokeless tobacco. He reports that he does not drink alcohol and does not use drugs.  ROS: A complete review of systems was performed.  All systems are negative  except for pertinent findings as noted.  Physical Exam:  Vital signs in last 24 hours: Ht 5\' 9"  (1.753 m)   Wt 94.8 kg   BMI 30.86 kg/m  Constitutional:  Alert and oriented, No acute distress Cardiovascular: Regular rate  Respiratory: Normal respiratory effort Neurologic: Grossly intact, no focal deficits Psychiatric: Normal mood and affect  I have reviewed prior pt notes  I have reviewed urinalysis results  I have independently reviewed prior imaging  I have reviewed prior PSA & pathology results    Impression/Assessment:  Grade group 2 PCa  Plan:  I 125 brachytherapy/SpaceOAR placement

## 2022-12-20 NOTE — Telephone Encounter (Signed)
Called patient to remind of procedure for 12-21-22, spoke with patient and he is aware of this procedure

## 2022-12-21 ENCOUNTER — Encounter (HOSPITAL_BASED_OUTPATIENT_CLINIC_OR_DEPARTMENT_OTHER)
Admission: RE | Disposition: A | Discharge status: Home / Self Care | Payer: Self-pay | Source: Home / Self Care | Attending: Urology

## 2022-12-21 ENCOUNTER — Encounter (HOSPITAL_BASED_OUTPATIENT_CLINIC_OR_DEPARTMENT_OTHER): Payer: Self-pay | Admitting: Urology

## 2022-12-21 ENCOUNTER — Ambulatory Visit (HOSPITAL_BASED_OUTPATIENT_CLINIC_OR_DEPARTMENT_OTHER)
Admission: RE | Admit: 2022-12-21 | Discharge: 2022-12-21 | Disposition: A | Discharge status: Home / Self Care | Payer: PPO | Attending: Urology | Admitting: Urology

## 2022-12-21 ENCOUNTER — Ambulatory Visit (HOSPITAL_BASED_OUTPATIENT_CLINIC_OR_DEPARTMENT_OTHER): Payer: PPO | Admitting: Anesthesiology

## 2022-12-21 ENCOUNTER — Other Ambulatory Visit: Payer: Self-pay

## 2022-12-21 ENCOUNTER — Ambulatory Visit (HOSPITAL_COMMUNITY): Payer: PPO

## 2022-12-21 DIAGNOSIS — D075 Carcinoma in situ of prostate: Secondary | ICD-10-CM | POA: Diagnosis not present

## 2022-12-21 DIAGNOSIS — M1990 Unspecified osteoarthritis, unspecified site: Secondary | ICD-10-CM | POA: Diagnosis not present

## 2022-12-21 DIAGNOSIS — I2510 Atherosclerotic heart disease of native coronary artery without angina pectoris: Secondary | ICD-10-CM | POA: Insufficient documentation

## 2022-12-21 DIAGNOSIS — Z955 Presence of coronary angioplasty implant and graft: Secondary | ICD-10-CM | POA: Insufficient documentation

## 2022-12-21 DIAGNOSIS — E119 Type 2 diabetes mellitus without complications: Secondary | ICD-10-CM | POA: Insufficient documentation

## 2022-12-21 DIAGNOSIS — I10 Essential (primary) hypertension: Secondary | ICD-10-CM | POA: Insufficient documentation

## 2022-12-21 DIAGNOSIS — C61 Malignant neoplasm of prostate: Secondary | ICD-10-CM | POA: Diagnosis not present

## 2022-12-21 DIAGNOSIS — G4730 Sleep apnea, unspecified: Secondary | ICD-10-CM | POA: Diagnosis not present

## 2022-12-21 DIAGNOSIS — G4733 Obstructive sleep apnea (adult) (pediatric): Secondary | ICD-10-CM | POA: Diagnosis not present

## 2022-12-21 DIAGNOSIS — E1151 Type 2 diabetes mellitus with diabetic peripheral angiopathy without gangrene: Secondary | ICD-10-CM | POA: Diagnosis not present

## 2022-12-21 DIAGNOSIS — Z01818 Encounter for other preprocedural examination: Secondary | ICD-10-CM

## 2022-12-21 DIAGNOSIS — N289 Disorder of kidney and ureter, unspecified: Secondary | ICD-10-CM | POA: Diagnosis not present

## 2022-12-21 DIAGNOSIS — Z87891 Personal history of nicotine dependence: Secondary | ICD-10-CM | POA: Diagnosis not present

## 2022-12-21 DIAGNOSIS — Z191 Hormone sensitive malignancy status: Secondary | ICD-10-CM | POA: Diagnosis not present

## 2022-12-21 HISTORY — PX: RADIOACTIVE SEED IMPLANT: SHX5150

## 2022-12-21 HISTORY — DX: Malignant neoplasm of prostate: C61

## 2022-12-21 HISTORY — PX: SPACE OAR INSTILLATION: SHX6769

## 2022-12-21 HISTORY — DX: Gastro-esophageal reflux disease without esophagitis: K21.9

## 2022-12-21 HISTORY — DX: Presence of spectacles and contact lenses: Z97.3

## 2022-12-21 HISTORY — DX: Rash and other nonspecific skin eruption: R21

## 2022-12-21 LAB — GLUCOSE, CAPILLARY: Glucose-Capillary: 116 mg/dL — ABNORMAL HIGH (ref 70–99)

## 2022-12-21 LAB — POCT I-STAT, CHEM 8
BUN: 19 mg/dL (ref 8–23)
Calcium, Ion: 1.21 mmol/L (ref 1.15–1.40)
Chloride: 102 mmol/L (ref 98–111)
Creatinine, Ser: 1.4 mg/dL — ABNORMAL HIGH (ref 0.61–1.24)
Glucose, Bld: 141 mg/dL — ABNORMAL HIGH (ref 70–99)
HCT: 43 % (ref 39.0–52.0)
Hemoglobin: 14.6 g/dL (ref 13.0–17.0)
Potassium: 4.3 mmol/L (ref 3.5–5.1)
Sodium: 139 mmol/L (ref 135–145)
TCO2: 24 mmol/L (ref 22–32)

## 2022-12-21 SURGERY — INSERTION, RADIATION SOURCE, PROSTATE
Anesthesia: General

## 2022-12-21 MED ORDER — ROCURONIUM BROMIDE 10 MG/ML (PF) SYRINGE
PREFILLED_SYRINGE | INTRAVENOUS | Status: AC
  Filled 2022-12-21: qty 10

## 2022-12-21 MED ORDER — CEFAZOLIN SODIUM-DEXTROSE 2-4 GM/100ML-% IV SOLN
2.0000 g | INTRAVENOUS | Status: AC
  Administered 2022-12-21: 2 g via INTRAVENOUS

## 2022-12-21 MED ORDER — AMISULPRIDE (ANTIEMETIC) 5 MG/2ML IV SOLN: 10.0000 mg | Freq: Once | INTRAVENOUS | Status: DC | PRN

## 2022-12-21 MED ORDER — SODIUM CHLORIDE (PF) 0.9 % IJ SOLN
INTRAMUSCULAR | Status: DC | PRN
  Administered 2022-12-21: 10 mL via INTRAVENOUS

## 2022-12-21 MED ORDER — SUGAMMADEX SODIUM 200 MG/2ML IV SOLN
INTRAVENOUS | Status: DC | PRN
Start: 2022-12-21 — End: 2022-12-21
  Administered 2022-12-21: 200 mg via INTRAVENOUS

## 2022-12-21 MED ORDER — FENTANYL CITRATE (PF) 100 MCG/2ML IJ SOLN
INTRAMUSCULAR | Status: DC | PRN
  Administered 2022-12-21 (×2): 25 ug via INTRAVENOUS
  Administered 2022-12-21: 50 ug via INTRAVENOUS
  Administered 2022-12-21: 25 ug via INTRAVENOUS
  Administered 2022-12-21: 50 ug via INTRAVENOUS
  Administered 2022-12-21: 25 ug via INTRAVENOUS

## 2022-12-21 MED ORDER — SODIUM CHLORIDE 0.9 % IR SOLN
Status: DC | PRN
  Administered 2022-12-21: 1000 mL via INTRAVESICAL

## 2022-12-21 MED ORDER — CEFAZOLIN SODIUM-DEXTROSE 2-4 GM/100ML-% IV SOLN
INTRAVENOUS | Status: AC
  Filled 2022-12-21: qty 100

## 2022-12-21 MED ORDER — PROPOFOL 10 MG/ML IV BOLUS
INTRAVENOUS | Status: AC
  Filled 2022-12-21: qty 20

## 2022-12-21 MED ORDER — MIDAZOLAM HCL 5 MG/5ML IJ SOLN
INTRAMUSCULAR | Status: DC | PRN
  Administered 2022-12-21: 2 mg via INTRAVENOUS

## 2022-12-21 MED ORDER — EPHEDRINE SULFATE (PRESSORS) 50 MG/ML IJ SOLN
INTRAMUSCULAR | Status: DC | PRN
  Administered 2022-12-21: 10 mg via INTRAVENOUS

## 2022-12-21 MED ORDER — FENTANYL CITRATE (PF) 100 MCG/2ML IJ SOLN
INTRAMUSCULAR | Status: AC
  Filled 2022-12-21: qty 2

## 2022-12-21 MED ORDER — LIDOCAINE HCL (CARDIAC) PF 100 MG/5ML IV SOSY
PREFILLED_SYRINGE | INTRAVENOUS | Status: DC | PRN
  Administered 2022-12-21: 100 mg via INTRAVENOUS

## 2022-12-21 MED ORDER — PROPOFOL 10 MG/ML IV BOLUS
INTRAVENOUS | Status: DC | PRN
Start: 2022-12-21 — End: 2022-12-21
  Administered 2022-12-21: 200 mg via INTRAVENOUS

## 2022-12-21 MED ORDER — ACETAMINOPHEN 500 MG PO TABS
1000.0000 mg | ORAL_TABLET | Freq: Once | ORAL | Status: AC
  Administered 2022-12-21: 1000 mg via ORAL

## 2022-12-21 MED ORDER — DEXAMETHASONE SODIUM PHOSPHATE 10 MG/ML IJ SOLN
INTRAMUSCULAR | Status: AC
  Filled 2022-12-21: qty 1

## 2022-12-21 MED ORDER — LIDOCAINE HCL (PF) 2 % IJ SOLN
INTRAMUSCULAR | Status: AC
  Filled 2022-12-21: qty 5

## 2022-12-21 MED ORDER — FENTANYL CITRATE (PF) 100 MCG/2ML IJ SOLN: 25.0000 ug | INTRAMUSCULAR | Status: DC | PRN

## 2022-12-21 MED ORDER — EPHEDRINE 5 MG/ML INJ
INTRAVENOUS | Status: AC
  Filled 2022-12-21: qty 5

## 2022-12-21 MED ORDER — ONDANSETRON HCL 4 MG/2ML IJ SOLN
INTRAMUSCULAR | Status: DC | PRN
  Administered 2022-12-21: 4 mg via INTRAVENOUS

## 2022-12-21 MED ORDER — PHENYLEPHRINE HCL (PRESSORS) 10 MG/ML IV SOLN
INTRAVENOUS | Status: DC | PRN
  Administered 2022-12-21 (×5): 80 ug via INTRAVENOUS

## 2022-12-21 MED ORDER — STERILE WATER FOR IRRIGATION IR SOLN
Status: DC | PRN
Start: 2022-12-21 — End: 2022-12-21
  Administered 2022-12-21: 500 mL

## 2022-12-21 MED ORDER — IOHEXOL 300 MG/ML  SOLN
INTRAMUSCULAR | Status: DC | PRN
  Administered 2022-12-21: 7 mL

## 2022-12-21 MED ORDER — MIDAZOLAM HCL 2 MG/2ML IJ SOLN
INTRAMUSCULAR | Status: AC
  Filled 2022-12-21: qty 2

## 2022-12-21 MED ORDER — ONDANSETRON HCL 4 MG/2ML IJ SOLN
INTRAMUSCULAR | Status: AC
  Filled 2022-12-21: qty 2

## 2022-12-21 MED ORDER — ROCURONIUM BROMIDE 100 MG/10ML IV SOLN
INTRAVENOUS | Status: DC | PRN
  Administered 2022-12-21: 10 mg via INTRAVENOUS
  Administered 2022-12-21: 60 mg via INTRAVENOUS

## 2022-12-21 MED ORDER — ACETAMINOPHEN 500 MG PO TABS
ORAL_TABLET | ORAL | Status: AC
  Filled 2022-12-21: qty 2

## 2022-12-21 MED ORDER — LACTATED RINGERS IV SOLN: INTRAVENOUS | Status: DC

## 2022-12-21 MED ORDER — PHENYLEPHRINE 80 MCG/ML (10ML) SYRINGE FOR IV PUSH (FOR BLOOD PRESSURE SUPPORT)
PREFILLED_SYRINGE | INTRAVENOUS | Status: AC
  Filled 2022-12-21: qty 10

## 2022-12-21 SURGICAL SUPPLY — 46 items
BAG DRN RND TRDRP ANRFLXCHMBR (UROLOGICAL SUPPLIES) ×1
BAG URINE DRAIN 2000ML AR STRL (UROLOGICAL SUPPLIES) ×1 IMPLANT
BLADE CLIPPER SENSICLIP SURGIC (BLADE) ×1 IMPLANT
BLANKET WARM UPPER BOD BAIR (MISCELLANEOUS) ×1 IMPLANT
CATH FOLEY 2WAY SLVR 5CC 16FR (CATHETERS) ×1 IMPLANT
CATH ROBINSON RED A/P 16FR (CATHETERS) IMPLANT
CATH ROBINSON RED A/P 20FR (CATHETERS) ×1 IMPLANT
CLOTH BEACON ORANGE TIMEOUT ST (SAFETY) ×1 IMPLANT
CNTNR URN SCR LID CUP LEK RST (MISCELLANEOUS) ×1 IMPLANT
CONT SPEC 4OZ STRL OR WHT (MISCELLANEOUS) ×1
COVER BACK TABLE 60X90IN (DRAPES) ×1 IMPLANT
COVER MAYO STAND STRL (DRAPES) ×1 IMPLANT
DRSG TEGADERM 4X4.75 (GAUZE/BANDAGES/DRESSINGS) ×1 IMPLANT
DRSG TEGADERM 8X12 (GAUZE/BANDAGES/DRESSINGS) ×1 IMPLANT
GAUZE SPONGE 4X4 3PLY NS LF (GAUZE/BANDAGES/DRESSINGS) IMPLANT
GEL ULTRASOUND 20GR AQUASONIC (MISCELLANEOUS) ×2 IMPLANT
GLOVE BIO SURGEON STRL SZ 6.5 (GLOVE) IMPLANT
GLOVE BIO SURGEON STRL SZ7.5 (GLOVE) IMPLANT
GLOVE BIO SURGEON STRL SZ8 (GLOVE) ×2 IMPLANT
GLOVE BIOGEL PI IND STRL 6.5 (GLOVE) IMPLANT
GLOVE SURG ORTHO 8.5 STRL (GLOVE) ×1 IMPLANT
GOWN STRL REUS W/TWL XL LVL3 (GOWN DISPOSABLE) ×1 IMPLANT
GRID BRACH TEMP 18GA 2.8X3X.75 (MISCELLANEOUS) ×1 IMPLANT
HOLDER FOLEY CATH W/STRAP (MISCELLANEOUS) ×1 IMPLANT
IMPL SPACEOAR VUE SYSTEM (Spacer) ×1 IMPLANT
IMPLANT SPACEOAR VUE SYSTEM (Spacer) ×1 IMPLANT
IV NS 1000ML (IV SOLUTION) ×1
IV NS 1000ML BAXH (IV SOLUTION) ×1 IMPLANT
KIT TURNOVER CYSTO (KITS) ×1 IMPLANT
NDL BRACHY 18G 5PK (NEEDLE) ×4 IMPLANT
NDL BRACHY 18G SINGLE (NEEDLE) IMPLANT
NDL PK MORGANSTERN STABILIZ (NEEDLE) ×1 IMPLANT
NEEDLE BRACHY 18G 5PK (NEEDLE) ×4
NEEDLE BRACHY 18G SINGLE (NEEDLE)
NEEDLE PK MORGANSTERN STABILIZ (NEEDLE) ×1
PACK CYSTO (CUSTOM PROCEDURE TRAY) ×1 IMPLANT
Quicklink seeds IMPLANT
SHEATH ULTRASOUND LF (SHEATH) IMPLANT
SHEATH ULTRASOUND LTX NONSTRL (SHEATH) IMPLANT
SLEEVE SCD COMPRESS KNEE MED (STOCKING) ×1 IMPLANT
SUT BONE WAX W31G (SUTURE) IMPLANT
SYR 10ML LL (SYRINGE) ×1 IMPLANT
SYR CONTROL 10ML LL (SYRINGE) ×1 IMPLANT
TOWEL OR 17X24 6PK STRL BLUE (TOWEL DISPOSABLE) ×1 IMPLANT
UNDERPAD 30X36 HEAVY ABSORB (UNDERPADS AND DIAPERS) ×2 IMPLANT
WATER STERILE IRR 500ML POUR (IV SOLUTION) ×1 IMPLANT

## 2022-12-21 NOTE — Anesthesia Procedure Notes (Signed)
Procedure Name: Intubation Date/Time: 12/21/2022 9:53 AM  Performed by: Jessica Priest, CRNAPre-anesthesia Checklist: Patient identified, Emergency Drugs available, Suction available, Patient being monitored and Timeout performed Patient Re-evaluated:Patient Re-evaluated prior to induction Oxygen Delivery Method: Circle system utilized Preoxygenation: Pre-oxygenation with 100% oxygen Induction Type: IV induction Ventilation: Mask ventilation without difficulty Laryngoscope Size: Mac and 4 Grade View: Grade III Tube type: Oral Tube size: 7.5 mm Number of attempts: 1 Airway Equipment and Method: Stylet and Oral airway Placement Confirmation: ETT inserted through vocal cords under direct vision, positive ETCO2, breath sounds checked- equal and bilateral and CO2 detector Secured at: 22 cm Tube secured with: Tape Dental Injury: Teeth and Oropharynx as per pre-operative assessment

## 2022-12-21 NOTE — Transfer of Care (Signed)
Immediate Anesthesia Transfer of Care Note  Patient: Robert [name]  Procedure(s) Performed: Procedure(s) (LRB): RADIOACTIVE SEED IMPLANT/BRACHYTHERAPY IMPLANT (N/A) SPACE OAR INSTILLATION (N/A)  Patient Location: PACU  Anesthesia Type: GA  Level of Consciousness: awake, sedated, patient cooperative and responds to stimulation  Airway & Oxygen Therapy: Patient Spontanous Breathing and Patient connected to [place] oxygen  Post-op Assessment: Report given to PACU RN, Post -op Vital signs reviewed and stable and Patient moving all extremities  Post vital signs: Reviewed and stable  Complications: No apparent anesthesia complications

## 2022-12-21 NOTE — Anesthesia Postprocedure Evaluation (Signed)
Anesthesia Post Note  Patient: Robert [name]  Procedure(s) Performed: RADIOACTIVE SEED IMPLANT/BRACHYTHERAPY IMPLANT SPACE OAR INSTILLATION     Patient location during evaluation: PACU Anesthesia Type: General Level of consciousness: awake and alert Pain management: pain level controlled Vital Signs Assessment: post-procedure vital signs reviewed and stable Respiratory status: spontaneous breathing, nonlabored ventilation, respiratory function stable and patient connected to nasal cannula oxygen Cardiovascular status: blood pressure returned to baseline and stable Postop Assessment: no apparent nausea or vomiting Anesthetic complications: no  No notable events documented.  Last Vitals:  Vitals:   12/21/22 1145 12/21/22 1151  BP: 121/69 131/69  Pulse: 77 71  Resp: (!) 22 16  Temp:  36.4 C  SpO2: 96% 93%    Last Pain:  Vitals:   12/21/22 1142  TempSrc:   PainSc: 3                  Kennieth Rad

## 2022-12-21 NOTE — Anesthesia Preprocedure Evaluation (Signed)
Anesthesia Evaluation  Patient identified by MRN, date of birth, ID band Patient awake    Reviewed: Allergy & Precautions, NPO status , Patient's Chart, lab work & pertinent test results  Airway Mallampati: II  TM Distance: >3 FB Neck ROM: Full    Dental  (+) Dental Advisory Given   Pulmonary sleep apnea , former smoker   breath sounds clear to auscultation       Cardiovascular hypertension, Pt. on medications and Pt. on home beta blockers + CAD and + Cardiac Stents   Rhythm:Regular Rate:Normal     Neuro/Psych negative neurological ROS     GI/Hepatic Neg liver ROS,,,  Endo/Other  diabetes, Type 2, Insulin Dependent    Renal/GU Renal InsufficiencyRenal disease     Musculoskeletal  (+) Arthritis ,    Abdominal   Peds  Hematology negative hematology ROS (+)   Anesthesia Other Findings   Reproductive/Obstetrics                             Anesthesia Physical Anesthesia Plan  ASA: 3  Anesthesia Plan: General   Post-op Pain Management: Tylenol PO (pre-op)*   Induction: Intravenous  PONV Risk Score and Plan: 2 and Dexamethasone, Ondansetron and Treatment may vary due to age or medical condition  Airway Management Planned: Oral ETT  Additional Equipment: None  Intra-op Plan:   Post-operative Plan: Extubation in OR  Informed Consent: I have reviewed the patients History and Physical, chart, labs and discussed the procedure including the risks, benefits and alternatives for the proposed anesthesia with the patient or authorized representative who has indicated his/her understanding and acceptance.     Dental advisory given  Plan Discussed with: CRNA  Anesthesia Plan Comments:        Anesthesia Quick Evaluation

## 2022-12-21 NOTE — Interval H&P Note (Signed)
History and Physical Interval Note:  12/21/2022 9:00 AM  Robert [name]  has presented today for surgery, with the diagnosis of prostate cancer.  The various methods of treatment have been discussed with the patient and family. After consideration of risks, benefits and other options for treatment, the patient has consented to  Procedure(s): RADIOACTIVE SEED IMPLANT/BRACHYTHERAPY IMPLANT (N/A) SPACE OAR INSTILLATION (N/A) as a surgical intervention.  The patient's history has been reviewed, patient examined, no change in status, stable for surgery.  I have reviewed the patient's chart and labs.  Questions were answered to the patient's satisfaction.     Bertram Millard Jonah Nestle

## 2022-12-21 NOTE — Progress Notes (Signed)
Radiation Oncology         [phone] ________________________________  Name: Robert [name] MRN: [id number]  Date: 12/21/2022  DOB: 03-02-1951       Prostate Seed Implant  WU:[id number], Robert Mallick, MD  No ref. provider found  DIAGNOSIS:   72 y.o. gentleman with Stage T1c adenocarcinoma of the prostate with Gleason score of 3+4, and PSA of 4.3.   Oncology History  Prostate cancer (HCC)  07/11/2022 Cancer Staging   Staging form: Prostate, AJCC 8th Edition - Clinical stage from 07/11/2022: Stage IIB (cT1c, cN0, cM0, PSA: 4.3, Grade Group: 2) - Signed by Marcello Fennel, PA-C on 09/20/2022 Histopathologic type: Adenocarcinoma, NOS Stage prefix: Initial diagnosis Prostate specific antigen (PSA) range: Less than 10 Gleason primary pattern: 3 Gleason secondary pattern: 4 Gleason score: 7 Histologic grading system: 5 grade system Number of biopsy cores examined: 12 Number of biopsy cores positive: 6 Location of positive needle core biopsies: Both sides   07/24/2022 Initial Diagnosis   Prostate cancer (HCC)       ICD-10-CM   1. Pre-op testing  Z01.818 CBG per Guidelines for Diabetes Management for Patients Undergoing Surgery ([facility], [facility], and [facility] only)    CBG per protocol    I-Stat, Chem 8 on day of surgery per protocol    CBG per Guidelines for Diabetes Management for Patients Undergoing Surgery ([facility], [facility], and [facility] only)    CBG per protocol    I-Stat, Chem 8 on day of surgery per protocol      PROCEDURE: Insertion of radioactive I-125 seeds into the prostate gland.  RADIATION DOSE: 145 Gy, definitive therapy.  TECHNIQUE: Robert [name] was brought to the operating room with the urologist. He was placed in the dorsolithotomy position. He was catheterized and a rectal tube was inserted. The perineum was shaved, prepped and draped. The ultrasound probe was then introduced by me into the rectum to see the prostate gland.  TREATMENT DEVICE: I attached the needle grid to the ultrasound probe  stand and anchor needles were placed.  3D PLANNING: The prostate was imaged in 3D using a sagittal sweep of the prostate probe. These images were transferred to the planning computer. There, the prostate, urethra and rectum were defined on each axial reconstructed image. Then, the software created an optimized 3D plan and a few seed positions were adjusted. The quality of the plan was reviewed using [facility] information for the target and the following two organs at risk:  Urethra and Rectum.  Then the accepted plan was printed and handed off to the radiation therapist.  Under my supervision, the custom loading of the seeds and spacers was carried out using the quick loader.  These pre-loaded needles were then placed into the needle holder.Marland Kitchen  PROSTATE VOLUME STUDY:  Using transrectal ultrasound the volume of the prostate was verified to be 48 cc.  SPECIAL TREATMENT PROCEDURE/SUPERVISION AND HANDLING: The pre-loaded needles were then delivered by the urologist under sagittal guidance. A total of 19 needles were used to deposit 69 seeds in the prostate gland. The individual seed activity was 0.468 mCi.  SpaceOAR:  Yes  COMPLEX SIMULATION: At the end of the procedure, an anterior radiograph of the pelvis was obtained to document seed positioning and count. Cystoscopy was performed by the urologist to check the urethra and bladder.  MICRODOSIMETRY: At the end of the procedure, the patient was emitting 0.1 mR/hr at 1 meter. Accordingly, he was considered safe for hospital discharge.  PLAN: The patient will  return to the radiation oncology clinic for post implant CT dosimetry in three weeks.   ________________________________  Robert [name], M.D.

## 2022-12-21 NOTE — Discharge Instructions (Addendum)
Radioactive Seed Implant Home Care Instructions   Activity:    Rest for the remainder of the day.  Do not drive or operate equipment today.  You may resume normal  activities in a few days as instructed by your physician, without risk of harmful radiation exposure to those around you, provided you follow the time and distance precautions on the Radiation Oncology Instruction Sheet.   Meals: Drink plenty of lipuids and eat light foods, such as gelatin or soup this evening .  You may return to normal meal plan tomorrow.  Return To Work: You may return to work as instructed by Designer, multimedia.  Special Instruction:   If any seeds are found, use tweezers to pick up seeds and place in a glass container of any kind and bring to your physician's office.  Call your physician if any of these symptoms occur:   Persistent or heavy bleeding  Urine stream diminishes or stops completely after catheter is removed  Fever equal to or greater than 101 degrees F  Cloudy urine with a strong foul odor  Severe pain  You may feel some burning pain and/or hesitancy when you urinate after the catheter is removed.  These symptoms may increase over the next few weeks, but should diminish within forur to six weeks.  Applying moist heat to the lower abdomen or a hot tub bath may help relieve the pain.  If the discomfort becomes severe, please call your physician for additional medications.   Post Anesthesia Home Care Instructions  Activity: Get plenty of rest for the remainder of the day. A responsible individual must stay with you for 24 hours following the procedure.  For the next 24 hours, DO NOT: -Drive a car -Advertising copywriter -Drink alcoholic beverages -Take any medication unless instructed by your physician -Make any legal decisions or sign important papers.  Meals: Start with liquid foods such as gelatin or soup. Progress to regular foods as tolerated. Avoid greasy, spicy, heavy foods. If nausea  and/or vomiting occur, drink only clear liquids until the nausea and/or vomiting subsides. Call your physician if vomiting continues.  Special Instructions/Symptoms: Your throat may feel dry or sore from the anesthesia or the breathing tube placed in your throat during surgery. If this causes discomfort, gargle with warm salt water. The discomfort should disappear within 24 hours.

## 2022-12-21 NOTE — Op Note (Signed)
Preoperative diagnosis: Clinical stage TI C adenocarcinoma the prostate   Postoperative diagnosis: Same   Procedure: I-125 prostate seed implantation, SpaceOAR placement, flexible cystoscopy  Surgeon: Bertram Millard. Joanell Cressler M.D.  Radiation Oncologist: Margaretmary Dys, M.D.  Anesthesia: Gen.   Indications: Patient  was diagnosed with clinical stage TI C prostate cancer. We had extensive discussion with him about treatment options versus. He elected to proceed with seed implantation. He underwent consultation my office as well as with Dr. Kathrynn Running. He appeared to understand the advantages disadvantages potential risks of this treatment option. Full informed consent has been obtained.   Technique and findings: Patient was brought the operating room where he had successful induction of general anesthesia. He was placed in dorso-lithotomy position and prepped and draped in usual manner. Appropriate surgical timeout was performed. Radiation oncology department placed a transrectal ultrasound probe anchoring stand. Foley catheter with contrast in the balloon was inserted without difficulty. Anchoring needles were placed within the prostate. Rectal tube was placed. Real-time contouring of the urethra prostate and rectum were performed and the dosing parameters were established. Targeted dose was 145 gray.  I was then called  to the operating suite suite for placement of the needles. A second timeout was performed. All needle passage was done with real-time transrectal ultrasound guidance with the sagittal plane. A total of 19 needles were placed.  69 active seeds were implanted.  I then proceeded with placement of SpaceOAR by introducing a needle with the bevel angled inferiorly approximately 2 cm superior to the anus. This was angled downward and under direct ultrasound was placed within the space between the prostatic capsule and rectum. This was confirmed with a small amount of sterile saline injected and  this was performed under direct ultrasound. I then attached the SpaceOAR to the needle and injected this in the space between the prostate and rectum with good placement noted. The Foley catheter was removed and flexible cystoscopy failed to show any seeds outside the prostate.  The patient was brought to recovery room in stable condition, having tolerated the procedure well.Marland Kitchen

## 2022-12-22 ENCOUNTER — Encounter (HOSPITAL_BASED_OUTPATIENT_CLINIC_OR_DEPARTMENT_OTHER): Payer: Self-pay | Admitting: Urology

## 2022-12-27 ENCOUNTER — Encounter (INDEPENDENT_AMBULATORY_CARE_PROVIDER_SITE_OTHER): Payer: Self-pay | Admitting: *Deleted

## 2022-12-31 ENCOUNTER — Other Ambulatory Visit (HOSPITAL_COMMUNITY): Payer: Self-pay

## 2023-01-03 ENCOUNTER — Other Ambulatory Visit: Payer: Self-pay | Admitting: Family Medicine

## 2023-01-03 ENCOUNTER — Telehealth (INDEPENDENT_AMBULATORY_CARE_PROVIDER_SITE_OTHER): Payer: Self-pay | Admitting: Gastroenterology

## 2023-01-03 NOTE — Telephone Encounter (Signed)
Room 1 , Trulicity per protocol Thanks

## 2023-01-03 NOTE — Telephone Encounter (Signed)
Who is your primary care physician: Dr.Margaret Lodema Hong  Reasons for the EGD: 1 year recall  Are you diabetic? If yes, Type 1 or Type 2?   Type 2  Do you have a prosthetic or mechanical heart valve? no  Do you have a pacemaker/defibrillator?   no  Have you had endocarditis/atrial fibrillation? no  Have you had joint replacement within the last 12 months?  Yes right knee  Do you have any history of drugs or alchohol?  no  Do you use supplemental oxygen?  no  Have you had a stroke or heart attack within the last 6 months? no  Do you take weight loss medication?  Yes Trulicity  Do you take any blood-thinning medications such as: (aspirin, warfarin, Plavix, Aggrenox)  81 mg ASA  If yes we need the name, milligram, dosage and who is prescribing doctor  Current Outpatient Medications on File Prior to Visit  Medication Sig Dispense Refill   acetaminophen (TYLENOL) 650 MG CR tablet Take 650 mg by mouth every 8 (eight) hours as needed for pain.     aspirin EC 81 MG tablet Take 81 mg by mouth daily.     atorvastatin (LIPITOR) 40 MG tablet Take 1 tablet (40 mg total) by mouth daily. (Patient taking differently: Take 40 mg by mouth at bedtime.) 90 tablet 3   Continuous Glucose Receiver (FREESTYLE LIBRE 3 READER) DEVI Use as directed. 1 each 0   Continuous Glucose Sensor (FREESTYLE LIBRE 3 SENSOR) MISC Place 1 sensor on the skin every 14 days. Use to check glucose continuously 6 each 3   Dulaglutide (TRULICITY) 1.5 MG/0.5ML SOPN Inject 1.5 mg into the skin once a week. (Patient taking differently: Inject 1.5 mg into the skin once a week. friday) 6 mL 3   insulin glargine (LANTUS SOLOSTAR) 100 UNIT/ML Solostar Pen Inject 40 Units into the skin at bedtime. (Patient taking differently: Inject 30 Units into the skin at bedtime.) 45 mL 3   Insulin Pen Needle (TECHLITE PEN NEEDLES) 31G X 8 MM MISC Use as directed once daily 100 each 0   loratadine (CLARITIN) 10 MG tablet Take 1 tablet (10 mg total) by  mouth daily. 30 tablet 1   metoprolol succinate (TOPROL-XL) 50 MG 24 hr tablet Take 1 tablet (50 mg total) by mouth daily with or immediately following a meal (Patient taking differently: Take 50 mg by mouth at bedtime.) 90 tablet 3   neomycin-bacitracin-polymyxin (NEOSPORIN) OINT Apply 1 Application topically 3 (three) times daily. To rash below right knee     nitroGLYCERIN (NITROSTAT) 0.4 MG SL tablet Place 1 tablet (0.4 mg total) under the tongue every 5 (five) minutes as needed for chest pain. 25 tablet 3   olmesartan (BENICAR) 20 MG tablet Take 1 tablet (20 mg total) by mouth daily. (Patient taking differently: Take 20 mg by mouth at bedtime.) 90 tablet 3   omeprazole (PRILOSEC) 40 MG capsule Take 1 capsule (40 mg total) by mouth daily. 90 capsule 3   sildenafil (VIAGRA) 100 MG tablet Take 0.5-1 tablets (50-100 mg total) by mouth as needed. 30 tablet 11   Vitamin D, Ergocalciferol, (DRISDOL) 1.25 MG (50000 UNIT) CAPS capsule Take 1 capsule (50,000 Units total) by mouth every 7 (seven) days. 12 capsule 1   No current facility-administered medications on file prior to visit.    Allergies  Allergen Reactions   Metformin And Related Other (See Comments)    Stomach upset   Oxycodone     "Makes me  foolish, did not help with pain"   Rosuvastatin Other (See Comments)    Adverse GI symptoms   Codeine     "Did not work" and "made me feel foolish"     Pharmacy:   Primary Insurance Name: [employer]  Best number where you can be reached: [phone]

## 2023-01-04 ENCOUNTER — Other Ambulatory Visit: Payer: Self-pay

## 2023-01-04 MED ORDER — TECHLITE PEN NEEDLES 31G X 8 MM MISC
0 refills | Status: DC
  Filled 2023-01-04: qty 100, 100d supply, fill #0

## 2023-01-04 NOTE — Telephone Encounter (Signed)
Pt contacted. Pt had a seed placement about 2 weeks ago. Has appt with Chelsea on 01/11/23-will discuss EGD then

## 2023-01-05 NOTE — Progress Notes (Unsigned)
Name: Robert [name] DOB: 23-Jan-1951 MRN: [id number]  HPI: CAYSE UNRATH presents for follow up s/p brachytherapy seed placement on 12/21/2022 by Dr. Retta Diones for management of prostate cancer. This procedure was performed in coordination with Dr. Kathrynn Running of Radiation Oncology.  Postop course: Today He reports ***  He {Actions; denies-reports:120008} increased urinary urgency, frequency, nocturia, dysuria, gross hematuria, hesitancy, straining to void, or sensations of incomplete emptying.  He {Actions; denies-reports:120008} rectal bleeding or pain with defecation.  He {Actions; denies-reports:120008} flank pain.  He {Actions; denies-reports:120008} abdominal pain.    Fall Screening: Do you usually have a device to assist in your mobility? {yes/no:20286} ***cane / ***walker / ***wheelchair  Medications: Current Outpatient Medications  Medication Sig Dispense Refill   acetaminophen (TYLENOL) 650 MG CR tablet Take 650 mg by mouth every 8 (eight) hours as needed for pain.     aspirin EC 81 MG tablet Take 81 mg by mouth daily.     atorvastatin (LIPITOR) 40 MG tablet Take 1 tablet (40 mg total) by mouth daily. (Patient taking differently: Take 40 mg by mouth at bedtime.) 90 tablet 3   Continuous Glucose Receiver (FREESTYLE LIBRE 3 READER) DEVI Use as directed. 1 each 0   Continuous Glucose Sensor (FREESTYLE LIBRE 3 SENSOR) MISC Place 1 sensor on the skin every 14 days. Use to check glucose continuously 6 each 3   Dulaglutide (TRULICITY) 1.5 MG/0.5ML SOPN Inject 1.5 mg into the skin once a week. (Patient taking differently: Inject 1.5 mg into the skin once a week. friday) 6 mL 3   insulin glargine (LANTUS SOLOSTAR) 100 UNIT/ML Solostar Pen Inject 40 Units into the skin at bedtime. (Patient taking differently: Inject 30 Units into the skin at bedtime.) 45 mL 3   Insulin Pen Needle (TECHLITE PEN NEEDLES) 31G X 8 MM MISC Use as directed once daily 100 each 0   loratadine (CLARITIN) 10 MG  tablet Take 1 tablet (10 mg total) by mouth daily. 30 tablet 1   metoprolol succinate (TOPROL-XL) 50 MG 24 hr tablet Take 1 tablet (50 mg total) by mouth daily with or immediately following a meal (Patient taking differently: Take 50 mg by mouth at bedtime.) 90 tablet 3   neomycin-bacitracin-polymyxin (NEOSPORIN) OINT Apply 1 Application topically 3 (three) times daily. To rash below right knee     nitroGLYCERIN (NITROSTAT) 0.4 MG SL tablet Place 1 tablet (0.4 mg total) under the tongue every 5 (five) minutes as needed for chest pain. 25 tablet 3   olmesartan (BENICAR) 20 MG tablet Take 1 tablet (20 mg total) by mouth daily. (Patient taking differently: Take 20 mg by mouth at bedtime.) 90 tablet 3   omeprazole (PRILOSEC) 40 MG capsule Take 1 capsule (40 mg total) by mouth daily. 90 capsule 3   sildenafil (VIAGRA) 100 MG tablet Take 0.5-1 tablets (50-100 mg total) by mouth as needed. 30 tablet 11   Vitamin D, Ergocalciferol, (DRISDOL) 1.25 MG (50000 UNIT) CAPS capsule Take 1 capsule (50,000 Units total) by mouth every 7 (seven) days. 12 capsule 1   No current facility-administered medications for this visit.    Allergies: Allergies  Allergen Reactions   Metformin And Related Other (See Comments)    Stomach upset   Oxycodone     "Makes me foolish, did not help with pain"   Rosuvastatin Other (See Comments)    Adverse GI symptoms   Codeine     "Did not work" and "made me feel foolish"    Past Medical  History:  Diagnosis Date   Arteriosclerotic cardiovascular disease (ASCVD)    DES to LAD D1 - 2003 in [place]; failed intervention for a totally obstructed RCA at that time; EF of 45%; 12/2009: Equivocal stress nuclear with good exercise tolerance, negative stress EKG, normal EF with mild mid and distal inferior ischemia   Arthritis    Coronary artery disease    Diabetes mellitus, insulin dependent (IDDM), controlled 10/25/2012   HBA1C is 6.9 on 10/22/2012, [employer]rees[employer] 04/26/2020   Diabetes  mellitus, type II (HCC)    GERD (gastroesophageal reflux disease)    Hyperlipidemia    Lipid profile in 09/2011:128, 130, 33, 69   Hypertension    Lab  09/2011: Normal CMet ex G-133   Pneumonia    yrs ago   Prostate cancer (HCC)    Rash    right legt below knee using neosporin 2 to 3 x per day, area healing   Sleep apnea    no CPAP, can't tolerate   Tobacco abuse, in remission    30 pack years; quit in 1985   Wears glasses    Past Surgical History:  Procedure Laterality Date   BIOPSY  01/05/2022   Procedure: BIOPSY;  Surgeon: Marguerita Merles, Reuel Boom, MD;  Location: [facility];  Service: Gastroenterology;;   bone spur Left    foot big toe   CARDIAC CATHETERIZATION  2003   DES placed,  done in [place]   COLONOSCOPY  04/03/2008   Negative screening study   COLONOSCOPY N/A 11/11/2014   Procedure: COLONOSCOPY;  Surgeon: Malissa Hippo, MD;  Location: [facility];  Service: Endoscopy;  Laterality: N/A;  1030   COLONOSCOPY WITH PROPOFOL N/A 01/05/2022   Procedure: COLONOSCOPY WITH PROPOFOL;  Surgeon: Dolores Frame, MD;  Location: [facility];  Service: Gastroenterology;  Laterality: N/A;  730 ASA 2   EPIDIDYMIS SURGERY  04/03/1994   ESOPHAGOGASTRODUODENOSCOPY (EGD) WITH PROPOFOL N/A 01/05/2022   Procedure: ESOPHAGOGASTRODUODENOSCOPY (EGD) WITH PROPOFOL;  Surgeon: Dolores Frame, MD;  Location: [facility];  Service: Gastroenterology;  Laterality: N/A;   HERNIA REPAIR     umbilical as a baby   KNEE ARTHROSCOPY W/ MENISCECTOMY  11/01/2008   Right   POLYPECTOMY  01/05/2022   Procedure: POLYPECTOMY;  Surgeon: Dolores Frame, MD;  Location: [facility];  Service: Gastroenterology;;   PRESSURE ULCER DEBRIDEMENT  04/03/2004   Right lower extremity   RADIOACTIVE SEED IMPLANT N/A 12/21/2022   Procedure: RADIOACTIVE SEED IMPLANT/BRACHYTHERAPY IMPLANT;  Surgeon: Marcine Matar, MD;  Location: [facility];  Service: Urology;   Laterality: N/A;   ROTATOR CUFF REPAIR  07/02/2009   Right   SPACE OAR INSTILLATION N/A 12/21/2022   Procedure: SPACE OAR INSTILLATION;  Surgeon: Marcine Matar, MD;  Location: [facility];  Service: Urology;  Laterality: N/A;   TOTAL KNEE ARTHROPLASTY Right 04/24/2022   Procedure: TOTAL KNEE ARTHROPLASTY;  Surgeon: Ollen Gross, MD;  Location: [facility];  Service: Orthopedics;  Laterality: Right;   Family History  Problem Relation Age of Onset   Diabetes Sister    Arthritis Brother    Diabetes Brother    Arthritis Mother    Cancer Father        Hypernephroma   Social History   Socioeconomic History   Marital status: Married    Spouse name: Not on file   Number of children: 2   Years of education: Not on file   Highest education level: Associate degree: occupational, Scientist, product/process development,  or vocational program  Occupational History   Occupation: Retired  Tobacco Use   Smoking status: Former    Current packs/day: 0.00    Average packs/day: 1 pack/day for 30.0 years (30.0 ttl pk-yrs)    Types: Cigarettes    Start date: 04/03/1966    Quit date: 09/02/1983    Years since quitting: 39.3   Smokeless tobacco: Never  Vaping Use   Vaping status: Never Used  Substance and Sexual Activity   Alcohol use: No    Alcohol/week: 0.0 standard drinks of alcohol    Comment: Former Therapist, nutritional -discontinued use in 2005   Drug use: No   Sexual activity: Yes  Other Topics Concern   Not on file  Social History Narrative   Not on file   Social Determinants of Health   Financial Resource Strain: Low Risk  (07/17/2022)   Overall Financial Resource Strain (CARDIA)    Difficulty of Paying Living Expenses: Not hard at all  Food Insecurity: No Food Insecurity (11/25/2022)   Hunger Vital Sign    Worried About Running Out of Food in the Last Year: Never true    Ran Out of Food in the Last Year: Never true  Transportation Needs: No Transportation Needs (11/25/2022)   PRAPARE - Therapist, art (Medical): No    Lack of Transportation (Non-Medical): No  Physical Activity: Insufficiently Active (07/17/2022)   Exercise Vital Sign    Days of Exercise per Week: 2 days    Minutes of Exercise per Session: 30 min  Stress: No Stress Concern Present (07/17/2022)   [employer] of Occupational Health - Occupational Stress Questionnaire    Feeling of Stress : Only a little  Social Connections: Socially Integrated (07/17/2022)   Social Connection and Isolation Panel [NHANES]    Frequency of Communication with Friends and Family: Three times a week    Frequency of Social Gatherings with Friends and Family: Three times a week    Attends Religious Services: More than 4 times per year    Active Member of Clubs or Organizations: Yes    Attends Banker Meetings: 1 to 4 times per year    Marital Status: Married  Catering manager Violence: Not At Risk (11/25/2022)   Humiliation, Afraid, Rape, and Kick questionnaire    Fear of Current or Ex-Partner: No    Emotionally Abused: No    Physically Abused: No    Sexually Abused: No    SUBJECTIVE  Review of Systems*** Constitutional: Patient denies any unintentional weight loss or change in strength lntegumentary: Patient denies any rashes or pruritus Eyes: Patient denies ***dry eyes ENT: Patient ***denies dry mouth Cardiovascular: Patient denies chest pain or syncope Respiratory: Patient denies shortness of breath Gastrointestinal: Patient ***denies nausea, vomiting, constipation, or diarrhea Musculoskeletal: Patient denies muscle cramps or weakness Neurologic: Patient denies convulsions or seizures Allergic/Immunologic: Patient denies recent allergic reaction(s) Hematologic/Lymphatic: Patient denies bleeding tendencies Endocrine: Patient denies heat/cold intolerance  GU: As per HPI.  OBJECTIVE There were no vitals filed for this visit. There is no height or weight on file to calculate BMI.  Physical  Examination*** Constitutional: No obvious distress; patient is non-toxic appearing  Cardiovascular: No visible lower extremity edema.  Respiratory: The patient does not have audible wheezing/stridor; respirations do not appear labored  Gastrointestinal: Abdomen non-distended Musculoskeletal: Normal ROM of UEs  Skin: No obvious rashes/open sores  Neurologic: CN 2-12 grossly intact Psychiatric: Answered questions appropriately with normal affect  Hematologic/Lymphatic/Immunologic: No obvious bruises  or sites of spontaneous bleeding  GU: Catheter draining ***clear yellow urine.  ASSESSMENT No diagnosis found.  ***We reviewed the operative procedure and outcome. He is doing ***well.   ***Voiding trial was done and pt was able to successfully empty bladder of the contents instilled. Foley catheter to remain out. Discussion was had about increasing water intake for the next few day to insure good voiding.  *** Advised pt to CIC as needed and pt is aware to call if needing to CIC regularly so we can order supplies for them.  *** Voiding trial was performed and pt was unable to successfully empty bladder of the contents instilled. Foley catheter to remain in place. Will attempt voiding trial again in 2 weeks. If unable to pass voiding trial at that time, we will pursue further evaluation with urodynamic testing. Pt is aware. All questions answered.  Discussed plan to follow up with Dr. Ronne Binning at 3 months postop with PSA check prior. Patient was instructed to follow up with his oncology team as advised. Pt verbalized understanding and agreement. All questions were answered.  PLAN Advised the following: 1. Foley catheter ***discontinued. 2. Follow up with oncology team as scheduled. 3. ***No follow-ups on file.  *** Billing: 90 day global period for brachytherapy***  No orders of the defined types were placed in this encounter.   It has been explained that the patient is to follow  regularly with their PCP in addition to all other providers involved in their care and to follow instructions provided by these respective offices. Patient advised to contact urology clinic if any urologic-pertaining questions, concerns, new symptoms or problems arise in the interim period.  There are no Patient Instructions on file for this visit.  Electronically signed by:  Donnita Falls, MSN, FNP-C, CUNP 01/05/2023 2:47 PM

## 2023-01-08 ENCOUNTER — Other Ambulatory Visit: Payer: Self-pay

## 2023-01-09 ENCOUNTER — Telehealth: Payer: Self-pay | Admitting: *Deleted

## 2023-01-09 NOTE — Telephone Encounter (Signed)
CALLED PATIENT TO REMND OF POST SEED APPTS. FOR 01-10-23, SPOKE WITH PATIENT AND HE IS AWARE OF THESE APPTS.

## 2023-01-10 ENCOUNTER — Ambulatory Visit
Admission: RE | Admit: 2023-01-10 | Discharge: 2023-01-10 | Disposition: A | Discharge status: Home / Self Care | Payer: PPO | Source: Ambulatory Visit | Attending: Urology | Admitting: Urology

## 2023-01-10 ENCOUNTER — Encounter: Payer: Self-pay | Admitting: Urology

## 2023-01-10 ENCOUNTER — Ambulatory Visit
Admission: RE | Admit: 2023-01-10 | Discharge: 2023-01-10 | Disposition: A | Discharge status: Home / Self Care | Payer: PPO | Source: Ambulatory Visit | Attending: Radiation Oncology | Admitting: Radiation Oncology

## 2023-01-10 VITALS — BP 117/68 | HR 81 | Temp 98.3°F | Resp 20 | Ht 69.0 in | Wt 200.0 lb

## 2023-01-10 VITALS — BP 117/68 | HR 81 | Temp 98.3°F | Resp 20

## 2023-01-10 DIAGNOSIS — Z7985 Long-term (current) use of injectable non-insulin antidiabetic drugs: Secondary | ICD-10-CM | POA: Insufficient documentation

## 2023-01-10 DIAGNOSIS — Z7982 Long term (current) use of aspirin: Secondary | ICD-10-CM | POA: Insufficient documentation

## 2023-01-10 DIAGNOSIS — Z79899 Other long term (current) drug therapy: Secondary | ICD-10-CM | POA: Insufficient documentation

## 2023-01-10 DIAGNOSIS — Z923 Personal history of irradiation: Secondary | ICD-10-CM | POA: Insufficient documentation

## 2023-01-10 DIAGNOSIS — C61 Malignant neoplasm of prostate: Secondary | ICD-10-CM | POA: Diagnosis not present

## 2023-01-10 NOTE — Progress Notes (Addendum)
Post-seed nursing interview for a diagnosis of Stage T1c adenocarcinoma of the prostate with Gleason score of 3+4, and PSA of 4.3.  Patient identity verified x2.   Patient reports fatigue, nocturia x5+, groin discomfort 6/10 (Tylenol helping). Patient denies any other related issues at this time.  Meaningful use complete.  I-PSS score- 17 - Moderate SHIM score- 2- No sexual activity within 6 months. Urinary Management medication(s) None Urology appointment date- 01/11/2023 with Dr. Marland KitchenPending" at [facility]  Vitals- BP 117/68 (BP Location: Left Arm, Patient Position: Sitting, Cuff Size: Normal)   Pulse 81   Temp 98.3 F (36.8 C) (Temporal)   Resp 20   Ht 5\' 9"  (1.753 m)   Wt 200 lb (90.7 kg)   SpO2 100%   BMI 29.53 kg/m    This concludes the interaction.  Ruel Favors, LPN

## 2023-01-10 NOTE — Progress Notes (Signed)
Radiation Oncology         [phone]) [phone] ________________________________  Name: ESSEY BRESSLER MRN: [id number]  Date: 01/10/2023  DOB: 1950-10-12  COMPLEX SIMULATION NOTE  NARRATIVE:  The patient was brought to the CT Simulation planning suite today following prostate seed implantation approximately one month ago.  Identity was confirmed.  All relevant records and images related to the planned course of therapy were reviewed.  Then, the patient was set-up supine.  CT images were obtained.  The CT images were loaded into the planning software.  Then the prostate and rectum were contoured.  Treatment planning then occurred.  The implanted iodine 125 seeds were identified by the physics staff for projection of radiation distribution  I have requested : 3D Simulation  I have requested a DVH of the following structures: Prostate and rectum.    ________________________________  Artist Pais Kathrynn Running, M.D.

## 2023-01-10 NOTE — Progress Notes (Signed)
Radiation Oncology         [phone] ________________________________  Name: Robert [name] MRN: [id number]  Date: 01/10/2023  DOB: Dec 02, 1950  Post-Seed Follow-Up Visit Note  CC: Kerri Perches, MD  Marcine Matar, MD  Diagnosis:   72 y.o. gentleman with Stage T1c adenocarcinoma of the prostate with Gleason score of 3+4, and PSA of 4.3.     ICD-10-CM   1. Prostate cancer (HCC)  C61       Interval Since Last Radiation:  3 weeks 12/21/22:  Insertion of radioactive I-125 seeds into the prostate gland; 145 Gy, definitive therapy with placement of SpaceOAR gel.  Narrative:  The patient returns today for routine follow-up.  He is complaining of increased urinary frequency and urinary hesitation symptoms. He filled out a questionnaire regarding urinary function today providing and overall IPSS score of 17 characterizing his symptoms as moderate-severe with nocturia x5, weaker flow of stream, intermittency, frequency and urgency.  He still has some mild tingling at the start of his stream but denies dysuria, gross hematuria, fever or chills. His pre-implant score was 8. He denies any abdominal pain or bowel symptoms. His stamina is slightly decreased but overall, he is pleased with his progress to date.  ALLERGIES:  is allergic to metformin and related, oxycodone, rosuvastatin, and codeine.  Meds: Current Outpatient Medications  Medication Sig Dispense Refill   acetaminophen (TYLENOL) 650 MG CR tablet Take 650 mg by mouth every 8 (eight) hours as needed for pain.     aspirin EC 81 MG tablet Take 81 mg by mouth daily.     atorvastatin (LIPITOR) 40 MG tablet Take 1 tablet (40 mg total) by mouth daily. (Patient taking differently: Take 40 mg by mouth at bedtime.) 90 tablet 3   Continuous Glucose Receiver (FREESTYLE LIBRE 3 READER) DEVI Use as directed. 1 each 0   Continuous Glucose Sensor (FREESTYLE LIBRE 3 SENSOR) MISC Place 1 sensor on the skin every 14 days. Use to check glucose  continuously 6 each 3   Dulaglutide (TRULICITY) 1.5 MG/0.5ML SOPN Inject 1.5 mg into the skin once a week. (Patient taking differently: Inject 1.5 mg into the skin once a week. friday) 6 mL 3   insulin glargine (LANTUS SOLOSTAR) 100 UNIT/ML Solostar Pen Inject 40 Units into the skin at bedtime. (Patient taking differently: Inject 30 Units into the skin at bedtime.) 45 mL 3   Insulin Pen Needle (TECHLITE PEN NEEDLES) 31G X 8 MM MISC Use as directed once daily 100 each 0   loratadine (CLARITIN) 10 MG tablet Take 1 tablet (10 mg total) by mouth daily. 30 tablet 1   metoprolol succinate (TOPROL-XL) 50 MG 24 hr tablet Take 1 tablet (50 mg total) by mouth daily with or immediately following a meal (Patient taking differently: Take 50 mg by mouth at bedtime.) 90 tablet 3   neomycin-bacitracin-polymyxin (NEOSPORIN) OINT Apply 1 Application topically 3 (three) times daily. To rash below right knee     nitroGLYCERIN (NITROSTAT) 0.4 MG SL tablet Place 1 tablet (0.4 mg total) under the tongue every 5 (five) minutes as needed for chest pain. 25 tablet 3   olmesartan (BENICAR) 20 MG tablet Take 1 tablet (20 mg total) by mouth daily. (Patient taking differently: Take 20 mg by mouth at bedtime.) 90 tablet 3   omeprazole (PRILOSEC) 40 MG capsule Take 1 capsule (40 mg total) by mouth daily. 90 capsule 3   sildenafil (VIAGRA) 100 MG tablet Take 0.5-1 tablets (50-100 mg total) by  mouth as needed. 30 tablet 11   Vitamin D, Ergocalciferol, (DRISDOL) 1.25 MG (50000 UNIT) CAPS capsule Take 1 capsule (50,000 Units total) by mouth every 7 (seven) days. 12 capsule 1   No current facility-administered medications for this encounter.    Physical Findings: In general this is a well appearing African American male in no acute distress. He's alert and oriented x4 and appropriate throughout the examination. Cardiopulmonary assessment is negative for acute distress and he exhibits normal effort.   Lab Findings: Lab Results   Component Value Date   WBC 9.9 04/25/2022   HGB 14.6 12/21/2022   HCT 43.0 12/21/2022   MCV 91.7 04/25/2022   PLT 227 04/25/2022    Radiographic Findings:  Patient underwent CT imaging in our clinic for post implant dosimetry. The CT will be reviewed by Dr. Kathrynn Running to confirm there is an adequate distribution of radioactive seeds throughout the prostate gland and ensure that there are no seeds in or near the rectum.  We suspect the final radiation plan and dosimetry will show appropriate coverage of the prostate gland. He understands that we will call and inform him of any unexpected findings on further review of his imaging and dosimetry.  Impression/Plan: 72 y.o. gentleman with Stage T1c adenocarcinoma of the prostate with Gleason score of 3+4, and PSA of 4.3.  The patient is recovering from the effects of radiation. His urinary symptoms should gradually improve over the next 4-6 months. We talked about this today. He is encouraged by his improvement already and is otherwise pleased with his outcome. We also talked about long-term follow-up for prostate cancer following seed implant. He understands that ongoing PSA determinations and digital rectal exams will help perform surveillance to rule out disease recurrence. He has a follow up appointment scheduled with Evette Georges, NP at the [facility] [place] office on 01/11/23 and then will see Dr. Retta Diones in 04/2023 following his initial post-treatment PSA. He understands what to expect with his PSA measures. Patient was also educated today about some of the long-term effects from radiation including a small risk for rectal bleeding and possibly erectile dysfunction. We talked about some of the general management approaches to these potential complications. However, I did encourage the patient to contact our office or return at any point if he has questions or concerns related to his previous radiation and prostate cancer.    Marguarite Arbour, PA-C

## 2023-01-11 ENCOUNTER — Ambulatory Visit (INDEPENDENT_AMBULATORY_CARE_PROVIDER_SITE_OTHER): Payer: PPO | Admitting: Gastroenterology

## 2023-01-11 ENCOUNTER — Ambulatory Visit: Payer: PPO | Admitting: Radiation Oncology

## 2023-01-11 ENCOUNTER — Encounter: Payer: Self-pay | Admitting: Urology

## 2023-01-11 ENCOUNTER — Ambulatory Visit: Payer: Self-pay | Admitting: Urology

## 2023-01-11 ENCOUNTER — Encounter (INDEPENDENT_AMBULATORY_CARE_PROVIDER_SITE_OTHER): Payer: Self-pay

## 2023-01-11 ENCOUNTER — Encounter (INDEPENDENT_AMBULATORY_CARE_PROVIDER_SITE_OTHER): Payer: Self-pay | Admitting: Gastroenterology

## 2023-01-11 ENCOUNTER — Ambulatory Visit (INDEPENDENT_AMBULATORY_CARE_PROVIDER_SITE_OTHER): Payer: PPO | Admitting: Urology

## 2023-01-11 VITALS — BP 156/85 | HR 89 | Temp 97.5°F | Ht 69.0 in | Wt 206.7 lb

## 2023-01-11 VITALS — BP 138/78 | HR 69 | Temp 97.6°F

## 2023-01-11 DIAGNOSIS — K3189 Other diseases of stomach and duodenum: Secondary | ICD-10-CM

## 2023-01-11 DIAGNOSIS — Z923 Personal history of irradiation: Secondary | ICD-10-CM | POA: Diagnosis not present

## 2023-01-11 DIAGNOSIS — R1310 Dysphagia, unspecified: Secondary | ICD-10-CM

## 2023-01-11 DIAGNOSIS — K219 Gastro-esophageal reflux disease without esophagitis: Secondary | ICD-10-CM

## 2023-01-11 DIAGNOSIS — K317 Polyp of stomach and duodenum: Secondary | ICD-10-CM | POA: Insufficient documentation

## 2023-01-11 DIAGNOSIS — C61 Malignant neoplasm of prostate: Secondary | ICD-10-CM | POA: Diagnosis not present

## 2023-01-11 DIAGNOSIS — Z09 Encounter for follow-up examination after completed treatment for conditions other than malignant neoplasm: Secondary | ICD-10-CM | POA: Diagnosis not present

## 2023-01-11 LAB — URINALYSIS, ROUTINE W REFLEX MICROSCOPIC
Bilirubin, UA: NEGATIVE
Glucose, UA: NEGATIVE
Ketones, UA: NEGATIVE
Leukocytes,UA: NEGATIVE
Nitrite, UA: NEGATIVE
Protein,UA: NEGATIVE
RBC, UA: NEGATIVE
Specific Gravity, UA: 1.015 (ref 1.005–1.030)
Urobilinogen, Ur: 0.2 mg/dL (ref 0.2–1.0)
pH, UA: 7 (ref 5.0–7.5)

## 2023-01-11 LAB — BLADDER SCAN AMB NON-IMAGING: Scan Result: 0

## 2023-01-11 NOTE — Patient Instructions (Signed)
Schedule EGD

## 2023-01-11 NOTE — Progress Notes (Signed)
Robert [name], M.D. Gastroenterology & Hepatology [facility] Gastroenterology [address] [place], [place] [address]  Primary Care Physician: Kerri Perches, MD [address] [place] [address]  I will communicate my assessment and recommendations to the referring MD via EMR.  Problems: Gastric xanthoma with low-grade dysplasia GERD  History of Present Illness: Robert [name] is a 72 y.o. male with with past medical history of coronary artery disease status post stent placement, diabetes, GERD, hypertension, hyperlipidemia, sleep apnea, prostate cancer s/p bracytherapy, who presents for follow up of gastric xanthoma with low-grade dysplasia and GERD.  The patient had an EGD performed on 01/05/2022 for evaluation of dysphagia.  He had an empiric dilation up to 18 mm with a savory with mucosal disruption.  There was a 4 mm nodule in the gastric fundus which was removed with a cold snare. Pathology showed 1 gastric xanthoma with low-grade dysplasia,.  He was recommended to have a repeat EGD in 1 year.  Since the last time seen in clinic, he had 3 times he felt the food took longer to go down but never had food impaction or issues with regurgitation/vomiting food since then.  The patient denies having any nausea, vomiting, fever, chills, hematochezia, melena, hematemesis, abdominal distention, abdominal pain, diarrhea, jaundice, pruritus. Has lost some weight since he did brachytherapy, but also due to intake of Trulicity. Had scant blood in stool after brachytherapy was started.  Last EGD:As above Last Colonoscopy:01/05/2022 - Two 3 to 4 mm polyps in the sigmoid colon and in the descending colon, removed with a cold snare. Resected and retrieved. - Diverticulosis in the sigmoid colon and in the descending colon. - Non- bleeding internal hemorrhoids.  Pathology showed one colonic tubular adenoma, one colonic inflammatory polyp, repeat  colonoscopy in 7 years   Past Medical History: Past Medical History:  Diagnosis Date   Arteriosclerotic cardiovascular disease (ASCVD)    DES to LAD D1 - 2003 in [place]; failed intervention for a totally obstructed RCA at that time; EF of 45%; 12/2009: Equivocal stress nuclear with good exercise tolerance, negative stress EKG, normal EF with mild mid and distal inferior ischemia   Arthritis    Coronary artery disease    Diabetes mellitus, insulin dependent (IDDM), controlled 10/25/2012   HBA1C is 6.9 on 10/22/2012, [employer]rees[employer] 04/26/2020   Diabetes mellitus, type II (HCC)    GERD (gastroesophageal reflux disease)    Hyperlipidemia    Lipid profile in 09/2011:128, 130, 33, 69   Hypertension    Lab  09/2011: Normal CMet ex G-133   Pneumonia    yrs ago   Prostate cancer (HCC)    Rash    right legt below knee using neosporin 2 to 3 x per day, area healing   Sleep apnea    no CPAP, can't tolerate   Tobacco abuse, in remission    30 pack years; quit in 1985   Wears glasses     Past Surgical History: Past Surgical History:  Procedure Laterality Date   BIOPSY  01/05/2022   Procedure: BIOPSY;  Surgeon: Marguerita Merles, Reuel Boom, MD;  [facility]: [facility];  Service: Gastroenterology;;   bone spur Left    foot big toe   CARDIAC CATHETERIZATION  2003   DES placed,  done in [place]   COLONOSCOPY  04/03/2008   Negative screening study   COLONOSCOPY N/A 11/11/2014   Procedure: COLONOSCOPY;  Surgeon: Malissa Hippo, MD;  Location: [facility];  Service: Endoscopy;  Laterality: N/A;  1030   COLONOSCOPY WITH PROPOFOL N/A 01/05/2022   Procedure: COLONOSCOPY WITH PROPOFOL;  Surgeon: Dolores Frame, MD;  [facility]: [facility];  Service: Gastroenterology;  Laterality: N/A;  730 ASA 2   EPIDIDYMIS SURGERY  04/03/1994   ESOPHAGOGASTRODUODENOSCOPY (EGD) WITH PROPOFOL N/A 01/05/2022   Procedure: ESOPHAGOGASTRODUODENOSCOPY (EGD) WITH PROPOFOL;  Surgeon: Dolores Frame, MD;   [facility]: [facility];  Service: Gastroenterology;  Laterality: N/A;   HERNIA REPAIR     umbilical as a baby   KNEE ARTHROSCOPY W/ MENISCECTOMY  11/01/2008   Right   POLYPECTOMY  01/05/2022   Procedure: POLYPECTOMY;  Surgeon: Dolores Frame, MD;  [facility]: [facility];  Service: Gastroenterology;;   PRESSURE ULCER DEBRIDEMENT  04/03/2004   Right lower extremity   RADIOACTIVE SEED IMPLANT N/A 12/21/2022   Procedure: RADIOACTIVE SEED IMPLANT/BRACHYTHERAPY IMPLANT;  Surgeon: Marcine Matar, MD;  Location[facility];  Service: Urology;  Laterality: N/A;   ROTATOR CUFF REPAIR  07/02/2009   Right   SPACE OAR INSTILLATION N/A 12/21/2022   Procedure: SPACE OAR INSTILLATION;  Surgeon: Marcine Matar, MD;  Location: [facility];  Service: Urology;  Laterality: N/A;   TOTAL KNEE ARTHROPLASTY Right 04/24/2022   Procedure: TOTAL KNEE ARTHROPLASTY;  Surgeon: Ollen Gross, MD;  Location: [facility];  Service: Orthopedics;  Laterality: Right;    Family History: Family History  Problem Relation Age of Onset   Diabetes Sister    Arthritis Brother    Diabetes Brother    Arthritis Mother    Cancer Father        Hypernephroma    Social History: Social History   Tobacco Use  Smoking Status Former   Current packs/day: 0.00   Average packs/day: 1 pack/day for 30.0 years (30.0 ttl pk-yrs)   Types: Cigarettes   Start date: 04/03/1966   Quit date: 09/02/1983   Years since quitting: 39.3  Smokeless Tobacco Never   Social History   Substance and Sexual Activity  Alcohol Use No   Alcohol/week: 0.0 standard drinks of alcohol   Comment: Former Therapist, nutritional -discontinued use in 2005   Social History   Substance and Sexual Activity  Drug Use No    Allergies: Allergies  Allergen Reactions   Metformin And Related Other (See Comments)    Stomach upset   Oxycodone     "Makes me foolish, did not help with pain"   Rosuvastatin Other (See Comments)     Adverse GI symptoms   Codeine     "Did not work" and "made me feel foolish"    Medications: Current Outpatient Medications  Medication Sig Dispense Refill   acetaminophen (TYLENOL) 650 MG CR tablet Take 650 mg by mouth every 8 (eight) hours as needed for pain.     aspirin EC 81 MG tablet Take 81 mg by mouth daily.     atorvastatin (LIPITOR) 40 MG tablet Take 1 tablet (40 mg total) by mouth daily. (Patient taking differently: Take 40 mg by mouth at bedtime.) 90 tablet 3   Continuous Glucose Receiver (FREESTYLE LIBRE 3 READER) DEVI Use as directed. 1 each 0   Continuous Glucose Sensor (FREESTYLE LIBRE 3 SENSOR) MISC Place 1 sensor on the skin every 14 days. Use to check glucose continuously 6 each 3   Dulaglutide (TRULICITY) 1.5 MG/0.5ML SOPN Inject 1.5 mg into the skin once a week. (Patient taking differently: Inject 1.5 mg into the skin once a week. friday) 6 mL  3   insulin glargine (LANTUS SOLOSTAR) 100 UNIT/ML Solostar Pen Inject 40 Units into the skin at bedtime. 45 mL 3   Insulin Pen Needle (TECHLITE PEN NEEDLES) 31G X 8 MM MISC Use as directed once daily 100 each 0   loratadine (CLARITIN) 10 MG tablet Take 1 tablet (10 mg total) by mouth daily. 30 tablet 1   metoprolol succinate (TOPROL-XL) 50 MG 24 hr tablet Take 1 tablet (50 mg total) by mouth daily with or immediately following a meal (Patient taking differently: Take 50 mg by mouth at bedtime.) 90 tablet 3   neomycin-bacitracin-polymyxin (NEOSPORIN) OINT Apply 1 Application topically 3 (three) times daily. To rash below right knee     nitroGLYCERIN (NITROSTAT) 0.4 MG SL tablet Place 1 tablet (0.4 mg total) under the tongue every 5 (five) minutes as needed for chest pain. 25 tablet 3   olmesartan (BENICAR) 20 MG tablet Take 1 tablet (20 mg total) by mouth daily. (Patient taking differently: Take 20 mg by mouth at bedtime.) 90 tablet 3   omeprazole (PRILOSEC) 40 MG capsule Take 1 capsule (40 mg total) by mouth daily. 90 capsule 3    sildenafil (VIAGRA) 100 MG tablet Take 0.5-1 tablets (50-100 mg total) by mouth as needed. 30 tablet 11   Vitamin D, Ergocalciferol, (DRISDOL) 1.25 MG (50000 UNIT) CAPS capsule Take 1 capsule (50,000 Units total) by mouth every 7 (seven) days. 12 capsule 1   No current facility-administered medications for this visit.    Review of Systems: GENERAL: negative for malaise, night sweats HEENT: No changes in hearing or vision, no nose bleeds or other nasal problems. NECK: Negative for lumps, goiter, pain and significant neck swelling RESPIRATORY: Negative for cough, wheezing CARDIOVASCULAR: Negative for chest pain, leg swelling, palpitations, orthopnea GI: SEE HPI MUSCULOSKELETAL: Negative for joint pain or swelling, back pain, and muscle pain. SKIN: Negative for lesions, rash PSYCH: Negative for sleep disturbance, mood disorder and recent psychosocial stressors. HEMATOLOGY Negative for prolonged bleeding, bruising easily, and swollen nodes. ENDOCRINE: Negative for cold or heat intolerance, polyuria, polydipsia and goiter. NEURO: negative for tremor, gait imbalance, syncope and seizures. The remainder of the review of systems is noncontributory.   Physical Exam: BP (!) 156/85 (BP Location: Left Arm, Patient Position: Sitting, Cuff Size: Large)   Pulse 89   Temp (!) 97.5 F (36.4 C) (Temporal)   Ht 5\' 9"  (1.753 m)   Wt 206 lb 11.2 oz (93.8 kg)   BMI 30.52 kg/m  GENERAL: The patient is AO x3, in no acute distress. HEENT: Head is normocephalic and atraumatic. EOMI are intact. Mouth is well hydrated and without lesions. NECK: Supple. No masses LUNGS: Clear to auscultation. No presence of rhonchi/wheezing/rales. Adequate chest expansion HEART: RRR, normal s1 and s2. ABDOMEN: Soft, nontender, no guarding, no peritoneal signs, and nondistended. BS +. No masses. EXTREMITIES: Without any cyanosis, clubbing, rash, lesions or edema. NEUROLOGIC: AOx3, no focal motor deficit. SKIN: no jaundice,  no rashes  Imaging/Labs: as above  I personally reviewed and interpreted the available labs, imaging and endoscopic files.  Impression and Plan: Robert [name] is a 72 y.o. male with with past medical history of coronary artery disease status post stent placement, diabetes, GERD, hypertension, hyperlipidemia, sleep apnea, prostate cancer s/p bracytherapy, who presents for follow up of gastric xanthoma with low-grade dysplasia and GERD.  The patient has been doing well while taking his current dose of omeprazole 40 mg every day.  Has presented very rare dysphagia.  He should  continue with this dosage for now.  Notably, he had presence of low-grade dysplasia on recent polypectomy.  Will need to proceed with an EGD for surveillance of this area.  All questions were answered.      Robert Blazing, MD Gastroenterology and Hepatology [facility] Gastroenterology

## 2023-01-15 ENCOUNTER — Encounter: Payer: Self-pay | Admitting: Radiation Oncology

## 2023-01-15 DIAGNOSIS — Z191 Hormone sensitive malignancy status: Secondary | ICD-10-CM | POA: Diagnosis not present

## 2023-01-15 DIAGNOSIS — C61 Malignant neoplasm of prostate: Secondary | ICD-10-CM | POA: Diagnosis not present

## 2023-01-15 NOTE — Radiation Completion Notes (Addendum)
  Radiation Oncology         [phone] ________________________________  Name: ANGUEL DELAPENA MRN: [id number]  Date: 01/15/2023  DOB: [birth year]  Referring Physician: GARNETTE SHACK, M.D. Date of Service: 2023-01-15 Radiation Oncologist: Adina Barge, M.D. [facility]     RADIATION ONCOLOGY END OF TREATMENT NOTE     Diagnosis: 72 y.o. gentleman with Stage T1c adenocarcinoma of the prostate with Gleason score of 3+4, and PSA of 4.3.   Intent: Curative     ==========DELIVERED PLANS==========  Prostate Seed Implant Date: 2022-12-21   Plan Name: Prostate Seed Implant Site: Prostate Technique: Radioactive Seed Implant I-125 Mode: Brachytherapy Dose Per Fraction: 145 Gy Prescribed Dose (Delivered / Prescribed): 145 Gy / 145 Gy Prescribed Fxs (Delivered / Prescribed): 1 / 1     ==========ON TREATMENT VISIT DATES========== 2022-12-21   The patient will receive a call in about one month from the radiation oncology department. He will continue follow up with Dr. SHACK as well.  ------------------------------------------------   Donnice Barge, MD [facility]  Radiation Oncology Direct Dial: [phone]  Fax: [phone] [email or web address]  Skype  LinkedIn

## 2023-01-15 NOTE — Progress Notes (Signed)
Radiation Oncology         [phone] ________________________________  Name: CHAYDEN SOUTHWOOD MRN: [id number]  Date: 01/15/2023  DOB: September 23, 1950  3D Planning Note   Prostate Brachytherapy Post-Implant Dosimetry  Diagnosis: 72 y.o. gentleman with Stage T1c adenocarcinoma of the prostate with Gleason score of 3+4, and PSA of 4.3.   Narrative: On a previous date, DAISHON HEDMAN returned following prostate seed implantation for post implant planning. He underwent CT scan complex simulation to delineate the three-dimensional structures of the pelvis and demonstrate the radiation distribution.  Since that time, the seed localization, and complex isodose planning with dose volume histograms have now been completed.  Results:   Prostate Coverage - The dose of radiation delivered to the 90% or more of the prostate gland (D90) was 108.36% of the prescription dose. This exceeds our goal of greater than 90%. Rectal Sparing - The volume of rectal tissue receiving the prescription dose or higher was 0.0 cc. This falls under our thresholds tolerance of 1.0 cc.  Impression: The prostate seed implant appears to show adequate target coverage and appropriate rectal sparing.  Plan:  The patient will continue to follow with urology for ongoing PSA determinations. I would anticipate a high likelihood for local tumor control with minimal risk for rectal morbidity.  ________________________________  Artist Pais Kathrynn Running, M.D.

## 2023-01-19 ENCOUNTER — Encounter: Payer: Self-pay | Admitting: *Deleted

## 2023-01-22 ENCOUNTER — Ambulatory Visit: Payer: PPO | Attending: Cardiology | Admitting: Cardiology

## 2023-01-22 ENCOUNTER — Encounter: Payer: Self-pay | Admitting: Cardiology

## 2023-01-22 VITALS — BP 124/64 | HR 82 | Ht 69.0 in | Wt 211.4 lb

## 2023-01-22 DIAGNOSIS — I10 Essential (primary) hypertension: Secondary | ICD-10-CM

## 2023-01-22 DIAGNOSIS — E782 Mixed hyperlipidemia: Secondary | ICD-10-CM

## 2023-01-22 DIAGNOSIS — I2510 Atherosclerotic heart disease of native coronary artery without angina pectoris: Secondary | ICD-10-CM

## 2023-01-22 NOTE — Patient Instructions (Signed)
Medication Instructions:  Your physician recommends that you continue on your current medications as directed. Please refer to the Current Medication list given to you today.  *If you need a refill on your cardiac medications before your next appointment, please call your pharmacy*   Lab Work: None If you have labs (blood work) drawn today and your tests are completely normal, you will receive your results only by: MyChart Message (if you have MyChart) OR A paper copy in the mail If you have any lab test that is abnormal or we need to change your treatment, we will call you to review the results.   Testing/Procedures: None   Follow-Up: At [facility], you and your health needs are our priority.  As part of our continuing mission to provide you with exceptional heart care, we have created designated Provider Care Teams.  These Care Teams include your primary Cardiologist (physician) and Advanced Practice Providers (APPs -  Physician Assistants and Nurse Practitioners) who all work together to provide you with the care you need, when you need it.  We recommend signing up for the patient portal called "MyChart".  Sign up information is provided on this After Visit Summary.  MyChart is used to connect with patients for Virtual Visits (Telemedicine).  Patients are able to view lab/test results, encounter notes, upcoming appointments, etc.  Non-urgent messages can be sent to your provider as well.   To learn more about what you can do with MyChart, go to [email or web address].    Your next appointment:   1 year(s)  Provider:   Jonathan Branch, MD    Other Instructions    

## 2023-01-22 NOTE — Progress Notes (Signed)
Clinical Summary Robert [name] is a 72 y.o.male seen today for follow up of the following medical problems.   1. CAD - history of prior stenting. DES to LAD D1 in [place] in 2003. Failed intervention to RCA CTO at that time.  -08/2014 exercise nuclear stress with Duke treadmill score of 7 consistent with low risk, small area of inferolateral ischemia. Mild inferior ischemia has been noted previously in 12/2009 stress, from notes he has history of RCA CTO that was not able to be opened during 2003 cath in [place].       -no chest pains, no SOB/DOE - compliant with        2. HTN - he is compliant with meds   3. Hyperlipidemia   - 08/2020 TC 120 TG 99 HDL 46 LDL 55 - 11/2021 TC 784 TG 86 HDL 46 LDL 65 - 11/2022 TC 696 TG 295 HDL 46 LDL 65   4. DM II - followed by Dr Lodema Hong  5. OSA -  no longer using CPAP - did not qualify for inspire.    6. AAA screen - no aneurysm by [place] 08/2010   7. Right knee replacement Jan 2024  8. Prostate cancer - recent seed implant.    Past Medical History:  Diagnosis Date   Arteriosclerotic cardiovascular disease (ASCVD)    DES to LAD D1 - 2003 in [place]; failed intervention for a totally obstructed RCA at that time; EF of 45%; 12/2009: Equivocal stress nuclear with good exercise tolerance, negative stress EKG, normal EF with mild mid and distal inferior ischemia   Arthritis    Coronary artery disease    Diabetes mellitus, insulin dependent (IDDM), controlled 10/25/2012   HBA1C is 6.9 on 10/22/2012, Phreesia 04/26/2020   Diabetes mellitus, type II (HCC)    GERD (gastroesophageal reflux disease)    Hyperlipidemia    Lipid profile in 09/2011:128, 130, 33, 69   Hypertension    Lab  09/2011: Normal CMet ex G-133   Pneumonia    yrs ago   Prostate cancer (HCC)    Rash    right legt below knee using neosporin 2 to 3 x per day, area healing   Sleep apnea    no CPAP, can't tolerate   Tobacco abuse, in remission    30 pack years; quit in 1985   Wears  glasses      Allergies  Allergen Reactions   Metformin And Related Other (See Comments)    Stomach upset   Oxycodone     "Makes me foolish, did not help with pain"   Rosuvastatin Other (See Comments)    Adverse GI symptoms   Codeine     "Did not work" and "made me feel foolish"     Current Outpatient Medications  Medication Sig Dispense Refill   acetaminophen (TYLENOL) 650 MG CR tablet Take 650 mg by mouth every 8 (eight) hours as needed for pain.     aspirin EC 81 MG tablet Take 81 mg by mouth daily.     atorvastatin (LIPITOR) 40 MG tablet Take 1 tablet (40 mg total) by mouth daily. (Patient taking differently: Take 40 mg by mouth at bedtime.) 90 tablet 3   Continuous Glucose Receiver (FREESTYLE LIBRE 3 READER) DEVI Use as directed. 1 each 0   Continuous Glucose Sensor (FREESTYLE LIBRE 3 SENSOR) MISC Place 1 sensor on the skin every 14 days. Use to check glucose continuously 6 each 3   Dulaglutide (TRULICITY) 1.5 MG/0.5ML SOPN Inject  1.5 mg into the skin once a week. (Patient taking differently: Inject 1.5 mg into the skin once a week. friday) 6 mL 3   insulin glargine (LANTUS SOLOSTAR) 100 UNIT/ML Solostar Pen Inject 40 Units into the skin at bedtime. 45 mL 3   Insulin Pen Needle (TECHLITE PEN NEEDLES) 31G X 8 MM MISC Use as directed once daily 100 each 0   loratadine (CLARITIN) 10 MG tablet Take 1 tablet (10 mg total) by mouth daily. 30 tablet 1   metoprolol succinate (TOPROL-XL) 50 MG 24 hr tablet Take 1 tablet (50 mg total) by mouth daily with or immediately following a meal (Patient taking differently: Take 50 mg by mouth at bedtime.) 90 tablet 3   neomycin-bacitracin-polymyxin (NEOSPORIN) OINT Apply 1 Application topically 3 (three) times daily. To rash below right knee     nitroGLYCERIN (NITROSTAT) 0.4 MG SL tablet Place 1 tablet (0.4 mg total) under the tongue every 5 (five) minutes as needed for chest pain. 25 tablet 3   olmesartan (BENICAR) 20 MG tablet Take 1 tablet (20 mg  total) by mouth daily. (Patient taking differently: Take 20 mg by mouth at bedtime.) 90 tablet 3   omeprazole (PRILOSEC) 40 MG capsule Take 1 capsule (40 mg total) by mouth daily. 90 capsule 3   sildenafil (VIAGRA) 100 MG tablet Take 0.5-1 tablets (50-100 mg total) by mouth as needed. 30 tablet 11   Vitamin D, Ergocalciferol, (DRISDOL) 1.25 MG (50000 UNIT) CAPS capsule Take 1 capsule (50,000 Units total) by mouth every 7 (seven) days. 12 capsule 1   No current facility-administered medications for this visit.     Past Surgical History:  Procedure Laterality Date   BIOPSY  01/05/2022   Procedure: BIOPSY;  Surgeon: Marguerita Merles, Reuel Boom, MD;  [facility];  Service: Gastroenterology;;   bone spur Left    foot big toe   CARDIAC CATHETERIZATION  2003   DES placed,  done in [place]   COLONOSCOPY  04/03/2008   Negative screening study   COLONOSCOPY N/A 11/11/2014   Procedure: COLONOSCOPY;  Surgeon: Malissa Hippo, MD;  [facility]: [facility];  Service: Endoscopy;  Laterality: N/A;  1030   COLONOSCOPY WITH PROPOFOL N/A 01/05/2022   Procedure: COLONOSCOPY WITH PROPOFOL;  Surgeon: Dolores Frame, MD;  [facility];  Service: Gastroenterology;  Laterality: N/A;  730 ASA 2   EPIDIDYMIS SURGERY  04/03/1994   ESOPHAGOGASTRODUODENOSCOPY (EGD) WITH PROPOFOL N/A 01/05/2022   Procedure: ESOPHAGOGASTRODUODENOSCOPY (EGD) WITH PROPOFOL;  Surgeon: Dolores Frame, MD;  [facility];  Service: Gastroenterology;  Laterality: N/A;   HERNIA REPAIR     umbilical as a baby   KNEE ARTHROSCOPY W/ MENISCECTOMY  11/01/2008   Right   POLYPECTOMY  01/05/2022   Procedure: POLYPECTOMY;  Surgeon: Dolores Frame, MD;  [facility];  Service: Gastroenterology;;   PRESSURE ULCER DEBRIDEMENT  04/03/2004   Right lower extremity   RADIOACTIVE SEED IMPLANT N/A 12/21/2022   Procedure: RADIOACTIVE SEED IMPLANT/BRACHYTHERAPY IMPLANT;  Surgeon:  Marcine Matar, MD;  Location[facility];  Service: Urology;  Laterality: N/A;   ROTATOR CUFF REPAIR  07/02/2009   Right   SPACE OAR INSTILLATION N/A 12/21/2022   Procedure: SPACE OAR INSTILLATION;  Surgeon: Marcine Matar, MD;  Location: [facility];  Service: Urology;  Laterality: N/A;   TOTAL KNEE ARTHROPLASTY Right 04/24/2022   Procedure: TOTAL KNEE ARTHROPLASTY;  Surgeon: Ollen Gross, MD;  Location: [facility];  Service:  Orthopedics;  Laterality: Right;     Allergies  Allergen Reactions   Metformin And Related Other (See Comments)    Stomach upset   Oxycodone     "Makes me foolish, did not help with pain"   Rosuvastatin Other (See Comments)    Adverse GI symptoms   Codeine     "Did not work" and "made me feel foolish"      Family History  Problem Relation Age of Onset   Diabetes Sister    Arthritis Brother    Diabetes Brother    Arthritis Mother    Cancer Father        Hypernephroma     Social History Robert [name] reports that he quit smoking about 39 years ago. His smoking use included cigarettes. He started smoking about 56 years ago. He has a 30 pack-year smoking history. He has never used smokeless tobacco. Robert [name] reports no history of alcohol use.   Review of Systems CONSTITUTIONAL: No weight loss, fever, chills, weakness or fatigue.  HEENT: Eyes: No visual loss, blurred vision, double vision or yellow sclerae.No hearing loss, sneezing, congestion, runny nose or sore throat.  SKIN: No rash or itching.  CARDIOVASCULAR: per hpi RESPIRATORY: No shortness of breath, cough or sputum.  GASTROINTESTINAL: No anorexia, nausea, vomiting or diarrhea. No abdominal pain or blood.  GENITOURINARY: No burning on urination, no polyuria NEUROLOGICAL: No headache, dizziness, syncope, paralysis, ataxia, numbness or tingling in the extremities. No change in bowel or bladder control.  MUSCULOSKELETAL: No muscle, back pain, joint pain or  stiffness.  LYMPHATICS: No enlarged nodes. No history of splenectomy.  PSYCHIATRIC: No history of depression or anxiety.  ENDOCRINOLOGIC: No reports of sweating, cold or heat intolerance. No polyuria or polydipsia.  Marland Kitchen   Physical Examination Today's Vitals   01/22/23 0841  BP: 124/64  Pulse: 82  SpO2: 95%  Weight: 211 lb 6.4 oz (95.9 kg)  Height: 5\' 9"  (1.753 m)   Body mass index is 31.22 kg/m.  Gen: resting comfortably, no acute distress HEENT: no scleral icterus, pupils equal round and reactive, no palptable cervical adenopathy,  CV: RRR, no m/rg, no jvd Resp: Clear to auscultation bilaterally GI: abdomen is soft, non-tender, non-distended, normal bowel sounds, no hepatosplenomegaly MSK: extremities are warm, no edema.  Skin: warm, no rash Neuro:  no focal deficits Psych: appropriate affect   Diagnostic Studies  08/2014 Exercise Nuclear stress Low risk Duke treadmill score of 7. Blood pressure demonstrated a hypertensive response to exercise. There was no ST segment deviation noted during stress. There is a small defect of mild severity present in the basal inferolateral, mid inferolateral and apical inferior location. The defect is reversible. Consistent with mild, predominantly mid to basal inferolateral ischemia. Myocardial perfusion is abnormal. This is a low risk study.   Assessment and Plan  1. CAD - denies any symptoms, continue current meds   2. HTN - he is at goal, continue current meds     3. Hyperlipidemia -LDL is at goal, continue current meds   F/u 1 year  Antoine Poche, M.D.

## 2023-01-24 ENCOUNTER — Other Ambulatory Visit: Payer: Self-pay

## 2023-01-24 ENCOUNTER — Ambulatory Visit: Payer: PPO | Admitting: Podiatry

## 2023-01-24 ENCOUNTER — Encounter: Payer: Self-pay | Admitting: Podiatry

## 2023-01-24 DIAGNOSIS — M2142 Flat foot [pes planus] (acquired), left foot: Secondary | ICD-10-CM

## 2023-01-24 DIAGNOSIS — M79674 Pain in right toe(s): Secondary | ICD-10-CM

## 2023-01-24 DIAGNOSIS — Q828 Other specified congenital malformations of skin: Secondary | ICD-10-CM | POA: Diagnosis not present

## 2023-01-24 DIAGNOSIS — M2141 Flat foot [pes planus] (acquired), right foot: Secondary | ICD-10-CM

## 2023-01-24 DIAGNOSIS — M79675 Pain in left toe(s): Secondary | ICD-10-CM | POA: Diagnosis not present

## 2023-01-24 DIAGNOSIS — E1159 Type 2 diabetes mellitus with other circulatory complications: Secondary | ICD-10-CM

## 2023-01-24 DIAGNOSIS — B351 Tinea unguium: Secondary | ICD-10-CM | POA: Diagnosis not present

## 2023-01-24 DIAGNOSIS — E119 Type 2 diabetes mellitus without complications: Secondary | ICD-10-CM

## 2023-01-24 NOTE — Progress Notes (Signed)
ANNUAL DIABETIC FOOT EXAM  Subjective: RODRIC HAMPER presents today annual diabetic foot exam. Patient states he has been diagnosed with prostate cancer. Has had radioactive seed implantation.  States his left great toenail is cracked. Unsure of trauma to nail. Hurts if nail gets caught on sock of bed sheet. Chief Complaint  Patient presents with   [facility]    Rm#15 [facility] BS- not taken  A1C 7.1 8/24 PCP appt 12/24 Patient states broken toe nail left big toe needs to be addressed he is unsure how it happened only hurts when something comes in contact with it.   Patient confirms h/o diabetes.  Patient denies any h/o foot wounds.  Kerri Perches, MD is patient's PCP.  Past Medical History:  Diagnosis Date   Arteriosclerotic cardiovascular disease (ASCVD)    DES to LAD D1 - 2003 in [place]; failed intervention for a totally obstructed RCA at that time; EF of 45%; 12/2009: Equivocal stress nuclear with good exercise tolerance, negative stress EKG, normal EF with mild mid and distal inferior ischemia   Arthritis    Coronary artery disease    Diabetes mellitus, insulin dependent (IDDM), controlled 10/25/2012   HBA1C is 6.9 on 10/22/2012, [employer]rees[employer] 04/26/2020   Diabetes mellitus, type II (HCC)    GERD (gastroesophageal reflux disease)    Hyperlipidemia    Lipid profile in 09/2011:128, 130, 33, 69   Hypertension    Lab  09/2011: Normal CMet ex G-133   Pneumonia    yrs ago   Prostate cancer (HCC)    Rash    right legt below knee using neosporin 2 to 3 x per day, area healing   Sleep apnea    no CPAP, can't tolerate   Tobacco abuse, in remission    30 pack years; quit in 1985   Wears glasses    Patient Active Problem List   Diagnosis Date Noted   Gastric polyp 01/11/2023   Prostate cancer (HCC) 07/24/2022   Stiffness of right knee 04/26/2022   Primary osteoarthritis of right knee 04/24/2022   Osteoarthritis of right knee 12/28/2021   OA (osteoarthritis) of knee 11/15/2021    Light headedness 02/12/2021   Asteatotic eczema 12/16/2020   Rash 05/04/2020   Knee pain, right 11/05/2018   Type 2 diabetes mellitus with vascular disease (HCC) 03/05/2018   Dysphagia 03/05/2018   Sleep apnea in adult 04/16/2017   Hypersomnia with sleep apnea 01/16/2016   Seasonal allergies 01/04/2015   Tubular adenoma of colon 11/13/2014   ASCVD (arteriosclerotic cardiovascular disease) 08/21/2014   Bilateral hand pain 04/06/2014   Back pain 09/16/2013   Essential hypertension    Hyperlipidemia LDL goal <70    Overweight (BMI 25.0-29.9) 12/24/2011   Cardiovascular disease 09/27/2009   TOBACCO ABUSE, HX OF 09/27/2009   Past Surgical History:  Procedure Laterality Date   BIOPSY  01/05/2022   Procedure: BIOPSY;  Surgeon: Marguerita Merles, Reuel Boom, MD;  [facility];  Service: Gastroenterology;;   bone spur Left    foot big toe   CARDIAC CATHETERIZATION  2003   DES placed,  done in [place]   COLONOSCOPY  04/03/2008   Negative screening study   COLONOSCOPY N/A 11/11/2014   Procedure: COLONOSCOPY;  Surgeon: Malissa Hippo, MD;  [facility];  Service: Endoscopy;  Laterality: N/A;  1030   COLONOSCOPY WITH PROPOFOL N/A 01/05/2022   Procedure: COLONOSCOPY WITH PROPOFOL;  Surgeon: Dolores Frame, MD;  [facility];  Service: Gastroenterology;  Laterality:  N/A;  730 ASA 2   EPIDIDYMIS SURGERY  04/03/1994   ESOPHAGOGASTRODUODENOSCOPY (EGD) WITH PROPOFOL N/A 01/05/2022   Procedure: ESOPHAGOGASTRODUODENOSCOPY (EGD) WITH PROPOFOL;  Surgeon: Dolores Frame, MD;  [facility];  Service: Gastroenterology;  Laterality: N/A;   HERNIA REPAIR     umbilical as a baby   KNEE ARTHROSCOPY W/ MENISCECTOMY  11/01/2008   Right   POLYPECTOMY  01/05/2022   Procedure: POLYPECTOMY;  Surgeon: Dolores Frame, MD;  [facility];  Service: Gastroenterology;;   PRESSURE ULCER DEBRIDEMENT  04/03/2004   Right lower  extremity   RADIOACTIVE SEED IMPLANT N/A 12/21/2022   Procedure: RADIOACTIVE SEED IMPLANT/BRACHYTHERAPY IMPLANT;  Surgeon: Marcine Matar, MD;  Location: [facility];  Service: Urology;  Laterality: N/A;   ROTATOR CUFF REPAIR  07/02/2009   Right   SPACE OAR INSTILLATION N/A 12/21/2022   Procedure: SPACE OAR INSTILLATION;  Surgeon: Marcine Matar, MD;  Location: [facility];  Service: Urology;  Laterality: N/A;   TOTAL KNEE ARTHROPLASTY Right 04/24/2022   Procedure: TOTAL KNEE ARTHROPLASTY;  Surgeon: Ollen Gross, MD;  Location: [facility];  Service: Orthopedics;  Laterality: Right;   Current Outpatient Medications on File Prior to Visit  Medication Sig Dispense Refill   acetaminophen (TYLENOL) 650 MG CR tablet Take 650 mg by mouth every 8 (eight) hours as needed for pain.     aspirin EC 81 MG tablet Take 81 mg by mouth daily.     atorvastatin (LIPITOR) 40 MG tablet Take 1 tablet (40 mg total) by mouth daily. (Patient taking differently: Take 40 mg by mouth at bedtime.) 90 tablet 3   Continuous Glucose Receiver (FREESTYLE LIBRE 3 READER) DEVI Use as directed. 1 each 0   Continuous Glucose Sensor (FREESTYLE LIBRE 3 SENSOR) MISC Place 1 sensor on the skin every 14 days. Use to check glucose continuously 6 each 3   Dulaglutide (TRULICITY) 1.5 MG/0.5ML SOPN Inject 1.5 mg into the skin once a week. (Patient taking differently: Inject 1.5 mg into the skin once a week. friday) 6 mL 3   insulin glargine (LANTUS SOLOSTAR) 100 UNIT/ML Solostar Pen Inject 40 Units into the skin at bedtime. 45 mL 3   Insulin Pen Needle (TECHLITE PEN NEEDLES) 31G X 8 MM MISC Use as directed once daily 100 each 0   loratadine (CLARITIN) 10 MG tablet Take 1 tablet (10 mg total) by mouth daily. 30 tablet 1   metoprolol succinate (TOPROL-XL) 50 MG 24 hr tablet Take 1 tablet (50 mg total) by mouth daily with or immediately following a meal (Patient taking differently: Take 50 mg by mouth at  bedtime.) 90 tablet 3   neomycin-bacitracin-polymyxin (NEOSPORIN) OINT Apply 1 Application topically 3 (three) times daily. To rash below right knee     nitroGLYCERIN (NITROSTAT) 0.4 MG SL tablet Place 1 tablet (0.4 mg total) under the tongue every 5 (five) minutes as needed for chest pain. 25 tablet 3   olmesartan (BENICAR) 20 MG tablet Take 1 tablet (20 mg total) by mouth daily. (Patient taking differently: Take 20 mg by mouth at bedtime.) 90 tablet 3   omeprazole (PRILOSEC) 40 MG capsule Take 1 capsule (40 mg total) by mouth daily. 90 capsule 3   sildenafil (VIAGRA) 100 MG tablet Take 0.5-1 tablets (50-100 mg total) by mouth as needed. 30 tablet 11   Vitamin D, Ergocalciferol, (DRISDOL) 1.25 MG (50000 UNIT) CAPS capsule Take 1 capsule (50,000 Units total) by mouth every 7 (seven) days. 12  capsule 1   No current facility-administered medications on file prior to visit.    Allergies  Allergen Reactions   Metformin And Related Other (See Comments)    Stomach upset   Oxycodone     "Makes me foolish, did not help with pain"   Rosuvastatin Other (See Comments)    Adverse GI symptoms   Codeine     "Did not work" and "made me feel foolish"   Social History   Occupational History   Occupation: Retired  Tobacco Use   Smoking status: Former    Current packs/day: 0.00    Average packs/day: 1 pack/day for 30.0 years (30.0 ttl pk-yrs)    Types: Cigarettes    Start date: 04/03/1966    Quit date: 09/02/1983    Years since quitting: 39.4   Smokeless tobacco: Never  Vaping Use   Vaping status: Never Used  Substance and Sexual Activity   Alcohol use: No    Alcohol/week: 0.0 standard drinks of alcohol    Comment: Former Therapist, nutritional -discontinued use in 2005   Drug use: No   Sexual activity: Yes   Family History  Problem Relation Age of Onset   Diabetes Sister    Arthritis Brother    Diabetes Brother    Arthritis Mother    Cancer Father        Hypernephroma   Immunization History   Administered Date(s) Administered   Fluad Quad(high Dose 65+) 01/01/2019, 12/17/2020, 12/27/2021   Influenza Split 02/14/2011, 12/18/2011   Influenza Whole 12/25/2006, 12/23/2008, 01/13/2010   Influenza,inj,Quad PF,6+ Mos 01/10/2013, 12/02/2013, 01/26/2015, 12/09/2015, 11/30/2016, 12/31/2017, 12/13/2019   Moderna Covid-19 Vaccine Bivalent Booster 46yrs & up 01/28/2021, 07/28/2022   Moderna SARS-COV2 Booster Vaccination 01/12/2022   PFIZER(Purple Top)SARS-COV-2 Vaccination 05/01/2019, 05/23/2019, 01/08/2020, 07/12/2020   Pneumococcal Conjugate-13 11/10/2013   Pneumococcal Polysaccharide-23 03/10/2004, 01/13/2010, 11/30/2016   Rsv, Bivalent, Protein Subunit Rsvpref,pf Verdis Frederickson) 12/27/2021   Tdap 12/18/2011, 07/28/2022   Zoster Recombinant(Shingrix) 12/17/2019, 08/03/2020   Zoster, Live 12/18/2011     Review of Systems: Negative except as noted in the HPI.   Objective: There were no vitals filed for this visit.  ZUKO BISSET is a pleasant 72 y.o. male in NAD. AAO X 3.  Title   Diabetic Foot Exam - detailed Date & Time: 01/24/2023  8:30 AM Diabetic Foot exam was performed with the following findings: Yes  Visual Foot Exam completed.: Yes  Is there a history of foot ulcer?: No Is there a foot ulcer now?: No Is there swelling?: No Is there elevated skin temperature?: No Is there abnormal foot shape?: Yes (Comment: Pes planus b/l) Is there a claw toe deformity?: No Are the toenails long?: Yes Are the toenails thick?: Yes Are the toenails ingrown?: No Is the skin thin, fragile, shiny and hairless?": No Normal Range of Motion?: Yes Is there foot or ankle muscle weakness?: No Do you have pain in calf while walking?: No Are the shoes appropriate in style and fit?: Yes Can the patient see the bottom of their feet?: Yes Pulse Foot Exam completed.: Yes   Right Posterior Tibialis: Present Left posterior Tibialis: Present   Right Dorsalis Pedis: Present Left Dorsalis Pedis: Present      Sensory Foot Exam Completed.: Yes Semmes-Weinstein Monofilament Test "+" means "has sensation" and "-" means "no sensation"  R Foot Test Control: Pos L Foot Test Control: Pos   R Site 1-Great Toe: Pos L Site 1-Great Toe: Pos   R Site 4: Pos L Site 4:  Pos   R site 5: Pos L Site 5: Pos  R Site 6: Pos L Site 6: Pos     Image components are not supported.   Image components are not supported. Image components are not supported.  Tuning Fork Right vibratory: present Left vibratory: present  Comments Multiple porokeratotic lesions plantar aspect of both feet =4.      Lab Results  Component Value Date   HGBA1C 7.1 (H) 11/16/2022   ADA Risk Categorization: Low Risk :  Patient has all of the following: Intact protective sensation No prior foot ulcer  No severe deformity Pedal pulses present  Assessment: 1. Pain due to onychomycosis of toenails of both feet   2. Porokeratosis   3. Pes planus of both feet   4. Type 2 diabetes mellitus with vascular disease (HCC)   5. Encounter for diabetic foot exam (HCC)     Plan: -Consent given for treatment as described below: -Examined patient. -Diabetic foot examination performed today. -Patient to continue soft, supportive shoe gear daily. -Toenails 1-5 b/l were debrided in length and girth with sterile nail nippers and dremel without iatrogenic bleeding.  -Porokeratotic lesion(s) >4 and b/l feet pared and enucleated with sterile currette without incident. Total number of lesions debrided=4. -Patient/POA to call should there be question/concern in the interim. Return in about 3 months (around 04/26/2023).  [name], DPM

## 2023-01-25 ENCOUNTER — Other Ambulatory Visit: Payer: Self-pay

## 2023-01-29 ENCOUNTER — Other Ambulatory Visit (HOSPITAL_COMMUNITY): Payer: Self-pay

## 2023-01-29 ENCOUNTER — Encounter (HOSPITAL_COMMUNITY): Payer: Self-pay

## 2023-01-29 ENCOUNTER — Encounter: Payer: Self-pay | Admitting: Podiatry

## 2023-01-29 ENCOUNTER — Other Ambulatory Visit: Payer: Self-pay

## 2023-02-05 ENCOUNTER — Other Ambulatory Visit (HOSPITAL_COMMUNITY): Payer: Self-pay

## 2023-02-10 ENCOUNTER — Other Ambulatory Visit (HOSPITAL_COMMUNITY): Payer: Self-pay

## 2023-02-12 NOTE — Anesthesia Preprocedure Evaluation (Signed)
Anesthesia Evaluation  Patient identified by MRN, date of birth, ID band Patient awake    Reviewed: Allergy & Precautions, NPO status , Patient's Chart, lab work & pertinent test results, reviewed documented beta blocker date and time   Airway Mallampati: III  TM Distance: >3 FB Neck ROM: Full    Dental no notable dental hx. (+) Dental Advisory Given, Caps Crowns :   Pulmonary sleep apnea , former smoker   Pulmonary exam normal breath sounds clear to auscultation       Cardiovascular Exercise Tolerance: Good hypertension, Pt. on medications and Pt. on home beta blockers + CAD and + Cardiac Stents  Normal cardiovascular exam Rhythm:Regular Rate:Normal     Neuro/Psych negative neurological ROS  negative psych ROS   GI/Hepatic Neg liver ROS,GERD  ,,  Endo/Other  diabetes, Well Controlled, Type 2, Oral Hypoglycemic Agents, Insulin Dependent    Renal/GU negative Renal ROS  negative genitourinary   Musculoskeletal  (+) Arthritis , Osteoarthritis,    Abdominal Normal abdominal exam  (+)   Peds negative pediatric ROS (+)  Hematology negative hematology ROS (+)   Anesthesia Other Findings   Reproductive/Obstetrics negative OB ROS                             Anesthesia Physical Anesthesia Plan  ASA: 3  Anesthesia Plan: General   Post-op Pain Management: Minimal or no pain anticipated   Induction: Intravenous  PONV Risk Score and Plan: Propofol infusion  Airway Management Planned: Nasal Cannula and Natural Airway  Additional Equipment: None  Intra-op Plan:   Post-operative Plan:   Informed Consent: I have reviewed the patients History and Physical, chart, labs and discussed the procedure including the risks, benefits and alternatives for the proposed anesthesia with the patient or authorized representative who has indicated his/her understanding and acceptance.     Dental advisory  given  Plan Discussed with: CRNA  Anesthesia Plan Comments:         Anesthesia Quick Evaluation

## 2023-02-13 ENCOUNTER — Other Ambulatory Visit: Payer: Self-pay

## 2023-02-13 ENCOUNTER — Encounter (HOSPITAL_COMMUNITY)
Admission: RE | Disposition: A | Discharge status: Home / Self Care | Payer: Self-pay | Source: Home / Self Care | Attending: Gastroenterology

## 2023-02-13 ENCOUNTER — Ambulatory Visit (HOSPITAL_BASED_OUTPATIENT_CLINIC_OR_DEPARTMENT_OTHER): Payer: PPO | Admitting: Anesthesiology

## 2023-02-13 ENCOUNTER — Ambulatory Visit (HOSPITAL_COMMUNITY)
Admission: RE | Admit: 2023-02-13 | Discharge: 2023-02-13 | Disposition: A | Discharge status: Home / Self Care | Payer: PPO | Attending: Gastroenterology | Admitting: Gastroenterology

## 2023-02-13 ENCOUNTER — Encounter (HOSPITAL_COMMUNITY): Payer: Self-pay | Admitting: Gastroenterology

## 2023-02-13 ENCOUNTER — Ambulatory Visit (HOSPITAL_COMMUNITY): Payer: Self-pay | Admitting: Anesthesiology

## 2023-02-13 DIAGNOSIS — E785 Hyperlipidemia, unspecified: Secondary | ICD-10-CM | POA: Insufficient documentation

## 2023-02-13 DIAGNOSIS — Z833 Family history of diabetes mellitus: Secondary | ICD-10-CM | POA: Diagnosis not present

## 2023-02-13 DIAGNOSIS — Z955 Presence of coronary angioplasty implant and graft: Secondary | ICD-10-CM | POA: Insufficient documentation

## 2023-02-13 DIAGNOSIS — Z87891 Personal history of nicotine dependence: Secondary | ICD-10-CM | POA: Insufficient documentation

## 2023-02-13 DIAGNOSIS — K222 Esophageal obstruction: Secondary | ICD-10-CM | POA: Diagnosis not present

## 2023-02-13 DIAGNOSIS — D131 Benign neoplasm of stomach: Secondary | ICD-10-CM | POA: Diagnosis not present

## 2023-02-13 DIAGNOSIS — Z794 Long term (current) use of insulin: Secondary | ICD-10-CM | POA: Insufficient documentation

## 2023-02-13 DIAGNOSIS — I2510 Atherosclerotic heart disease of native coronary artery without angina pectoris: Secondary | ICD-10-CM

## 2023-02-13 DIAGNOSIS — K219 Gastro-esophageal reflux disease without esophagitis: Secondary | ICD-10-CM | POA: Insufficient documentation

## 2023-02-13 DIAGNOSIS — K3189 Other diseases of stomach and duodenum: Secondary | ICD-10-CM | POA: Insufficient documentation

## 2023-02-13 DIAGNOSIS — R1310 Dysphagia, unspecified: Secondary | ICD-10-CM | POA: Diagnosis not present

## 2023-02-13 DIAGNOSIS — Z79899 Other long term (current) drug therapy: Secondary | ICD-10-CM | POA: Diagnosis not present

## 2023-02-13 DIAGNOSIS — E119 Type 2 diabetes mellitus without complications: Secondary | ICD-10-CM | POA: Insufficient documentation

## 2023-02-13 DIAGNOSIS — Z7985 Long-term (current) use of injectable non-insulin antidiabetic drugs: Secondary | ICD-10-CM | POA: Diagnosis not present

## 2023-02-13 DIAGNOSIS — I10 Essential (primary) hypertension: Secondary | ICD-10-CM | POA: Insufficient documentation

## 2023-02-13 DIAGNOSIS — Z8546 Personal history of malignant neoplasm of prostate: Secondary | ICD-10-CM | POA: Diagnosis not present

## 2023-02-13 DIAGNOSIS — G4730 Sleep apnea, unspecified: Secondary | ICD-10-CM | POA: Diagnosis not present

## 2023-02-13 DIAGNOSIS — K449 Diaphragmatic hernia without obstruction or gangrene: Secondary | ICD-10-CM | POA: Diagnosis not present

## 2023-02-13 DIAGNOSIS — M1990 Unspecified osteoarthritis, unspecified site: Secondary | ICD-10-CM | POA: Diagnosis not present

## 2023-02-13 DIAGNOSIS — Z7984 Long term (current) use of oral hypoglycemic drugs: Secondary | ICD-10-CM | POA: Diagnosis not present

## 2023-02-13 DIAGNOSIS — E755 Other lipid storage disorders: Secondary | ICD-10-CM

## 2023-02-13 DIAGNOSIS — Z8719 Personal history of other diseases of the digestive system: Secondary | ICD-10-CM | POA: Diagnosis not present

## 2023-02-13 HISTORY — PX: BIOPSY: SHX5522

## 2023-02-13 HISTORY — PX: ESOPHAGOGASTRODUODENOSCOPY (EGD) WITH PROPOFOL: SHX5813

## 2023-02-13 LAB — GLUCOSE, CAPILLARY: Glucose-Capillary: 110 mg/dL — ABNORMAL HIGH (ref 70–99)

## 2023-02-13 SURGERY — ESOPHAGOGASTRODUODENOSCOPY (EGD) WITH PROPOFOL
Anesthesia: General

## 2023-02-13 MED ORDER — LIDOCAINE HCL (CARDIAC) PF 100 MG/5ML IV SOSY
PREFILLED_SYRINGE | INTRAVENOUS | Status: DC | PRN
  Administered 2023-02-13: 100 mg via INTRAVENOUS

## 2023-02-13 MED ORDER — FAMOTIDINE 20 MG PO TABS: 20.0000 mg | ORAL_TABLET | Freq: Every evening | ORAL | 3 refills | Status: DC

## 2023-02-13 MED ORDER — LIDOCAINE HCL (PF) 2 % IJ SOLN
INTRAMUSCULAR | Status: AC
Start: 2023-02-13 — End: ?
  Filled 2023-02-13: qty 5

## 2023-02-13 MED ORDER — LIDOCAINE HCL (PF) 2 % IJ SOLN
INTRAMUSCULAR | Status: AC
  Filled 2023-02-13: qty 10

## 2023-02-13 MED ORDER — PROPOFOL 500 MG/50ML IV EMUL
INTRAVENOUS | Status: AC
  Filled 2023-02-13: qty 50

## 2023-02-13 MED ORDER — PHENYLEPHRINE 80 MCG/ML (10ML) SYRINGE FOR IV PUSH (FOR BLOOD PRESSURE SUPPORT)
PREFILLED_SYRINGE | INTRAVENOUS | Status: AC
  Filled 2023-02-13: qty 10

## 2023-02-13 MED ORDER — PROPOFOL 500 MG/50ML IV EMUL
INTRAVENOUS | Status: DC | PRN
  Administered 2023-02-13: 150 ug/kg/min via INTRAVENOUS

## 2023-02-13 MED ORDER — PROPOFOL 10 MG/ML IV BOLUS
INTRAVENOUS | Status: DC | PRN
  Administered 2023-02-13: 30 mg via INTRAVENOUS
  Administered 2023-02-13: 40 mg via INTRAVENOUS
  Administered 2023-02-13: 30 mg via INTRAVENOUS
  Administered 2023-02-13: 100 mg via INTRAVENOUS

## 2023-02-13 MED ORDER — LACTATED RINGERS IV SOLN: INTRAVENOUS | Status: DC | PRN

## 2023-02-13 NOTE — Op Note (Signed)
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 Estimated Blood Loss:     Estimated blood loss: none. Procedure:                Pre-Anesthesia Assessment:                           - Prior to the procedure, a History and Physical                            was performed, and patient medications, allergies                            and sensitivities were reviewed. The patient's                            tolerance of previous anesthesia was reviewed.                           - The risks and benefits of the procedure and the                            sedation options and risks were discussed with the                            patient. All questions were answered and informed                            consent was obtained.                           - ASA Grade Assessment: II - A patient with mild                            systemic disease.                           After obtaining informed consent, the endoscope was                            passed under  direct vision. Throughout the                            procedure, the patient's blood pressure, pulse, and                            oxygen saturations were monitored continuously. The                            GIF-H190 ([id number]) scope was introduced through the                            mouth, and advanced to the second part of duodenum.                            The upper GI  endoscopy was accomplished without                            difficulty. The patient tolerated the procedure                            well. Scope In: 9:01:12 AM Scope Out: 9:09:27 AM Total Procedure Duration: 0 hours 8 minutes 15 seconds  Findings:      One benign-appearing, intrinsic mild (non-circumferential scarring)       stenosis was found at the gastroesophageal junction. This stenosis       measured 1.5 cm (inner diameter) x less than one cm (in length). The       stenosis was traversed. A TTS dilator was passed through the scope.       Dilation with a 15-16.5-18 mm balloon dilator was performed to 18 mm.       The dilation site was examined and showed mild mucosal disruption.      A 1 cm hiatal hernia was present.      The gastroesophageal flap valve was visualized endoscopically and       classified as Hill Grade III (minimal fold, loose to endoscope, hiatal       hernia likely).      A single 4 mm mucosal papule (nodule) was found in the gastric fundus.       The nodule was Paris classification IIa (superficial, elevated).       Biopsies were taken with a cold forceps for histology.      The examined duodenum was normal. Impression:               - Benign-appearing esophageal stenosis. Dilated.                           - 1 cm hiatal hernia.                           - A single mucosal papule (nodule) found in the                            stomach. Biopsied.                           -  Normal examined duodenum. Moderate Sedation:      Per Anesthesia Care Recommendation:           - Discharge patient to home (ambulatory).                           - Resume previous diet.                           - Await pathology results.                           - Continue present medications.                           - Use Pepcid (famotidine) 20 mg PO nightly. Procedure Code(s):        --- Professional ---                           [phone], Esophagogastroduodenoscopy, flexible,                             transoral; with transendoscopic balloon dilation of                            esophagus (less than 30 mm diameter)                           [id number], 59, Esophagogastroduodenoscopy, flexible,                            transoral; with biopsy, single or multiple Diagnosis Code(s):        --- Professional ---                           K22.2, Esophageal obstruction                           K44.9, Diaphragmatic hernia without obstruction or                            gangrene                           K31.89, Other diseases of stomach and duodenum                           R13.10, Dysphagia, unspecified                           D13.1, Benign neoplasm of stomach CPT copyright 2022 [employer]. All rights reserved. The codes documented in this report are preliminary and upon coder review may  be revised to meet current compliance requirements. Katrinka Blazing, MD Katrinka Blazing,  02/13/2023 9:19:01 AM This report has been signed electronically. Number of Addenda: 0

## 2023-02-13 NOTE — Transfer of Care (Addendum)
Immediate Anesthesia Transfer of Care Note  Patient: MACKIE FIERS  Procedure(s) Performed: ESOPHAGOGASTRODUODENOSCOPY (EGD) WITH PROPOFOL BIOPSY  Patient Location: Endoscopy Unit  Anesthesia Type:General  Level of Consciousness: drowsy and patient cooperative  Airway & Oxygen Therapy: Patient Spontanous Breathing  Post-op Assessment: Report given to RN and Post -op Vital signs reviewed and stable  Post vital signs: Reviewed and stable  Last Vitals:  Vitals Value Taken Time  BP 136/110 02/13/23   0916  Temp    Pulse 70 02/13/23   0916  Resp 15 02/13/23   0916  SpO2 95% 02/13/23   0916    Last Pain: There were no vitals filed for this visit.    Patients Stated Pain Goal: 5 (02/13/23 0753)  Complications: No notable events documented.

## 2023-02-13 NOTE — Anesthesia Postprocedure Evaluation (Signed)
Anesthesia Post Note  Patient: Robert [name]  Procedure(s) Performed: ESOPHAGOGASTRODUODENOSCOPY (EGD) WITH PROPOFOL BIOPSY  Patient location during evaluation: PACU Anesthesia Type: General Level of consciousness: awake and alert Pain management: pain level controlled Vital Signs Assessment: post-procedure vital signs reviewed and stable Respiratory status: spontaneous breathing, nonlabored ventilation, respiratory function stable and patient connected to nasal cannula oxygen Cardiovascular status: blood pressure returned to baseline and stable Postop Assessment: no apparent nausea or vomiting Anesthetic complications: no   There were no known notable events for this encounter.   Last Vitals:  Vitals:   02/13/23 0926 02/13/23 0931  BP: (!) 102/56 (!) 98/55  Pulse: 75 66  Resp: 20 19  Temp:    SpO2: 97% 96%    Last Pain:  Vitals:                 [name]

## 2023-02-13 NOTE — H&P (Signed)
Robert [name] is an 72 y.o. male.   Chief Complaint: follow up LGD and dysphagia HPI: Robert [name] is a 72 y.o. male with with past medical history of coronary artery disease status post stent placement, diabetes, GERD, hypertension, hyperlipidemia, sleep apnea, prostate cancer s/p bracytherapy, who presents for follow up of gastric xanthoma with low-grade dysplasia and dysphagia.   States having some intermittent dysphagia.  The patient denies having any nausea, vomiting, fever, chills, hematochezia, melena, hematemesis, abdominal distention, abdominal pain, diarrhea, jaundice, pruritus or weight loss.   Past Medical History:  Diagnosis Date   Arteriosclerotic cardiovascular disease (ASCVD)    DES to LAD D1 - 2003 in [place]; failed intervention for a totally obstructed RCA at that time; EF of 45%; 12/2009: Equivocal stress nuclear with good exercise tolerance, negative stress EKG, normal EF with mild mid and distal inferior ischemia   Arthritis    Coronary artery disease    Diabetes mellitus, insulin dependent (IDDM), controlled 10/25/2012   HBA1C is 6.9 on 10/22/2012, [employer] 04/26/2020   Diabetes mellitus, type II (HCC)    GERD (gastroesophageal reflux disease)    Hyperlipidemia    Lipid profile in 09/2011:128, 130, 33, 69   Hypertension    Lab  09/2011: Normal CMet ex G-133   Pneumonia    yrs ago   Prostate cancer (HCC)    Rash    right legt below knee using neosporin 2 to 3 x per day, area healing   Sleep apnea    no CPAP, can't tolerate   Tobacco abuse, in remission    30 pack years; quit in 1985   Wears glasses     Past Surgical History:  Procedure Laterality Date   BIOPSY  01/05/2022   Procedure: BIOPSY;  Surgeon: Marguerita Merles, Reuel Boom, MD;  Location: [facility];  Service: Gastroenterology;;   bone spur Left    foot big toe   CARDIAC CATHETERIZATION  2003   DES placed,  done in [place]   COLONOSCOPY  04/03/2008   Negative screening study   COLONOSCOPY  N/A 11/11/2014   Procedure: COLONOSCOPY;  Surgeon: Malissa Hippo, MD;  Location: [facility];  Service: Endoscopy;  Laterality: N/A;  1030   COLONOSCOPY WITH PROPOFOL N/A 01/05/2022   Procedure: COLONOSCOPY WITH PROPOFOL;  Surgeon: Dolores Frame, MD;  Location: [facility];  Service: Gastroenterology;  Laterality: N/A;  730 ASA 2   EPIDIDYMIS SURGERY  04/03/1994   ESOPHAGOGASTRODUODENOSCOPY (EGD) WITH PROPOFOL N/A 01/05/2022   Procedure: ESOPHAGOGASTRODUODENOSCOPY (EGD) WITH PROPOFOL;  Surgeon: Dolores Frame, MD;  Location: [facility];  Service: Gastroenterology;  Laterality: N/A;   HERNIA REPAIR     umbilical as a baby   KNEE ARTHROSCOPY W/ MENISCECTOMY  11/01/2008   Right   POLYPECTOMY  01/05/2022   Procedure: POLYPECTOMY;  Surgeon: Dolores Frame, MD;  Location: [facility];  Service: Gastroenterology;;   PRESSURE ULCER DEBRIDEMENT  04/03/2004   Right lower extremity   RADIOACTIVE SEED IMPLANT N/A 12/21/2022   Procedure: RADIOACTIVE SEED IMPLANT/BRACHYTHERAPY IMPLANT;  Surgeon: Marcine Matar, MD;  Location: [facility];  Service: Urology;  Laterality: N/A;   ROTATOR CUFF REPAIR  07/02/2009   Right   SPACE OAR INSTILLATION N/A 12/21/2022   Procedure: SPACE OAR INSTILLATION;  Surgeon: Marcine Matar, MD;  Location: [facility];  Service: Urology;  Laterality: N/A;   TOTAL KNEE ARTHROPLASTY Right 04/24/2022   Procedure: TOTAL KNEE ARTHROPLASTY;  Surgeon: Ollen Gross, MD;  Location: [facility];  Service: Orthopedics;  Laterality: Right;    Family History  Problem Relation Age of Onset   Diabetes Sister    Arthritis Brother    Diabetes Brother    Arthritis Mother    Cancer Father        Hypernephroma   Social History:  reports that he quit smoking about 39 years ago. His smoking use included cigarettes. He started smoking about 56 years ago. He has a 30 pack-year smoking history. He has never used  smokeless tobacco. He reports that he does not drink alcohol and does not use drugs.  Allergies:  Allergies  Allergen Reactions   Metformin And Related Other (See Comments)    Stomach upset   Oxycodone     "Makes me foolish, did not help with pain"   Rosuvastatin Other (See Comments)    Adverse GI symptoms   Codeine     "Did not work" and "made me feel foolish"    Medications Prior to Admission  Medication Sig Dispense Refill   acetaminophen (TYLENOL) 650 MG CR tablet Take 650 mg by mouth every 8 (eight) hours as needed for pain.     atorvastatin (LIPITOR) 40 MG tablet Take 1 tablet (40 mg total) by mouth daily. (Patient taking differently: Take 40 mg by mouth at bedtime.) 90 tablet 3   insulin glargine (LANTUS SOLOSTAR) 100 UNIT/ML Solostar Pen Inject 40 Units into the skin at bedtime. 45 mL 3   Insulin Pen Needle (TECHLITE PEN NEEDLES) 31G X 8 MM MISC Use as directed once daily 100 each 0   loratadine (CLARITIN) 10 MG tablet Take 1 tablet (10 mg total) by mouth daily. 30 tablet 1   metoprolol succinate (TOPROL-XL) 50 MG 24 hr tablet Take 1 tablet (50 mg total) by mouth daily with or immediately following a meal (Patient taking differently: Take 50 mg by mouth at bedtime.) 90 tablet 3   neomycin-bacitracin-polymyxin (NEOSPORIN) OINT Apply 1 Application topically 3 (three) times daily. To rash below right knee     olmesartan (BENICAR) 20 MG tablet Take 1 tablet (20 mg total) by mouth daily. (Patient taking differently: Take 20 mg by mouth at bedtime.) 90 tablet 3   omeprazole (PRILOSEC) 40 MG capsule Take 1 capsule (40 mg total) by mouth daily. 90 capsule 3   Vitamin D, Ergocalciferol, (DRISDOL) 1.25 MG (50000 UNIT) CAPS capsule Take 1 capsule (50,000 Units total) by mouth every 7 (seven) days. 12 capsule 1   aspirin EC 81 MG tablet Take 81 mg by mouth daily.     Continuous Glucose Receiver (FREESTYLE LIBRE 3 READER) DEVI Use as directed. 1 each 0   Continuous Glucose Sensor (FREESTYLE  LIBRE 3 SENSOR) MISC Place 1 sensor on the skin every 14 days. Use to check glucose continuously 6 each 3   Dulaglutide (TRULICITY) 1.5 MG/0.5ML SOAJ Inject 1.5 mg into the skin once a week. (Patient taking differently: Inject 1.5 mg into the skin once a week. friday) 6 mL 3   nitroGLYCERIN (NITROSTAT) 0.4 MG SL tablet Place 1 tablet (0.4 mg total) under the tongue every 5 (five) minutes as needed for chest pain. 25 tablet 3   sildenafil (VIAGRA) 100 MG tablet Take 0.5-1 tablets (50-100 mg total) by mouth as needed. 30 tablet 11    Results for orders placed or performed during the hospital encounter of 02/13/23 (from the past 48 hour(s))  Glucose, capillary     Status: Abnormal   Collection Time: 02/13/23  7:54 AM  Result Value Ref Range   Glucose-Capillary 110 (H) 70 - 99 mg/dL    Comment: Glucose reference range applies only to samples taken after fasting for at least 8 hours.   No results found.  Review of Systems  HENT:  Positive for trouble swallowing.   All other systems reviewed and are negative.   Blood pressure 124/68, pulse 69, temperature 98.1 F (36.7 C), resp. rate 15, height 5\' 9"  (1.753 m), weight 93.9 kg, SpO2 98%. Physical Exam  GENERAL: The patient is AO x3, in no acute distress. HEENT: Head is normocephalic and atraumatic. EOMI are intact. Mouth is well hydrated and without lesions. NECK: Supple. No masses LUNGS: Clear to auscultation. No presence of rhonchi/wheezing/rales. Adequate chest expansion HEART: RRR, normal s1 and s2. ABDOMEN: Soft, nontender, no guarding, no peritoneal signs, and nondistended. BS +. No masses. EXTREMITIES: Without any cyanosis, clubbing, rash, lesions or edema. NEUROLOGIC: AOx3, no focal motor deficit. SKIN: no jaundice, no rashes  Assessment/Plan Robert [name] is a 72 y.o. male with with past medical history of coronary artery disease status post stent placement, diabetes, GERD, hypertension, hyperlipidemia, sleep apnea, prostate  cancer s/p bracytherapy, who presents for follow up of gastric xanthoma with low-grade dysplasia and dysphagia.  Will proceed with Egd .  Dolores Frame, MD 02/13/2023, 8:47 AM

## 2023-02-13 NOTE — Discharge Instructions (Signed)
You are being discharged to home.  Resume your previous diet.  We are waiting for your pathology results.  Continue your present medications.  Take Pepcid (famotidine) 20 mg by mouth once every night.

## 2023-02-14 ENCOUNTER — Ambulatory Visit (INDEPENDENT_AMBULATORY_CARE_PROVIDER_SITE_OTHER): Payer: PPO

## 2023-02-14 VITALS — Ht 69.0 in | Wt 207.0 lb

## 2023-02-14 DIAGNOSIS — Z0000 Encounter for general adult medical examination without abnormal findings: Secondary | ICD-10-CM | POA: Diagnosis not present

## 2023-02-14 LAB — SURGICAL PATHOLOGY

## 2023-02-14 NOTE — Progress Notes (Signed)
Subjective:   Robert [name] is a 72 y.o. male who presents for Medicare Annual/Subsequent preventive examination.  Visit Complete: Virtual I connected with  Robert [name] on 02/14/23 by a audio enabled telemedicine application and verified that I am speaking with the correct person using two identifiers.  Patient Location: Home  Provider Location: Home Office  I discussed the limitations of evaluation and management by telemedicine. The patient expressed understanding and agreed to proceed.  Vital Signs: Because this visit was a virtual/telehealth visit, some criteria may be missing or patient reported. Any vitals not documented were not able to be obtained and vitals that have been documented are patient reported.  Cardiac Risk Factors include: advanced age (>21men, >51 women);diabetes mellitus;dyslipidemia;male gender;hypertension     Objective:    Today's Vitals   02/14/23 1227  Weight: 207 lb (93.9 kg)  Height: 5\' 9"  (1.753 m)   Body mass index is 30.57 kg/m.     02/14/2023   12:35 PM 02/13/2023    7:45 AM 01/10/2023    9:02 AM 12/21/2022    8:04 AM 09/21/2022    9:15 AM 09/04/2022   10:47 AM 04/24/2022    3:00 PM  Advanced Directives  Does Patient Have a Medical Advance Directive? No No No No No No No  Would patient like information on creating a medical advance directive? Yes (MAU/Ambulatory/Procedural Areas - Information given) No - Guardian declined Yes (ED - Information included in AVS) No - Patient declined Yes (ED - Information included in AVS)  No - Patient declined    Current Medications (verified) Outpatient Encounter Medications as of 02/14/2023  Medication Sig   acetaminophen (TYLENOL) 650 MG CR tablet Take 650 mg by mouth every 8 (eight) hours as needed for pain.   aspirin EC 81 MG tablet Take 81 mg by mouth daily.   atorvastatin (LIPITOR) 40 MG tablet Take 1 tablet (40 mg total) by mouth daily. (Patient taking differently: Take 40 mg by mouth at  bedtime.)   Continuous Glucose Receiver (FREESTYLE LIBRE 3 READER) DEVI Use as directed.   Continuous Glucose Sensor (FREESTYLE LIBRE 3 SENSOR) MISC Place 1 sensor on the skin every 14 days. Use to check glucose continuously   Dulaglutide (TRULICITY) 1.5 MG/0.5ML SOAJ Inject 1.5 mg into the skin once a week. (Patient taking differently: Inject 1.5 mg into the skin once a week. friday)   famotidine (PEPCID) 20 MG tablet Take 1 tablet (20 mg total) by mouth at bedtime.   insulin glargine (LANTUS SOLOSTAR) 100 UNIT/ML Solostar Pen Inject 40 Units into the skin at bedtime.   Insulin Pen Needle (TECHLITE PEN NEEDLES) 31G X 8 MM MISC Use as directed once daily   loratadine (CLARITIN) 10 MG tablet Take 1 tablet (10 mg total) by mouth daily.   metoprolol succinate (TOPROL-XL) 50 MG 24 hr tablet Take 1 tablet (50 mg total) by mouth daily with or immediately following a meal (Patient taking differently: Take 50 mg by mouth at bedtime.)   neomycin-bacitracin-polymyxin (NEOSPORIN) OINT Apply 1 Application topically 3 (three) times daily. To rash below right knee   nitroGLYCERIN (NITROSTAT) 0.4 MG SL tablet Place 1 tablet (0.4 mg total) under the tongue every 5 (five) minutes as needed for chest pain.   olmesartan (BENICAR) 20 MG tablet Take 1 tablet (20 mg total) by mouth daily. (Patient taking differently: Take 20 mg by mouth at bedtime.)   omeprazole (PRILOSEC) 40 MG capsule Take 1 capsule (40 mg total) by mouth daily.  sildenafil (VIAGRA) 100 MG tablet Take 0.5-1 tablets (50-100 mg total) by mouth as needed.   Vitamin D, Ergocalciferol, (DRISDOL) 1.25 MG (50000 UNIT) CAPS capsule Take 1 capsule (50,000 Units total) by mouth every 7 (seven) days.   No facility-administered encounter medications on file as of 02/14/2023.    Allergies (verified) Metformin and related, Oxycodone, Rosuvastatin, and Codeine   History: Past Medical History:  Diagnosis Date   Allergy    Arteriosclerotic cardiovascular  disease (ASCVD)    DES to LAD D1 - 2003 in [place]; failed intervention for a totally obstructed RCA at that time; EF of 45%; 12/2009: Equivocal stress nuclear with good exercise tolerance, negative stress EKG, normal EF with mild mid and distal inferior ischemia   Arthritis    Coronary artery disease    Diabetes mellitus, insulin dependent (IDDM), controlled 10/25/2012   HBA1C is 6.9 on 10/22/2012, Phreesia 04/26/2020   Diabetes mellitus, type II (HCC)    GERD (gastroesophageal reflux disease)    Hyperlipidemia    Lipid profile in 09/2011:128, 130, 33, 69   Hypertension    Lab  09/2011: Normal CMet ex G-133   Pneumonia    yrs ago   Prostate cancer (HCC)    Rash    right legt below knee using neosporin 2 to 3 x per day, area healing   Sleep apnea    no CPAP, can't tolerate   Tobacco abuse, in remission    30 pack years; quit in 1985   Wears glasses    Past Surgical History:  Procedure Laterality Date   BIOPSY  01/05/2022   Procedure: BIOPSY;  Surgeon: Marguerita Merles, Reuel Boom, MD;  [facility];  Service: Gastroenterology;;   bone spur Left    foot big toe   CARDIAC CATHETERIZATION  2003   DES placed,  done in [place]   COLONOSCOPY  04/03/2008   Negative screening study   COLONOSCOPY N/A 11/11/2014   Procedure: COLONOSCOPY;  Surgeon: Malissa Hippo, MD;  [facility]: [facility];  Service: Endoscopy;  Laterality: N/A;  1030   COLONOSCOPY WITH PROPOFOL N/A 01/05/2022   Procedure: COLONOSCOPY WITH PROPOFOL;  Surgeon: Dolores Frame, MD;  [facility]: [facility];  Service: Gastroenterology;  Laterality: N/A;  730 ASA 2   EPIDIDYMIS SURGERY  04/03/1994   ESOPHAGOGASTRODUODENOSCOPY (EGD) WITH PROPOFOL N/A 01/05/2022   Procedure: ESOPHAGOGASTRODUODENOSCOPY (EGD) WITH PROPOFOL;  Surgeon: Dolores Frame, MD;  [facility];  Service: Gastroenterology;  Laterality: N/A;   HERNIA REPAIR     umbilical as a baby   KNEE ARTHROSCOPY W/ MENISCECTOMY   11/01/2008   Right   POLYPECTOMY  01/05/2022   Procedure: POLYPECTOMY;  Surgeon: Dolores Frame, MD;  [facility];  Service: Gastroenterology;;   PRESSURE ULCER DEBRIDEMENT  04/03/2004   Right lower extremity   RADIOACTIVE SEED IMPLANT N/A 12/21/2022   Procedure: RADIOACTIVE SEED IMPLANT/BRACHYTHERAPY IMPLANT;  Surgeon: Marcine Matar, MD;  Location[facility];  Service: Urology;  Laterality: N/A;   ROTATOR CUFF REPAIR  07/02/2009   Right   SPACE OAR INSTILLATION N/A 12/21/2022   Procedure: SPACE OAR INSTILLATION;  Surgeon: Marcine Matar, MD;  Location: [facility];  Service: Urology;  Laterality: N/A;   TOTAL KNEE ARTHROPLASTY Right 04/24/2022   Procedure: TOTAL KNEE ARTHROPLASTY;  Surgeon: Ollen Gross, MD;  Location: [facility];  Service: Orthopedics;  Laterality: Right;   Family History  Problem Relation Age of Onset   Diabetes Sister  Hypertension Sister    Obesity Sister    Stroke Sister    Arthritis Brother    Diabetes Brother    Cancer Brother    Arthritis Mother    Cancer Father        Hypernephroma   Arthritis Father    Social History   Socioeconomic History   Marital status: Married    Spouse name: Not on file   Number of children: 2   Years of education: Not on file   Highest education level: Associate degree: occupational, Scientist, product/process development, or vocational program  Occupational History   Occupation: Retired  Tobacco Use   Smoking status: Former    Current packs/day: 0.00    Average packs/day: 1 pack/day for 30.0 years (30.0 ttl pk-yrs)    Types: Cigarettes    Start date: 04/03/1966    Quit date: 09/02/1983    Years since quitting: 39.4   Smokeless tobacco: Never  Vaping Use   Vaping status: Never Used  Substance and Sexual Activity   Alcohol use: No    Comment: Former Therapist, nutritional -discontinued use in 2005   Drug use: No   Sexual activity: Yes  Other Topics Concern   Not on file  Social History  Narrative   Not on file   Social Determinants of Health   Financial Resource Strain: Low Risk  (02/12/2023)   Overall Financial Resource Strain (CARDIA)    Difficulty of Paying Living Expenses: Not hard at all  Food Insecurity: No Food Insecurity (02/12/2023)   Hunger Vital Sign    Worried About Running Out of Food in the Last Year: Never true    Ran Out of Food in the Last Year: Never true  Transportation Needs: No Transportation Needs (02/12/2023)   PRAPARE - Administrator, Civil Service (Medical): No    Lack of Transportation (Non-Medical): No  Physical Activity: Inactive (02/12/2023)   Exercise Vital Sign    Days of Exercise per Week: 0 days    Minutes of Exercise per Session: 0 min  Stress: Stress Concern Present (02/12/2023)   [employer] of Occupational Health - Occupational Stress Questionnaire    Feeling of Stress : To some extent  Social Connections: Socially Integrated (02/12/2023)   Social Connection and Isolation Panel [NHANES]    Frequency of Communication with Friends and Family: More than three times a week    Frequency of Social Gatherings with Friends and Family: More than three times a week    Attends Religious Services: More than 4 times per year    Active Member of [employer] or Organizations: Yes    Attends Engineer, structural: More than 4 times per year    Marital Status: Married    Tobacco Counseling Counseling given: Not Answered   Clinical Intake:  Pre-visit preparation completed: Yes  Pain : No/denies pain     Diabetes: Yes CBG done?: No Did pt. bring in CBG monitor from home?: No  How often do you need to have someone help you when you read instructions, pamphlets, or other written materials from your doctor or pharmacy?: 1 - Never  Interpreter Needed?: No  Information entered by :: Kandis Fantasia LPN   Activities of Daily Living    02/12/2023    6:30 PM 12/21/2022    8:07 AM  In your present state of  health, do you have any difficulty performing the following activities:  Hearing? 0 0  Vision? 0 0  Difficulty concentrating or making decisions? 0  0  Walking or climbing stairs? 0 0  Dressing or bathing? 0 0  Doing errands, shopping? 0   Preparing Food and eating ? N   Using the Toilet? N   In the past six months, have you accidently leaked urine? Y   Do you have problems with loss of bowel control? N   Managing your Medications? N   Managing your Finances? N   Housekeeping or managing your Housekeeping? N     Patient Care Team: Kerri Perches, MD as PCP - General Branch, Dorothe Pea, MD as PCP - Cardiology (Cardiology) Gavin Pound, [facility] (Inactive) (Pharmacist) Cherlyn Cushing, RN as Oncology Nurse Navigator Marcine Matar, MD as Consulting Physician (Urology) Margaretmary Dys, MD as Consulting Physician (Radiation Oncology) Maryclare Labrador, RN as Registered Nurse Marcine Matar, MD as Consulting Physician (Urology)  Indicate any recent Medical Services you may have received from other than [facility] providers in the past year (date may be approximate).     Assessment:   This is a routine wellness examination for Stamps.  Hearing/Vision screen Hearing Screening - Comments:: Denies hearing difficulties   Vision Screening - Comments:: Wears rx glasses - up to date with routine eye exams with Dr. Charise Killian     Goals Addressed             This Visit's Progress    Remain active and independent        Depression Screen    02/14/2023   12:33 PM 11/21/2022    8:48 AM 09/04/2022   10:57 AM 07/21/2022   11:37 AM 07/21/2022   10:49 AM 02/17/2022   10:45 AM 11/15/2021   10:11 AM  PHQ 2/9 Scores  PHQ - 2 Score 2 2 1  0 1 0 0  PHQ- 9 Score 6 6         Fall Risk    02/12/2023    6:30 PM 11/21/2022    8:48 AM 07/21/2022   10:49 AM 02/17/2022   10:45 AM 12/29/2021   10:18 PM  Fall Risk   Falls in the past year? 1 0 0 0 1  Number falls in past yr: 0 0 0 0 0   Injury with Fall? 0 0 0 0 0  Risk for fall due to : No Fall Risks No Fall Risks  History of fall(s)   Follow up Falls prevention discussed;Education provided;Falls evaluation completed Falls evaluation completed  Falls evaluation completed     MEDICARE RISK AT HOME: Medicare Risk at Home Any stairs in or around the home?: Yes If so, are there any without handrails?: No Home free of loose throw rugs in walkways, pet beds, electrical cords, etc?: No Adequate lighting in your home to reduce risk of falls?: Yes Life alert?: No Use of a cane, walker or w/c?: No Grab bars in the bathroom?: Yes Shower chair or bench in shower?: No Elevated toilet seat or a handicapped toilet?: Yes  TIMED UP AND GO:  Was the test performed?  No    Cognitive Function:        02/14/2023   12:41 PM 01/02/2022    4:38 PM 12/03/2020    2:10 PM 11/25/2019    2:41 PM 11/20/2018    3:06 PM  6CIT Screen  What Year? 0 points 0 points 0 points 0 points 0 points  What month? 0 points 0 points 0 points 0 points 0 points  What time? 0 points 0 points 0 points 0 points 0 points  Count back from 20 0 points 0 points 0 points 0 points 0 points  Months in reverse 0 points 0 points 0 points 0 points 0 points  Repeat phrase 0 points 0 points 2 points 0 points 0 points  Total Score 0 points 0 points 2 points 0 points 0 points    Immunizations Immunization History  Administered Date(s) Administered   Fluad Quad(high Dose 65+) 01/01/2019, 12/17/2020, 12/27/2021   Influenza Split 02/14/2011, 12/18/2011   Influenza Whole 12/25/2006, 12/23/2008, 01/13/2010   Influenza,inj,Quad PF,6+ Mos 01/10/2013, 12/02/2013, 01/26/2015, 12/09/2015, 11/30/2016, 12/31/2017, 12/13/2019   Influenza-Unspecified 01/23/2023   Moderna Covid-19 Vaccine Bivalent Booster 49yrs & up 01/28/2021, 07/28/2022   Moderna SARS-COV2 Booster Vaccination 01/12/2022   PFIZER(Purple Top)SARS-COV-2 Vaccination 05/01/2019, 05/23/2019, 01/08/2020, 07/12/2020    Pneumococcal Conjugate-13 11/10/2013   Pneumococcal Polysaccharide-23 03/10/2004, 01/13/2010, 11/30/2016   Rsv, Bivalent, Protein Subunit Rsvpref,pf Verdis Frederickson) 12/27/2021   Tdap 12/18/2011, 07/28/2022   Zoster Recombinant(Shingrix) 12/17/2019, 08/03/2020   Zoster, Live 12/18/2011    TDAP status: Up to date  Flu Vaccine status: Up to date  Pneumococcal vaccine status: Up to date  Covid-19 vaccine status: Information provided on how to obtain vaccines.   Qualifies for Shingles Vaccine? Yes   Zostavax completed Yes   Shingrix Completed?: Yes  Screening Tests Health Maintenance  Topic Date Due   COVID-19 Vaccine (7 - 2023-24 season) 12/03/2022   Diabetic kidney evaluation - Urine ACR  02/18/2023   HEMOGLOBIN A1C  05/19/2023   OPHTHALMOLOGY EXAM  06/22/2023   Diabetic kidney evaluation - eGFR measurement  11/16/2023   FOOT EXAM  01/24/2024   Medicare Annual Wellness (AWV)  02/14/2024   Colonoscopy  01/05/2029   DTaP/Tdap/Td (3 - Td or Tdap) 07/27/2032   Pneumonia Vaccine 20+ Years old  Completed   INFLUENZA VACCINE  Completed   Hepatitis C Screening  Completed   Zoster Vaccines- Shingrix  Completed   HPV VACCINES  Aged Out    Health Maintenance  Health Maintenance Due  Topic Date Due   COVID-19 Vaccine (7 - 2023-24 season) 12/03/2022   Diabetic kidney evaluation - Urine ACR  02/18/2023    Colorectal cancer screening: Type of screening: Colonoscopy. Completed 01/05/22. Repeat every 10 years  Lung Cancer Screening: (Low Dose CT Chest recommended if Age 36-80 years, 20 pack-year currently smoking OR have quit w/in 15years.) does not qualify.   Lung Cancer Screening Referral: n/a  Additional Screening:  Hepatitis C Screening: does qualify; Completed 12/31/14  Vision Screening: Recommended annual ophthalmology exams for early detection of glaucoma and other disorders of the eye. Is the patient up to date with their annual eye exam?  Yes  Who is the provider or what is  the name of the office in which the patient attends annual eye exams? [facility].  If pt is not established with a provider, would they like to be referred to a provider to establish care? No .   Dental Screening: Recommended annual dental exams for proper oral hygiene  Diabetic Foot Exam: Diabetic Foot Exam: Completed 01/24/23  Community Resource Referral / Chronic Care Management: CRR required this visit?  No   CCM required this visit?  No     Plan:     I have personally reviewed and noted the following in the patient's chart:   Medical and social history Use of alcohol, tobacco or illicit drugs  Current medications and supplements including opioid prescriptions. Patient is not currently taking opioid prescriptions. Functional ability and status Nutritional status Physical activity  Advanced directives List of other physicians Hospitalizations, surgeries, and ER visits in previous 12 months Vitals Screenings to include cognitive, depression, and falls Referrals and appointments  In addition, I have reviewed and discussed with patient certain preventive protocols, quality metrics, and best practice recommendations. A written personalized care plan for preventive services as well as general preventive health recommendations were provided to patient.     Kandis Fantasia [place], [place]   16/01/9603   After Visit Summary: (MyChart) Due to this being a telephonic visit, the after visit summary with patients personalized plan was offered to patient via MyChart   Nurse Notes: No concerns at this time

## 2023-02-14 NOTE — Patient Instructions (Signed)
Robert [name] , Thank you for taking time to come for your Medicare Wellness Visit. I appreciate your ongoing commitment to your health goals. Please review the following plan we discussed and let me know if I can assist you in the future.   Referrals/Orders/Follow-Ups/Clinician Recommendations: Aim for 30 minutes of exercise or brisk walking, 6-8 glasses of water, and 5 servings of fruits and vegetables each day.  This is a list of the screening recommended for you and due dates:  Health Maintenance  Topic Date Due   COVID-19 Vaccine (7 - 2023-24 season) 12/03/2022   Yearly kidney health urinalysis for diabetes  02/18/2023   Hemoglobin A1C  05/19/2023   Eye exam for diabetics  06/22/2023   Yearly kidney function blood test for diabetes  11/16/2023   Complete foot exam   01/24/2024   Medicare Annual Wellness Visit  02/14/2024   Colon Cancer Screening  01/05/2029   DTaP/Tdap/Td vaccine (3 - Td or Tdap) 07/27/2032   Pneumonia Vaccine  Completed   Flu Shot  Completed   Hepatitis C Screening  Completed   Zoster (Shingles) Vaccine  Completed   HPV Vaccine  Aged Out    Advanced directives: (ACP Link)Information on Advanced Care Planning can be found at [facility] [place] Advance Health Care Directives Advance Health Care Directives ([email or web address]   Next Medicare Annual Wellness Visit scheduled for next year: Yes

## 2023-02-15 ENCOUNTER — Encounter: Payer: Self-pay | Admitting: *Deleted

## 2023-02-15 ENCOUNTER — Encounter (INDEPENDENT_AMBULATORY_CARE_PROVIDER_SITE_OTHER): Payer: Self-pay | Admitting: *Deleted

## 2023-02-15 ENCOUNTER — Inpatient Hospital Stay: Payer: PPO | Admitting: *Deleted

## 2023-02-15 ENCOUNTER — Inpatient Hospital Stay: Payer: PPO | Attending: Adult Health | Admitting: *Deleted

## 2023-02-15 DIAGNOSIS — C61 Malignant neoplasm of prostate: Secondary | ICD-10-CM

## 2023-02-15 NOTE — Progress Notes (Signed)
SCP reviewed and completed. Pt will get post-tx PSA labs in January at [facility].

## 2023-02-19 ENCOUNTER — Encounter (HOSPITAL_COMMUNITY): Payer: Self-pay | Admitting: Gastroenterology

## 2023-03-06 DIAGNOSIS — E1159 Type 2 diabetes mellitus with other circulatory complications: Secondary | ICD-10-CM | POA: Diagnosis not present

## 2023-03-07 LAB — HEMOGLOBIN A1C
Est. average glucose Bld gHb Est-mCnc: 160 mg/dL
Hgb A1c MFr Bld: 7.2 % — ABNORMAL HIGH (ref 4.8–5.6)

## 2023-03-07 LAB — BMP8+EGFR
BUN/Creatinine Ratio: 9 — ABNORMAL LOW (ref 10–24)
BUN: 11 mg/dL (ref 8–27)
CO2: 23 mmol/L (ref 20–29)
Calcium: 9 mg/dL (ref 8.6–10.2)
Chloride: 104 mmol/L (ref 96–106)
Creatinine, Ser: 1.27 mg/dL (ref 0.76–1.27)
Glucose: 82 mg/dL (ref 70–99)
Potassium: 4.8 mmol/L (ref 3.5–5.2)
Sodium: 142 mmol/L (ref 134–144)
eGFR: 60 mL/min/{1.73_m2} (ref >59)

## 2023-03-08 ENCOUNTER — Ambulatory Visit (INDEPENDENT_AMBULATORY_CARE_PROVIDER_SITE_OTHER): Payer: PPO | Admitting: Family Medicine

## 2023-03-08 ENCOUNTER — Other Ambulatory Visit (HOSPITAL_COMMUNITY): Payer: Self-pay

## 2023-03-08 ENCOUNTER — Other Ambulatory Visit: Payer: Self-pay

## 2023-03-08 ENCOUNTER — Encounter: Payer: Self-pay | Admitting: Family Medicine

## 2023-03-08 VITALS — BP 128/82 | HR 68 | Ht 69.0 in | Wt 207.1 lb

## 2023-03-08 DIAGNOSIS — M79641 Pain in right hand: Secondary | ICD-10-CM

## 2023-03-08 DIAGNOSIS — I10 Essential (primary) hypertension: Secondary | ICD-10-CM | POA: Diagnosis not present

## 2023-03-08 DIAGNOSIS — Z0000 Encounter for general adult medical examination without abnormal findings: Secondary | ICD-10-CM | POA: Diagnosis not present

## 2023-03-08 DIAGNOSIS — E785 Hyperlipidemia, unspecified: Secondary | ICD-10-CM

## 2023-03-08 DIAGNOSIS — M79642 Pain in left hand: Secondary | ICD-10-CM | POA: Diagnosis not present

## 2023-03-08 DIAGNOSIS — E1169 Type 2 diabetes mellitus with other specified complication: Secondary | ICD-10-CM

## 2023-03-08 DIAGNOSIS — E1159 Type 2 diabetes mellitus with other circulatory complications: Secondary | ICD-10-CM | POA: Diagnosis not present

## 2023-03-08 DIAGNOSIS — E559 Vitamin D deficiency, unspecified: Secondary | ICD-10-CM

## 2023-03-08 DIAGNOSIS — Z794 Long term (current) use of insulin: Secondary | ICD-10-CM

## 2023-03-08 MED ORDER — TRULICITY 1.5 MG/0.5ML ~~LOC~~ SOAJ
1.5000 mg | SUBCUTANEOUS | 3 refills | Status: DC
  Filled 2023-03-08: qty 2, 28d supply, fill #0
  Filled 2023-04-05: qty 2, 28d supply, fill #1
  Filled 2023-04-28: qty 2, 28d supply, fill #2
  Filled 2023-05-30: qty 6, 84d supply, fill #3
  Filled 2023-08-20: qty 6, 84d supply, fill #4
  Filled 2023-11-16: qty 6, 84d supply, fill #5

## 2023-03-08 NOTE — Assessment & Plan Note (Signed)
Refer rheumatology for eval, impression is RA

## 2023-03-08 NOTE — Assessment & Plan Note (Signed)
Annual exam as documented. . Immunization and cancer screening needs are specifically addressed at this visit.  

## 2023-03-08 NOTE — Patient Instructions (Signed)
Follow-up in 4 and half months, call if you need me sooner.  Urine ACR today.  You are referred to rheumatology regarding bilateral hand pain and weakness.  Please start exercise commitment for 30 minutes 5 days/week.  No change in medication at this time.  Best for 2025.!  Fasting CBC lipid CMP and EGFR HbA1c TSH and vitamin D to be drawn 1 week before next visit.  Nurse pls add rheumatoid factor to recent lab  Thanks for choosing [facility], we consider it a privelige to serve you.

## 2023-03-08 NOTE — Progress Notes (Signed)
   Robert [name]     MRN: [id number]      DOB: [birth year]  Chief Complaint  Patient presents with   Annual Exam    CPE hip pain sees ortho next month     HPI: Patient is in for annual physical exam. C/o right hip apin and already has Ortho appointment for this.c/o bilateral progressive hand pain and weakness, marked deformity of joints of digits Recent labs, if available are reviewed. Immunization is reviewed , and  updated if needed.    PE; BP 128/82   Pulse 68   Ht 5\' 9"  (1.753 m)   Wt 207 lb 1.9 oz (93.9 kg)   SpO2 95%   BMI 30.59 kg/m   Pleasant male, alert and oriented x 3, in no cardio-pulmonary distress. Afebrile. HEENT No facial trauma or asymetry. Sinuses non tender. EOMI External ears normal,  Neck: supple, no adenopathy,JVD or thyromegaly.No bruits.  Chest: Clear to ascultation bilaterally.No crackles or wheezes. Non tender to palpation  Cardiovascular system; Heart sounds normal,  S1 and  S2 ,no S3.  No murmur, or thrill. Apical beat not displaced Peripheral pulses normal.  Abdomen: Soft, non tender, no organomegaly or masses. No bruits. Bowel sounds normal. No guarding, tenderness or rebound.    Musculoskeletal exam: Decreased ROM of spine, hips , and knees.  deformity ,swelling  noted.of the IP joints bilaterally with nodules present No muscle wasting or atrophy.   Neurologic: Cranial nerves 2 to 12 intact. Power, tone ,sensation and reflexes normal throughout. No disturbance in gait. No tremor.  Skin: Intact, no ulceration, erythema , scaling or rash noted. Pigmentation normal throughout  Psych; Normal mood and affect. Judgement and concentration normal   Assessment & Plan:  Encounter for annual physical exam Annual exam as documented. Immunization and cancer screening needs are specifically addressed at this visit.   Bilateral hand pain Refer rheumatology for eval, impression is RA

## 2023-03-10 ENCOUNTER — Encounter: Payer: Self-pay | Admitting: Family Medicine

## 2023-03-12 ENCOUNTER — Telehealth (INDEPENDENT_AMBULATORY_CARE_PROVIDER_SITE_OTHER): Payer: Self-pay

## 2023-03-12 NOTE — Telephone Encounter (Signed)
Please ask him to take Benefiber 1 tablespoon daily for next couple of weeks. If still having diarrhea on Wednesday, please order GI pathogen panel and C. Diff testing. Thanks

## 2023-03-12 NOTE — Telephone Encounter (Signed)
Patient called today says he had been started on famotidine and taking it for three weeks, and now having issues with diarrhea, going to the restroom with watery stools up to 3 times per day. He says he stopped the famotidine on Thursday,but still having issues with watery stools and belching. He is not currently taking any antidiarrheals. Please advise.

## 2023-03-12 NOTE — Telephone Encounter (Signed)
I spoke with the patient and made him aware  Please ask him to take Benefiber 1 tablespoon daily for next couple of weeks. If still having diarrhea on Wednesday, please order GI pathogen panel and C. Diff testing. Thanks  Patient states understanding and will call us back to see if still having issues on Wednesday, if so we will order stool testing.

## 2023-03-13 ENCOUNTER — Ambulatory Visit: Payer: PPO | Admitting: Family Medicine

## 2023-03-13 LAB — MICROALBUMIN / CREATININE URINE RATIO
Creatinine, Urine: 166.2 mg/dL
Microalb/Creat Ratio: 29 mg/g{creat} (ref 0–29)
Microalbumin, Urine: 48.6 ug/mL

## 2023-03-26 ENCOUNTER — Other Ambulatory Visit: Payer: Self-pay | Admitting: Cardiology

## 2023-03-26 ENCOUNTER — Other Ambulatory Visit (HOSPITAL_COMMUNITY): Payer: Self-pay

## 2023-03-26 MED ORDER — OLMESARTAN MEDOXOMIL 20 MG PO TABS
20.0000 mg | ORAL_TABLET | Freq: Every day | ORAL | 3 refills | Status: DC
  Filled 2023-03-26: qty 90, 90d supply, fill #0
  Filled 2023-07-03: qty 90, 90d supply, fill #1
  Filled 2023-10-07: qty 90, 90d supply, fill #2
  Filled 2024-01-04: qty 90, 90d supply, fill #3

## 2023-03-26 MED ORDER — ATORVASTATIN CALCIUM 40 MG PO TABS
40.0000 mg | ORAL_TABLET | Freq: Every day | ORAL | 3 refills | Status: DC
  Filled 2023-03-26: qty 90, 90d supply, fill #0
  Filled 2023-07-03: qty 90, 90d supply, fill #1
  Filled 2023-10-07: qty 90, 90d supply, fill #2
  Filled 2024-01-04: qty 90, 90d supply, fill #3

## 2023-04-05 ENCOUNTER — Other Ambulatory Visit (HOSPITAL_COMMUNITY): Payer: Self-pay

## 2023-04-05 ENCOUNTER — Other Ambulatory Visit: Payer: PPO

## 2023-04-05 DIAGNOSIS — Z09 Encounter for follow-up examination after completed treatment for conditions other than malignant neoplasm: Secondary | ICD-10-CM | POA: Diagnosis not present

## 2023-04-05 DIAGNOSIS — C61 Malignant neoplasm of prostate: Secondary | ICD-10-CM

## 2023-04-05 DIAGNOSIS — Z923 Personal history of irradiation: Secondary | ICD-10-CM

## 2023-04-06 LAB — PSA, TOTAL AND FREE
PSA, Free Pct: 11.4 %
PSA, Free: 0.16 ng/mL
Prostate Specific Ag, Serum: 1.4 ng/mL (ref 0.0–4.0)

## 2023-04-11 ENCOUNTER — Other Ambulatory Visit: Payer: Self-pay | Admitting: Family Medicine

## 2023-04-12 ENCOUNTER — Other Ambulatory Visit: Payer: Self-pay

## 2023-04-12 ENCOUNTER — Other Ambulatory Visit (HOSPITAL_BASED_OUTPATIENT_CLINIC_OR_DEPARTMENT_OTHER): Payer: Self-pay

## 2023-04-12 DIAGNOSIS — Z96651 Presence of right artificial knee joint: Secondary | ICD-10-CM | POA: Diagnosis not present

## 2023-04-12 MED ORDER — INSUPEN PEN NEEDLES 31G X 8 MM MISC
0 refills | Status: DC
  Filled 2023-04-12: qty 100, 100d supply, fill #0

## 2023-04-18 ENCOUNTER — Other Ambulatory Visit (HOSPITAL_COMMUNITY): Payer: Self-pay

## 2023-04-28 ENCOUNTER — Other Ambulatory Visit (HOSPITAL_COMMUNITY): Payer: Self-pay

## 2023-05-08 ENCOUNTER — Ambulatory Visit (INDEPENDENT_AMBULATORY_CARE_PROVIDER_SITE_OTHER): Payer: PPO | Admitting: Podiatry

## 2023-05-08 ENCOUNTER — Encounter: Payer: Self-pay | Admitting: Podiatry

## 2023-05-08 VITALS — Ht 69.0 in | Wt 210.0 lb

## 2023-05-08 DIAGNOSIS — Q828 Other specified congenital malformations of skin: Secondary | ICD-10-CM

## 2023-05-08 DIAGNOSIS — B351 Tinea unguium: Secondary | ICD-10-CM

## 2023-05-08 DIAGNOSIS — E1159 Type 2 diabetes mellitus with other circulatory complications: Secondary | ICD-10-CM | POA: Diagnosis not present

## 2023-05-08 DIAGNOSIS — M79675 Pain in left toe(s): Secondary | ICD-10-CM

## 2023-05-08 DIAGNOSIS — M79674 Pain in right toe(s): Secondary | ICD-10-CM

## 2023-05-14 NOTE — Progress Notes (Signed)
 Subjective:  Patient ID: Robert [name], male    DOB: [birth year],  MRN: [id number]  73 y.o. male presents at risk foot care. Pt has h/o NIDDM with PAD and painful porokeratotic lesion(s) of both feet and painful mycotic toenails that limit ambulation. Painful toenails interfere with ambulation. Aggravating factors include wearing enclosed shoe gear. Pain is relieved with periodic professional debridement. Painful porokeratotic lesions are aggravated when weightbearing with and without shoegear. Pain is relieved with periodic professional debridement. Patient states the radioactive seed implant was successful as his PSA has decreased. Chief Complaint  Patient presents with   [facility]    He is here for diabetic nail trim, last A1C was 198 per patient, PCP is Dr, Antonetta and last seen 4 months ago.     New problem(s): None   PCP is Antonetta Rollene BRAVO, MD.  Allergies  Allergen Reactions   Metformin And Related Other (See Comments)    Stomach upset   Oxycodone     Makes me foolish, did not help with pain   Rosuvastatin Other (See Comments)    Adverse GI symptoms   Codeine     Did not work and made me feel foolish    Review of Systems: Negative except as noted in the HPI.   Objective:  Robert [name] is a pleasant 73 y.o. male WD, WN in NAD. AAO x 3.  Vascular Examination: Vascular status intact b/l with palpable pedal pulses. CFT immediate b/l. Pedal hair present. No edema. No pain with calf compression b/l. Skin temperature gradient WNL b/l. No varicosities noted. No cyanosis or clubbing noted.  Neurological Examination: Sensation grossly intact b/l with 10 gram monofilament. Vibratory sensation intact b/l.  Dermatological Examination: Pedal skin with normal turgor, texture and tone b/l. No open wounds nor interdigital macerations noted. Toenails 1-5 b/l thick, discolored, elongated with subungual debris and pain on dorsal palpation.   Porokeratotic lesion(s) x 5 plantar  aspect of both feet. No erythema, no edema, no drainage, no fluctuance.  Musculoskeletal Examination: Muscle strength 5/5 to b/l LE.  No pain, crepitus noted b/l. Pes planus deformity noted bilateral LE.Patient ambulates independently without assistive aids.   Radiographs: None  Last A1c:      Latest Ref Rng & Units 03/06/2023    8:30 AM 11/16/2022   10:20 AM 07/20/2022    9:24 AM  Hemoglobin A1C  Hemoglobin-A1c 4.8 - 5.6 % 7.2  7.1  6.7      Assessment:   1. Pain due to onychomycosis of toenails of both feet   2. Porokeratosis   3. Type 2 diabetes mellitus with vascular disease (HCC)    Plan:  -Consent given for treatment as described below: -Examined patient. -Mycotic toenails 1-5 bilaterally were debrided in length and girth with sterile nail nippers and dremel without incident. -Porokeratotic lesion(s) b/l feet pared and enucleated with sterile currette without incident. Total number of lesions debrided=5. -Patient/POA to call should there be question/concern in the interim.  Return in about 3 months (around 08/05/2023).  Robert [name], DPM      [facility] LOCATION: [address], [place] [address]  Office [phone]   [facility] LOCATION: [address] [place], [place] [address] Office [phone]

## 2023-05-27 NOTE — Progress Notes (Incomplete)
 History of Present Illness: 73 yo male presents forPCA conference.  4.9.2024: TRUS/Bx. Prostate volume 43 ml, PSA 4.3, PSAD 0.10. 6/10 cores positive 3 cores (Lt mid lat, Lt apex lat, Lt apex medial) revealed GG 1 cancer in 5/10/5 % of cores, respectively 3 cores (Rt base lat, Rt mid medial, Rt apex lat) revealed GG 2 cancer in 20, 10 and 50% of cores respectively  9.19.2024: He underwent I 125 brachytherapy/SpaceOAR   2.25.2025: Recent PSA 1.4.  He is having no blood in his urine and has not noted blood in the stool.  He is having worsening lower urinary tract symptoms-frequency, urgency, nocturia x 3-4.  Past Medical History:  Diagnosis Date   Allergy    Arteriosclerotic cardiovascular disease (ASCVD)    DES to LAD D1 - 2003 in [place]; failed intervention for a totally obstructed RCA at that time; EF of 45%; 12/2009: Equivocal stress nuclear with good exercise tolerance, negative stress EKG, normal EF with mild mid and distal inferior ischemia   Arthritis    Coronary artery disease    Diabetes mellitus, insulin dependent (IDDM), controlled 10/25/2012   HBA1C is 6.9 on 10/22/2012, [employer] 04/26/2020   Diabetes mellitus, type II (HCC)    GERD (gastroesophageal reflux disease)    Hyperlipidemia    Lipid profile in 09/2011:128, 130, 33, 69   Hypertension    Lab  09/2011: Normal CMet ex G-133   Pneumonia    yrs ago   Prostate cancer (HCC)    Rash    right legt below knee using neosporin 2 to 3 x per day, area healing   Sleep apnea    no CPAP, can't tolerate   Tobacco abuse, in remission    30 pack years; quit in 1985   Wears glasses     Past Surgical History:  Procedure Laterality Date   BIOPSY  01/05/2022   Procedure: BIOPSY;  Surgeon: Dolores Frame, MD;  [facility];  Service: Gastroenterology;;   BIOPSY  02/13/2023   Procedure: BIOPSY;  Surgeon: Marguerita Merles, Reuel Boom, MD;  [facility]: [facility];  Service: Gastroenterology;;   bone spur Left     foot big toe   CARDIAC CATHETERIZATION  2003   DES placed,  done in [place]   COLONOSCOPY  04/03/2008   Negative screening study   COLONOSCOPY N/A 11/11/2014   Procedure: COLONOSCOPY;  Surgeon: Malissa Hippo, MD;  [facility]: [facility];  Service: Endoscopy;  Laterality: N/A;  1030   COLONOSCOPY WITH PROPOFOL N/A 01/05/2022   Procedure: COLONOSCOPY WITH PROPOFOL;  Surgeon: Dolores Frame, MD;  [facility];  Service: Gastroenterology;  Laterality: N/A;  730 ASA 2   EPIDIDYMIS SURGERY  04/03/1994   ESOPHAGOGASTRODUODENOSCOPY (EGD) WITH PROPOFOL N/A 01/05/2022   Procedure: ESOPHAGOGASTRODUODENOSCOPY (EGD) WITH PROPOFOL;  Surgeon: Dolores Frame, MD;  [facility];  Service: Gastroenterology;  Laterality: N/A;   ESOPHAGOGASTRODUODENOSCOPY (EGD) WITH PROPOFOL N/A 02/13/2023   Procedure: ESOPHAGOGASTRODUODENOSCOPY (EGD) WITH PROPOFOL;  Surgeon: Dolores Frame, MD;  [facility];  Service: Gastroenterology;  Laterality: N/A;  9:00AM;ASA 1   HERNIA REPAIR     umbilical as a baby   KNEE ARTHROSCOPY W/ MENISCECTOMY  11/01/2008   Right   POLYPECTOMY  01/05/2022   Procedure: POLYPECTOMY;  Surgeon: Dolores Frame, MD;  [facility];  Service: Gastroenterology;;   PRESSURE ULCER DEBRIDEMENT  04/03/2004   Right lower extremity   RADIOACTIVE SEED IMPLANT N/A 12/21/2022  Procedure: RADIOACTIVE SEED IMPLANT/BRACHYTHERAPY IMPLANT;  Surgeon: Marcine Matar, MD;  Location: [facility];  Service: Urology;  Laterality: N/A;   ROTATOR CUFF REPAIR  07/02/2009   Right   SPACE OAR INSTILLATION N/A 12/21/2022   Procedure: SPACE OAR INSTILLATION;  Surgeon: Marcine Matar, MD;  Location: [facility];  Service: Urology;  Laterality: N/A;   TOTAL KNEE ARTHROPLASTY Right 04/24/2022   Procedure: TOTAL KNEE ARTHROPLASTY;  Surgeon: Ollen Gross, MD;  Location: [facility];  Service:  Orthopedics;  Laterality: Right;    Home Medications:  Allergies as of 05/29/2023       Reactions   Metformin And Related Other (See Comments)   Stomach upset   Oxycodone    "Makes me foolish, did not help with pain"   Rosuvastatin Other (See Comments)   Adverse GI symptoms   Codeine    "Did not work" and "made me feel foolish"        Medication List        Accurate as of May 27, 2023 10:12 AM. If you have any questions, ask your nurse or doctor.          acetaminophen 650 MG CR tablet Commonly known as: TYLENOL Take 650 mg by mouth every 8 (eight) hours as needed for pain.   aspirin EC 81 MG tablet Take 81 mg by mouth daily.   atorvastatin 40 MG tablet Commonly known as: LIPITOR Take 1 tablet (40 mg total) by mouth daily.   famotidine 20 MG tablet Commonly known as: PEPCID Take 1 tablet (20 mg total) by mouth at bedtime.   FreeStyle Sauget 3 Reader Leslie Use as directed.   FreeStyle Libre 3 Sensor Misc Place 1 sensor on the skin every 14 days. Use to check glucose continuously   Insupen Pen Needles 31G X 8 MM Misc Generic drug: Insulin Pen Needle Use as directed once daily   Lantus SoloStar 100 UNIT/ML Solostar Pen Generic drug: insulin glargine Inject 40 Units into the skin at bedtime.   loratadine 10 MG tablet Commonly known as: CLARITIN Take 1 tablet (10 mg total) by mouth daily.   metoprolol succinate 50 MG 24 hr tablet Commonly known as: TOPROL-XL Take 1 tablet (50 mg total) by mouth daily with or immediately following a meal What changed: when to take this   neomycin-bacitracin-polymyxin Oint Commonly known as: NEOSPORIN Apply 1 Application topically 3 (three) times daily. To rash below right knee   nitroGLYCERIN 0.4 MG SL tablet Commonly known as: NITROSTAT Place 1 tablet (0.4 mg total) under the tongue every 5 (five) minutes as needed for chest pain.   olmesartan 20 MG tablet Commonly known as: BENICAR Take 1 tablet (20 mg total)  by mouth daily.   omeprazole 40 MG capsule Commonly known as: PRILOSEC Take 1 capsule (40 mg total) by mouth daily.   sildenafil 100 MG tablet Commonly known as: VIAGRA Take 0.5-1 tablets (50-100 mg total) by mouth as needed.   Trulicity 1.5 MG/0.5ML Soaj Generic drug: Dulaglutide Inject 1.5 mg into the skin once a week.   Vitamin D (Ergocalciferol) 1.25 MG (50000 UNIT) Caps capsule Commonly known as: DRISDOL Take 1 capsule (50,000 Units total) by mouth every 7 (seven) days.        Allergies:  Allergies  Allergen Reactions   Metformin And Related Other (See Comments)    Stomach upset   Oxycodone     "Makes me foolish, did not help with pain"   Rosuvastatin Other (See  Comments)    Adverse GI symptoms   Codeine     "Did not work" and "made me feel foolish"    Family History  Problem Relation Age of Onset   Diabetes Sister    Hypertension Sister    Obesity Sister    Stroke Sister    Arthritis Brother    Diabetes Brother    Cancer Brother    Arthritis Mother    Cancer Father        Hypernephroma   Arthritis Father     Social History:  reports that he quit smoking about 39 years ago. His smoking use included cigarettes. He started smoking about 57 years ago. He has a 30 pack-year smoking history. He has never used smokeless tobacco. He reports that he does not drink alcohol and does not use drugs.  ROS: A complete review of systems was performed.  All systems are negative except for pertinent findings as noted.  Physical Exam:  Vital signs in last 24 hours: There were no vitals taken for this visit. Constitutional:  Alert and oriented, No acute distress Cardiovascular: Regular rate  Respiratory: Normal respiratory effort Neurologic: Grossly intact, no focal deficits Psychiatric: Normal mood and affect  I have reviewed prior pt notes  I have reviewed urinalysis results  I have independently reviewed prior imaging  I have reviewed prior PSA/path   results  IPSS sheet reviewed--19/2    Impression/Assessment:  GG 2 PCA, 5 months out from I-125 brachytherapy/SpaceOAR with excellent PSA response.  LUTS a bit worse  Plan:  I sent in a prescription for alfuzosin  I will see back in about 4 months following PSA

## 2023-05-29 ENCOUNTER — Ambulatory Visit: Payer: PPO | Admitting: Urology

## 2023-05-29 VITALS — BP 176/80 | HR 105

## 2023-05-29 DIAGNOSIS — C61 Malignant neoplasm of prostate: Secondary | ICD-10-CM

## 2023-05-29 DIAGNOSIS — R399 Unspecified symptoms and signs involving the genitourinary system: Secondary | ICD-10-CM

## 2023-05-29 DIAGNOSIS — Z923 Personal history of irradiation: Secondary | ICD-10-CM

## 2023-05-29 DIAGNOSIS — N401 Enlarged prostate with lower urinary tract symptoms: Secondary | ICD-10-CM

## 2023-05-29 LAB — URINALYSIS, ROUTINE W REFLEX MICROSCOPIC
Bilirubin, UA: NEGATIVE
Glucose, UA: NEGATIVE
Ketones, UA: NEGATIVE
Leukocytes,UA: NEGATIVE
Nitrite, UA: NEGATIVE
RBC, UA: NEGATIVE
Specific Gravity, UA: 1.03 (ref 1.005–1.030)
Urobilinogen, Ur: 0.2 mg/dL (ref 0.2–1.0)
pH, UA: 6 (ref 5.0–7.5)

## 2023-05-29 MED ORDER — ALFUZOSIN HCL ER 10 MG PO TB24: 10.0000 mg | ORAL_TABLET | Freq: Every day | ORAL | 11 refills | Status: DC

## 2023-05-30 ENCOUNTER — Other Ambulatory Visit: Payer: Self-pay | Admitting: Family Medicine

## 2023-05-30 DIAGNOSIS — E1169 Type 2 diabetes mellitus with other specified complication: Secondary | ICD-10-CM

## 2023-05-31 ENCOUNTER — Other Ambulatory Visit: Payer: Self-pay

## 2023-05-31 MED ORDER — VITAMIN D (ERGOCALCIFEROL) 1.25 MG (50000 UNIT) PO CAPS
50000.0000 [IU] | ORAL_CAPSULE | ORAL | 1 refills | Status: DC
  Filled 2023-05-31: qty 12, 84d supply, fill #0
  Filled 2023-08-20: qty 12, 84d supply, fill #1

## 2023-05-31 MED ORDER — LANTUS SOLOSTAR 100 UNIT/ML ~~LOC~~ SOPN
40.0000 [IU] | PEN_INJECTOR | Freq: Every day | SUBCUTANEOUS | 3 refills | Status: AC
  Filled 2023-05-31: qty 39, 97d supply, fill #0
  Filled 2023-09-04: qty 39, 97d supply, fill #1
  Filled 2023-12-06: qty 39, 97d supply, fill #2
  Filled 2024-03-20: qty 39, 97d supply, fill #3

## 2023-06-21 ENCOUNTER — Telehealth: Payer: Self-pay

## 2023-06-21 NOTE — Telephone Encounter (Signed)
 Patient's states he was prescribe Alfuzosin  from Dr. Retta Diones and he has notice that he has had blood in his stool since starting the medication. Patient state's he stop taking medication about two day ago but he is still experiencing blood in his stool. Patient is advised to stop medication for now and a message will be sent to Dr. Retta Diones on advisement. Patient verbalized understanding.

## 2023-06-22 NOTE — Telephone Encounter (Signed)
 Patient was made aware and voiced understanding.. That med does not cause rectal bleeding. He can take it but needs to see his pcp or GI doc to check that out

## 2023-06-28 DIAGNOSIS — E119 Type 2 diabetes mellitus without complications: Secondary | ICD-10-CM | POA: Diagnosis not present

## 2023-07-03 ENCOUNTER — Other Ambulatory Visit (HOSPITAL_COMMUNITY): Payer: Self-pay

## 2023-07-17 ENCOUNTER — Other Ambulatory Visit: Payer: Self-pay | Admitting: Family Medicine

## 2023-07-17 ENCOUNTER — Other Ambulatory Visit: Payer: Self-pay | Admitting: Cardiology

## 2023-07-17 ENCOUNTER — Ambulatory Visit: Payer: PPO | Admitting: Family Medicine

## 2023-07-18 ENCOUNTER — Other Ambulatory Visit (HOSPITAL_COMMUNITY): Payer: Self-pay

## 2023-07-18 MED ORDER — INSUPEN PEN NEEDLES 31G X 8 MM MISC
0 refills | Status: DC
  Filled 2023-07-18: qty 100, 100d supply, fill #0

## 2023-07-18 MED ORDER — FREESTYLE LIBRE 3 SENSOR MISC
3 refills | Status: AC
  Filled 2023-07-18: qty 6, 84d supply, fill #0
  Filled 2023-10-07: qty 6, 84d supply, fill #1
  Filled 2024-01-04: qty 6, 84d supply, fill #2
  Filled 2024-04-15: qty 6, 84d supply, fill #3

## 2023-07-18 MED ORDER — METOPROLOL SUCCINATE ER 50 MG PO TB24
50.0000 mg | ORAL_TABLET | Freq: Every day | ORAL | 3 refills | Status: AC
  Filled 2023-07-18: qty 90, 90d supply, fill #0
  Filled 2023-10-12: qty 90, 90d supply, fill #1
  Filled 2024-01-21: qty 90, 90d supply, fill #2
  Filled 2024-04-18: qty 90, 90d supply, fill #3

## 2023-07-19 ENCOUNTER — Other Ambulatory Visit: Payer: Self-pay

## 2023-08-07 DIAGNOSIS — E785 Hyperlipidemia, unspecified: Secondary | ICD-10-CM | POA: Diagnosis not present

## 2023-08-07 DIAGNOSIS — E1169 Type 2 diabetes mellitus with other specified complication: Secondary | ICD-10-CM | POA: Diagnosis not present

## 2023-08-07 DIAGNOSIS — I10 Essential (primary) hypertension: Secondary | ICD-10-CM | POA: Diagnosis not present

## 2023-08-07 DIAGNOSIS — E559 Vitamin D deficiency, unspecified: Secondary | ICD-10-CM | POA: Diagnosis not present

## 2023-08-07 DIAGNOSIS — Z794 Long term (current) use of insulin: Secondary | ICD-10-CM | POA: Diagnosis not present

## 2023-08-07 DIAGNOSIS — R252 Cramp and spasm: Secondary | ICD-10-CM | POA: Diagnosis not present

## 2023-08-08 LAB — CMP14+EGFR
ALT: 22 IU/L (ref 0–44)
AST: 20 IU/L (ref 0–40)
Albumin: 4.2 g/dL (ref 3.8–4.8)
Alkaline Phosphatase: 134 IU/L — ABNORMAL HIGH (ref 44–121)
BUN/Creatinine Ratio: 10 (ref 10–24)
BUN: 13 mg/dL (ref 8–27)
Bilirubin Total: 0.5 mg/dL (ref 0.0–1.2)
CO2: 19 mmol/L — ABNORMAL LOW (ref 20–29)
Calcium: 8.9 mg/dL (ref 8.6–10.2)
Chloride: 104 mmol/L (ref 96–106)
Creatinine, Ser: 1.26 mg/dL (ref 0.76–1.27)
Globulin, Total: 2.7 g/dL (ref 1.5–4.5)
Glucose: 99 mg/dL (ref 70–99)
Potassium: 4.7 mmol/L (ref 3.5–5.2)
Sodium: 140 mmol/L (ref 134–144)
Total Protein: 6.9 g/dL (ref 6.0–8.5)
eGFR: 61 mL/min/{1.73_m2} (ref >59)

## 2023-08-08 LAB — HEMOGLOBIN A1C
Est. average glucose Bld gHb Est-mCnc: 160 mg/dL
Hgb A1c MFr Bld: 7.2 % — ABNORMAL HIGH (ref 4.8–5.6)

## 2023-08-08 LAB — CBC
Hematocrit: 42.9 % (ref 37.5–51.0)
Hemoglobin: 14.1 g/dL (ref 13.0–17.7)
MCH: 30.1 pg (ref 26.6–33.0)
MCHC: 32.9 g/dL (ref 31.5–35.7)
MCV: 92 fL (ref 79–97)
Platelets: 206 10*3/uL (ref 150–450)
RBC: 4.68 x10E6/uL (ref 4.14–5.80)
RDW: 13.7 % (ref 11.6–15.4)
WBC: 4.2 10*3/uL (ref 3.4–10.8)

## 2023-08-08 LAB — TSH: TSH: 0.796 u[IU]/mL (ref 0.450–4.500)

## 2023-08-08 LAB — LIPID PANEL
Chol/HDL Ratio: 3.2 ratio (ref 0.0–5.0)
Cholesterol, Total: 127 mg/dL (ref 100–199)
HDL: 40 mg/dL (ref >39)
LDL Chol Calc (NIH): 65 mg/dL (ref 0–99)
Triglycerides: 122 mg/dL (ref 0–149)
VLDL Cholesterol Cal: 22 mg/dL (ref 5–40)

## 2023-08-08 LAB — VITAMIN D 25 HYDROXY (VIT D DEFICIENCY, FRACTURES): Vit D, 25-Hydroxy: 52 ng/mL (ref 30.0–100.0)

## 2023-08-09 ENCOUNTER — Ambulatory Visit (INDEPENDENT_AMBULATORY_CARE_PROVIDER_SITE_OTHER): Admitting: Family Medicine

## 2023-08-09 ENCOUNTER — Ambulatory Visit: Payer: PPO | Attending: Internal Medicine | Admitting: Internal Medicine

## 2023-08-09 ENCOUNTER — Encounter: Payer: Self-pay | Admitting: Family Medicine

## 2023-08-09 ENCOUNTER — Encounter: Payer: Self-pay | Admitting: Internal Medicine

## 2023-08-09 ENCOUNTER — Ambulatory Visit

## 2023-08-09 VITALS — BP 119/71 | HR 76 | Resp 14 | Ht 69.5 in | Wt 210.0 lb

## 2023-08-09 VITALS — BP 100/66 | HR 94 | Resp 18 | Ht 69.0 in | Wt 209.0 lb

## 2023-08-09 DIAGNOSIS — M79641 Pain in right hand: Secondary | ICD-10-CM

## 2023-08-09 DIAGNOSIS — I2510 Atherosclerotic heart disease of native coronary artery without angina pectoris: Secondary | ICD-10-CM

## 2023-08-09 DIAGNOSIS — C61 Malignant neoplasm of prostate: Secondary | ICD-10-CM | POA: Diagnosis not present

## 2023-08-09 DIAGNOSIS — E1159 Type 2 diabetes mellitus with other circulatory complications: Secondary | ICD-10-CM | POA: Diagnosis not present

## 2023-08-09 DIAGNOSIS — D229 Melanocytic nevi, unspecified: Secondary | ICD-10-CM

## 2023-08-09 DIAGNOSIS — R252 Cramp and spasm: Secondary | ICD-10-CM

## 2023-08-09 DIAGNOSIS — M79642 Pain in left hand: Secondary | ICD-10-CM | POA: Diagnosis not present

## 2023-08-09 DIAGNOSIS — I10 Essential (primary) hypertension: Secondary | ICD-10-CM

## 2023-08-09 DIAGNOSIS — E785 Hyperlipidemia, unspecified: Secondary | ICD-10-CM | POA: Diagnosis not present

## 2023-08-09 DIAGNOSIS — J302 Other seasonal allergic rhinitis: Secondary | ICD-10-CM

## 2023-08-09 NOTE — Assessment & Plan Note (Signed)
 Present for years and increasing in size and irritating, refer surgery wants it removed

## 2023-08-09 NOTE — Patient Instructions (Addendum)
 F/U in 4 months, call if you need me sooner  NON FASTING HBA1C 3 TO 5  DAYS BEFORE NEXT APPT  NURSE PlS SEND FOR RECENT EYE EXAM  NURSE PLS ADD MAGNESIUM LEVEL TO RECENT LABS DX IS MUSCLE CRAMPS   Congrats on excellent  blood work-PLS CUT BACK ON SWEETS AND SUGARY FOODS AND DRINKS AND STARCHY WHITE FOODS, AIM TO GET hba1c UNDER 7  nO MED CHANGES  yOU ARE REFERRED TO GEN SURGERY RE MOLE ON LEFT UPPER ABDOMEN  ENSURE GOOD / ADEQUATE WATER  INTAKE WITH THE LOOSE STOOL, OK TO TAKE OTC IMODIUM FOR SYMPTOM RELIEF ALSO  It is important that you exercise regularly at least 30 minutes 5 times a week. If you develop chest pain, have severe difficulty breathing, or feel very tired, stop exercising immediately and seek medical attention   Thanks for choosing [facility], we consider it a privelige to serve you.

## 2023-08-09 NOTE — Patient Instructions (Signed)
 For osteoarthritis several treatments may be beneficial:  - Topical antiinflammatory medicine such as diclofenac or Voltaren can be applied to  affected area as needed. Topical analgesics containing CBD, menthol , or lidocaine  can be tried.  - Oral nonsteroidal antiinflammatory drugs (NSAIDs) such as ibuprofen, aleve, celebrex, or mobic  are usually helpful for osteoarthritis. These should be taken intermittently or as needed.  - Several supplements for inflammation may help with osteoarthritis, including turmeric, omega-3, chondroitin sulfate, tart cherry, or ginger root extract  - Compressive gloves or sleeve can be helpful to support the joint especially if hurting or swelling with certain activities.  - Physical therapy referral can discuss exercises or activity modification to improve symptoms or strength if needed.  - Local steroid injection is an option if symptoms become worse and not controlled by the above options.

## 2023-08-09 NOTE — Progress Notes (Signed)
 Office Visit Note  Patient: Robert [name]             Date of Birth: 1950/10/01           MRN: [id number]             PCP: Towanda Fret, MD Referring: Towanda Fret, MD Visit Date: 08/09/2023  Subjective:  New Patient (Initial Visit) (Patient states he has pain in both of his hands and sometimes gets cramps in the index and middle fingers of both hands. Patient states he has back pain in his lower back. Patient states he cannot sleep on his right side. )   Discussed the use of AI scribe software for clinical note transcription with the patient, who gave verbal consent to proceed.  History of Present Illness   Robert [name] is a 73 year old male who presents with worsening hand pain and stiffness.  He has been experiencing worsening pain and stiffness in both hands for several years, with the right hand being more affected. The pain is described as shooting and can occur even when not using the hands. He finds it difficult to perform tasks requiring grip strength, such as opening a pack of crackers, due to pain rather than weakness. The pain has been present for two to four years, and he experiences it both at rest and during activity. No swelling is noted, but there is a non-painful nodule on his hand that has been present for years. Morning stiffness lasts about five to ten minutes.  He has a history of osteoarthritis in his right knee, for which he underwent a total knee replacement. He also experiences lower back pain that prevents him from sleeping on his right side, describing it as a 'toothache' that worsens until he changes position. No previous injuries to his hands or wrists are reported.  He uses Voltaren gel on his back daily and on his hands as needed, which provides some relief. He also takes Tylenol  Arthritis, four tablets daily, which helps ease the pain but takes time to be effective.  There is a family history of joint problems, specifically hand pain in  his brother.  He reports a recent episode of vomiting and diarrhea since last night, which he attributes to possibly eating something bad. He has vomited seven times since last night and feels tired and hungry.       Activities of Daily Living:  Patient reports morning stiffness for 5-10 minutes.   Patient Reports nocturnal pain.  Difficulty dressing/grooming: Denies Difficulty climbing stairs: Denies Difficulty getting out of chair: Denies Difficulty using hands for taps, buttons, cutlery, and/or writing: Reports  Review of Systems  Constitutional:  Positive for fatigue.  HENT:  Negative for mouth sores and mouth dryness.   Eyes:  Negative for dryness.  Respiratory:  Positive for shortness of breath.   Cardiovascular:  Negative for chest pain and palpitations.  Gastrointestinal:  Positive for blood in stool and diarrhea. Negative for constipation.  Endocrine: Positive for increased urination.  Genitourinary:  Negative for involuntary urination.  Musculoskeletal:  Positive for joint pain, joint pain, myalgias, morning stiffness and myalgias. Negative for gait problem, joint swelling, muscle weakness and muscle tenderness.  Skin:  Positive for rash. Negative for color change, hair loss and sensitivity to sunlight.  Allergic/Immunologic: Negative for susceptible to infections.  Neurological:  Negative for dizziness and headaches.  Hematological:  Negative for swollen glands.  Psychiatric/Behavioral:  Negative for depressed mood and sleep disturbance.  The patient is not nervous/anxious.     PMFS History:  Patient Active Problem List   Diagnosis Date Noted   Fleshy skin mole 08/09/2023   Xanthoma 02/13/2023   Gastric polyp 01/11/2023   Prostate cancer (HCC) 07/24/2022   Stiffness of right knee 04/26/2022   Primary osteoarthritis of right knee 04/24/2022   Encounter for annual physical exam 02/19/2022   Osteoarthritis of right knee 12/28/2021   OA (osteoarthritis) of knee  11/15/2021   Light headedness 02/12/2021   Asteatotic eczema 12/16/2020   Rash 05/04/2020   Knee pain, right 11/05/2018   Type 2 diabetes mellitus with vascular disease (HCC) 03/05/2018   Dysphagia 03/05/2018   Sleep apnea in adult 04/16/2017   Hypersomnia with sleep apnea 01/16/2016   Seasonal allergies 01/04/2015   Tubular adenoma of colon 11/13/2014   ASCVD (arteriosclerotic cardiovascular disease) 08/21/2014   Bilateral hand pain 04/06/2014   Back pain 09/16/2013   Essential hypertension    Hyperlipidemia LDL goal <70    Overweight (BMI 25.0-29.9) 12/24/2011   Cardiovascular disease 09/27/2009   TOBACCO ABUSE, HX OF 09/27/2009    Past Medical History:  Diagnosis Date   Allergy    Arteriosclerotic cardiovascular disease (ASCVD)    DES to LAD D1 - 2003 in [place] ; failed intervention for a totally obstructed RCA at that time; EF of 45%; 12/2009: Equivocal stress nuclear with good exercise tolerance, negative stress EKG, normal EF with mild mid and distal inferior ischemia   Arthritis    Coronary artery disease    Diabetes mellitus, insulin  dependent (IDDM), controlled 10/25/2012   HBA1C is 6.9 on 10/22/2012, [employer] 04/26/2020   Diabetes mellitus, type II (HCC)    GERD (gastroesophageal reflux disease)    Hyperlipidemia    Lipid profile in 09/2011:128, 130, 33, 69   Hypertension    Lab  09/2011: Normal CMet ex G-133   Pneumonia    yrs ago   Prostate cancer (HCC)    Rash    right legt below knee using neosporin 2 to 3 x per day, area healing   Sleep apnea    no CPAP, can't tolerate   Tobacco abuse, in remission    30 pack years; quit in 1985   Wears glasses     Family History  Problem Relation Age of Onset   Arthritis Mother    Cancer Father        Hypernephroma   Arthritis Father    Diabetes Sister    Hypertension Sister    Obesity Sister    Stroke Sister    Arthritis Brother    Diabetes Brother    Cancer Brother    Past Surgical History:  Procedure  Laterality Date   BIOPSY  01/05/2022   Procedure: BIOPSY;  Surgeon: Urban Garden, MD;  [facility];  Service: Gastroenterology;;   BIOPSY  02/13/2023   Procedure: BIOPSY;  Surgeon: Umberto Ganong, Bearl Limes, MD;  Location: [facility];  Service: Gastroenterology;;   bone spur Left    foot big toe   CARDIAC CATHETERIZATION  2003   DES placed,  done in [place]    COLONOSCOPY  04/03/2008   Negative screening study   COLONOSCOPY N/A 11/11/2014   Procedure: COLONOSCOPY;  Surgeon: Ruby Corporal, MD;  Location: [facility];  Service: Endoscopy;  Laterality: N/A;  1030   COLONOSCOPY WITH PROPOFOL  N/A 01/05/2022   Procedure: COLONOSCOPY WITH PROPOFOL ;  Surgeon: Urban Garden, MD;  Location: [facility];  Service: Gastroenterology;  Laterality: N/A;  730 ASA 2   EPIDIDYMIS SURGERY  04/03/1994   ESOPHAGOGASTRODUODENOSCOPY (EGD) WITH PROPOFOL  N/A 01/05/2022   Procedure: ESOPHAGOGASTRODUODENOSCOPY (EGD) WITH PROPOFOL ;  Surgeon: Urban Garden, MD;  [facility]: [facility];  Service: Gastroenterology;  Laterality: N/A;   ESOPHAGOGASTRODUODENOSCOPY (EGD) WITH PROPOFOL  N/A 02/13/2023   Procedure: ESOPHAGOGASTRODUODENOSCOPY (EGD) WITH PROPOFOL ;  Surgeon: Urban Garden, MD;  [facility]: [facility];  Service: Gastroenterology;  Laterality: N/A;  9:00AM;ASA 1   HERNIA REPAIR     umbilical as a baby   KNEE ARTHROSCOPY W/ MENISCECTOMY  11/01/2008   Right   POLYPECTOMY  01/05/2022   Procedure: POLYPECTOMY;  Surgeon: Urban Garden, MD;  [facility]: [facility];  Service: Gastroenterology;;   PRESSURE ULCER DEBRIDEMENT  04/03/2004   Right lower extremity   RADIOACTIVE SEED IMPLANT N/A 12/21/2022   Procedure: RADIOACTIVE SEED IMPLANT/BRACHYTHERAPY IMPLANT;  Surgeon: Trent Frizzle, MD;  Location: [facility];  Service: Urology;  Laterality: N/A;   ROTATOR CUFF REPAIR  07/02/2009   Right   SPACE OAR INSTILLATION  N/A 12/21/2022   Procedure: SPACE OAR INSTILLATION;  Surgeon: Trent Frizzle, MD;  Location: [facility];  Service: Urology;  Laterality: N/A;   TOTAL KNEE ARTHROPLASTY Right 04/24/2022   Procedure: TOTAL KNEE ARTHROPLASTY;  Surgeon: Liliane Rei, MD;  Location: [facility];  Service: Orthopedics;  Laterality: Right;   Social History   Social History Narrative   Not on file   Immunization History  Administered Date(s) Administered   Fluad Quad(high Dose 65+) 01/01/2019, 12/17/2020, 12/27/2021   Influenza Split 02/14/2011, 12/18/2011   Influenza Whole 12/25/2006, 12/23/2008, 01/13/2010   Influenza,inj,Quad PF,6+ Mos 01/10/2013, 12/02/2013, 01/26/2015, 12/09/2015, 11/30/2016, 12/31/2017, 12/13/2019   Influenza-Unspecified 01/23/2023   Moderna Covid-19 Vaccine Bivalent Booster 96yrs & up 01/28/2021, 07/28/2022, 01/26/2023   Moderna SARS-COV2 Booster Vaccination 01/12/2022   PFIZER(Purple Top)SARS-COV-2 Vaccination 05/01/2019, 05/23/2019, 01/08/2020, 07/12/2020   Pneumococcal Conjugate-13 11/10/2013   Pneumococcal Polysaccharide-23 03/10/2004, 01/13/2010, 11/30/2016   Rsv, Bivalent, Protein Subunit Rsvpref,pf Pattricia Bores) 12/27/2021   Tdap 12/18/2011, 07/28/2022   Zoster Recombinant(Shingrix) 12/17/2019, 08/03/2020   Zoster, Live 12/18/2011     Objective: Vital Signs: BP 119/71 (BP Location: Right Arm, Patient Position: Sitting, Cuff Size: Large)   Pulse 76   Resp 14   Ht 5' 9.5" (1.765 m)   Wt 210 lb (95.3 kg)   BMI 30.57 kg/m    Physical Exam Eyes:     Conjunctiva/sclera: Conjunctivae normal.  Cardiovascular:     Rate and Rhythm: Normal rate and regular rhythm.  Pulmonary:     Effort: Pulmonary effort is normal.     Breath sounds: Normal breath sounds.  Musculoskeletal:     Right lower leg: No edema.     Left lower leg: No edema.  Lymphadenopathy:     Cervical: No cervical adenopathy.  Skin:    General: Skin is warm and dry.     Findings: No rash.   Neurological:     Mental Status: He is alert.  Psychiatric:        Mood and Affect: Mood normal.      Musculoskeletal Exam:  Shoulders full ROM no tenderness or swelling Elbows left worse than right restricted flexion ROM, no tenderness or swelling Wrists full ROM no tenderness or swelling Fingers full ROM some stiffness trying to fully flex, MCP joint widening at 2nd-3rd digits without palpable synovitis, large solid soft tissue nodule on radial side of left 3rd digit along middle phalanx Midline low back  tenderness to pressure at lower lumbar spine, no radiation Hip normal internal and external rotation without pain, no tenderness to lateral hip palpation Knees full ROM right knee postsurgical changes, left knee crepitus no palpable effusion   Investigation: No additional findings.  Imaging: No results found.  Recent Labs: Lab Results  Component Value Date   WBC 4.2 08/07/2023   HGB 14.1 08/07/2023   PLT 206 08/07/2023   NA 140 08/07/2023   K 4.7 08/07/2023   CL 104 08/07/2023   CO2 19 (L) 08/07/2023   GLUCOSE 99 08/07/2023   BUN 13 08/07/2023   CREATININE 1.26 08/07/2023   BILITOT 0.5 08/07/2023   ALKPHOS 134 (H) 08/07/2023   AST 20 08/07/2023   ALT 22 08/07/2023   PROT 6.9 08/07/2023   ALBUMIN 4.2 08/07/2023   CALCIUM  8.9 08/07/2023   GFRAA 64 04/22/2020    Speciality Comments: No specialty comments available.  Procedures:  No procedures performed Allergies: Famotidine , Metformin and related, Oxycodone, Rosuvastatin, and Codeine   Assessment / Plan:     Visit Diagnoses: Bilateral hand pain - Plan: XR Hand 2 View Right, XR Hand 2 View Left, Rheumatoid factor, Cyclic citrul peptide antibody, IgG, Sedimentation rate Osteoarthritis Chronic osteoarthritis in hands with significant pain and functional impairment. Symptoms worsening, differential includes rheumatoid arthritis but findings suggest degenerative joint disease. No imaging since 2015. - Order updated  x-rays of hands- significant MCP joint osteoarthritis but likely from age/occupational history - Consider blood tests to rule out rheumatoid arthritis. - Continue topical Voltaren gel. - Continue Tylenol  Arthritis. - Provided handout information on OA inflammation supplements  Total knee replacement No acute issues with right total knee replacement.  Diarrhea Acute diarrhea likely viral gastroenteritis vs foodborne. Symptoms include nausea and vomiting appearing self limited in duration. - Advise increased fluid intake with electrolytes.        Orders: Orders Placed This Encounter  Procedures   XR Hand 2 View Right   XR Hand 2 View Left   Rheumatoid factor   Cyclic citrul peptide antibody, IgG   Sedimentation rate   No orders of the defined types were placed in this encounter.    Follow-Up Instructions: Return if symptoms worsen or fail to improve.   Matt Song, MD  Note - This record has been created using [employer].  Chart creation errors have been sought, but may not always  have been located. Such creation errors do not reflect on  the standard of medical care.

## 2023-08-10 LAB — RHEUMATOID FACTOR: Rheumatoid fact SerPl-aCnc: <10 [IU]/mL (ref <14)

## 2023-08-10 LAB — SEDIMENTATION RATE: Sed Rate: 25 mm/h — ABNORMAL HIGH (ref 0–20)

## 2023-08-10 LAB — CYCLIC CITRUL PEPTIDE ANTIBODY, IGG: Cyclic Citrullin Peptide Ab: <16 U

## 2023-08-11 LAB — MAGNESIUM: Magnesium: 2 mg/dL (ref 1.6–2.3)

## 2023-08-11 LAB — SPECIMEN STATUS REPORT

## 2023-08-13 ENCOUNTER — Encounter: Payer: Self-pay | Admitting: Family Medicine

## 2023-08-13 ENCOUNTER — Telehealth: Payer: Self-pay | Admitting: Podiatry

## 2023-08-13 NOTE — Telephone Encounter (Signed)
 Called to cancel DSM appt pt would like referral over to [facility]

## 2023-08-21 ENCOUNTER — Other Ambulatory Visit: Payer: Self-pay

## 2023-08-21 ENCOUNTER — Other Ambulatory Visit (HOSPITAL_COMMUNITY): Payer: Self-pay

## 2023-08-22 ENCOUNTER — Encounter: Payer: Self-pay | Admitting: Podiatry

## 2023-08-22 ENCOUNTER — Ambulatory Visit (INDEPENDENT_AMBULATORY_CARE_PROVIDER_SITE_OTHER): Payer: PPO | Admitting: Podiatry

## 2023-08-22 ENCOUNTER — Other Ambulatory Visit: Payer: PPO

## 2023-08-22 DIAGNOSIS — Q828 Other specified congenital malformations of skin: Secondary | ICD-10-CM

## 2023-08-22 DIAGNOSIS — M79674 Pain in right toe(s): Secondary | ICD-10-CM | POA: Diagnosis not present

## 2023-08-22 DIAGNOSIS — E1159 Type 2 diabetes mellitus with other circulatory complications: Secondary | ICD-10-CM | POA: Diagnosis not present

## 2023-08-22 DIAGNOSIS — M79675 Pain in left toe(s): Secondary | ICD-10-CM

## 2023-08-22 DIAGNOSIS — B351 Tinea unguium: Secondary | ICD-10-CM | POA: Diagnosis not present

## 2023-08-24 ENCOUNTER — Encounter: Payer: Self-pay | Admitting: Podiatry

## 2023-08-27 ENCOUNTER — Encounter: Payer: Self-pay | Admitting: Family Medicine

## 2023-08-27 NOTE — Assessment & Plan Note (Signed)
Asymptomatic and stable 

## 2023-08-27 NOTE — Assessment & Plan Note (Signed)
 Diabetes associated with hypertension, hyperlipidemia, obesity, and heart disease Unchanged , not at goal, no med change work on diet in particular  Robert [name] is reminded of the importance of commitment to daily physical activity for 30 minutes or more, as able and the need to limit carbohydrate intake to 30 to 60 grams per meal to help with blood sugar control.   The need to take medication as prescribed, test blood sugar as directed, and to call between visits if there is a concern that blood sugar is uncontrolled is also discussed.   Robert [name] is reminded of the importance of daily foot exam, annual eye examination, and good blood sugar, blood pressure and cholesterol control.     Latest Ref Rng & Units 08/07/2023    8:10 AM 03/08/2023    9:40 AM 03/06/2023    8:30 AM 12/21/2022    8:01 AM 11/16/2022   10:20 AM  Diabetic Labs  HbA1c 4.8 - 5.6 % 7.2   7.2   7.1   Micro/Creat Ratio 0 - 29 mg/g creat  29      Chol 100 - 199 mg/dL 409     811   HDL >91 mg/dL 40     46   Calc LDL 0 - 99 mg/dL 65     65   Triglycerides 0 - 149 mg/dL 478     295   Creatinine 0.76 - 1.27 mg/dL 6.21   3.08  6.57  8.46       08/09/2023    2:46 PM 08/09/2023    8:03 AM 08/09/2023    7:52 AM 05/29/2023   11:25 AM 05/08/2023    8:42 AM 03/08/2023    9:31 AM 03/08/2023    8:48 AM  BP/Weight  Systolic BP 100 119 128 176  128 137  Diastolic BP 66 71 70 80  82 81  Wt. (Lbs) 209  210  210    BMI 30.86 kg/m2  30.57 kg/m2  31.01 kg/m2        Latest Ref Rng & Units 01/24/2023    8:30 AM 06/22/2022   12:00 AM  Foot/eye exam completion dates  Eye Exam No Retinopathy  No Retinopathy      Foot Form Completion  Done      This result is from an external source.

## 2023-08-27 NOTE — Assessment & Plan Note (Signed)
 Controlled, no change in medication DASH diet and commitment to daily physical activity for a minimum of 30 minutes discussed and encouraged, as a part of hypertension management. The importance of attaining a healthy weight is also discussed.     08/09/2023    2:46 PM 08/09/2023    8:03 AM 08/09/2023    7:52 AM 05/29/2023   11:25 AM 05/08/2023    8:42 AM 03/08/2023    9:31 AM 03/08/2023    8:48 AM  BP/Weight  Systolic BP 100 119 128 176  128 137  Diastolic BP 66 71 70 80  82 81  Wt. (Lbs) 209  210  210    BMI 30.86 kg/m2  30.57 kg/m2  31.01 kg/m2

## 2023-08-27 NOTE — Assessment & Plan Note (Signed)
 Controlled, no change in medication

## 2023-08-27 NOTE — Assessment & Plan Note (Signed)
Treated by Oncology.

## 2023-08-27 NOTE — Assessment & Plan Note (Signed)
 Hyperlipidemia:Low fat diet discussed and encouraged.   Lipid Panel  Lab Results  Component Value Date   CHOL 127 08/07/2023   HDL 40 08/07/2023   LDLCALC 65 08/07/2023   TRIG 122 08/07/2023   CHOLHDL 3.2 08/07/2023     Controlled, no change in medication

## 2023-08-27 NOTE — Progress Notes (Signed)
  Subjective:  Patient ID: TRUST LEH, male    DOB: 1950-11-15,  MRN: [id number]  Robert [name] presents to clinic today for at risk foot care. Pt has h/o NIDDM with PAD and painful porokeratotic lesion(s) b/l lower extremities and painful mycotic toenails that limit ambulation. Painful toenails interfere with ambulation. Aggravating factors include wearing enclosed shoe gear. Pain is relieved with periodic professional debridement. Painful porokeratotic lesions are aggravated when weightbearing with and without shoegear. Pain is relieved with periodic professional debridement.  Chief Complaint  Patient presents with   Nail Problem    DFC   New problem(s): None.   PCP is Towanda Fret, MD.LOV 08/09/2023.  Allergies  Allergen Reactions   Famotidine     Metformin And Related Other (See Comments)    Stomach upset   Oxycodone     "Makes me foolish, did not help with pain"   Rosuvastatin Other (See Comments)    Adverse GI symptoms   Codeine     "Did not work" and "made me feel foolish"    Review of Systems: Negative except as noted in the HPI.  Objective: No changes noted in today's physical examination. There were no vitals filed for this visit. KEAWE MARCELLO is a pleasant 73 y.o. male WD, WN in NAD. AAO x 3.  Vascular Examination: Vascular status intact b/l with palpable pedal pulses. CFT immediate b/l. Pedal hair present. No edema. No pain with calf compression b/l. Skin temperature gradient WNL b/l. No varicosities noted. No cyanosis or clubbing noted.  Neurological Examination: Sensation grossly intact b/l with 10 gram monofilament. Vibratory sensation intact b/l.  Dermatological Examination: Pedal skin with normal turgor, texture and tone b/l. No open wounds nor interdigital macerations noted. Toenails 1-5 b/l thick, discolored, elongated with subungual debris and pain on dorsal palpation.   Porokeratotic lesion(s) x 5 plantar aspect of both feet. No erythema,  no edema, no drainage, no fluctuance.  Musculoskeletal Examination: Muscle strength 5/5 to b/l LE.  No pain, crepitus noted b/l. Pes planus deformity noted bilateral LE.Patient ambulates independently without assistive aids.   Radiographs: None  Assessment/Plan: 1. Pain due to onychomycosis of toenails of both feet   2. Porokeratosis   3. Type 2 diabetes mellitus with vascular disease (HCC)   Patient was evaluated and treated. All patient's and/or POA's questions/concerns addressed on today's visit. Toenails 1-5 debrided in length and girth without incident. Porokeratotic lesion(s)  x 5 of both feet pared with sharp debridement without incident. Continue daily foot inspections and monitor blood glucose per PCP/Endocrinologist's recommendations. Continue soft, supportive shoe gear daily. Report any pedal injuries to medical professional. Call office if there are any questions/concerns. -Patient/POA to call should there be question/concern in the interim.   Return in about 3 months (around 11/22/2023).  Luella Sager, DPM      [place] LOCATION: [address], [place] [address]                   Office [phone]   [facility] LOCATION: [address] [place], [place] [address] Office [phone]

## 2023-08-27 NOTE — Progress Notes (Signed)
 Robert [name]     MRN: [id number]      DOB: Apr 28, 1950  Chief Complaint  Patient presents with   Hypertension    4.5 month follow up    Diarrhea    Complains of diarrhea which started last night. Denies fever or vomiting    HPI Robert [name] is here for follow up and re-evaluation of chronic medical conditions, medication management and review of any available recent lab and radiology data.  Preventive health is updated, specifically  Cancer screening and Immunization.   Questions or concerns regarding consultations or procedures which the PT has had in the interim are  addressed. The PT denies any adverse reactions to current medications since the last visit.  Loose stoll x 2 days , no N/V, no other family affecred Denies polyuria, polydipsia, blurred vision , or hypoglycemic episodes. C/o enlarging mole on Left upper abdomen, wants it removed as bothersome, since childhood present  Denies polyuria, polydipsia, blurred vision , or hypoglycemic episodes.    ROS Denies recent fever or chills. Denies sinus pressure, nasal congestion, ear pain or sore throat. Denies chest congestion, productive cough or wheezing. Denies chest pains, palpitations and leg swelling Denies abdominal pain, nausea, vomiting, or constipation.   Denies dysuria, frequency, hesitancy or incontinence. Denies uncontrolled joint pain, swelling and limitation in mobility. Denies headaches, seizures, numbness, or tingling. Denies depression, anxiety or insomnia. Denies skin break down or rash.   PE  BP 100/66   Pulse 94   Resp 18   Ht 5\' 9"  (1.753 m)   Wt 209 lb (94.8 kg)   SpO2 95%   BMI 30.86 kg/m   Patient alert and oriented and in no cardiopulmonary distress.  HEENT: No facial asymmetry, EOMI,     Neck supple .  Chest: Clear to auscultation bilaterally.  CVS: S1, S2 no murmurs, no S3.Regular rate.  ABD: Soft non tender.   Ext: No edema  MS: Adequate though reduced  ROM spine, shoulders,  hips and knees.  Skin: Intact, no ulcerations or rash noted.hyperpigmented mole on LUQ max diameter approx 3.5 cm  Psych: Good eye contact, normal affect. Memory intact not anxious or depressed appearing.  CNS: CN 2-12 intact, power,  normal throughout.no focal deficits noted.   Assessment & Plan Fleshy skin mole Present for years and increasing in size and irritating, refer surgery wants it removed  Essential hypertension Controlled, no change in medication DASH diet and commitment to daily physical activity for a minimum of 30 minutes discussed and encouraged, as a part of hypertension management. The importance of attaining a healthy weight is also discussed.     08/09/2023    2:46 PM 08/09/2023    8:03 AM 08/09/2023    7:52 AM 05/29/2023   11:25 AM 05/08/2023    8:42 AM 03/08/2023    9:31 AM 03/08/2023    8:48 AM  BP/Weight  Systolic BP 100 119 128 176  128 137  Diastolic BP 66 71 70 80  82 81  Wt. (Lbs) 209  210  210    BMI 30.86 kg/m2  30.57 kg/m2  31.01 kg/m2         Hyperlipidemia LDL goal <70 Hyperlipidemia:Low fat diet discussed and encouraged.   Lipid Panel  Lab Results  Component Value Date   CHOL 127 08/07/2023   HDL 40 08/07/2023   LDLCALC 65 08/07/2023   TRIG 122 08/07/2023   CHOLHDL 3.2 08/07/2023     Controlled, no change in medication  Type 2 diabetes mellitus with vascular disease (HCC) Diabetes associated with hypertension, hyperlipidemia, obesity, and heart disease Unchanged , not at goal, no med change work on diet in particular  Robert [name] is reminded of the importance of commitment to daily physical activity for 30 minutes or more, as able and the need to limit carbohydrate intake to 30 to 60 grams per meal to help with blood sugar control.   The need to take medication as prescribed, test blood sugar as directed, and to call between visits if there is a concern that blood sugar is uncontrolled is also discussed.   Robert [name] is reminded of  the importance of daily foot exam, annual eye examination, and good blood sugar, blood pressure and cholesterol control.     Latest Ref Rng & Units 08/07/2023    8:10 AM 03/08/2023    9:40 AM 03/06/2023    8:30 AM 12/21/2022    8:01 AM 11/16/2022   10:20 AM  Diabetic Labs  HbA1c 4.8 - 5.6 % 7.2   7.2   7.1   Micro/Creat Ratio 0 - 29 mg/g creat  29      Chol 100 - 199 mg/dL 161     096   HDL >04 mg/dL 40     46   Calc LDL 0 - 99 mg/dL 65     65   Triglycerides 0 - 149 mg/dL 540     981   Creatinine 0.76 - 1.27 mg/dL 1.91   4.78  2.95  6.21       08/09/2023    2:46 PM 08/09/2023    8:03 AM 08/09/2023    7:52 AM 05/29/2023   11:25 AM 05/08/2023    8:42 AM 03/08/2023    9:31 AM 03/08/2023    8:48 AM  BP/Weight  Systolic BP 100 119 128 176  128 137  Diastolic BP 66 71 70 80  82 81  Wt. (Lbs) 209  210  210    BMI 30.86 kg/m2  30.57 kg/m2  31.01 kg/m2        Latest Ref Rng & Units 01/24/2023    8:30 AM 06/22/2022   12:00 AM  Foot/eye exam completion dates  Eye Exam No Retinopathy  No Retinopathy      Foot Form Completion  Done      This result is from an external source.        Seasonal allergies Controlled, no change in medication   Prostate cancer (HCC) Treated by Oncology  ASCVD (arteriosclerotic cardiovascular disease) Asymptomatic and stable

## 2023-09-05 ENCOUNTER — Other Ambulatory Visit (HOSPITAL_BASED_OUTPATIENT_CLINIC_OR_DEPARTMENT_OTHER): Payer: Self-pay

## 2023-09-06 ENCOUNTER — Encounter: Payer: Self-pay | Admitting: General Surgery

## 2023-09-06 ENCOUNTER — Ambulatory Visit: Admitting: General Surgery

## 2023-09-06 VITALS — BP 127/74 | HR 72 | Temp 98.0°F | Resp 12 | Ht 69.0 in | Wt 209.0 lb

## 2023-09-06 DIAGNOSIS — D1801 Hemangioma of skin and subcutaneous tissue: Secondary | ICD-10-CM

## 2023-09-06 DIAGNOSIS — L821 Other seborrheic keratosis: Secondary | ICD-10-CM

## 2023-09-06 NOTE — Progress Notes (Signed)
 [facility] History and Physical  Reason for Referral:*** Referring Physician: ***  Chief Complaint   New Patient (Initial Visit)     Robert [name] is a 73 y.o. male.  HPI:   Discussed the use of AI scribe software for clinical note transcription with the patient, who gave verbal consent to proceed.  History of Present Illness      ***.  The *** started *** and has had a duration of ***.  It is associated with ***.  The *** is improved with ***, and is made worse with ***.    Quality*** Context***  Past Medical History:  Diagnosis Date  . Allergy   . Arteriosclerotic cardiovascular disease (ASCVD)    DES to LAD D1 - 2003 in [place] ; failed intervention for a totally obstructed RCA at that time; EF of 45%; 12/2009: Equivocal stress nuclear with good exercise tolerance, negative stress EKG, normal EF with mild mid and distal inferior ischemia  . Arthritis   . Coronary artery disease   . Diabetes mellitus, insulin  dependent (IDDM), controlled 10/25/2012   HBA1C is 6.9 on 10/22/2012, [employer]rees[employer] 04/26/2020  . Diabetes mellitus, type II (HCC)   . GERD (gastroesophageal reflux disease)   . Hyperlipidemia    Lipid profile in 09/2011:128, 130, 33, 69  . Hypertension    Lab  09/2011: Normal CMet ex G-133  . Pneumonia    yrs ago  . Prostate cancer (HCC)   . Rash    right legt below knee using neosporin 2 to 3 x per day, area healing  . Sleep apnea    no CPAP, can't tolerate  . Tobacco abuse, in remission    30 pack years; quit in 1985  . Wears glasses     Past Surgical History:  Procedure Laterality Date  . BIOPSY  01/05/2022   Procedure: BIOPSY;  Surgeon: Urban Garden, MD;  [facility];  Service: Gastroenterology;;  . BIOPSY  02/13/2023   Procedure: BIOPSY;  Surgeon: Urban Garden, MD;  [facility];  Service: Gastroenterology;;  . bone spur Left    foot big toe  . CARDIAC CATHETERIZATION  2003   DES  placed,  done in [place]   . COLONOSCOPY  04/03/2008   Negative screening study  . COLONOSCOPY N/A 11/11/2014   Procedure: COLONOSCOPY;  Surgeon: Ruby Corporal, MD;  [facility];  Service: Endoscopy;  Laterality: N/A;  1030  . COLONOSCOPY WITH PROPOFOL  N/A 01/05/2022   Procedure: COLONOSCOPY WITH PROPOFOL ;  Surgeon: Urban Garden, MD;  [facility];  Service: Gastroenterology;  Laterality: N/A;  730 ASA 2  . EPIDIDYMIS SURGERY  04/03/1994  . ESOPHAGOGASTRODUODENOSCOPY (EGD) WITH PROPOFOL  N/A 01/05/2022   Procedure: ESOPHAGOGASTRODUODENOSCOPY (EGD) WITH PROPOFOL ;  Surgeon: Urban Garden, MD;  [facility];  Service: Gastroenterology;  Laterality: N/A;  . ESOPHAGOGASTRODUODENOSCOPY (EGD) WITH PROPOFOL  N/A 02/13/2023   Procedure: ESOPHAGOGASTRODUODENOSCOPY (EGD) WITH PROPOFOL ;  Surgeon: Urban Garden, MD;  [facility];  Service: Gastroenterology;  Laterality: N/A;  9:00AM;ASA 1  . HERNIA REPAIR     umbilical as a baby  . KNEE ARTHROSCOPY W/ MENISCECTOMY  11/01/2008   Right  . POLYPECTOMY  01/05/2022   Procedure: POLYPECTOMY;  Surgeon: Urban Garden, MD;  [facility];  Service: Gastroenterology;;  . PRESSURE ULCER DEBRIDEMENT  04/03/2004   Right lower extremity  . RADIOACTIVE SEED IMPLANT N/A 12/21/2022   Procedure: RADIOACTIVE SEED IMPLANT/BRACHYTHERAPY IMPLANT;  Surgeon: Trent Frizzle, MD;  [facility]: [facility];  Service: Urology;  Laterality: N/A;  . ROTATOR CUFF REPAIR  07/02/2009   Right  . SPACE OAR INSTILLATION N/A 12/21/2022   Procedure: SPACE OAR INSTILLATION;  Surgeon: Trent Frizzle, MD;  Location: [facility];  Service: Urology;  Laterality: N/A;  . TOTAL KNEE ARTHROPLASTY Right 04/24/2022   Procedure: TOTAL KNEE ARTHROPLASTY;  Surgeon: Liliane Rei, MD;  [facility];  Service: Orthopedics;  Laterality: Right;    Family History   Problem Relation Age of Onset  . Arthritis Mother   . Cancer Father        Hypernephroma  . Arthritis Father   . Diabetes Sister   . Hypertension Sister   . Obesity Sister   . Stroke Sister   . Arthritis Brother   . Diabetes Brother   . Cancer Brother     Social History   Tobacco Use  . Smoking status: Former    Current packs/day: 0.00    Average packs/day: 1 pack/day for 30.0 years (30.0 ttl pk-yrs)    Types: Cigarettes    Start date: 04/03/1966    Quit date: 09/02/1983    Years since quitting: 40.0    Passive exposure: Past  . Smokeless tobacco: Never  Vaping Use  . Vaping status: Never Used  Substance Use Topics  . Alcohol use: No    Comment: Former Therapist, nutritional -discontinued use in 2005  . Drug use: No    Medications: {medication reviewed/display:3041432} Allergies as of 09/06/2023       Reactions   Famotidine     Metformin And Related Other (See Comments)   Stomach upset   Oxycodone    "Makes me foolish, did not help with pain"   Rosuvastatin Other (See Comments)   Adverse GI symptoms   Codeine    "Did not work" and "made me feel foolish"        Medication List        Accurate as of September 06, 2023  3:02 PM. If you have any questions, ask your nurse or doctor.          acetaminophen  650 MG CR tablet Commonly known as: TYLENOL  Take 650 mg by mouth every 8 (eight) hours as needed for pain.   alfuzosin  10 MG 24 hr tablet Commonly known as: UROXATRAL  Take 1 tablet (10 mg total) by mouth daily with breakfast.   aspirin  EC 81 MG tablet Take 81 mg by mouth daily.   atorvastatin  40 MG tablet Commonly known as: LIPITOR Take 1 tablet (40 mg total) by mouth daily.   famotidine  20 MG tablet Commonly known as: PEPCID  Take 1 tablet (20 mg total) by mouth at bedtime.   FreeStyle [place] 3 Reader Cook Use as directed.   FreeStyle Libre 3 Sensor Misc Place 1 sensor on the skin every 14 days. Use to check glucose continuously   Insupen Pen Needles 31G X 8 MM  Misc Generic drug: Insulin  Pen Needle Use as directed once daily   Lantus  SoloStar 100 UNIT/ML Solostar Pen Generic drug: insulin  glargine Inject 40 Units into the skin at bedtime.   loratadine  10 MG tablet Commonly known as: CLARITIN  Take 1 tablet (10 mg total) by mouth daily.   metoprolol  succinate 50 MG 24 hr tablet Commonly known as: TOPROL -XL Take 1 tablet (50 mg total) by mouth daily with or immediately following a meal   neomycin-bacitracin-polymyxin Oint Commonly known as: NEOSPORIN Apply 1 Application topically 3 (three)  times daily. To rash below right knee   nitroGLYCERIN  0.4 MG SL tablet Commonly known as: NITROSTAT  Place 1 tablet (0.4 mg total) under the tongue every 5 (five) minutes as needed for chest pain.   olmesartan  20 MG tablet Commonly known as: BENICAR  Take 1 tablet (20 mg total) by mouth daily.   omeprazole  40 MG capsule Commonly known as: PRILOSEC Take 1 capsule (40 mg total) by mouth daily.   sildenafil  100 MG tablet Commonly known as: VIAGRA  Take 0.5-1 tablets (50-100 mg total) by mouth as needed.   Trulicity  1.5 MG/0.5ML Soaj Generic drug: Dulaglutide  Inject 1.5 mg into the skin once a week.   Vitamin D  (Ergocalciferol ) 1.25 MG (50000 UNIT) Caps capsule Commonly known as: DRISDOL  Take 1 capsule (50,000 Units total) by mouth every 7 (seven) days.         ROS:  {Review of Systems:30496}  Blood pressure 127/74, pulse 72, temperature 98 F (36.7 C), temperature source Oral, resp. rate 12, height 5\' 9"  (1.753 m), weight 209 lb (94.8 kg), SpO2 96%. Physical Exam Physical Exam   Results: No results found for this or any previous visit (from the past 48 hours).  No results found.   Assessment and Plan: Assessment and Plan Assessment & Plan      OSBY SWEETIN is a 73 y.o. male with *** -*** -*** -Follow up ***  All questions were answered to the satisfaction of the patient and family***.  The risk and benefits of ***  were discussed including but not limited to ***.  After careful consideration, KRISTOF NADEEM has decided to ***.    Awilda Bogus 09/06/2023, 3:02 PM

## 2023-09-06 NOTE — Patient Instructions (Signed)
 Take tylenol  before your appointment. Call if you have any changes in your medication like addition of a blood thinner.

## 2023-10-01 NOTE — Progress Notes (Signed)
 History of Present Illness: 73 yo male presents forPCA conference.  4.9.2024: TRUS/Bx. Prostate volume 43 ml, PSA 4.3, PSAD 0.10. 6/10 cores positive 3 cores (Lt mid lat, Lt apex lat, Lt apex medial) revealed GG 1 cancer in 5/10/5 % of cores, respectively 3 cores (Rt base lat, Rt mid medial, Rt apex lat) revealed GG 2 cancer in 20, 10 and 50% of cores respectively  9.19.2024: He underwent I 125 brachytherapy/SpaceOAR   2.25.2025: Recent PSA 1.4  7.1.2025: Here today for recheck.  Past Medical History:  Diagnosis Date   Allergy    Arteriosclerotic cardiovascular disease (ASCVD)    DES to LAD D1 - 2003 in [place] ; failed intervention for a totally obstructed RCA at that time; EF of 45%; 12/2009: Equivocal stress nuclear with good exercise tolerance, negative stress EKG, normal EF with mild mid and distal inferior ischemia   Arthritis    Coronary artery disease    Diabetes mellitus, insulin  dependent (IDDM), controlled 10/25/2012   HBA1C is 6.9 on 10/22/2012, [employer]rees[employer] 04/26/2020   Diabetes mellitus, type II (HCC)    GERD (gastroesophageal reflux disease)    Hyperlipidemia    Lipid profile in 09/2011:128, 130, 33, 69   Hypertension    Lab  09/2011: Normal CMet ex G-133   Pneumonia    yrs ago   Prostate cancer (HCC)    Rash    right legt below knee using neosporin 2 to 3 x per day, area healing   Sleep apnea    no CPAP, can't tolerate   Tobacco abuse, in remission    30 pack years; quit in 1985   Wears glasses     Past Surgical History:  Procedure Laterality Date   BIOPSY  01/05/2022   Procedure: BIOPSY;  Surgeon: Eartha Angelia Sieving, MD;  [facility];  Service: Gastroenterology;;   BIOPSY  02/13/2023   Procedure: BIOPSY;  Surgeon: Eartha Angelia, Sieving, MD;  [facility];  Service: Gastroenterology;;   bone spur Left    foot big toe   CARDIAC CATHETERIZATION  2003   DES placed,  done in [place]    COLONOSCOPY  04/03/2008   Negative  screening study   COLONOSCOPY N/A 11/11/2014   Procedure: COLONOSCOPY;  Surgeon: Claudis RAYMOND Rivet, MD;  [facility];  Service: Endoscopy;  Laterality: N/A;  1030   COLONOSCOPY WITH PROPOFOL  N/A 01/05/2022   Procedure: COLONOSCOPY WITH PROPOFOL ;  Surgeon: Eartha Angelia Sieving, MD;  [facility];  Service: Gastroenterology;  Laterality: N/A;  730 ASA 2   EPIDIDYMIS SURGERY  04/03/1994   ESOPHAGOGASTRODUODENOSCOPY (EGD) WITH PROPOFOL  N/A 01/05/2022   Procedure: ESOPHAGOGASTRODUODENOSCOPY (EGD) WITH PROPOFOL ;  Surgeon: Eartha Angelia Sieving, MD;  [facility];  Service: Gastroenterology;  Laterality: N/A;   ESOPHAGOGASTRODUODENOSCOPY (EGD) WITH PROPOFOL  N/A 02/13/2023   Procedure: ESOPHAGOGASTRODUODENOSCOPY (EGD) WITH PROPOFOL ;  Surgeon: Eartha Angelia Sieving, MD;  [facility];  Service: Gastroenterology;  Laterality: N/A;  9:00AM;ASA 1   HERNIA REPAIR     umbilical as a baby   KNEE ARTHROSCOPY W/ MENISCECTOMY  11/01/2008   Right   POLYPECTOMY  01/05/2022   Procedure: POLYPECTOMY;  Surgeon: Eartha Angelia Sieving, MD;  [facility];  Service: Gastroenterology;;   PRESSURE ULCER DEBRIDEMENT  04/03/2004   Right lower extremity   RADIOACTIVE SEED IMPLANT N/A 12/21/2022   Procedure: RADIOACTIVE SEED IMPLANT/BRACHYTHERAPY IMPLANT;  Surgeon: Matilda Senior, MD;  Location: [facility];  Service: Urology;  Laterality: N/A;  ROTATOR CUFF REPAIR  07/02/2009   Right   SPACE OAR INSTILLATION N/A 12/21/2022   Procedure: SPACE OAR INSTILLATION;  Surgeon: Matilda Senior, MD;  Location: [facility];  Service: Urology;  Laterality: N/A;   TOTAL KNEE ARTHROPLASTY Right 04/24/2022   Procedure: TOTAL KNEE ARTHROPLASTY;  Surgeon: Melodi Lerner, MD;  [facility];  Service: Orthopedics;  Laterality: Right;    Home Medications:  Allergies as of 10/02/2023       Reactions   Famotidine     Metformin  And Related Other (See Comments)   Stomach upset   Oxycodone    Makes me foolish, did not help with pain   Rosuvastatin Other (See Comments)   Adverse GI symptoms   Codeine    Did not work and made me feel foolish        Medication List        Accurate as of October 01, 2023 10:35 AM. If you have any questions, ask your nurse or doctor.          acetaminophen  650 MG CR tablet Commonly known as: TYLENOL  Take 650 mg by mouth every 8 (eight) hours as needed for pain.   alfuzosin  10 MG 24 hr tablet Commonly known as: UROXATRAL  Take 1 tablet (10 mg total) by mouth daily with breakfast.   aspirin  EC 81 MG tablet Take 81 mg by mouth daily.   atorvastatin  40 MG tablet Commonly known as: LIPITOR Take 1 tablet (40 mg total) by mouth daily.   famotidine  20 MG tablet Commonly known as: PEPCID  Take 1 tablet (20 mg total) by mouth at bedtime.   FreeStyle [place] 3 Reader [place]ver Use as directed.   FreeStyle Libre 3 Sensor Misc Place 1 sensor on the skin every 14 days. Use to check glucose continuously   Insupen Pen Needles 31G X 8 MM Misc Generic drug: Insulin  Pen Needle Use as directed once daily   Lantus  SoloStar 100 UNIT/ML Solostar Pen Generic drug: insulin  glargine Inject 40 Units into the skin at bedtime.   loratadine  10 MG tablet Commonly known as: CLARITIN  Take 1 tablet (10 mg total) by mouth daily.   metoprolol  succinate 50 MG 24 hr tablet Commonly known as: TOPROL -XL Take 1 tablet (50 mg total) by mouth daily with or immediately following a meal   neomycin-bacitracin-polymyxin Oint Commonly known as: NEOSPORIN Apply 1 Application topically 3 (three) times daily. To rash below right knee   nitroGLYCERIN  0.4 MG SL tablet Commonly known as: NITROSTAT  Place 1 tablet (0.4 mg total) under the tongue every 5 (five) minutes as needed for chest pain.   olmesartan  20 MG tablet Commonly known as: BENICAR  Take 1 tablet (20 mg total) by mouth daily.    omeprazole  40 MG capsule Commonly known as: PRILOSEC Take 1 capsule (40 mg total) by mouth daily.   sildenafil  100 MG tablet Commonly known as: VIAGRA  Take 0.5-1 tablets (50-100 mg total) by mouth as needed.   Trulicity  1.5 MG/0.5ML Soaj Generic drug: Dulaglutide  Inject 1.5 mg into the skin once a week.   Vitamin D  (Ergocalciferol ) 1.25 MG (50000 UNIT) Caps capsule Commonly known as: DRISDOL  Take 1 capsule (50,000 Units total) by mouth every 7 (seven) days.        Allergies:  Allergies  Allergen Reactions   Famotidine     Metformin And Related Other (See Comments)    Stomach upset   Oxycodone     Makes me foolish, did not help with pain   Rosuvastatin Other (  See Comments)    Adverse GI symptoms   Codeine     Did not work and made me feel foolish    Family History  Problem Relation Age of Onset   Arthritis Mother    Cancer Father        Hypernephroma   Arthritis Father    Diabetes Sister    Hypertension Sister    Obesity Sister    Stroke Sister    Arthritis Brother    Diabetes Brother    Cancer Brother     Social History:  reports that he quit smoking about 40 years ago. His smoking use included cigarettes. He started smoking about 57 years ago. He has a 30 pack-year smoking history. He has been exposed to tobacco smoke. He has never used smokeless tobacco. He reports that he does not drink alcohol and does not use drugs.  ROS: A complete review of systems was performed.  All systems are negative except for pertinent findings as noted.  Physical Exam:  Vital signs in last 24 hours: There were no vitals taken for this visit. Constitutional:  Alert and oriented, No acute distress Cardiovascular: Regular rate  Respiratory: Normal respiratory effort Neurologic: Grossly intact, no focal deficits Psychiatric: Normal mood and affect  I have reviewed prior pt notes  I have reviewed urinalysis results  I have independently reviewed prior imaging  I  have reviewed prior PSA/path  results  IPSS sheet reviewed--1    Impression/Assessment:  --GG 2 PCA,   --BPH with lower urinary tract symptoms  Plan:

## 2023-10-02 ENCOUNTER — Ambulatory Visit: Payer: PPO | Admitting: Urology

## 2023-10-02 VITALS — BP 138/72 | HR 81

## 2023-10-02 DIAGNOSIS — N138 Other obstructive and reflux uropathy: Secondary | ICD-10-CM

## 2023-10-02 DIAGNOSIS — Z923 Personal history of irradiation: Secondary | ICD-10-CM | POA: Diagnosis not present

## 2023-10-02 DIAGNOSIS — N401 Enlarged prostate with lower urinary tract symptoms: Secondary | ICD-10-CM | POA: Diagnosis not present

## 2023-10-02 DIAGNOSIS — C61 Malignant neoplasm of prostate: Secondary | ICD-10-CM

## 2023-10-02 LAB — URINALYSIS, ROUTINE W REFLEX MICROSCOPIC
Bilirubin, UA: NEGATIVE
Glucose, UA: NEGATIVE
Ketones, UA: NEGATIVE
Leukocytes,UA: NEGATIVE
Nitrite, UA: NEGATIVE
RBC, UA: NEGATIVE
Specific Gravity, UA: 1.025 (ref 1.005–1.030)
Urobilinogen, Ur: 0.2 mg/dL (ref 0.2–1.0)
pH, UA: 6 (ref 5.0–7.5)

## 2023-10-03 ENCOUNTER — Ambulatory Visit: Payer: Self-pay | Admitting: Urology

## 2023-10-03 LAB — PSA: Prostate Specific Ag, Serum: 0.7 ng/mL (ref 0.0–4.0)

## 2023-10-08 ENCOUNTER — Other Ambulatory Visit: Payer: Self-pay

## 2023-10-08 ENCOUNTER — Other Ambulatory Visit (HOSPITAL_COMMUNITY): Payer: Self-pay

## 2023-10-12 ENCOUNTER — Other Ambulatory Visit (HOSPITAL_COMMUNITY): Payer: Self-pay

## 2023-11-16 ENCOUNTER — Other Ambulatory Visit: Payer: Self-pay | Admitting: Family Medicine

## 2023-11-16 ENCOUNTER — Other Ambulatory Visit (HOSPITAL_COMMUNITY): Payer: Self-pay

## 2023-11-19 ENCOUNTER — Other Ambulatory Visit: Payer: Self-pay

## 2023-11-19 ENCOUNTER — Other Ambulatory Visit (HOSPITAL_COMMUNITY): Payer: Self-pay

## 2023-11-19 MED ORDER — INSUPEN PEN NEEDLES 31G X 8 MM MISC
0 refills | Status: DC
  Filled 2023-11-19: qty 100, 100d supply, fill #0

## 2023-11-28 ENCOUNTER — Ambulatory Visit (INDEPENDENT_AMBULATORY_CARE_PROVIDER_SITE_OTHER): Admitting: Podiatry

## 2023-11-28 ENCOUNTER — Encounter: Payer: Self-pay | Admitting: Podiatry

## 2023-11-28 DIAGNOSIS — M79675 Pain in left toe(s): Secondary | ICD-10-CM

## 2023-11-28 DIAGNOSIS — B351 Tinea unguium: Secondary | ICD-10-CM

## 2023-11-28 DIAGNOSIS — M79674 Pain in right toe(s): Secondary | ICD-10-CM | POA: Diagnosis not present

## 2023-11-28 DIAGNOSIS — M2142 Flat foot [pes planus] (acquired), left foot: Secondary | ICD-10-CM

## 2023-11-28 DIAGNOSIS — M2141 Flat foot [pes planus] (acquired), right foot: Secondary | ICD-10-CM

## 2023-11-28 DIAGNOSIS — Q828 Other specified congenital malformations of skin: Secondary | ICD-10-CM | POA: Diagnosis not present

## 2023-11-28 DIAGNOSIS — E1159 Type 2 diabetes mellitus with other circulatory complications: Secondary | ICD-10-CM | POA: Diagnosis not present

## 2023-11-29 ENCOUNTER — Ambulatory Visit (INDEPENDENT_AMBULATORY_CARE_PROVIDER_SITE_OTHER): Admitting: General Surgery

## 2023-11-29 ENCOUNTER — Encounter: Payer: Self-pay | Admitting: General Surgery

## 2023-11-29 VITALS — BP 112/70 | HR 78 | Temp 98.3°F | Resp 14 | Ht 69.0 in | Wt 208.0 lb

## 2023-11-29 DIAGNOSIS — D1801 Hemangioma of skin and subcutaneous tissue: Secondary | ICD-10-CM

## 2023-11-29 DIAGNOSIS — L821 Other seborrheic keratosis: Secondary | ICD-10-CM | POA: Diagnosis not present

## 2023-11-29 DIAGNOSIS — L820 Inflamed seborrheic keratosis: Secondary | ICD-10-CM | POA: Diagnosis not present

## 2023-11-29 MED ORDER — TRAMADOL HCL 50 MG PO TABS: 50.0000 mg | ORAL_TABLET | Freq: Four times a day (QID) | ORAL | 0 refills | Status: DC | PRN

## 2023-11-29 NOTE — Patient Instructions (Signed)
 Discharge Instructions:  Common Complaints: Pain at the incision site is common.  Some bruising is common.   Diet/ Activity: You can leave the bandage on until 10/01/23. After that you can remove. You can shower before then or take birdbaths.  After you remove the bandage you can replace daily to keep from rubbing on your clothes.  If the dressing becomes bloody and needs to be changed, remove it and change it. If the area is bleeding then place firm pressure on the area with your fingers for 10 minutes (knuckles turning white).  Shower per your regular routine daily.  Do not take hot showers. Take warm showers that are less than 10 minutes. Walk everyday for at least 15-20 minutes. Deep cough and move around every 1-2 hours in the first few days after surgery.  Limit stretching, pulling on your incision if it is located on other parts of your body.  Do not pick at the stitches.  Do not place lotions or balms on your incision unless instructed to specifically by Dr. Kallie.   Medication: Take tylenol  and ibuprofen as needed for pain control, alternating every 4-6 hours.  Example:  Tylenol  1000mg  @ 6am, 12noon, 6pm, (Do not exceed 4000mg  of tylenol  a day). Ibuprofen 800mg  @ 9am, 3pm, 9pm, 3am (Do not exceed 3600mg  of ibuprofen a day).  Take Roxicodone for breakthrough pain every 4 hours.  Take Colace for constipation related to narcotic pain medication. If you do not have a bowel movement in 2 days, take Miralax  over the counter.  Drink plenty of water  to also prevent constipation.   Contact Information: If you have questions or concerns, please call our office, [phone], Monday- Thursday 8AM-5PM and Friday 8AM-12Noon.  If it is after hours or on the weekend, please call [facility], [phone], [phone] and ask to speak to the surgeon on call for Dr. Kallie at [facility].

## 2023-11-29 NOTE — Progress Notes (Unsigned)
 [facility] Procedure Note  11/29/23  Pre-procedure Diagnosis: ***   Post-procedure Diagnosis: Same***   Procedure(s) Performed: ***   Surgeon: Manuelita BROCKS. Kallie, MD   Assistants: ***No qualified resident was available    Anesthesia: Lidocaine  1%**    Specimens: ***   Estimated Blood Loss: Minimal***  Wound Class:***   Procedure Indications: ***  Findings:***   Procedure: The patient was taken to the procedure room and placed ***. The *** was prepared and draped in the usual sterile fashion. Lidocaine  1% was injected***.   ***  Final inspection revealed acceptable hemostasis. The patient tolerated the procedure well.   Future Appointments  Date Time Provider Department Center  12/07/2023  3:00 PM Antonetta Rollene BRAVO, MD [facility] [address]  12/11/2023 11:45 AM Kallie Manuelita BROCKS, MD [facility] None  02/04/2024  4:00 PM Branch, Dorn FALCON, MD [facility] [place] [facility]  02/20/2024 10:40 AM R[facility]-ANNUAL WELLNESS VISIT [facility] [address]  03/12/2024  4:00 PM Gaynel Delon CROME, DPM [facility]  04/08/2024 10:45 AM Matilda Senior, MD [facility] None     Manuelita Kallie, MD [facility] [address] [place], [place] [address] [phone] (office)

## 2023-11-29 NOTE — Progress Notes (Signed)
 Subjective:  Patient ID: Robert [name], male    DOB: [birth year],  MRN: [id number]  Robert [name] presents to clinic today for at risk foot care. Pt has h/o NIDDM with PAD and painful porokeratotic lesion(s) of both feet and painful mycotic toenails that limit ambulation. Painful toenails interfere with ambulation. Aggravating factors include wearing enclosed shoe gear. Pain is relieved with periodic professional debridement. Painful porokeratotic lesions are aggravated when weightbearing with and without shoegear. Pain is relieved with periodic professional debridement. Patient is requesting diabetic shoes on today's visit. Chief Complaint  Patient presents with   Diabetes    DFC IDDM A1C 7.2. Toenail trim. LOV with PCP 08/09/23.   New problem(s): None.   PCP is Antonetta Rollene BRAVO, MD.  Allergies  Allergen Reactions   Famotidine     Metformin And Related Other (See Comments)    Stomach upset   Oxycodone     Makes me foolish, did not help with pain   Rosuvastatin Other (See Comments)    Adverse GI symptoms   Codeine     Did not work and made me feel foolish    Review of Systems: Negative except as noted in the HPI.  Objective: No changes noted in today's physical examination. There were no vitals filed for this visit. Robert [name] is a pleasant 73 y.o. male WD, WN in NAD. AAO x 3.  Vascular Examination: Vascular status intact b/l with palpable pedal pulses. CFT immediate b/l. Pedal hair present. No edema. No pain with calf compression b/l. Skin temperature gradient WNL b/l. No varicosities noted. No cyanosis or clubbing noted.  Neurological Examination: Sensation grossly intact b/l with 10 gram monofilament. Vibratory sensation intact b/l.  Dermatological Examination: Pedal skin with normal turgor, texture and tone b/l. No open wounds nor interdigital macerations noted. Toenails 1-5 b/l thick, discolored, elongated with subungual debris and pain on dorsal palpation.    Porokeratotic lesion(s) x 6 total plantar aspect of both feet. No erythema, no edema, no drainage, no fluctuance.  Musculoskeletal Examination: Muscle strength 5/5 to b/l LE.  No pain, crepitus noted b/l. Pes planus deformity noted bilateral LE. Patient ambulates independently without assistive aids.   Radiographs: None  Assessment/Plan: Orders Placed This Encounter  Procedures   For home use only DME Other see comment    Dispense one pair extra depth shoes and 3 pair heat moldable insoles.    Length of Need:   6 Months    1. Pain due to onychomycosis of toenails of both feet   2. Porokeratosis   3. Pes planus of both feet   4. Type 2 diabetes mellitus with vascular disease [facility])     Consent given for treatment. Patient examined. All patient's and/or POA's questions/concerns addressed on today's visit. Mycotic toenails 1-5 debrided in length and girth without incident. Porokeratotic lesion(s) x 4 left foot, x 1 right foot pared and enucleated with sharp debridement without incident.Continue soft, supportive shoe gear daily. Report any pedal injuries to medical professional. Call office if there are any quesitons/concerns. -Rx to be sent to [facility] for one pair diabetic shoes and 3 pair heat moldable insoles. To [facility] [address] [place], [place] [address] Phone: [phone] Fax: [phone]. -Patient/POA to call should there be question/concern in the interim.   Return in about 3 months (around 02/28/2024).  Delon LITTIE Merlin, DPM      [facility] LOCATION: [address]  [place], [place] [address]                   Office [phone]   [facility] LOCATION: [address] [place], [place] [address] Office [phone]

## 2023-12-04 DIAGNOSIS — E1159 Type 2 diabetes mellitus with other circulatory complications: Secondary | ICD-10-CM | POA: Diagnosis not present

## 2023-12-05 LAB — TISSUE PATH REPORT

## 2023-12-05 LAB — MAGNESIUM: Magnesium: 2 mg/dL (ref 1.6–2.3)

## 2023-12-05 LAB — PATHOLOGY REPORT

## 2023-12-05 LAB — HEMOGLOBIN A1C
Est. average glucose Bld gHb Est-mCnc: 154 mg/dL
Hgb A1c MFr Bld: 7 % — ABNORMAL HIGH (ref 4.8–5.6)

## 2023-12-06 ENCOUNTER — Other Ambulatory Visit (HOSPITAL_COMMUNITY): Payer: Self-pay

## 2023-12-06 ENCOUNTER — Ambulatory Visit: Payer: Self-pay | Admitting: General Surgery

## 2023-12-07 ENCOUNTER — Encounter: Payer: Self-pay | Admitting: Family Medicine

## 2023-12-07 ENCOUNTER — Ambulatory Visit (INDEPENDENT_AMBULATORY_CARE_PROVIDER_SITE_OTHER): Admitting: Family Medicine

## 2023-12-07 VITALS — BP 134/66 | HR 67 | Resp 16 | Ht 69.0 in | Wt 212.0 lb

## 2023-12-07 DIAGNOSIS — Z23 Encounter for immunization: Secondary | ICD-10-CM | POA: Diagnosis not present

## 2023-12-07 DIAGNOSIS — E559 Vitamin D deficiency, unspecified: Secondary | ICD-10-CM

## 2023-12-07 DIAGNOSIS — E785 Hyperlipidemia, unspecified: Secondary | ICD-10-CM

## 2023-12-07 DIAGNOSIS — E1159 Type 2 diabetes mellitus with other circulatory complications: Secondary | ICD-10-CM

## 2023-12-07 DIAGNOSIS — I10 Essential (primary) hypertension: Secondary | ICD-10-CM

## 2023-12-07 MED ORDER — UNABLE TO FIND: 0 refills | Status: DC

## 2023-12-07 MED ORDER — UNABLE TO FIND: 0 refills | Status: AC

## 2023-12-07 NOTE — Patient Instructions (Addendum)
 Annual exam in  4 to 5 months  Flu vaccine today   Covid vaccine script will be given  Micraolb, fasting lipid, cmp and eGFr Dec 6 or after  Pls send for eye exam done   March , 27, 2025  Non fasting hBA1C, cmp and egfr tSH and cBC and vit D 3 to 5 days before 2026 appt

## 2023-12-10 ENCOUNTER — Encounter: Payer: Self-pay | Admitting: Family Medicine

## 2023-12-10 DIAGNOSIS — E559 Vitamin D deficiency, unspecified: Secondary | ICD-10-CM | POA: Insufficient documentation

## 2023-12-10 DIAGNOSIS — Z23 Encounter for immunization: Secondary | ICD-10-CM | POA: Insufficient documentation

## 2023-12-10 NOTE — Assessment & Plan Note (Signed)
 Hyperlipidemia:Low fat diet discussed and encouraged.   Lipid Panel  Lab Results  Component Value Date   CHOL 127 08/07/2023   HDL 40 08/07/2023   LDLCALC 65 08/07/2023   TRIG 122 08/07/2023   CHOLHDL 3.2 08/07/2023     Controlled, no change in medication

## 2023-12-10 NOTE — Assessment & Plan Note (Signed)
 Controlled, no change in medication DASH diet and commitment to daily physical activity for a minimum of 30 minutes discussed and encouraged, as a part of hypertension management. The importance of attaining a healthy weight is also discussed.     12/07/2023    3:01 PM 11/29/2023    3:05 PM 10/02/2023    9:23 AM 09/06/2023    2:35 PM 08/09/2023    2:46 PM 08/09/2023    8:03 AM 08/09/2023    7:52 AM  BP/Weight  Systolic BP 134 112 138 127 100 119 128  Diastolic BP 66 70 72 74 66 71 70  Wt. (Lbs) 212.04 208  209 209  210  BMI 31.31 kg/m2 30.72 kg/m2  30.86 kg/m2 30.86 kg/m2  30.57 kg/m2

## 2023-12-10 NOTE — Assessment & Plan Note (Signed)
 Updated lab needed at/ before next visit.

## 2023-12-10 NOTE — Progress Notes (Signed)
 CORT DRAGOO     MRN: [id number]      DOB: 1950-04-28  Chief Complaint  Patient presents with   Medical Management of Chronic Issues    4 month follow up     HPI Robert [name] is here for follow up and re-evaluation of chronic medical conditions, medication management and review of any available recent lab and radiology data.  Preventive health is updated, specifically  Cancer screening and Immunization.   Questions or concerns regarding consultations or procedures which the PT has had in the interim are  addressed. The PT denies any adverse reactions to current medications since the last visit.  There are no new concerns.  There are no specific complaints   ROS Denies recent fever or chills. Denies sinus pressure, nasal congestion, ear pain or sore throat. Denies chest congestion, productive cough or wheezing. Denies chest pains, palpitations and leg swelling Denies abdominal pain, nausea, vomiting,diarrhea or constipation.   Denies dysuria, frequency, hesitancy or incontinence. Denies joint pain, swelling and limitation in mobility. Denies headaches, seizures, numbness, or tingling. Denies depression, anxiety or insomnia. Denies skin break down or rash.   PE  BP 134/66   Pulse 67   Resp 16   Ht 5' 9 (1.753 m)   Wt 212 lb 0.6 oz (96.2 kg)   SpO2 94%   BMI 31.31 kg/m   Patient alert and oriented and in no cardiopulmonary distress.  HEENT: No facial asymmetry, EOMI,     Neck supple .  Chest: Clear to auscultation bilaterally.  CVS: S1, S2 no murmurs, no S3.Regular rate.  ABD: Soft non tender.   Ext: No edema  MS: Adequate ROM spine, shoulders, hips and knees.  Skin: Intact, no ulcerations or rash noted.  Psych: Good eye contact, normal affect. Memory intact not anxious or depressed appearing.  CNS: CN 2-12 intact, power,  normal throughout.no focal deficits noted.   Assessment & Plan  Essential hypertension Controlled, no change in medication DASH  diet and commitment to daily physical activity for a minimum of 30 minutes discussed and encouraged, as a part of hypertension management. The importance of attaining a healthy weight is also discussed.     12/07/2023    3:01 PM 11/29/2023    3:05 PM 10/02/2023    9:23 AM 09/06/2023    2:35 PM 08/09/2023    2:46 PM 08/09/2023    8:03 AM 08/09/2023    7:52 AM  BP/Weight  Systolic BP 134 112 138 127 100 119 128  Diastolic BP 66 70 72 74 66 71 70  Wt. (Lbs) 212.04 208  209 209  210  BMI 31.31 kg/m2 30.72 kg/m2  30.86 kg/m2 30.86 kg/m2  30.57 kg/m2       Hyperlipidemia LDL goal <70 Hyperlipidemia:Low fat diet discussed and encouraged.   Lipid Panel  Lab Results  Component Value Date   CHOL 127 08/07/2023   HDL 40 08/07/2023   LDLCALC 65 08/07/2023   TRIG 122 08/07/2023   CHOLHDL 3.2 08/07/2023     Controlled, no change in medication   Type 2 diabetes mellitus with vascular disease (HCC) Diabetes associated with hypertension, hyperlipidemia, arthritis, and heart disease  Robert [name] is reminded of the importance of commitment to daily physical activity for 30 minutes or more, as able and the need to limit carbohydrate intake to 30 to 60 grams per meal to help with blood sugar control.   The need to take medication as prescribed, test blood sugar as  directed, and to call between visits if there is a concern that blood sugar is uncontrolled is also discussed.   Robert [name] is reminded of the importance of daily foot exam, annual eye examination, and good blood sugar, blood pressure and cholesterol control.     Latest Ref Rng & Units 12/04/2023    8:01 AM 08/07/2023    8:10 AM 03/08/2023    9:40 AM 03/06/2023    8:30 AM 12/21/2022    8:01 AM  Diabetic Labs  HbA1c 4.8 - 5.6 % 7.0  7.2   7.2    Micro/Creat Ratio 0 - 29 mg/g creat   29     Chol 100 - 199 mg/dL  872      HDL >60 mg/dL  40      Calc LDL 0 - 99 mg/dL  65      Triglycerides 0 - 149 mg/dL  877      Creatinine 9.23 - 1.27  mg/dL  8.73   8.72  8.59       12/07/2023    3:01 PM 11/29/2023    3:05 PM 10/02/2023    9:23 AM 09/06/2023    2:35 PM 08/09/2023    2:46 PM 08/09/2023    8:03 AM 08/09/2023    7:52 AM  BP/Weight  Systolic BP 134 112 138 127 100 119 128  Diastolic BP 66 70 72 74 66 71 70  Wt. (Lbs) 212.04 208  209 209  210  BMI 31.31 kg/m2 30.72 kg/m2  30.86 kg/m2 30.86 kg/m2  30.57 kg/m2      Latest Ref Rng & Units 01/24/2023    8:30 AM 06/22/2022   12:00 AM  Foot/eye exam completion dates  Eye Exam No Retinopathy  No Retinopathy      Foot Form Completion  Done      This result is from an external source.        Immunization due After obtaining informed consent, the influenza  vaccine is  administered , with no adverse effect noted at the time of administration.   Vitamin D  deficiency Updated lab needed at/ before next visit.

## 2023-12-10 NOTE — Assessment & Plan Note (Signed)
 After obtaining informed consent, the influenza vaccine is  administered , with no adverse effect noted at the time of administration.

## 2023-12-10 NOTE — Assessment & Plan Note (Signed)
 Diabetes associated with hypertension, hyperlipidemia, arthritis, and heart disease  Robert [name] is reminded of the importance of commitment to daily physical activity for 30 minutes or more, as able and the need to limit carbohydrate intake to 30 to 60 grams per meal to help with blood sugar control.   The need to take medication as prescribed, test blood sugar as directed, and to call between visits if there is a concern that blood sugar is uncontrolled is also discussed.   Robert [name] is reminded of the importance of daily foot exam, annual eye examination, and good blood sugar, blood pressure and cholesterol control.     Latest Ref Rng & Units 12/04/2023    8:01 AM 08/07/2023    8:10 AM 03/08/2023    9:40 AM 03/06/2023    8:30 AM 12/21/2022    8:01 AM  Diabetic Labs  HbA1c 4.8 - 5.6 % 7.0  7.2   7.2    Micro/Creat Ratio 0 - 29 mg/g creat   29     Chol 100 - 199 mg/dL  872      HDL >60 mg/dL  40      Calc LDL 0 - 99 mg/dL  65      Triglycerides 0 - 149 mg/dL  877      Creatinine 9.23 - 1.27 mg/dL  8.73   8.72  8.59       12/07/2023    3:01 PM 11/29/2023    3:05 PM 10/02/2023    9:23 AM 09/06/2023    2:35 PM 08/09/2023    2:46 PM 08/09/2023    8:03 AM 08/09/2023    7:52 AM  BP/Weight  Systolic BP 134 112 138 127 100 119 128  Diastolic BP 66 70 72 74 66 71 70  Wt. (Lbs) 212.04 208  209 209  210  BMI 31.31 kg/m2 30.72 kg/m2  30.86 kg/m2 30.86 kg/m2  30.57 kg/m2      Latest Ref Rng & Units 01/24/2023    8:30 AM 06/22/2022   12:00 AM  Foot/eye exam completion dates  Eye Exam No Retinopathy  No Retinopathy      Foot Form Completion  Done      This result is from an external source.

## 2023-12-11 ENCOUNTER — Encounter: Payer: Self-pay | Admitting: General Surgery

## 2023-12-11 ENCOUNTER — Ambulatory Visit (INDEPENDENT_AMBULATORY_CARE_PROVIDER_SITE_OTHER): Admitting: General Surgery

## 2023-12-11 VITALS — BP 119/73 | HR 64 | Temp 98.0°F | Resp 14 | Ht 69.0 in | Wt 210.0 lb

## 2023-12-11 DIAGNOSIS — L821 Other seborrheic keratosis: Secondary | ICD-10-CM

## 2023-12-11 DIAGNOSIS — D1801 Hemangioma of skin and subcutaneous tissue: Secondary | ICD-10-CM

## 2023-12-11 NOTE — Patient Instructions (Signed)
Steristrips will peel off in the next 5-7 days. You can remove them once they are peeling off. It is ok to shower. Pat the area dry.   Call with issues.

## 2023-12-11 NOTE — Progress Notes (Signed)
 [facility]  No major issues.  Pathology all benign.   BP 119/73   Pulse 64   Temp 98 F (36.7 C) (Oral)   Resp 14   Ht 5' 9 (1.753 m)   Wt 210 lb (95.3 kg)   SpO2 92%   BMI 31.01 kg/m  Sutures removed, steri strips placed, minor erythema but nothing extending out  Patient s/p excision of hemangioma and seborrhoic keratosis. Improving.   Steristrips will peel off in the next 5-7 days. You can remove them once they are peeling off. It is ok to shower. Pat the area dry.  Call with issues.   Manuelita Pander, MD [facility] [address] [address], [place] [address] [phone] (office)

## 2023-12-12 ENCOUNTER — Ambulatory Visit: Admitting: Family Medicine

## 2024-01-04 ENCOUNTER — Other Ambulatory Visit (HOSPITAL_COMMUNITY): Payer: Self-pay

## 2024-01-16 ENCOUNTER — Encounter (INDEPENDENT_AMBULATORY_CARE_PROVIDER_SITE_OTHER): Payer: Self-pay | Admitting: Gastroenterology

## 2024-01-22 ENCOUNTER — Other Ambulatory Visit (HOSPITAL_COMMUNITY): Payer: Self-pay

## 2024-02-04 ENCOUNTER — Ambulatory Visit: Attending: Cardiology | Admitting: Cardiology

## 2024-02-04 ENCOUNTER — Encounter: Payer: Self-pay | Admitting: Cardiology

## 2024-02-04 VITALS — BP 140/75 | HR 84 | Ht 69.0 in | Wt 204.0 lb

## 2024-02-04 DIAGNOSIS — I10 Essential (primary) hypertension: Secondary | ICD-10-CM

## 2024-02-04 DIAGNOSIS — I2510 Atherosclerotic heart disease of native coronary artery without angina pectoris: Secondary | ICD-10-CM | POA: Diagnosis not present

## 2024-02-04 DIAGNOSIS — E782 Mixed hyperlipidemia: Secondary | ICD-10-CM

## 2024-02-04 NOTE — Patient Instructions (Signed)

## 2024-02-04 NOTE — Progress Notes (Signed)
 Clinical Summary Robert [name] is a 73 y.o.male seen today for follow up of the following medical problems.     1. CAD - history of prior stenting. DES to LAD D1 in [place] in 2003. Failed intervention to RCA CTO at that time.  -08/2014 exercise nuclear stress with Duke treadmill score of 7 consistent with low risk, small area of inferolateral ischemia. Mild inferior ischemia has been noted previously in 12/2009 stress, from notes he has history of RCA CTO that was not able to be opened during 2003 cath in [place] .     - some recent chest pains. Mid to left sided, dull pain 3/10 in severity. Can occur at rest or with activity. No other assocaited symptoms. Lasts a few seconds. Occurs once every 3-4 weeks - no exertional symptoms. No specific fatigue.  - compliant with  meds     2. HTN - he is compliant with meds   3. Hyperlipidemia   - 08/2020 TC 120 TG 99 HDL 46 LDL 55 - 11/2021 TC 871 TG 86 HDL 46 LDL 65 - 11/2022 TC 869 TG 898 HDL 46 LDL 65 - 08/2023 TC 872 TG 877 HDL 40 LDL 65   4. DM II - followed by Dr Antonetta   5. OSA -  no longer using CPAP - did not qualify for inspire.    6. AAA screen - no aneurysm by US  08/2010   7. Right knee replacement Jan 2024   8. Prostate cancer - recent seed implant.  Past Medical History:  Diagnosis Date   Allergy    Arteriosclerotic cardiovascular disease (ASCVD)    DES to LAD D1 - 2003 in [place] ; failed intervention for a totally obstructed RCA at that time; EF of 45%; 12/2009: Equivocal stress nuclear with good exercise tolerance, negative stress EKG, normal EF with mild mid and distal inferior ischemia   Arthritis    Coronary artery disease    Diabetes mellitus, insulin  dependent (IDDM), controlled 10/25/2012   HBA1C is 6.9 on 10/22/2012, [employer] 04/26/2020   Diabetes mellitus, type II (HCC)    GERD (gastroesophageal reflux disease)    Hyperlipidemia    Lipid profile in 09/2011:128, 130, 33, 69   Hypertension    Lab  09/2011:  Normal CMet ex G-133   Pneumonia    yrs ago   Prostate cancer (HCC)    Rash    right legt below knee using neosporin 2 to 3 x per day, area healing   Sleep apnea    no CPAP, can't tolerate   Tobacco abuse, in remission    30 pack years; quit in 1985   Wears glasses      Allergies  Allergen Reactions   Famotidine     Metformin And Related Other (See Comments)    Stomach upset   Oxycodone     Makes me foolish, did not help with pain   Rosuvastatin Other (See Comments)    Adverse GI symptoms   Codeine     Did not work and made me feel foolish     Current Outpatient Medications  Medication Sig Dispense Refill   acetaminophen  (TYLENOL ) 650 MG CR tablet Take 650 mg by mouth every 8 (eight) hours as needed for pain.     aspirin  EC 81 MG tablet Take 81 mg by mouth daily.     atorvastatin  (LIPITOR) 40 MG tablet Take 1 tablet (40 mg total) by mouth daily. 90 tablet 3   Continuous Glucose Receiver (  FREESTYLE LIBRE 3 READER) DEVI Use as directed. 1 each 0   Continuous Glucose Sensor (FREESTYLE LIBRE 3 SENSOR) MISC Place 1 sensor on the skin every 14 days. Use to check glucose continuously 6 each 3   Dulaglutide  (TRULICITY ) 1.5 MG/0.5ML SOAJ Inject 1.5 mg into the skin once a week. 6 mL 3   insulin  glargine (LANTUS  SOLOSTAR) 100 UNIT/ML Solostar Pen Inject 40 Units into the skin at bedtime. 45 mL 3   Insulin  Pen Needle (INSUPEN PEN NEEDLES) 31G X 8 MM MISC Use as directed once daily 100 each 0   loratadine  (CLARITIN ) 10 MG tablet Take 1 tablet (10 mg total) by mouth daily. 30 tablet 1   metoprolol  succinate (TOPROL -XL) 50 MG 24 hr tablet Take 1 tablet (50 mg total) by mouth daily with or immediately following a meal 90 tablet 3   neomycin-bacitracin-polymyxin (NEOSPORIN) OINT Apply 1 Application topically 3 (three) times daily. To rash below right knee     nitroGLYCERIN  (NITROSTAT ) 0.4 MG SL tablet Place 1 tablet (0.4 mg total) under the tongue every 5 (five) minutes as needed for  chest pain. 25 tablet 3   olmesartan  (BENICAR ) 20 MG tablet Take 1 tablet (20 mg total) by mouth daily. 90 tablet 3   sildenafil  (VIAGRA ) 100 MG tablet Take 0.5-1 tablets (50-100 mg total) by mouth as needed. 30 tablet 11   UNABLE TO FIND Med Name: Covid Vaccine 2025 1 each 0   Vitamin D , Ergocalciferol , (DRISDOL ) 1.25 MG (50000 UNIT) CAPS capsule Take 1 capsule (50,000 Units total) by mouth every 7 (seven) days. 12 capsule 1   No current facility-administered medications for this visit.     Past Surgical History:  Procedure Laterality Date   BIOPSY  01/05/2022   Procedure: BIOPSY;  Surgeon: Eartha Angelia Sieving, MD;  [facility];  Service: Gastroenterology;;   BIOPSY  02/13/2023   Procedure: BIOPSY;  Surgeon: Eartha Angelia, Sieving, MD;  [facility]: [facility];  Service: Gastroenterology;;   bone spur Left    foot big toe   CARDIAC CATHETERIZATION  2003   DES placed,  done in [place]    COLONOSCOPY  04/03/2008   Negative screening study   COLONOSCOPY N/A 11/11/2014   Procedure: COLONOSCOPY;  Surgeon: Claudis RAYMOND Rivet, MD;  Location: [facility];  Service: Endoscopy;  Laterality: N/A;  1030   COLONOSCOPY WITH PROPOFOL  N/A 01/05/2022   Procedure: COLONOSCOPY WITH PROPOFOL ;  Surgeon: Eartha Angelia Sieving, MD;  [facility];  Service: Gastroenterology;  Laterality: N/A;  730 ASA 2   EPIDIDYMIS SURGERY  04/03/1994   ESOPHAGOGASTRODUODENOSCOPY (EGD) WITH PROPOFOL  N/A 01/05/2022   Procedure: ESOPHAGOGASTRODUODENOSCOPY (EGD) WITH PROPOFOL ;  Surgeon: Eartha Angelia Sieving, MD;  [facility];  Service: Gastroenterology;  Laterality: N/A;   ESOPHAGOGASTRODUODENOSCOPY (EGD) WITH PROPOFOL  N/A 02/13/2023   Procedure: ESOPHAGOGASTRODUODENOSCOPY (EGD) WITH PROPOFOL ;  Surgeon: Eartha Angelia Sieving, MD;  [facility];  Service: Gastroenterology;  Laterality: N/A;  9:00AM;ASA 1   HERNIA REPAIR     umbilical as a baby   KNEE  ARTHROSCOPY W/ MENISCECTOMY  11/01/2008   Right   POLYPECTOMY  01/05/2022   Procedure: POLYPECTOMY;  Surgeon: Eartha Angelia Sieving, MD;  [facility];  Service: Gastroenterology;;   PRESSURE ULCER DEBRIDEMENT  04/03/2004   Right lower extremity   RADIOACTIVE SEED IMPLANT N/A 12/21/2022   Procedure: RADIOACTIVE SEED IMPLANT/BRACHYTHERAPY IMPLANT;  Surgeon: Matilda Senior, MD;  Location: [facility];  Service: Urology;  Laterality:  N/A;   ROTATOR CUFF REPAIR  07/02/2009   Right   SPACE OAR INSTILLATION N/A 12/21/2022   Procedure: SPACE OAR INSTILLATION;  Surgeon: Matilda Senior, MD;  Location: [facility];  Service: Urology;  Laterality: N/A;   TOTAL KNEE ARTHROPLASTY Right 04/24/2022   Procedure: TOTAL KNEE ARTHROPLASTY;  Surgeon: Melodi Lerner, MD;  Location: [facility];  Service: Orthopedics;  Laterality: Right;     Allergies  Allergen Reactions   Famotidine     Metformin And Related Other (See Comments)    Stomach upset   Oxycodone     Makes me foolish, did not help with pain   Rosuvastatin Other (See Comments)    Adverse GI symptoms   Codeine     Did not work and made me feel foolish      Family History  Problem Relation Age of Onset   Arthritis Mother    Cancer Father        Hypernephroma   Arthritis Father    Diabetes Sister    Hypertension Sister    Obesity Sister    Stroke Sister    Arthritis Brother    Diabetes Brother    Cancer Brother      Social History Mr. Tozer reports that he quit smoking about 40 years ago. His smoking use included cigarettes. He started smoking about 57 years ago. He has a 30 pack-year smoking history. He has been exposed to tobacco smoke. He has never used smokeless tobacco. Mr. Maue reports no history of alcohol use.    Physical Examination Vitals:   02/04/24 1559 02/04/24 1647  BP: (!) 146/74 (!) 140/75  Pulse: 84   SpO2: 96%    Filed Weights   02/04/24 1559   Weight: 204 lb (92.5 kg)    Gen: resting comfortably, no acute distress HEENT: no scleral icterus, pupils equal round and reactive, no palptable cervical adenopathy,  CV: RRR, no m/rg, no jvd Resp: Clear to auscultation bilaterally GI: abdomen is soft, non-tender, non-distended, normal bowel sounds, no hepatosplenomegaly MSK: extremities are warm, no edema.  Skin: warm, no rash Neuro:  no focal deficits Psych: appropriate affect   Diagnostic Studies  08/2014 Exercise Nuclear stress Low risk Duke treadmill score of 7. Blood pressure demonstrated a hypertensive response to exercise. There was no ST segment deviation noted during stress. There is a small defect of mild severity present in the basal inferolateral, mid inferolateral and apical inferior location. The defect is reversible. Consistent with mild, predominantly mid to basal inferolateral ischemia. Myocardial perfusion is abnormal. This is a low risk study.   Assessment and Plan   1. CAD - recent chest pains noncardiac in description, monitor at this time - EKG shows SR, no ischemic changes - continue current meds   2. HTN - mildly elevated but has been well controlled multiple other provider visits over the last month, monitor at this time.      3. Hyperlipidemia -he is at goal, continue current meds     Dorn PHEBE Ross, M.D.

## 2024-02-07 ENCOUNTER — Ambulatory Visit: Admitting: Cardiology

## 2024-02-18 ENCOUNTER — Other Ambulatory Visit: Payer: Self-pay | Admitting: Family Medicine

## 2024-02-18 DIAGNOSIS — E1169 Type 2 diabetes mellitus with other specified complication: Secondary | ICD-10-CM

## 2024-02-19 ENCOUNTER — Other Ambulatory Visit: Payer: Self-pay

## 2024-02-19 ENCOUNTER — Other Ambulatory Visit (HOSPITAL_COMMUNITY): Payer: Self-pay

## 2024-02-19 MED ORDER — INSUPEN PEN NEEDLES 31G X 8 MM MISC
0 refills | Status: AC
  Filled 2024-02-19: qty 100, 100d supply, fill #0

## 2024-02-19 MED ORDER — TRULICITY 1.5 MG/0.5ML ~~LOC~~ SOAJ
1.5000 mg | SUBCUTANEOUS | 3 refills | Status: DC
  Filled 2024-02-19: qty 6, 84d supply, fill #0

## 2024-02-19 MED ORDER — VITAMIN D (ERGOCALCIFEROL) 1.25 MG (50000 UNIT) PO CAPS
50000.0000 [IU] | ORAL_CAPSULE | ORAL | 1 refills | Status: AC
  Filled 2024-02-19: qty 12, 84d supply, fill #0

## 2024-02-20 ENCOUNTER — Ambulatory Visit: Payer: PPO

## 2024-02-20 VITALS — BP 125/64 | Ht 69.0 in | Wt 204.0 lb

## 2024-02-20 DIAGNOSIS — Z0000 Encounter for general adult medical examination without abnormal findings: Secondary | ICD-10-CM | POA: Diagnosis not present

## 2024-02-20 NOTE — Patient Instructions (Signed)
 Robert [name],  Thank you for taking the time for your Medicare Wellness Visit. I appreciate your continued commitment to your health goals. Please review the care plan we discussed, and feel free to reach out if I can assist you further.  Please note that Annual Wellness Visits do not include a physical exam. Some assessments may be limited, especially if the visit was conducted virtually. If needed, we may recommend an in-person follow-up with your provider.  Ongoing Care Seeing your primary care provider every 3 to 6 months helps us  monitor your health and provide consistent, personalized care.   Medicare Wellness Visit in 1 year: February 24, 2025 at 3:50 pm in person  Referrals If a referral was made during today's visit and you haven't received any updates within two weeks, please contact the referred provider directly to check on the status.  N/a today Recommended Screenings:  Health Maintenance  Topic Date Due   COVID-19 Vaccine (8 - 2025-26 season) 12/03/2023   Medicare Annual Wellness Visit  02/14/2024   Yearly kidney health urinalysis for diabetes  03/07/2024   Hemoglobin A1C  06/02/2024   Eye exam for diabetics  06/27/2024   Yearly kidney function blood test for diabetes  08/06/2024   Complete foot exam   12/09/2024   Colon Cancer Screening  01/05/2029   DTaP/Tdap/Td vaccine (3 - Td or Tdap) 07/27/2032   Pneumococcal Vaccine for age over 34  Completed   Flu Shot  Completed   Hepatitis C Screening  Completed   Zoster (Shingles) Vaccine  Completed   Meningitis B Vaccine  Aged Out       02/16/2024    9:53 AM  Advanced Directives  Does Patient Have a Medical Advance Directive? No  Would patient like information on creating a medical advance directive? No - Patient declined    Vision: Annual vision screenings are recommended for early detection of glaucoma, cataracts, and diabetic retinopathy. These exams can also reveal signs of chronic conditions such as diabetes and  high blood pressure.  Dental: Annual dental screenings help detect early signs of oral cancer, gum disease, and other conditions linked to overall health, including heart disease and diabetes.  Please see the attached documents for additional preventive care recommendations.

## 2024-02-20 NOTE — Progress Notes (Signed)
 Chief Complaint  Patient presents with   Medicare Wellness     Subjective:   Robert [name] is a 73 y.o. male who presents for a Medicare Annual Wellness Visit.  Allergies (verified) Famotidine , Metformin and related, Oxycodone, Rosuvastatin, and Codeine   History: Past Medical History:  Diagnosis Date   Allergy    Arteriosclerotic cardiovascular disease (ASCVD)    DES to LAD D1 - 2003 in [place] ; failed intervention for a totally obstructed RCA at that time; EF of 45%; 12/2009: Equivocal stress nuclear with good exercise tolerance, negative stress EKG, normal EF with mild mid and distal inferior ischemia   Arthritis    Coronary artery disease    Diabetes mellitus, insulin  dependent (IDDM), controlled 10/25/2012   HBA1C is 6.9 on 10/22/2012, [employer]rees[employer] 04/26/2020   Diabetes mellitus, type II (HCC)    GERD (gastroesophageal reflux disease)    Hyperlipidemia    Lipid profile in 09/2011:128, 130, 33, 69   Hypertension    Lab  09/2011: Normal CMet ex G-133   Pneumonia    yrs ago   Prostate cancer (HCC)    Rash    right legt below knee using neosporin 2 to 3 x per day, area healing   Sleep apnea    no CPAP, can't tolerate   Tobacco abuse, in remission    30 pack years; quit in 1985   Wears glasses    Past Surgical History:  Procedure Laterality Date   BIOPSY  01/05/2022   Procedure: BIOPSY;  Surgeon: Eartha Angelia Sieving, MD;  [facility];  Service: Gastroenterology;;   BIOPSY  02/13/2023   Procedure: BIOPSY;  Surgeon: Eartha Angelia, Sieving, MD;  [facility];  Service: Gastroenterology;;   bone spur Left    foot big toe   CARDIAC CATHETERIZATION  2003   DES placed,  done in [place]    COLONOSCOPY  04/03/2008   Negative screening study   COLONOSCOPY N/A 11/11/2014   Procedure: COLONOSCOPY;  Surgeon: Claudis RAYMOND Rivet, MD;  [facility]: [facility];  Service: Endoscopy;  Laterality: N/A;  1030   COLONOSCOPY WITH PROPOFOL  N/A 01/05/2022    Procedure: COLONOSCOPY WITH PROPOFOL ;  Surgeon: Eartha Angelia Sieving, MD;  [facility];  Service: Gastroenterology;  Laterality: N/A;  730 ASA 2   EPIDIDYMIS SURGERY  04/03/1994   ESOPHAGOGASTRODUODENOSCOPY (EGD) WITH PROPOFOL  N/A 01/05/2022   Procedure: ESOPHAGOGASTRODUODENOSCOPY (EGD) WITH PROPOFOL ;  Surgeon: Eartha Angelia Sieving, MD;  [facility];  Service: Gastroenterology;  Laterality: N/A;   ESOPHAGOGASTRODUODENOSCOPY (EGD) WITH PROPOFOL  N/A 02/13/2023   Procedure: ESOPHAGOGASTRODUODENOSCOPY (EGD) WITH PROPOFOL ;  Surgeon: Eartha Angelia Sieving, MD;  [facility];  Service: Gastroenterology;  Laterality: N/A;  9:00AM;ASA 1   HERNIA REPAIR     umbilical as a baby   KNEE ARTHROSCOPY W/ MENISCECTOMY  11/01/2008   Right   POLYPECTOMY  01/05/2022   Procedure: POLYPECTOMY;  Surgeon: Eartha Angelia Sieving, MD;  [facility];  Service: Gastroenterology;;   PRESSURE ULCER DEBRIDEMENT  04/03/2004   Right lower extremity   RADIOACTIVE SEED IMPLANT N/A 12/21/2022   Procedure: RADIOACTIVE SEED IMPLANT/BRACHYTHERAPY IMPLANT;  Surgeon: Matilda Senior, MD;  Location: [facility];  Service: Urology;  Laterality: N/A;   ROTATOR CUFF REPAIR  07/02/2009   Right   SPACE OAR INSTILLATION N/A 12/21/2022   Procedure: SPACE OAR INSTILLATION;  Surgeon: Matilda Senior, MD;  Location: [facility];  Service: Urology;  Laterality: N/A;   TOTAL  KNEE ARTHROPLASTY Right 04/24/2022   Procedure: TOTAL KNEE ARTHROPLASTY;  Surgeon: Melodi Lerner, MD;  Location: [facility];  Service: Orthopedics;  Laterality: Right;   Family History  Problem Relation Age of Onset   Arthritis Mother    Cancer Father        Hypernephroma   Arthritis Father    Diabetes Sister    Hypertension Sister    Obesity Sister    Stroke Sister    Arthritis Brother    Diabetes Brother    Cancer Brother    Stroke Brother    Social History    Occupational History   Occupation: Retired  Tobacco Use   Smoking status: Former    Current packs/day: 0.00    Average packs/day: 1 pack/day for 30.0 years (30.0 ttl pk-yrs)    Types: Cigarettes    Start date: 04/03/1966    Quit date: 09/02/1983    Years since quitting: 40.4    Passive exposure: Past   Smokeless tobacco: Never  Vaping Use   Vaping status: Never Used  Substance and Sexual Activity   Alcohol use: No    Comment: Former therapist, nutritional -discontinued use in 2005   Drug use: No   Sexual activity: Not Currently   Tobacco Counseling Counseling given: Yes  SDOH Screenings   Food Insecurity: No Food Insecurity (02/20/2024)  Housing: Low Risk  (02/20/2024)  Transportation Needs: No Transportation Needs (02/20/2024)  Utilities: Not At Risk (02/20/2024)  Alcohol Screen: Low Risk  (02/12/2023)  Depression (PHQ2-9): Low Risk  (02/20/2024)  Financial Resource Strain: Low Risk  (12/03/2023)  Physical Activity: Sufficiently Active (02/20/2024)  Recent Concern: Physical Activity - Insufficiently Active (12/03/2023)  Social Connections: Socially Integrated (02/20/2024)  Stress: No Stress Concern Present (02/20/2024)  Tobacco Use: Medium Risk (02/20/2024)  Health Literacy: Adequate Health Literacy (02/20/2024)   See flowsheets for full screening details  Depression Screen PHQ 2 & 9 Depression Scale- Over the past 2 weeks, how often have you been bothered by any of the following problems? Little interest or pleasure in doing things: 0 Feeling down, depressed, or hopeless (PHQ Adolescent also includes...irritable): 0 PHQ-2 Total Score: 0 Trouble falling or staying asleep, or sleeping too much: 0 Feeling tired or having little energy: 0 Poor appetite or overeating (PHQ Adolescent also includes...weight loss): 0 Feeling bad about yourself - or that you are a failure or have let yourself or your family down: 0 Trouble concentrating on things, such as reading the newspaper or watching  television (PHQ Adolescent also includes...like school work): 0 Moving or speaking so slowly that other people could have noticed. Or the opposite - being so fidgety or restless that you have been moving around a lot more than usual: 0 Thoughts that you would be better off dead, or of hurting yourself in some way: 0 PHQ-9 Total Score: 0 If you checked off any problems, how difficult have these problems made it for you to do your work, take care of things at home, or get along with other people?: Not difficult at all  Depression Treatment Depression Interventions/Treatment : EYV7-0 Score <4 Follow-up Not Indicated     Goals Addressed             This Visit's Progress    Remain active and independent   On track      Visit info / Clinical Intake: Medicare Wellness Visit Type:: Subsequent Annual Wellness Visit Persons participating in visit:: patient Medicare Wellness Visit Mode:: Telephone If telephone:: video declined Because this  visit was a virtual/telehealth visit:: pt reported vitals If Telephone or Video please confirm:: I connected with the patient using audio enabled telemedicine application and verified that I am speaking with the correct person using two identifiers; I discussed the limitations of evaluation and management by telemedicine; The patient expressed understanding and agreed to proceed Patient Location:: home Provider Location:: office Information given by:: patient Interpreter Needed?: No Pre-visit prep was completed: yes AWV questionnaire completed by patient prior to visit?: yes Date:: 02/16/24 (completed some on 02/19/2024) Living arrangements:: lives with spouse/significant other Patient's Overall Health Status Rating: very good Typical amount of pain: none Does pain affect daily life?: no Are you currently prescribed opioids?: no  Dietary Habits and Nutritional Risks How many meals a day?: 2 Eats fruit and vegetables daily?: yes Most meals are  obtained by: preparing own meals In the last 2 weeks, have you had any of the following?: none Diabetic:: (!) yes Any non-healing wounds?: no How often do you check your BS?: continuous glucose monitor Would you like to be referred to a Nutritionist or for Diabetic Management? : no  Functional Status Activities of Daily Living (to include ambulation/medication): (Patient-Rptd) Independent Ambulation: (Patient-Rptd) Independent Medication Administration: Independent Home Management: (Patient-Rptd) Independent Manage your own finances?: yes Primary transportation is: driving Concerns about vision?: no *vision screening is required for WTM* Concerns about hearing?: no  Fall Screening Falls in the past year?: (Patient-Rptd) 0 Number of falls in past year: 0 Was there an injury with Fall?: 0 Fall Risk Category Calculator: 0 Patient Fall Risk Level: Low Fall Risk  Fall Risk Patient at Risk for Falls Due to: No Fall Risks Fall risk Follow up: Falls evaluation completed; Education provided; Falls prevention discussed  Home and Transportation Safety: All rugs have non-skid backing?: yes All stairs or steps have railings?: yes Grab bars in the bathtub or shower?: yes Have non-skid surface in bathtub or shower?: (!) no Good home lighting?: yes Regular seat belt use?: yes Hospital stays in the last year:: no (had TKA and in Jan 2024)  Cognitive Assessment Difficulty concentrating, remembering, or making decisions? : no Will 6CIT or Mini Cog be Completed: no 6CIT or Mini Cog Declined: patient alert, oriented, able to answer questions appropriately and recall recent events  Advance Directives (For Healthcare) Does Patient Have a Medical Advance Directive?: No Would patient like information on creating a medical advance directive?: No - Patient declined  Reviewed/Updated  Reviewed/Updated: Reviewed All (Medical, Surgical, Family, Medications, Allergies, Care Teams, Patient Goals)         Objective:    Today's Vitals   02/20/24 1101  BP: 125/64  Weight: 204 lb (92.5 kg)  Height: 5' 9 (1.753 m)   Body mass index is 30.13 kg/m.  Current Medications (verified) Outpatient Encounter Medications as of 02/20/2024  Medication Sig   acetaminophen  (TYLENOL ) 650 MG CR tablet Take 650 mg by mouth every 8 (eight) hours as needed for pain.   aspirin  EC 81 MG tablet Take 81 mg by mouth daily.   atorvastatin  (LIPITOR) 40 MG tablet Take 1 tablet (40 mg total) by mouth daily.   Continuous Glucose Receiver (FREESTYLE LIBRE 3 READER) DEVI Use as directed.   Continuous Glucose Sensor (FREESTYLE LIBRE 3 SENSOR) MISC Place 1 sensor on the skin every 14 days. Use to check glucose continuously   Dulaglutide  (TRULICITY ) 1.5 MG/0.5ML SOAJ Inject 1.5 mg into the skin once a week.   insulin  glargine (LANTUS  SOLOSTAR) 100 UNIT/ML Solostar Pen Inject 40  Units into the skin at bedtime.   Insulin  Pen Needle (INSUPEN PEN NEEDLES) 31G X 8 MM MISC Use as directed once daily   loratadine  (CLARITIN ) 10 MG tablet Take 1 tablet (10 mg total) by mouth daily.   metoprolol  succinate (TOPROL -XL) 50 MG 24 hr tablet Take 1 tablet (50 mg total) by mouth daily with or immediately following a meal   neomycin-bacitracin-polymyxin (NEOSPORIN) OINT Apply 1 Application topically 3 (three) times daily. To rash below right knee   nitroGLYCERIN  (NITROSTAT ) 0.4 MG SL tablet Place 1 tablet (0.4 mg total) under the tongue every 5 (five) minutes as needed for chest pain.   olmesartan  (BENICAR ) 20 MG tablet Take 1 tablet (20 mg total) by mouth daily.   sildenafil  (VIAGRA ) 100 MG tablet Take 0.5-1 tablets (50-100 mg total) by mouth as needed.   UNABLE TO FIND Med Name: Covid Vaccine 2025   Vitamin D , Ergocalciferol , (DRISDOL ) 1.25 MG (50000 UNIT) CAPS capsule Take 1 capsule (50,000 Units total) by mouth every 7 (seven) days.   No facility-administered encounter medications on file as of 02/20/2024.   Hearing/Vision  screen Hearing Screening - Comments:: Patient denies any hearing difficulties.   Vision Screening - Comments:: Wears rx glasses - up to date with routine eye exams with  Dr. Darroll [facility] [place] location Immunizations and Health Maintenance Health Maintenance  Topic Date Due   COVID-19 Vaccine (8 - 2025-26 season) 12/03/2023   Medicare Annual Wellness (AWV)  02/14/2024   Diabetic kidney evaluation - Urine ACR  03/07/2024   HEMOGLOBIN A1C  06/02/2024   OPHTHALMOLOGY EXAM  06/27/2024   Diabetic kidney evaluation - eGFR measurement  08/06/2024   FOOT EXAM  12/09/2024   Colonoscopy  01/05/2029   DTaP/Tdap/Td (3 - Td or Tdap) 07/27/2032   Pneumococcal Vaccine: 50+ Years  Completed   Influenza Vaccine  Completed   Hepatitis C Screening  Completed   Zoster Vaccines- Shingrix  Completed   Meningococcal B Vaccine  Aged Out        Assessment/Plan:  This is a routine wellness examination for [place].  Patient Care Team: Antonetta Rollene BRAVO, MD as PCP - General Vertell Pont, RN as Oncology Nurse Navigator Matilda Senior, MD as Consulting Physician (Urology) Patrcia Cough, MD as Consulting Physician (Radiation Oncology) Starla Wendelyn BIRCH, RN as Registered Nurse Branch, Dorn FALCON, MD as Consulting Physician (Cardiology) Gaynel Delon CROME, DPM as Consulting Physician (Podiatry)  I have personally reviewed and noted the following in the patient's chart:   Medical and social history Use of alcohol, tobacco or illicit drugs  Current medications and supplements including opioid prescriptions. Functional ability and status Nutritional status Physical activity Advanced directives List of other physicians Hospitalizations, surgeries, and ER visits in previous 12 months Vitals Screenings to include cognitive, depression, and falls Referrals and appointments  No orders of the defined types were placed in this encounter.  In addition, I have reviewed and discussed with  patient certain preventive protocols, quality metrics, and best practice recommendations. A written personalized care plan for preventive services as well as general preventive health recommendations were provided to patient.   Abdurrahman Petersheim, CMA   02/20/2024   No follow-ups on file.  After Visit Summary: (MyChart) Due to this being a telephonic visit, the after visit summary with patients personalized plan was offered to patient via MyChart   Nurse Notes: n/a

## 2024-03-01 IMAGING — DX DG KNEE COMPLETE 4+V*R*
4 series · 4 of 4 positions shown · non-contrast
Comparison: None.

CLINICAL DATA: Chronic right knee pain.

EXAM:
RIGHT KNEE - COMPLETE 4+ VIEW

[knee ap]
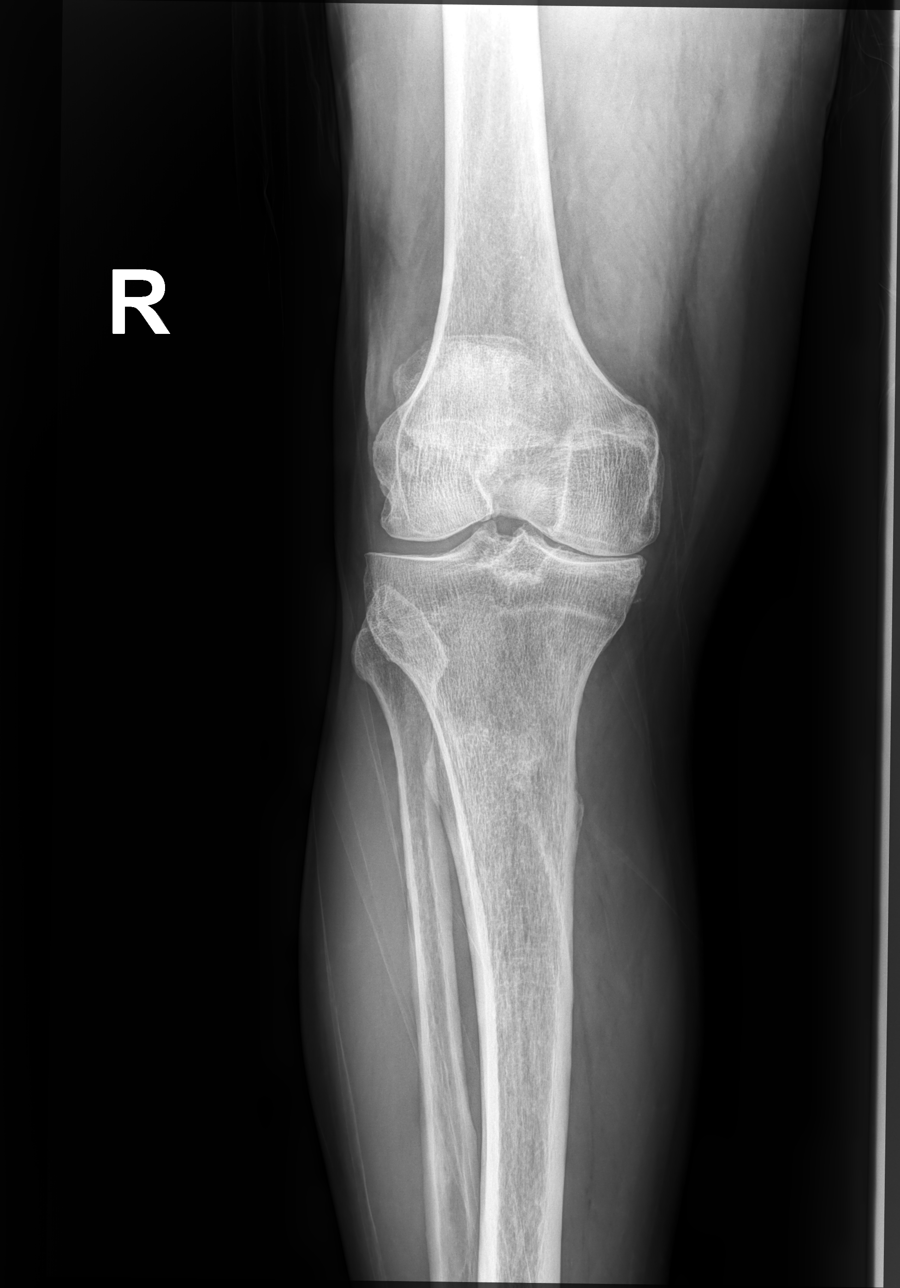

[knee mlo (1 of 2)]
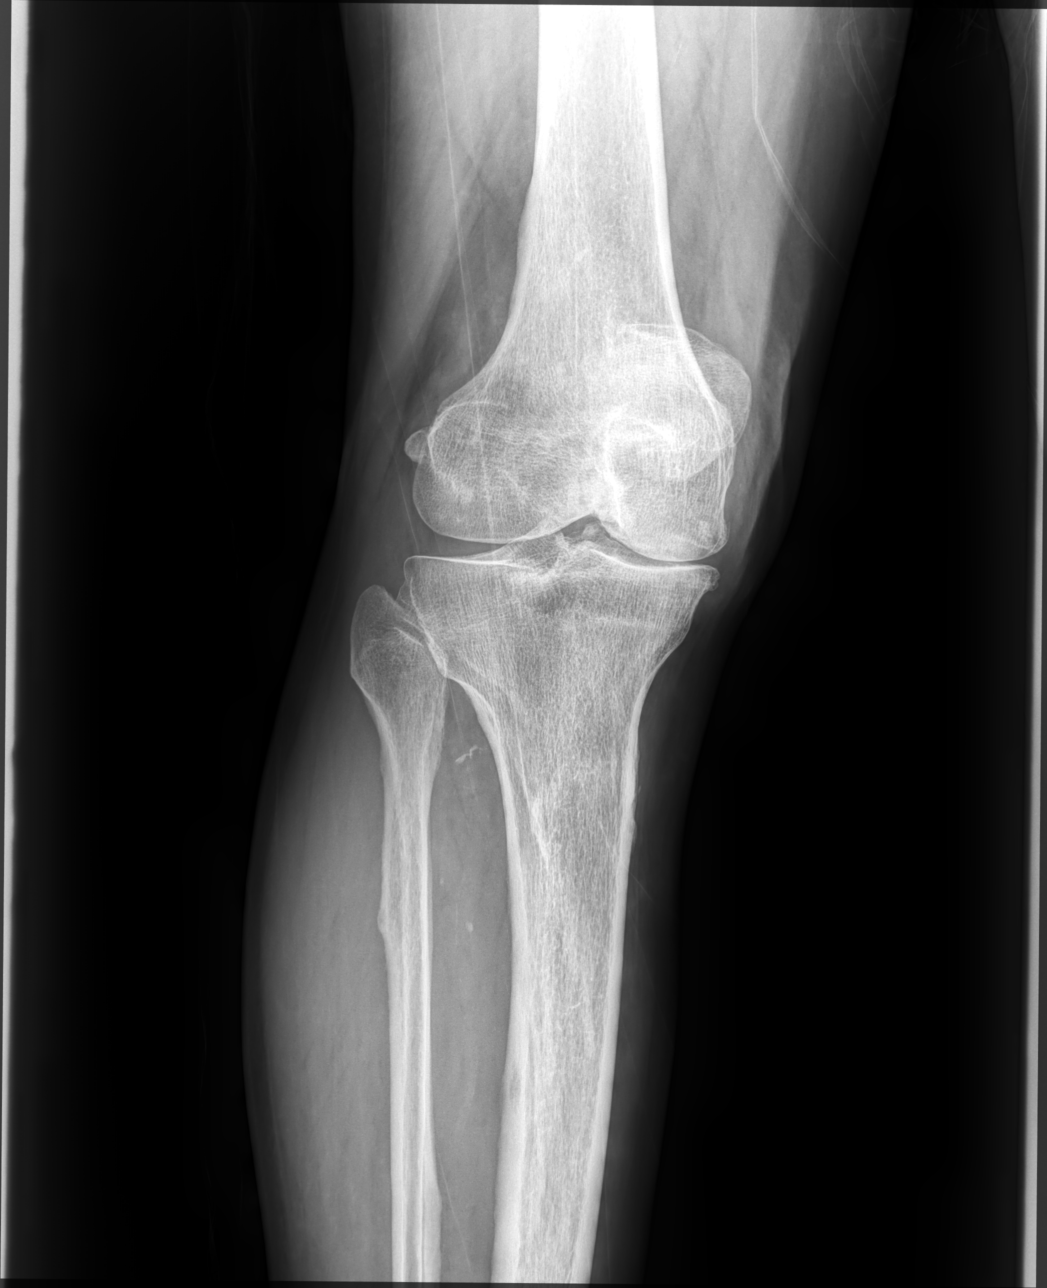

[knee mlo (2 of 2)]
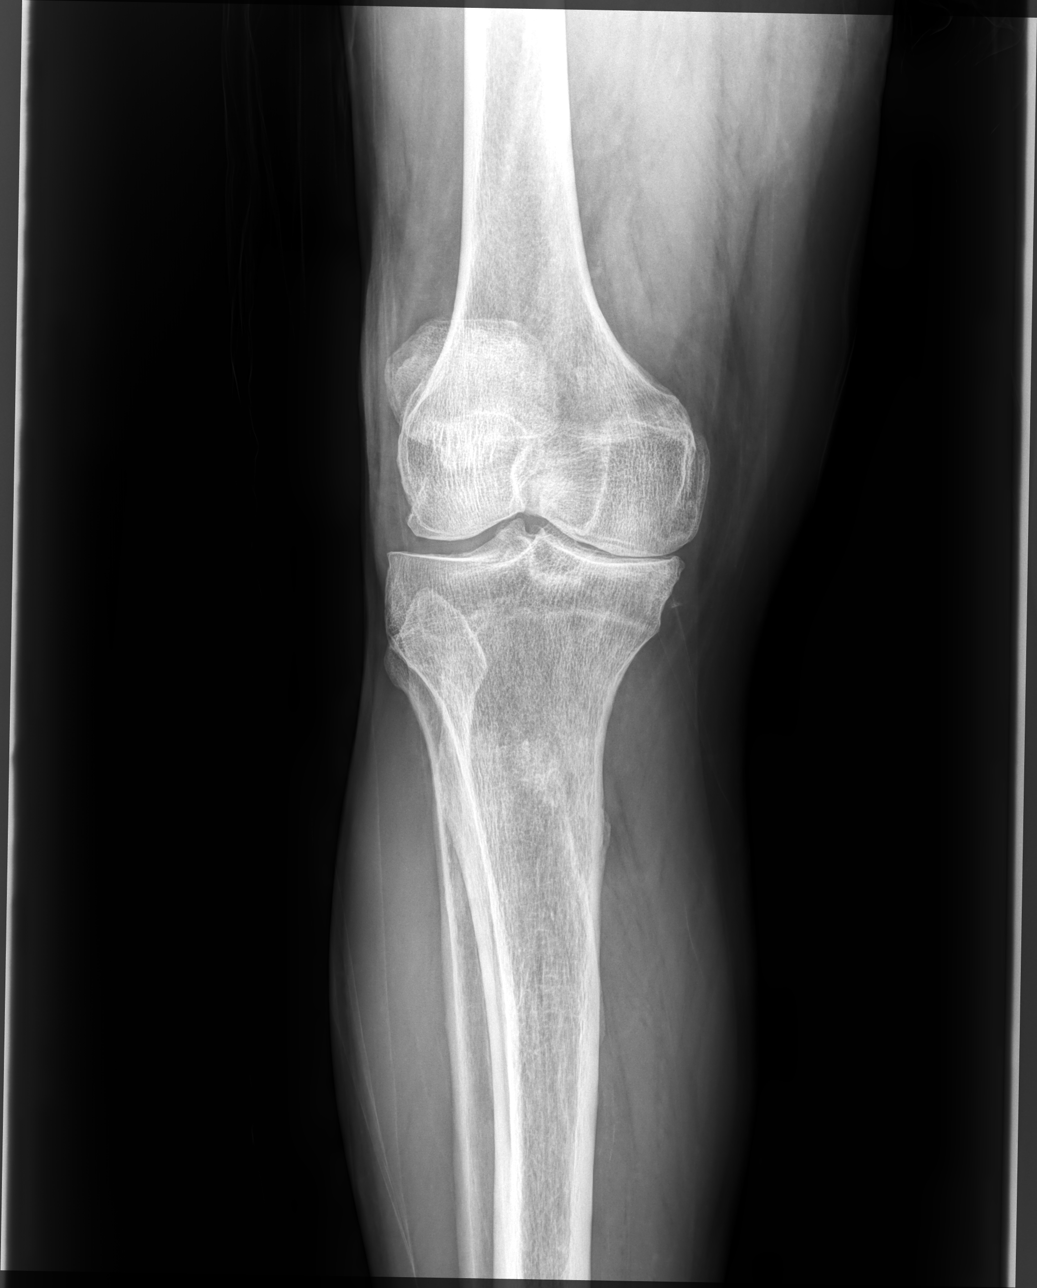

[knee lat]
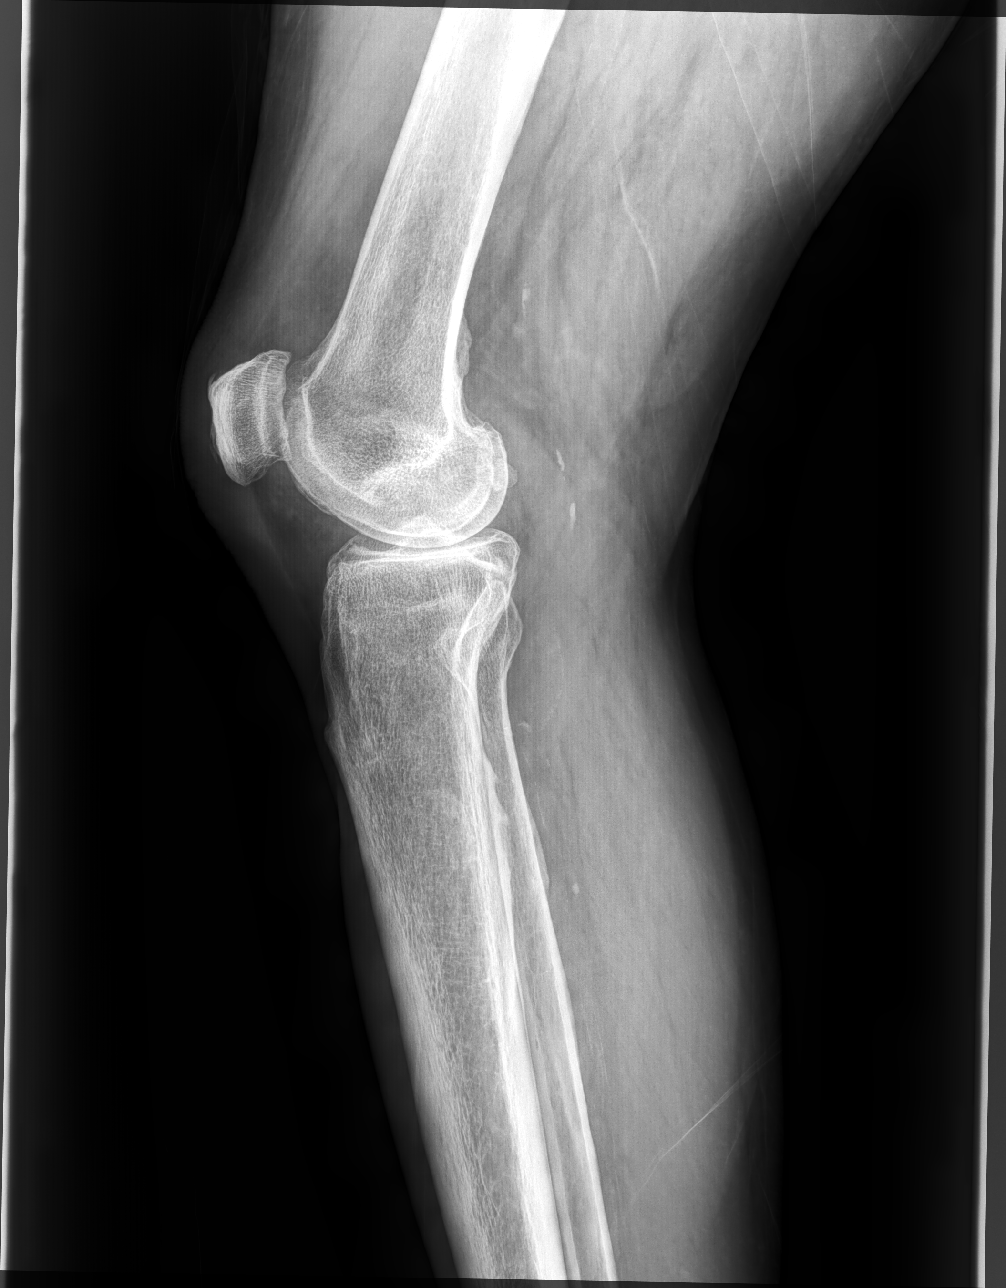

[4 of 4 positions shown; findings below may reference images not displayed]

FINDINGS: No evidence of fracture, dislocation, or joint effusion. Severe
narrowing of medial joint space is noted with osteophyte formation.
Soft tissues are unremarkable.
IMPRESSION: Severe degenerative joint disease is noted medially. No acute
abnormality is noted.

## 2024-03-12 ENCOUNTER — Ambulatory Visit: Admitting: Podiatry

## 2024-03-12 DIAGNOSIS — B351 Tinea unguium: Secondary | ICD-10-CM | POA: Diagnosis not present

## 2024-03-12 DIAGNOSIS — Q828 Other specified congenital malformations of skin: Secondary | ICD-10-CM

## 2024-03-12 DIAGNOSIS — M79675 Pain in left toe(s): Secondary | ICD-10-CM | POA: Diagnosis not present

## 2024-03-12 DIAGNOSIS — M79674 Pain in right toe(s): Secondary | ICD-10-CM | POA: Diagnosis not present

## 2024-03-12 DIAGNOSIS — E1151 Type 2 diabetes mellitus with diabetic peripheral angiopathy without gangrene: Secondary | ICD-10-CM

## 2024-03-21 ENCOUNTER — Encounter: Payer: Self-pay | Admitting: Podiatry

## 2024-03-21 ENCOUNTER — Other Ambulatory Visit (HOSPITAL_COMMUNITY): Payer: Self-pay

## 2024-03-21 NOTE — Progress Notes (Signed)
"  °  Subjective:  Patient ID: Robert [name], male    DOB: [birth year],  MRN: [id number]  [name] presents to clinic today for at risk foot care. Pt has h/o NIDDM with PAD and painful porokeratotic lesion(s) b/l lower extremities and painful mycotic toenails that limit ambulation. Painful toenails interfere with ambulation. Aggravating factors include wearing enclosed shoe gear. Pain is relieved with periodic professional debridement. Painful porokeratotic lesions are aggravated when weightbearing with and without shoegear. Pain is relieved with periodic professional debridement.   New problem(s): None.   PCP is Antonetta Rollene BRAVO, MD.LOV 12/07/23.  Allergies[1]  Review of Systems: Negative except as noted in the HPI.  Objective: No changes noted in today's physical examination. There were no vitals filed for this visit. Robert [name] is a pleasant 73 y.o. male WD, WN in NAD. AAO x 3.  Vascular Examination: Vascular status intact b/l with palpable pedal pulses. CFT immediate b/l. Pedal hair present. No edema. No pain with calf compression b/l. Skin temperature gradient WNL b/l. No varicosities noted. No cyanosis or clubbing noted.  Neurological Examination: Sensation grossly intact b/l with 10 gram monofilament. Vibratory sensation intact b/l.  Dermatological Examination: Pedal skin with normal turgor, texture and tone b/l. No open wounds nor interdigital macerations noted. Toenails 1-5 b/l thick, discolored, elongated with subungual debris and pain on dorsal palpation.   Porokeratotic lesion(s) x 6 total plantar aspect of both feet. No erythema, no edema, no drainage, no fluctuance.  Musculoskeletal Examination: Muscle strength 5/5 to b/l LE.  No pain, crepitus noted b/l. Pes planus deformity noted bilateral LE. Patient ambulates independently without assistive aids.   Radiographs: None  Assessment/Plan: 1. Pain due to onychomycosis of toenails of both feet   2.  Porokeratosis   3. Type II diabetes mellitus with peripheral circulatory disorder [facility])     Patient was evaluated and treated. All patient's and/or POA's questions/concerns addressed on today's visit. Toenails 1-5 b/l debrided in length and girth without incident by assistant Cagni Ellerbe. Porokeratotic lesion(s) x 4 plantar aspect left foot pared with sharp debridement without incident by me. Continue daily foot inspections and monitor blood glucose per PCP/Endocrinologist's recommendations. Continue soft, supportive shoe gear daily. Report any pedal injuries to medical professional. Call office if there are any questions/concerns. Return in about 3 months (around 06/10/2024).  Delon LITTIE Merlin, DPM      [place] LOCATION: [address], [place] [address]                   Office [phone]   [place] LOCATION: [address] [place], [place] [address] Office [phone]     [1]  Allergies Allergen Reactions   Famotidine     Metformin And Related Other (See Comments)    Stomach upset   Oxycodone     Makes me foolish, did not help with pain   Rosuvastatin Other (See Comments)    Adverse GI symptoms   Codeine     Did not work and made me feel foolish   "

## 2024-03-21 NOTE — Progress Notes (Signed)
 PAPE PARSON                                          MRN: [id number]   03/21/2024   The [facility] Quality Team Specialist reviewed this patient medical record for the purposes of chart review for care gap closure. The following were reviewed: chart review for care gap closure-kidney health evaluation for diabetes:uACR.    [facility] Quality Team

## 2024-04-01 ENCOUNTER — Other Ambulatory Visit

## 2024-04-07 NOTE — Progress Notes (Signed)
 "   Impression/Assessment:  --GG 2 PCA, doing well after brachytherapy.  PSA pending  --BPH with minimal LUTS  Plan:  - I will check his PSA today  - I will have him come back to see a doctor in this office in 6 months  - Trial prescription for tamsulosin  sent in     History of Present Illness: 74 yo male presents forPCA conference.  4.9.2024: TRUS/Bx. Prostate volume 43 ml, PSA 4.3, PSAD 0.10. 6/10 cores positive 3 cores (Lt mid lat, Lt apex lat, Lt apex medial) revealed GG 1 cancer in 5/10/5 % of cores, respectively 3 cores (Rt base lat, Rt mid medial, Rt apex lat) revealed GG 2 cancer in 20, 10 and 50% of cores respectively  9.19.2024: He underwent I 125 brachytherapy/[facility]   2.25.2025: Recent PSA 1.4  7.1.2025: Here today for recheck. No blood in urine or stool. IPSS 10/1.  Has ED but is not in favor of treatment at this point.  He was given alfuzosin  at his last visit.  He thinks it caused him to have blood in his stool.  He is not on that medication anymore.  His urinary symptoms do not bother him that much.  PSA 0.7  1.6.2026: Here today for routine check.  He is doing well, no blood in his urine.  He is still off of alfuzosin .  Although he does have a low IPSS, he would like to consider medicine to help the stream a bit.  He thinks alfuzosin  caused blood in the stool.  He is no longer sexually active.    Past Medical History:  Diagnosis Date   Allergy    Arteriosclerotic cardiovascular disease (ASCVD)    DES to LAD D1 - 2003 in [place] ; failed intervention for a totally obstructed RCA at that time; EF of 45%; 12/2009: Equivocal stress nuclear with good exercise tolerance, negative stress EKG, normal EF with mild mid and distal inferior ischemia   Arthritis    Coronary artery disease    Diabetes mellitus, insulin  dependent (IDDM), controlled 10/25/2012   HBA1C is 6.9 on 10/22/2012, [employer]rees[employer] 04/26/2020   Diabetes mellitus, type II (HCC)    GERD (gastroesophageal  reflux disease)    Hyperlipidemia    Lipid profile in 09/2011:128, 130, 33, 69   Hypertension    Lab  09/2011: Normal CMet ex G-133   Pneumonia    yrs ago   Prostate cancer (HCC)    Rash    right legt below knee using neosporin 2 to 3 x per day, area healing   Sleep apnea    no CPAP, can't tolerate   Tobacco abuse, in remission    30 pack years; quit in 1985   Wears glasses     Past Surgical History:  Procedure Laterality Date   BIOPSY  01/05/2022   Procedure: BIOPSY;  Surgeon: Eartha Angelia Sieving, MD;  Location: [facility];  Service: Gastroenterology;;   BIOPSY  02/13/2023   Procedure: BIOPSY;  Surgeon: Eartha Angelia, Sieving, MD;  Location: [facility];  Service: Gastroenterology;;   bone spur Left    foot big toe   CARDIAC CATHETERIZATION  2003   DES placed,  done in [place]    COLONOSCOPY  04/03/2008   Negative screening study   COLONOSCOPY N/A 11/11/2014   Procedure: COLONOSCOPY;  Surgeon: Claudis RAYMOND Rivet, MD;  Location: [facility];  Service: Endoscopy;  Laterality: N/A;  1030   COLONOSCOPY WITH PROPOFOL  N/A 01/05/2022   Procedure: COLONOSCOPY WITH PROPOFOL ;  Surgeon: Eartha Flavors, Toribio, MD;  Location: [facility];  Service: Gastroenterology;  Laterality: N/A;  730 ASA 2   EPIDIDYMIS SURGERY  04/03/1994   ESOPHAGOGASTRODUODENOSCOPY (EGD) WITH PROPOFOL  N/A 01/05/2022   Procedure: ESOPHAGOGASTRODUODENOSCOPY (EGD) WITH PROPOFOL ;  Surgeon: Eartha Flavors Toribio, MD;  [facility]: [facility];  Service: Gastroenterology;  Laterality: N/A;   ESOPHAGOGASTRODUODENOSCOPY (EGD) WITH PROPOFOL  N/A 02/13/2023   Procedure: ESOPHAGOGASTRODUODENOSCOPY (EGD) WITH PROPOFOL ;  Surgeon: Eartha Flavors Toribio, MD;  Location: [facility];  Service: Gastroenterology;  Laterality: N/A;  9:00AM;ASA 1   HERNIA REPAIR     umbilical as a baby   KNEE ARTHROSCOPY W/ MENISCECTOMY  11/01/2008   Right   POLYPECTOMY  01/05/2022   Procedure: POLYPECTOMY;  Surgeon:  Eartha Flavors Toribio, MD;  Location: [facility];  Service: Gastroenterology;;   PRESSURE ULCER DEBRIDEMENT  04/03/2004   Right lower extremity   RADIOACTIVE SEED IMPLANT N/A 12/21/2022   Procedure: RADIOACTIVE SEED IMPLANT/BRACHYTHERAPY IMPLANT;  Surgeon: Matilda Senior, MD;  Location: [facility];  Service: Urology;  Laterality: N/A;   ROTATOR CUFF REPAIR  07/02/2009   Right   SPACE OAR INSTILLATION N/A 12/21/2022   Procedure: SPACE OAR INSTILLATION;  Surgeon: Matilda Senior, MD;  Location: [facility];  Service: Urology;  Laterality: N/A;   TOTAL KNEE ARTHROPLASTY Right 04/24/2022   Procedure: TOTAL KNEE ARTHROPLASTY;  Surgeon: Melodi Lerner, MD;  Location: [facility];  Service: Orthopedics;  Laterality: Right;    Home Medications:  Allergies as of 04/08/2024       Reactions   Famotidine     Metformin And Related Other (See Comments)   Stomach upset   Oxycodone    Makes me foolish, did not help with pain   Rosuvastatin Other (See Comments)   Adverse GI symptoms   Codeine    Did not work and made me feel foolish        Medication List        Accurate as of April 07, 2024  3:19 PM. If you have any questions, ask your nurse or doctor.          acetaminophen  650 MG CR tablet Commonly known as: TYLENOL  Take 650 mg by mouth every 8 (eight) hours as needed for pain.   aspirin  EC 81 MG tablet Take 81 mg by mouth daily.   atorvastatin  40 MG tablet Commonly known as: LIPITOR Take 1 tablet (40 mg total) by mouth daily.   FreeStyle [place] 3 Reader [place] Use as directed.   FreeStyle Libre 3 Sensor Misc Place 1 sensor on the skin every 14 days. Use to check glucose continuously   Insupen Pen Needles 31G X 8 MM Misc Generic drug: Insulin  Pen Needle Use as directed once daily   Lantus  SoloStar 100 UNIT/ML Solostar Pen Generic drug: insulin  glargine Inject 40 Units into the skin at bedtime.   loratadine  10 MG  tablet Commonly known as: CLARITIN  Take 1 tablet (10 mg total) by mouth daily.   metoprolol  succinate 50 MG 24 hr tablet Commonly known as: TOPROL -XL Take 1 tablet (50 mg total) by mouth daily with or immediately following a meal   neomycin-bacitracin-polymyxin Oint Commonly known as: NEOSPORIN Apply 1 Application topically 3 (three) times daily. To rash below right knee   nitroGLYCERIN  0.4 MG SL tablet Commonly known as: NITROSTAT  Place 1 tablet (0.4 mg total) under the tongue every 5 (five) minutes as needed for chest pain.   olmesartan  20 MG tablet Commonly known as: BENICAR   Take 1 tablet (20 mg total) by mouth daily.   sildenafil  100 MG tablet Commonly known as: VIAGRA  Take 0.5-1 tablets (50-100 mg total) by mouth as needed.   Trulicity  1.5 MG/0.5ML Soaj Generic drug: Dulaglutide  Inject 1.5 mg into the skin once a week.   UNABLE TO FIND Med Name: Covid Vaccine 2025   Vitamin D  (Ergocalciferol ) 1.25 MG (50000 UNIT) Caps capsule Commonly known as: DRISDOL  Take 1 capsule (50,000 Units total) by mouth every 7 (seven) days.        Allergies:  Allergies  Allergen Reactions   Famotidine     Metformin And Related Other (See Comments)    Stomach upset   Oxycodone     Makes me foolish, did not help with pain   Rosuvastatin Other (See Comments)    Adverse GI symptoms   Codeine     Did not work and made me feel foolish    Family History  Problem Relation Age of Onset   Arthritis Mother    Cancer Father        Hypernephroma   Arthritis Father    Diabetes Sister    Hypertension Sister    Obesity Sister    Stroke Sister    Arthritis Brother    Diabetes Brother    Cancer Brother    Stroke Brother     Social History:  reports that he quit smoking about 40 years ago. His smoking use included cigarettes. He started smoking about 58 years ago. He has a 30 pack-year smoking history. He has been exposed to tobacco smoke. He has never used smokeless tobacco. He  reports that he does not drink alcohol and does not use drugs.  ROS: A complete review of systems was performed.  All systems are negative except for pertinent findings as noted.  Physical Exam:  Vital signs in last 24 hours: There were no vitals taken for this visit. Constitutional:  Alert and oriented, No acute distress Cardiovascular: Regular rate  Respiratory: Normal respiratory effort GU: Normal anal sphincter tone.  Prostate flat/small, no nodules.  No rectal masses Neurologic: Grossly intact, no focal deficits Psychiatric: Normal mood and affect  I have reviewed prior pt notes  I have reviewed urinalysis results--clear  I have independently reviewed prior imaging  I have reviewed prior PSA/path  results  IPSS sheet reviewed--7/1    "

## 2024-04-08 ENCOUNTER — Ambulatory Visit: Admitting: Urology

## 2024-04-08 ENCOUNTER — Other Ambulatory Visit (HOSPITAL_COMMUNITY): Payer: Self-pay

## 2024-04-08 ENCOUNTER — Other Ambulatory Visit: Payer: Self-pay

## 2024-04-08 VITALS — BP 125/73 | HR 85

## 2024-04-08 DIAGNOSIS — Z923 Personal history of irradiation: Secondary | ICD-10-CM | POA: Diagnosis not present

## 2024-04-08 DIAGNOSIS — Z8546 Personal history of malignant neoplasm of prostate: Secondary | ICD-10-CM

## 2024-04-08 DIAGNOSIS — N401 Enlarged prostate with lower urinary tract symptoms: Secondary | ICD-10-CM

## 2024-04-08 DIAGNOSIS — N5201 Erectile dysfunction due to arterial insufficiency: Secondary | ICD-10-CM

## 2024-04-08 LAB — URINALYSIS, ROUTINE W REFLEX MICROSCOPIC
Bilirubin, UA: NEGATIVE
Glucose, UA: NEGATIVE
Ketones, UA: NEGATIVE
Leukocytes,UA: NEGATIVE
Nitrite, UA: NEGATIVE
RBC, UA: NEGATIVE
Specific Gravity, UA: 1.03 (ref 1.005–1.030)
Urobilinogen, Ur: 0.2 mg/dL (ref 0.2–1.0)
pH, UA: 5.5 (ref 5.0–7.5)

## 2024-04-08 MED ORDER — TAMSULOSIN HCL 0.4 MG PO CAPS
0.4000 mg | ORAL_CAPSULE | Freq: Every day | ORAL | 11 refills | Status: AC
  Filled 2024-04-08: qty 30, 30d supply, fill #0
  Filled 2024-05-04: qty 30, 30d supply, fill #1

## 2024-04-09 ENCOUNTER — Ambulatory Visit: Payer: Self-pay | Admitting: Urology

## 2024-04-09 LAB — PSA: Prostate Specific Ag, Serum: 0.5 ng/mL (ref 0.0–4.0)

## 2024-04-15 ENCOUNTER — Other Ambulatory Visit: Payer: Self-pay | Admitting: Cardiology

## 2024-04-15 ENCOUNTER — Other Ambulatory Visit (HOSPITAL_COMMUNITY): Payer: Self-pay

## 2024-04-15 DIAGNOSIS — I10 Essential (primary) hypertension: Secondary | ICD-10-CM

## 2024-04-15 DIAGNOSIS — I2510 Atherosclerotic heart disease of native coronary artery without angina pectoris: Secondary | ICD-10-CM

## 2024-04-15 MED ORDER — OLMESARTAN MEDOXOMIL 20 MG PO TABS
20.0000 mg | ORAL_TABLET | Freq: Every day | ORAL | 3 refills | Status: AC
  Filled 2024-04-15: qty 90, 90d supply, fill #0

## 2024-04-15 MED ORDER — ATORVASTATIN CALCIUM 40 MG PO TABS
40.0000 mg | ORAL_TABLET | Freq: Every day | ORAL | 3 refills | Status: AC
  Filled 2024-04-15: qty 90, 90d supply, fill #0

## 2024-04-18 ENCOUNTER — Other Ambulatory Visit: Payer: Self-pay

## 2024-04-23 ENCOUNTER — Ambulatory Visit: Payer: Self-pay | Admitting: Family Medicine

## 2024-04-23 LAB — CBC WITH DIFFERENTIAL/PLATELET
Basophils Absolute: 0.1 x10E3/uL (ref 0.0–0.2)
Basos: 1 %
EOS (ABSOLUTE): 0.3 x10E3/uL (ref 0.0–0.4)
Eos: 8 %
Hematocrit: 41.5 % (ref 37.5–51.0)
Hemoglobin: 13.4 g/dL (ref 13.0–17.7)
Immature Grans (Abs): 0 x10E3/uL (ref 0.0–0.1)
Immature Granulocytes: 0 %
Lymphocytes Absolute: 1.3 x10E3/uL (ref 0.7–3.1)
Lymphs: 31 %
MCH: 29.8 pg (ref 26.6–33.0)
MCHC: 32.3 g/dL (ref 31.5–35.7)
MCV: 92 fL (ref 79–97)
Monocytes Absolute: 0.4 x10E3/uL (ref 0.1–0.9)
Monocytes: 10 %
Neutrophils Absolute: 2.1 x10E3/uL (ref 1.4–7.0)
Neutrophils: 50 %
Platelets: 240 x10E3/uL (ref 150–450)
RBC: 4.5 x10E6/uL (ref 4.14–5.80)
RDW: 13.6 % (ref 11.6–15.4)
WBC: 4.1 x10E3/uL (ref 3.4–10.8)

## 2024-04-23 LAB — CMP14+EGFR
ALT: 20 IU/L (ref 0–44)
AST: 20 IU/L (ref 0–40)
Albumin: 4.2 g/dL (ref 3.8–4.8)
Alkaline Phosphatase: 133 IU/L — ABNORMAL HIGH (ref 47–123)
BUN/Creatinine Ratio: 12 (ref 10–24)
BUN: 15 mg/dL (ref 8–27)
Bilirubin Total: 0.4 mg/dL (ref 0.0–1.2)
CO2: 22 mmol/L (ref 20–29)
Calcium: 9 mg/dL (ref 8.6–10.2)
Chloride: 104 mmol/L (ref 96–106)
Creatinine, Ser: 1.26 mg/dL (ref 0.76–1.27)
Globulin, Total: 2.7 g/dL (ref 1.5–4.5)
Glucose: 106 mg/dL — ABNORMAL HIGH (ref 70–99)
Potassium: 4.8 mmol/L (ref 3.5–5.2)
Sodium: 141 mmol/L (ref 134–144)
Total Protein: 6.9 g/dL (ref 6.0–8.5)
eGFR: 61 mL/min/1.73

## 2024-04-23 LAB — VITAMIN D 25 HYDROXY (VIT D DEFICIENCY, FRACTURES): Vit D, 25-Hydroxy: 56.3 ng/mL (ref 30.0–100.0)

## 2024-04-23 LAB — HEMOGLOBIN A1C
Est. average glucose Bld gHb Est-mCnc: 160 mg/dL
Hgb A1c MFr Bld: 7.2 % — ABNORMAL HIGH (ref 4.8–5.6)

## 2024-04-23 LAB — TSH: TSH: 1.17 u[IU]/mL (ref 0.450–4.500)

## 2024-04-25 ENCOUNTER — Encounter: Payer: Self-pay | Admitting: Family Medicine

## 2024-04-25 ENCOUNTER — Ambulatory Visit: Admitting: Family Medicine

## 2024-04-25 ENCOUNTER — Other Ambulatory Visit (HOSPITAL_COMMUNITY): Payer: Self-pay

## 2024-04-25 ENCOUNTER — Other Ambulatory Visit: Payer: Self-pay

## 2024-04-25 VITALS — BP 117/69 | HR 76 | Resp 16 | Ht 69.0 in | Wt 214.0 lb

## 2024-04-25 DIAGNOSIS — I10 Essential (primary) hypertension: Secondary | ICD-10-CM

## 2024-04-25 DIAGNOSIS — Z0001 Encounter for general adult medical examination with abnormal findings: Secondary | ICD-10-CM

## 2024-04-25 DIAGNOSIS — Z0000 Encounter for general adult medical examination without abnormal findings: Secondary | ICD-10-CM

## 2024-04-25 DIAGNOSIS — M5410 Radiculopathy, site unspecified: Secondary | ICD-10-CM

## 2024-04-25 DIAGNOSIS — E1159 Type 2 diabetes mellitus with other circulatory complications: Secondary | ICD-10-CM | POA: Diagnosis not present

## 2024-04-25 DIAGNOSIS — E785 Hyperlipidemia, unspecified: Secondary | ICD-10-CM

## 2024-04-25 MED ORDER — CYCLOBENZAPRINE HCL 5 MG PO TABS
ORAL_TABLET | ORAL | 1 refills | Status: AC
  Filled 2024-04-25: qty 45, 45d supply, fill #0

## 2024-04-25 MED ORDER — TRULICITY 3 MG/0.5ML ~~LOC~~ SOAJ
3.0000 mg | SUBCUTANEOUS | 1 refills | Status: AC
  Filled 2024-04-25 – 2024-05-05 (×2): qty 6, 84d supply, fill #0

## 2024-04-25 MED ORDER — ACCU-CHEK SMARTVIEW VI STRP
ORAL_STRIP | 12 refills | Status: AC
  Filled 2024-04-25: qty 100, 50d supply, fill #0

## 2024-04-25 NOTE — Assessment & Plan Note (Signed)

## 2024-04-25 NOTE — Assessment & Plan Note (Addendum)
 6 month h/o back pain radiaiting to posterior knee affecting ADL Refer PT x 6 weeks, bedtime muscle relaxant as needed for limited time Topical rubs, warm and cold compresses and massage

## 2024-04-25 NOTE — Progress Notes (Signed)
 "  Robert [name]     MRN: [id number]      DOB: [birth year]  Chief Complaint  Patient presents with   Medical Management of Chronic Issues    4 month follow up    Back Pain    Pt complains of ongoing back pain and stiffness for years, stiffness getting worse over the last couple months, better with movement, heat, and massage     HPI: Patient is in for annual physical exam.  Recent labs,  are reviewed. Immunization is reviewed , and  updated if needed.    PE; BP 117/69   Pulse 76   Resp 16   Ht 5' 9 (1.753 m)   Wt 214 lb 0.6 oz (97.1 kg)   SpO2 96%   BMI 31.61 kg/m   Pleasant male, alert and oriented x 3, in no cardio-pulmonary distress. Afebrile. HEENT No facial trauma or asymetry. Sinuses non tender. EOMI External ears normal,  Neck: supple, no adenopathy,JVD or thyromegaly.No bruits.  Chest: Clear to ascultation bilaterally.No crackles or wheezes. Non tender to palpation  Cardiovascular system; Heart sounds normal,  S1 and  S2 ,no S3.  No murmur, or thrill. Apical beat not displaced Peripheral pulses normal.  Abdomen: Soft, non tender,  Musculoskeletal exam: Decreased  ROM of lumbar spine, adequate in  hips , shoulders and knees. Mild  deformity ,swelling and  crepitus noted.in knees No muscle wasting or atrophy.   Neurologic: Cranial nerves 2 to 12 intact. Power, tone ,sensation  normal throughout. No disturbance in gait. No tremor.  Skin: Intact, no ulceration, erythema , scaling or rash noted. Pigmentation normal throughout  Psych; Normal mood and affect. Judgement and concentration normal   Assessment & Plan:  Back pain with right-sided radiculopathy 6 month h/o back pain radiaiting to posterior knee affecting ADL Refer PT x 6 weeks, bedtime muscle relaxant as needed for limited time Topical rubs, warm and cold compresses and massage  Type 2 diabetes mellitus with vascular disease (HCC) Diabetes associated with hypertension,  hyperlipidemia, obesity, and arthritis  Robert [name] is reminded of the importance of commitment to daily physical activity for 30 minutes or more, as able and the need to limit carbohydrate intake to 30 to 60 grams per meal to help with blood sugar control.   The need to take medication as prescribed, test blood sugar as directed, and to call between visits if there is a concern that blood sugar is uncontrolled is also discussed.   Robert [name] is reminded of the importance of daily foot exam, annual eye examination, and good blood sugar, blood pressure and cholesterol control.     Latest Ref Rng & Units 04/22/2024    8:51 AM 12/04/2023    8:01 AM 08/07/2023    8:10 AM 03/08/2023    9:40 AM 03/06/2023    8:30 AM  Diabetic Labs  HbA1c 4.8 - 5.6 % 7.2  7.0  7.2   7.2   Micro/Creat Ratio 0 - 29 mg/g creat    29    Chol 100 - 199 mg/dL   872     HDL >60 mg/dL   40     Calc LDL 0 - 99 mg/dL   65     Triglycerides 0 - 149 mg/dL   877     Creatinine 9.23 - 1.27 mg/dL 8.73   8.73   8.72       04/25/2024    2:53 PM 04/08/2024   10:41 AM 02/20/2024  11:01 AM 02/04/2024    4:47 PM 02/04/2024    3:59 PM 12/11/2023   12:29 PM 12/07/2023    3:01 PM  BP/Weight  Systolic BP 117 125 125 140 146 119 134  Diastolic BP 69 73 64 75 74 73 66  Wt. (Lbs) 214.04  204  204 210 212.04  BMI 31.61 kg/m2  30.13 kg/m2  30.13 kg/m2 31.01 kg/m2 31.31 kg/m2      Latest Ref Rng & Units 01/24/2023    8:30 AM 06/22/2022   12:00 AM  Foot/eye exam completion dates  Eye Exam No Retinopathy  No Retinopathy      Foot Form Completion  Done      This result is from an external source.      Increase Trulicity  dose Updated lab needed at/ before next visit.   Encounter for annual physical exam Annual exam as documented. Counseling done  re healthy lifestyle involving commitment to 150 minutes exercise per week, heart healthy diet, and attaining healthy weight.The importance of adequate sleep also discussed. Regular seat  belt use and home safety, is also discussed. Changes in health habits are decided on by the patient with goals and time frames  set for achieving them. Immunization and cancer screening needs are specifically addressed at this visit.   Hyperlipidemia LDL goal <70 Hyperlipidemia:Low fat diet discussed and encouraged.   Lipid Panel  Lab Results  Component Value Date   CHOL 127 08/07/2023   HDL 40 08/07/2023   LDLCALC 65 08/07/2023   TRIG 122 08/07/2023   CHOLHDL 3.2 08/07/2023     Controlled, no change in medication Updated lab needed at/ before next visit.  "

## 2024-04-25 NOTE — Patient Instructions (Addendum)
 F/U in 13 to 15 weeks  Nurse pls add lipid panel and send urine ACR today  Non fasting hBA1C, bmp 3 to 5 days before next visit  Your cardiology f/u is due in May, call for appt please  if you do not get a call from that office Increase Trulicity  dose to 3 mg weekly in feb, work on food choice and smaller portions please  It is important that you exercise regularly at least 30 minutes 5 times a week. If you develop chest pain, have severe difficulty breathing, or feel very tired, stop exercising immediately and seek medical attention    LAZY is not an OPTION, the great grands and grands are watching you!   Nurse pls send rx for accuceck for twice daily testing    Muscle relaxant for back spasm is prescribed , take it for one at bedtimne for the next 4 to 7 nights then only if needed  PLEASE start back strengthening and generalized exercises for every joint for arthritis, stretches, alternate icing with heat whichever feels , topical rubs or / and massages  You are referred to Physical therapy for back pain  Thanks for choosing [facility], we consider it a privelige to serve you.

## 2024-04-25 NOTE — Assessment & Plan Note (Signed)
 Diabetes associated with hypertension, hyperlipidemia, obesity, and arthritis  [name] is reminded of the importance of commitment to daily physical activity for 30 minutes or more, as able and the need to limit carbohydrate intake to 30 to 60 grams per meal to help with blood sugar control.   The need to take medication as prescribed, test blood sugar as directed, and to call between visits if there is a concern that blood sugar is uncontrolled is also discussed.   [name] is reminded of the importance of daily foot exam, annual eye examination, and good blood sugar, blood pressure and cholesterol control.     Latest Ref Rng & Units 04/22/2024    8:51 AM 12/04/2023    8:01 AM 08/07/2023    8:10 AM 03/08/2023    9:40 AM 03/06/2023    8:30 AM  Diabetic Labs  HbA1c 4.8 - 5.6 % 7.2  7.0  7.2   7.2   Micro/Creat Ratio 0 - 29 mg/g creat    29    Chol 100 - 199 mg/dL   872     HDL >60 mg/dL   40     Calc LDL 0 - 99 mg/dL   65     Triglycerides 0 - 149 mg/dL   877     Creatinine 9.23 - 1.27 mg/dL 8.73   8.73   8.72       04/25/2024    2:53 PM 04/08/2024   10:41 AM 02/20/2024   11:01 AM 02/04/2024    4:47 PM 02/04/2024    3:59 PM 12/11/2023   12:29 PM 12/07/2023    3:01 PM  BP/Weight  Systolic BP 117 125 125 140 146 119 134  Diastolic BP 69 73 64 75 74 73 66  Wt. (Lbs) 214.04  204  204 210 212.04  BMI 31.61 kg/m2  30.13 kg/m2  30.13 kg/m2 31.01 kg/m2 31.31 kg/m2      Latest Ref Rng & Units 01/24/2023    8:30 AM 06/22/2022   12:00 AM  Foot/eye exam completion dates  Eye Exam No Retinopathy  No Retinopathy      Foot Form Completion  Done      This result is from an external source.      Increase Trulicity  dose Updated lab needed at/ before next visit.

## 2024-04-25 NOTE — Assessment & Plan Note (Signed)
 Hyperlipidemia:Low fat diet discussed and encouraged.   Lipid Panel  Lab Results  Component Value Date   CHOL 127 08/07/2023   HDL 40 08/07/2023   LDLCALC 65 08/07/2023   TRIG 122 08/07/2023   CHOLHDL 3.2 08/07/2023     Controlled, no change in medication Updated lab needed at/ before next visit.

## 2024-04-26 ENCOUNTER — Other Ambulatory Visit (HOSPITAL_COMMUNITY): Payer: Self-pay

## 2024-04-27 ENCOUNTER — Ambulatory Visit: Payer: Self-pay | Admitting: Family Medicine

## 2024-04-27 LAB — LIPID PANEL
Chol/HDL Ratio: 3 ratio (ref 0.0–5.0)
Cholesterol, Total: 121 mg/dL (ref 100–199)
HDL: 40 mg/dL
LDL Chol Calc (NIH): 62 mg/dL (ref 0–99)
Triglycerides: 101 mg/dL (ref 0–149)
VLDL Cholesterol Cal: 19 mg/dL (ref 5–40)

## 2024-04-27 LAB — MICROALBUMIN / CREATININE URINE RATIO
Creatinine, Urine: 176.2 mg/dL
Microalb/Creat Ratio: 16 mg/g{creat} (ref 0–29)
Microalbumin, Urine: 28.4 ug/mL

## 2024-04-27 LAB — SPECIMEN STATUS REPORT

## 2024-05-01 ENCOUNTER — Encounter (HOSPITAL_COMMUNITY): Payer: Self-pay

## 2024-05-01 ENCOUNTER — Ambulatory Visit (HOSPITAL_COMMUNITY): Attending: Family Medicine

## 2024-05-01 DIAGNOSIS — M5459 Other low back pain: Secondary | ICD-10-CM | POA: Insufficient documentation

## 2024-05-01 DIAGNOSIS — M5410 Radiculopathy, site unspecified: Secondary | ICD-10-CM | POA: Insufficient documentation

## 2024-05-01 NOTE — Therapy (Addendum)
 " OUTPATIENT PHYSICAL THERAPY THORACOLUMBAR EVALUATION   Patient Name: Robert [name] MRN: [id number] DOB:05-11-1950, 74 y.o., male Today's Date: 05/01/2024  END OF SESSION:  PT End of Session - 05/01/24 1209     Visit Number 1    Number of Visits 8    Date for Recertification  06/27/24    Authorization Type [employer]    Authorization Time Period no authorization required    PT Start Time 1030    PT Stop Time 1107    PT Time Calculation (min) 37 min    Activity Tolerance Patient tolerated treatment well    Behavior During Therapy [facility] for tasks assessed/performed          Past Medical History:  Diagnosis Date   Allergy    Arteriosclerotic cardiovascular disease (ASCVD)    DES to LAD D1 - 2003 in [place] ; failed intervention for a totally obstructed RCA at that time; EF of 45%; 12/2009: Equivocal stress nuclear with good exercise tolerance, negative stress EKG, normal EF with mild mid and distal inferior ischemia   Arthritis    Coronary artery disease    Diabetes mellitus, insulin  dependent (IDDM), controlled 10/25/2012   HBA1C is 6.9 on 10/22/2012, [employer] 04/26/2020   Diabetes mellitus, type II (HCC)    GERD (gastroesophageal reflux disease)    Hyperlipidemia    Lipid profile in 09/2011:128, 130, 33, 69   Hypertension    Lab  09/2011: Normal CMet ex G-133   Pneumonia    yrs ago   Prostate cancer (HCC)    Rash    right legt below knee using neosporin 2 to 3 x per day, area healing   Sleep apnea    no CPAP, can't tolerate   Tobacco abuse, in remission    30 pack years; quit in 1985   Wears glasses    Past Surgical History:  Procedure Laterality Date   BIOPSY  01/05/2022   Procedure: BIOPSY;  Surgeon: Eartha Angelia Sieving, MD;  [facility];  Service: Gastroenterology;;   BIOPSY  02/13/2023   Procedure: BIOPSY;  Surgeon: Eartha Angelia, Sieving, MD;  [facility]: [facility];  Service: Gastroenterology;;   bone spur Left    foot big toe    CARDIAC CATHETERIZATION  2003   DES placed,  done in [place]    COLONOSCOPY  04/03/2008   Negative screening study   COLONOSCOPY N/A 11/11/2014   Procedure: COLONOSCOPY;  Surgeon: Claudis RAYMOND Rivet, MD;  [facility]: [facility];  Service: Endoscopy;  Laterality: N/A;  1030   COLONOSCOPY WITH PROPOFOL  N/A 01/05/2022   Procedure: COLONOSCOPY WITH PROPOFOL ;  Surgeon: Eartha Angelia Sieving, MD;  [facility];  Service: Gastroenterology;  Laterality: N/A;  730 ASA 2   EPIDIDYMIS SURGERY  04/03/1994   ESOPHAGOGASTRODUODENOSCOPY (EGD) WITH PROPOFOL  N/A 01/05/2022   Procedure: ESOPHAGOGASTRODUODENOSCOPY (EGD) WITH PROPOFOL ;  Surgeon: Eartha Angelia Sieving, MD;  [facility];  Service: Gastroenterology;  Laterality: N/A;   ESOPHAGOGASTRODUODENOSCOPY (EGD) WITH PROPOFOL  N/A 02/13/2023   Procedure: ESOPHAGOGASTRODUODENOSCOPY (EGD) WITH PROPOFOL ;  Surgeon: Eartha Angelia Sieving, MD;  [facility];  Service: Gastroenterology;  Laterality: N/A;  9:00AM;ASA 1   HERNIA REPAIR     umbilical as a baby   KNEE ARTHROSCOPY W/ MENISCECTOMY  11/01/2008   Right   POLYPECTOMY  01/05/2022   Procedure: POLYPECTOMY;  Surgeon: Eartha Angelia Sieving, MD;  [facility]: [facility];  Service: Gastroenterology;;   PRESSURE ULCER DEBRIDEMENT  04/03/2004   Right lower  extremity   RADIOACTIVE SEED IMPLANT N/A 12/21/2022   Procedure: RADIOACTIVE SEED IMPLANT/BRACHYTHERAPY IMPLANT;  Surgeon: Matilda Senior, MD;  Location: [facility];  Service: Urology;  Laterality: N/A;   ROTATOR CUFF REPAIR  07/02/2009   Right   SPACE OAR INSTILLATION N/A 12/21/2022   Procedure: SPACE OAR INSTILLATION;  Surgeon: Matilda Senior, MD;  Location: [facility];  Service: Urology;  Laterality: N/A;   TOTAL KNEE ARTHROPLASTY Right 04/24/2022   Procedure: TOTAL KNEE ARTHROPLASTY;  Surgeon: Melodi Lerner, MD;  Location: [facility];  Service: Orthopedics;  Laterality:  Right;   Patient Active Problem List   Diagnosis Date Noted   Back pain with right-sided radiculopathy 04/25/2024   Immunization due 12/10/2023   Vitamin D  deficiency 12/10/2023   Hemangioma of skin 09/06/2023   Seborrheic keratosis 09/06/2023   Fleshy skin mole 08/09/2023   Xanthoma 02/13/2023   Gastric polyp 01/11/2023   Prostate cancer (HCC) 07/24/2022   Stiffness of right knee 04/26/2022   Primary osteoarthritis of right knee 04/24/2022   Encounter for annual physical exam 02/19/2022   Osteoarthritis of right knee 12/28/2021   OA (osteoarthritis) of knee 11/15/2021   Light headedness 02/12/2021   Asteatotic eczema 12/16/2020   Rash 05/04/2020   Knee pain, right 11/05/2018   Type 2 diabetes mellitus with vascular disease (HCC) 03/05/2018   Dysphagia 03/05/2018   Sleep apnea in adult 04/16/2017   Hypersomnia with sleep apnea 01/16/2016   Seasonal allergies 01/04/2015   Tubular adenoma of colon 11/13/2014   ASCVD (arteriosclerotic cardiovascular disease) 08/21/2014   Bilateral hand pain 04/06/2014   Back pain 09/16/2013   Essential hypertension    Hyperlipidemia LDL goal <70    Overweight (BMI 25.0-29.9) 12/24/2011   Cardiovascular disease 09/27/2009   TOBACCO ABUSE, HX OF 09/27/2009    PCP: Antonetta Rollene BRAVO, MD   REFERRING PROVIDER: Antonetta Rollene BRAVO, MD   REFERRING DIAG:  Back pain with right-sided radiculopathy   Rationale for Evaluation and Treatment: Rehabilitation  THERAPY DIAG:  Other low back pain  ONSET DATE: 4-5 years  SUBJECTIVE:                                                                                                                                                                                           SUBJECTIVE STATEMENT: 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 He has been prescribed muscle relaxant but has not started taking it. He reports that his back is very stiff and the stiffness is what causes  pain. He reports that the pain gets worse after physical activity like mowing the lawn  or playing golf. His pain and stiffness in his low back has increased recently with no known cause. He takes tylonol in the morning to relive pain, it helps, but does not relieve pain completely.  PERTINENT HISTORY:  Hypertension, DM type II, Arthritis, Arteriosclerotic cardiovascular disease (ASCVD), and history of prostate cancer  PAIN:  Are you having pain? Yes: NPRS scale: 4/10; current, 7/10; worst 3/10 best Pain location: low back radiating over to the right Pain description: achy, stiff Aggravating factors: manual labor and physical activity (golf) Relieving factors: Tylenol , cream  PRECAUTIONS: None  RED FLAGS: None   WEIGHT BEARING RESTRICTIONS: No  FALLS:  Has patient fallen in last 6 months? No  LIVING ENVIRONMENT: Lives with: lives with their family and lives with their spouse Lives in: House/apartment Stairs: Yes: Internal: 2 flights steps; on right going up Has following equipment at home: None  OCCUPATION: Retired  PLOF: Independent  PATIENT GOALS: Reduce pain, playing golf, mowing the yard, play with grandchildren.  NEXT MD VISIT: 07/25/24  OBJECTIVE:  Note: Objective measures were completed at Evaluation unless otherwise noted.  PATIENT SURVEYS:  Modified Oswestry:  MODIFIED OSWESTRY DISABILITY SCALE  Date: 05/01/24 Score  Pain intensity 3 =  Pain medication provides me with moderate relief from pain.  2. Personal care (washing, dressing, etc.) 0 =  I can take care of myself normally without causing increased pain.  3. Lifting 1 = I can lift heavy weights, but it causes increased pain.  4. Walking 0 = Pain does not prevent me from walking any distance  5. Sitting 3 =  Pain prevents me from sitting more than  hour.  6. Standing 3 =  Pain prevents me from standing more than 1/2 hour.  7. Sleeping 2 =  Even when I take pain medication, I sleep less than 6 hours  8.  Social Life 0 = My social life is normal and does not increase my pain.  9. Traveling 1 =  I can travel anywhere, but it increases my pain.  10. Employment/ Homemaking 1 = My normal homemaking/job activities increase my pain, but I can still perform all that is required of me  Total 14/50   Interpretation of scores: Score Category Description  0-20% Minimal Disability The patient can cope with most living activities. Usually no treatment is indicated apart from advice on lifting, sitting and exercise  21-40% Moderate Disability The patient experiences more pain and difficulty with sitting, lifting and standing. Travel and social life are more difficult and they may be disabled from work. Personal care, sexual activity and sleeping are not grossly affected, and the patient can usually be managed by conservative means  41-60% Severe Disability Pain remains the main problem in this group, but activities of daily living are affected. These patients require a detailed investigation  61-80% Crippled Back pain impinges on all aspects of the patients life. Positive intervention is required  81-100% Bed-bound These patients are either bed-bound or exaggerating their symptoms  Bluford FORBES Zoe DELENA Karon DELENA, et al. Surgery versus conservative management of stable thoracolumbar fracture: the PRESTO feasibility RCT. [place] ([place]): [employer]; 2021 Nov. [facility] Technology Assessment, No. 25.62.) Appendix 3, Oswestry Disability Index category descriptors. Available from: Findjewelers.cz  Minimally Clinically Important Difference (MCID) = 12.8%  COGNITION: Overall cognitive status: Within functional limits for tasks assessed     SENSATION: Not tested  POSTURE: decreased lumbar lordosis  PALPATION: L3-4: hypomobile and painful at grade I-II and grade IV for  L3. Tight paraspinals bilaterally.  LUMBAR ROM:   AROM eval  Flexion 60  Extension 20  Right lateral  flexion Hand  to fibular head  Left lateral flexion Finger tips to lateral epicondyle  Right rotation 25% limited  Left rotation 25% limited   (Blank rows = not tested)  LOWER EXTREMITY ROM:   Gross motor screen WFL.  LOWER EXTREMITY MMT:    MMT Right eval Left eval  Hip flexion 4+/5 4+/5  Hip extension    Hip abduction 5/5 5/5  Hip adduction 5/5 5/5  Hip internal rotation    Hip external rotation    Knee flexion 5/5 5/5  Knee extension 5/5 5/5  Ankle dorsiflexion 5/5 5/5  Ankle plantarflexion    Ankle inversion    Ankle eversion     (Blank rows = not tested)   FUNCTIONAL TESTS:  2 minute walk test: 482 ft SLS: Right: 30 sec          Left: 10 sec  GAIT: Distance walked: 482 ft Assistive device utilized: None Level of assistance: Complete Independence Comments: No major deviations in gait or antalgia. Pt reported no pain with walking.  TREATMENT DATE:   05/01/24: Carlene, HEP, and pt education.                                                                                                                            PATIENT EDUCATION:  Education details: Exam findings, POC, and HEP. Person educated: Patient Education method: Explanation, Demonstration, Tactile cues, and Handouts Education comprehension: verbalized understanding and returned demonstration  HOME EXERCISE PROGRAM: Access Code: [email or web address]  URL: [email or web address]  Date: 05/01/2024  Prepared by: Lacinda Fass   Exercises  - Standing Lumbar Extension with Counter  - 1 x daily - 7 x weekly - 3 sets - 10 reps - 5 hold  - Standing Lumbar Spine Flexion Stretch Counter  - 1 x daily - 7 x weekly - 3 sets - 10 reps - 5 hold  - Standing March with Counter Support  - 1 x daily - 7 x weekly - 3 sets - 10 reps   ASSESSMENT:  CLINICAL IMPRESSION: Patient is a 74 y.o. male who was seen today for physical therapy evaluation and treatment for LBP with right radiculopathy. He presents with decreased  lumbar ROM and hypomobility in L3-4. Pt reported familiar pain vertebrae mobilization assessment of L4. No muscular pain or soreness reported, but increased tone palpated in paraspinal muscles bilaterally. Pt showed decreased balance when performing SLS on the left LE. He reported having difficulty when getting up form the floor after playing with his grandchildren, Floor to chair mobility was assessed and found to exhibit poor sequencing and significant UE reliance He was educated on exam findings, HEP to improve lumbar mobility, and plan of care. Pt will benefit from skilled physical therapy services improve lumbar mobility, balance, and reduce pain to decrease functional limitations.  OBJECTIVE IMPAIRMENTS: decreased activity tolerance, decreased  balance, decreased mobility, decreased ROM, hypomobility, impaired flexibility, postural dysfunction, and pain.   ACTIVITY LIMITATIONS: carrying, bending, sitting, standing, squatting, sleeping, and bed mobility  PARTICIPATION LIMITATIONS: cleaning, community activity, yard work, and church  PERSONAL FACTORS: 3+ comorbidities: Hypertension, DM type II, Arthritis, Arteriosclerotic cardiovascular disease (ASCVD), right TKA s/p two years are also affecting patient's functional outcome.   REHAB POTENTIAL: Good  CLINICAL DECISION MAKING: Evolving/moderate complexity  EVALUATION COMPLEXITY: Moderate   GOALS: Goals reviewed with patient? No  SHORT TERM GOALS: Target date: 05/22/2024  Pt will be independent in HEP performance at least 3-4x weekly. Baseline: Goal status: INITIAL  2.  Pt will report 50% improvement overall. Baseline:  Goal status: INITIAL  3.  Pt will demonstrate correct sequencing during for floor to chair transfer to decrease risk of reinjury. Baseline:  Goal status: INITIAL   LONG TERM GOALS: Target date: 06/12/2024  Pt will be independent in performance of advanced HEP 3-4 x weekly. Baseline:  Goal status: INITIAL  2.   Pt will report 70% improvement overall. Baseline:  Goal status: INITIAL  3.  Pt will improve SLS on left leg to 30 seconds to demonstrate improved balance. Baseline:  Goal status: INITIAL  4.  Patient will improve modified ODI score to 8/50 or less. Baseline: 14/50 Goal status: INITIAL  5.  Pt will improve lumbar rotation ROM bilaterally to 0% limited to be able return to playing golf. Baseline:  Goal status: INITIAL   PLAN:  PT FREQUENCY: 2x/week  PT DURATION: 4 weeks  PLANNED INTERVENTIONS: [id number]- PT Re-evaluation, 97750- Physical Performance Testing, 97110-Therapeutic exercises, 97530- Therapeutic activity, [id number]- Neuromuscular re-education, 97535- Self Care, [id number]- Manual therapy, [id number]- Electrical stimulation (unattended), Patient/Family education, Balance training, Joint mobilization, Spinal mobilization, Cryotherapy, and Moist heat.  PLAN FOR NEXT SESSION: Review and update HEP. Introduce correct mobility patterns when getting from floor to standing. Introduce lumbar mobility interventions working toward improved extension.    Venus Galeazzi, Student-PT 05/01/2024, 6:11 PM    Today's visit was completed with direct 1:1 supervision and direction by Lacinda Fass, PT, DPT   "

## 2024-05-04 ENCOUNTER — Other Ambulatory Visit (HOSPITAL_COMMUNITY): Payer: Self-pay

## 2024-05-05 ENCOUNTER — Telehealth (HOSPITAL_COMMUNITY): Payer: Self-pay

## 2024-05-05 ENCOUNTER — Other Ambulatory Visit (HOSPITAL_COMMUNITY): Payer: Self-pay

## 2024-05-05 ENCOUNTER — Other Ambulatory Visit: Payer: Self-pay

## 2024-05-05 NOTE — Telephone Encounter (Signed)
 PA request has been Received. New Encounter has been or will be created for follow up. For additional info see Pharmacy Prior Auth telephone encounter from 05/05/24.

## 2024-05-05 NOTE — Telephone Encounter (Signed)
 Pharmacy Patient Advocate Encounter  Received notification from [facility] [employer] that Prior Authorization for Trulicity  3MG /0.5ML auto-injectors  has been APPROVED from 05/05/24 to 05/05/25. Ran test claim, Copay is $0. This test claim was processed through [facility]- copay amounts may vary at other pharmacies due to pharmacy/plan contracts, or as the patient moves through the different stages of their insurance plan.   PA #/Case ID/Reference #: [id number]

## 2024-05-05 NOTE — Telephone Encounter (Signed)
 Pharmacy Patient Advocate Encounter   Received notification from Pt Calls Messages that prior authorization for Trulicity  3MG /0.5ML auto-injectors  is required/requested.   Insurance verification completed.   The patient is insured through [facility] [employer].   Per test claim: PA required; PA submitted to above mentioned insurance via Latent Key/confirmation #/EOC [id number] Status is pending

## 2024-05-06 ENCOUNTER — Ambulatory Visit (HOSPITAL_COMMUNITY): Admitting: Physical Therapy

## 2024-05-06 DIAGNOSIS — M5459 Other low back pain: Secondary | ICD-10-CM

## 2024-05-07 ENCOUNTER — Other Ambulatory Visit (HOSPITAL_COMMUNITY): Payer: Self-pay

## 2024-05-08 ENCOUNTER — Encounter (HOSPITAL_COMMUNITY): Payer: Self-pay

## 2024-05-08 ENCOUNTER — Ambulatory Visit (HOSPITAL_COMMUNITY): Admitting: Physical Therapy

## 2024-05-08 DIAGNOSIS — M5459 Other low back pain: Secondary | ICD-10-CM

## 2024-05-08 NOTE — Therapy (Addendum)
 " OUTPATIENT PHYSICAL THERAPY THORACOLUMBAR TREATMENT   Patient Name: SEVERIANO UTSEY MRN: [id number] DOB:1951/03/20, 74 y.o., male Today's Date: 05/08/2024  END OF SESSION:  PT End of Session - 05/08/24 0906     Visit Number 3    Number of Visits 8    Date for Recertification  06/27/24    Authorization Type [employer]    Authorization Time Period no authorization required    PT Start Time 0901    PT Stop Time 0943    PT Time Calculation (min) 42 min    Activity Tolerance Patient tolerated treatment well    Behavior During Therapy [facility] for tasks assessed/performed            Past Medical History:  Diagnosis Date   Allergy    Arteriosclerotic cardiovascular disease (ASCVD)    DES to LAD D1 - 2003 in [place] ; failed intervention for a totally obstructed RCA at that time; EF of 45%; 12/2009: Equivocal stress nuclear with good exercise tolerance, negative stress EKG, normal EF with mild mid and distal inferior ischemia   Arthritis    Coronary artery disease    Diabetes mellitus, insulin  dependent (IDDM), controlled 10/25/2012   HBA1C is 6.9 on 10/22/2012, [employer] 04/26/2020   Diabetes mellitus, type II (HCC)    GERD (gastroesophageal reflux disease)    Hyperlipidemia    Lipid profile in 09/2011:128, 130, 33, 69   Hypertension    Lab  09/2011: Normal CMet ex G-133   Pneumonia    yrs ago   Prostate cancer (HCC)    Rash    right legt below knee using neosporin 2 to 3 x per day, area healing   Sleep apnea    no CPAP, can't tolerate   Tobacco abuse, in remission    30 pack years; quit in 1985   Wears glasses    Past Surgical History:  Procedure Laterality Date   BIOPSY  01/05/2022   Procedure: BIOPSY;  Surgeon: Eartha Angelia Sieving, MD;  [facility];  Service: Gastroenterology;;   BIOPSY  02/13/2023   Procedure: BIOPSY;  Surgeon: Eartha Angelia, Sieving, MD;  [facility];  Service: Gastroenterology;;   bone spur Left    foot big  toe   CARDIAC CATHETERIZATION  2003   DES placed,  done in [place]    COLONOSCOPY  04/03/2008   Negative screening study   COLONOSCOPY N/A 11/11/2014   Procedure: COLONOSCOPY;  Surgeon: Claudis RAYMOND Rivet, MD;  [facility];  Service: Endoscopy;  Laterality: N/A;  1030   COLONOSCOPY WITH PROPOFOL  N/A 01/05/2022   Procedure: COLONOSCOPY WITH PROPOFOL ;  Surgeon: Eartha Angelia Sieving, MD;  [facility];  Service: Gastroenterology;  Laterality: N/A;  730 ASA 2   EPIDIDYMIS SURGERY  04/03/1994   ESOPHAGOGASTRODUODENOSCOPY (EGD) WITH PROPOFOL  N/A 01/05/2022   Procedure: ESOPHAGOGASTRODUODENOSCOPY (EGD) WITH PROPOFOL ;  Surgeon: Eartha Angelia Sieving, MD;  [facility];  Service: Gastroenterology;  Laterality: N/A;   ESOPHAGOGASTRODUODENOSCOPY (EGD) WITH PROPOFOL  N/A 02/13/2023   Procedure: ESOPHAGOGASTRODUODENOSCOPY (EGD) WITH PROPOFOL ;  Surgeon: Eartha Angelia Sieving, MD;  [facility];  Service: Gastroenterology;  Laterality: N/A;  9:00AM;ASA 1   HERNIA REPAIR     umbilical as a baby   KNEE ARTHROSCOPY W/ MENISCECTOMY  11/01/2008   Right   POLYPECTOMY  01/05/2022   Procedure: POLYPECTOMY;  Surgeon: Eartha Angelia Sieving, MD;  [facility];  Service: Gastroenterology;;   PRESSURE ULCER DEBRIDEMENT  04/03/2004  Right lower extremity   RADIOACTIVE SEED IMPLANT N/A 12/21/2022   Procedure: RADIOACTIVE SEED IMPLANT/BRACHYTHERAPY IMPLANT;  Surgeon: Matilda Senior, MD;  Location: [facility];  Service: Urology;  Laterality: N/A;   ROTATOR CUFF REPAIR  07/02/2009   Right   SPACE OAR INSTILLATION N/A 12/21/2022   Procedure: SPACE OAR INSTILLATION;  Surgeon: Matilda Senior, MD;  Location: [facility];  Service: Urology;  Laterality: N/A;   TOTAL KNEE ARTHROPLASTY Right 04/24/2022   Procedure: TOTAL KNEE ARTHROPLASTY;  Surgeon: Melodi Lerner, MD;  Location: [facility];  Service: Orthopedics;   Laterality: Right;   Patient Active Problem List   Diagnosis Date Noted   Back pain with right-sided radiculopathy 04/25/2024   Immunization due 12/10/2023   Vitamin D  deficiency 12/10/2023   Hemangioma of skin 09/06/2023   Seborrheic keratosis 09/06/2023   Fleshy skin mole 08/09/2023   Xanthoma 02/13/2023   Gastric polyp 01/11/2023   Prostate cancer (HCC) 07/24/2022   Stiffness of right knee 04/26/2022   Primary osteoarthritis of right knee 04/24/2022   Encounter for annual physical exam 02/19/2022   Osteoarthritis of right knee 12/28/2021   OA (osteoarthritis) of knee 11/15/2021   Light headedness 02/12/2021   Asteatotic eczema 12/16/2020   Rash 05/04/2020   Knee pain, right 11/05/2018   Type 2 diabetes mellitus with vascular disease (HCC) 03/05/2018   Dysphagia 03/05/2018   Sleep apnea in adult 04/16/2017   Hypersomnia with sleep apnea 01/16/2016   Seasonal allergies 01/04/2015   Tubular adenoma of colon 11/13/2014   ASCVD (arteriosclerotic cardiovascular disease) 08/21/2014   Bilateral hand pain 04/06/2014   Back pain 09/16/2013   Essential hypertension    Hyperlipidemia LDL goal <70    Overweight (BMI 25.0-29.9) 12/24/2011   Cardiovascular disease 09/27/2009   TOBACCO ABUSE, HX OF 09/27/2009    PCP: Antonetta Rollene BRAVO, MD   REFERRING PROVIDER: Antonetta Rollene BRAVO, MD   REFERRING DIAG:  Back pain with right-sided radiculopathy   Rationale for Evaluation and Treatment: Rehabilitation  THERAPY DIAG:  Other low back pain  ONSET DATE: 4-5 years  SUBJECTIVE:                                                                                                                                                                                           SUBJECTIVE STATEMENT: Pt reports being in 2/10 pain today with stiffness. He felt tired after the last session, but no soreness. He woke up this morning with no pain in his back. He reports doing his HEP at home and feeling  better because of it.  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 He has been prescribed muscle relaxant but has not started taking it. He reports that his back is very stiff and the stiffness is what causes pain. He reports that the pain gets worse after physical activity like mowing the lawn or playing golf. His pain and stiffness in his low back has increased recently with no known cause. He takes tylonol in the morning to relive pain, it helps, but does not relieve pain completely.  PERTINENT HISTORY:  Hypertension, DM type II, Arthritis, Arteriosclerotic cardiovascular disease (ASCVD), and history of prostate cancer  PAIN:  Are you having pain? Yes: NPRS scale: 4/10; current, 7/10; worst 3/10 best Pain location: low back radiating over to the right Pain description: achy, stiff Aggravating factors: manual labor and physical activity (golf) Relieving factors: Tylenol , cream  PRECAUTIONS: None  RED FLAGS: None   WEIGHT BEARING RESTRICTIONS: No  FALLS:  Has patient fallen in last 6 months? No  LIVING ENVIRONMENT: Lives with: lives with their family and lives with their spouse Lives in: House/apartment Stairs: Yes: Internal: 2 flights steps; on right going up Has following equipment at home: None  OCCUPATION: Retired  PLOF: Independent  PATIENT GOALS: Reduce pain, playing golf, mowing the yard, play with grandchildren.  NEXT MD VISIT: 07/25/24  OBJECTIVE:  Note: Objective measures were completed at Evaluation unless otherwise noted.  PATIENT SURVEYS:  Modified Oswestry:  MODIFIED OSWESTRY DISABILITY SCALE  Date: 05/01/24 Score  Pain intensity 3 =  Pain medication provides me with moderate relief from pain.  2. Personal care (washing, dressing, etc.) 0 =  I can take care of myself normally without causing increased pain.  3. Lifting 1 = I can lift heavy weights, but it causes increased pain.  4. Walking 0 = Pain does not prevent me from walking  any distance  5. Sitting 3 =  Pain prevents me from sitting more than  hour.  6. Standing 3 =  Pain prevents me from standing more than 1/2 hour.  7. Sleeping 2 =  Even when I take pain medication, I sleep less than 6 hours  8. Social Life 0 = My social life is normal and does not increase my pain.  9. Traveling 1 =  I can travel anywhere, but it increases my pain.  10. Employment/ Homemaking 1 = My normal homemaking/job activities increase my pain, but I can still perform all that is required of me  Total 14/50   Interpretation of scores: Score Category Description  0-20% Minimal Disability The patient can cope with most living activities. Usually no treatment is indicated apart from advice on lifting, sitting and exercise  21-40% Moderate Disability The patient experiences more pain and difficulty with sitting, lifting and standing. Travel and social life are more difficult and they may be disabled from work. Personal care, sexual activity and sleeping are not grossly affected, and the patient can usually be managed by conservative means  41-60% Severe Disability Pain remains the main problem in this group, but activities of daily living are affected. These patients require a detailed investigation  61-80% Crippled Back pain impinges on all aspects of the patients life. Positive intervention is required  81-100% Bed-bound These patients are either bed-bound or exaggerating their symptoms  Bluford FORBES Zoe DELENA Karon DELENA, et al. Surgery versus conservative management of stable thoracolumbar fracture: the PRESTO feasibility RCT. [place] ([place]): [employer]; 2021 Nov. [facility] Technology Assessment, No. 25.62.) Appendix 3, Oswestry Disability Index category descriptors. Available  from: Findjewelers.cz  Minimally Clinically Important Difference (MCID) = 12.8%  COGNITION: Overall cognitive status: Within functional limits for tasks  assessed     SENSATION: Not tested  POSTURE: decreased lumbar lordosis  PALPATION: L3-4: hypomobile and painful at grade I-II and grade IV for L3. Tight paraspinals bilaterally.  LUMBAR ROM:   AROM eval  Flexion 60  Extension 20  Right lateral flexion Hand  to fibular head  Left lateral flexion Finger tips to lateral epicondyle  Right rotation 25% limited  Left rotation 25% limited   (Blank rows = not tested)  LOWER EXTREMITY ROM:   Gross motor screen WFL.  LOWER EXTREMITY MMT:    MMT Right eval Left eval  Hip flexion 4+/5 4+/5  Hip extension    Hip abduction 5/5 5/5  Hip adduction 5/5 5/5  Hip internal rotation    Hip external rotation    Knee flexion 5/5 5/5  Knee extension 5/5 5/5  Ankle dorsiflexion 5/5 5/5  Ankle plantarflexion    Ankle inversion    Ankle eversion     (Blank rows = not tested)   FUNCTIONAL TESTS:  2 minute walk test: 482 ft SLS: Right: 30 sec          Left: 10 sec  GAIT: Distance walked: 482 ft Assistive device utilized: None Level of assistance: Complete Independence Comments: No major deviations in gait or antalgia. Pt reported no pain with walking.  TREATMENT DATE:                                 05/08/24    EXERCISE LOG  Exercise Repetitions and Resistance Comments  NuStep L4 x 5 minutes Seat 9, UE 7  Lunges  15 reps x 2   Mountain climbers 1 minute x 2   Floor to standing transfers  Pt education on sequencing. 7 reps floor to standing with UE support 5 reps floor to standing without UE support. After demonstratin and verbal cueing, pt was bale to complete successfully.  Prone press up with joint mobilization 4 minutes. Grade III mobilizations L2-L5.   Tandem stance on foam 2 x 1 minute each side No UE support needed.  Standing marches 15 reps each side x 4# weights Verbally cued for core activation.  Walking with 4# weights 2 laps Pt requested to walk 2 laps with weights.  Golf swing with weighted bar 3 minutes x 3# bar  Cued for using hips and knees rather than trunk rotation.   Blank cell = exercise not performed today   05/06/2024 Standing:  heelraises 20X  High knee march, cues to complete slower 10X each, 2 sets  Lumbar extension over bar 10X  12 hamstring stretch 2X30 each  Slant board stretch 3X30 Seated:  piriformis stretch 2X30 each  Sit to stands no UE 10X Standing GTB retractions 15X  Shoulder extensions GTB 15X  Pallof GTB 10X each direction   05/01/24: Eval, HEP, and pt education.  PATIENT EDUCATION:  Education details: Exam findings, POC, and HEP. Person educated: Patient Education method: Explanation, Demonstration, Tactile cues, and Handouts Education comprehension: verbalized understanding and returned demonstration  HOME EXERCISE PROGRAM: Access Code: [email or web address]  URL: [email or web address]  Date: 05/01/2024  Prepared by: Lacinda Fass  Exercises  - Standing Lumbar Extension with Counter  - 1 x daily - 7 x weekly - 3 sets - 10 reps - 5 hold  - Standing Lumbar Spine Flexion Stretch Counter  - 1 x daily - 7 x weekly - 3 sets - 10 reps - 5 hold  - Standing March with Counter Support  - 1 x daily - 7 x weekly - 3 sets - 10 reps   Access Code: [email or web address] URL: [email or web address] Date: 05/06/2024 Prepared by: Greig Fuse Exercises - Seated Piriformis Stretch with Trunk Bend  - 2 x daily - 7 x weekly - 3 reps - 30 sec hold - Sit to Stand Without Arm Support  - 2 x daily - 7 x weekly - 3 sets - 10 reps - Standing Hamstring Stretch on Chair  - 2 x daily - 7 x weekly - 3 reps - 30 sec hold  ASSESSMENT:  CLINICAL IMPRESSION: Pt was educated on sequencing during floor to standing transfers with and without UE support. After demonstrating and verbal cues, pt was able to perform transfers successfully with and without UE support. He  completed balance interventions with moderate cueing for core activation. He reported feeling fatigued after a couple of holes on the golf course, so golf swing was performed and assessed with a weighted bar. He was cued for LE involvement rather than isolated trunk rotation. He reported no increase in pain or soreness at the end of treatment today. Pt will continue to benefit from skilled physical therapy to increase lumbar mobility, postural control and balance to decrease functional limitations.  OBJECTIVE IMPAIRMENTS: decreased activity tolerance, decreased balance, decreased mobility, decreased ROM, hypomobility, impaired flexibility, postural dysfunction, and pain.   ACTIVITY LIMITATIONS: carrying, bending, sitting, standing, squatting, sleeping, and bed mobility  PARTICIPATION LIMITATIONS: cleaning, community activity, yard work, and church  PERSONAL FACTORS: 3+ comorbidities: Hypertension, DM type II, Arthritis, Arteriosclerotic cardiovascular disease (ASCVD), right TKA s/p two years are also affecting patient's functional outcome.   REHAB POTENTIAL: Good  CLINICAL DECISION MAKING: Evolving/moderate complexity  EVALUATION COMPLEXITY: Moderate   GOALS: Goals reviewed with patient? No  SHORT TERM GOALS: Target date: 05/22/2024  Pt will be independent in HEP performance at least 3-4x weekly. Baseline: Goal status: INITIAL  2.  Pt will report 50% improvement overall. Baseline:  Goal status: INITIAL  3.  Pt will demonstrate correct sequencing during for floor to chair transfer to decrease risk of reinjury. Baseline:  Goal status: INITIAL   LONG TERM GOALS: Target date: 06/12/2024  Pt will be independent in performance of advanced HEP 3-4 x weekly. Baseline:  Goal status: INITIAL  2.  Pt will report 70% improvement overall. Baseline:  Goal status: INITIAL  3.  Pt will improve SLS on left leg to 30 seconds to demonstrate improved balance. Baseline:  Goal status:  INITIAL  4.  Patient will improve modified ODI score to 8/50 or less. Baseline: 14/50 Goal status: INITIAL  5.  Pt will improve lumbar rotation ROM bilaterally to 0% limited to be able return to playing golf. Baseline:  Goal status: INITIAL   PLAN:  PT FREQUENCY: 2x/week  PT DURATION: 4 weeks  PLANNED INTERVENTIONS: [id number]- PT Re-evaluation, 97750- Physical Performance Testing, 97110-Therapeutic  exercises, 97530- Therapeutic activity, [id number]- Neuromuscular re-education, [id number]- Self Care, [id number]- Manual therapy, [id number]- Electrical stimulation (unattended), Patient/Family education, Balance training, Joint mobilization, Spinal mobilization, Cryotherapy, and Moist heat.  PLAN FOR NEXT SESSION: Continue to progress lumbar mobility interventions focusing on extension. Work on biomechanics and endurance in golf swing so that patient can return to playing golf.  Venus Galeazzi, Student-PT 05/08/2024, 4:54 PM   Today's visit was completed with direct 1:1 supervision and direction by Lacinda Fass, PT, DPT   "

## 2024-05-14 ENCOUNTER — Ambulatory Visit (HOSPITAL_COMMUNITY)

## 2024-05-16 ENCOUNTER — Ambulatory Visit (HOSPITAL_COMMUNITY)

## 2024-05-20 ENCOUNTER — Ambulatory Visit (HOSPITAL_COMMUNITY)

## 2024-05-22 ENCOUNTER — Ambulatory Visit (HOSPITAL_COMMUNITY)

## 2024-05-27 ENCOUNTER — Ambulatory Visit (HOSPITAL_COMMUNITY)

## 2024-05-29 ENCOUNTER — Ambulatory Visit (HOSPITAL_COMMUNITY)

## 2024-06-24 ENCOUNTER — Ambulatory Visit: Admitting: Podiatry

## 2024-07-25 ENCOUNTER — Ambulatory Visit: Payer: Self-pay | Admitting: Family Medicine

## 2024-10-20 ENCOUNTER — Ambulatory Visit: Admitting: Urology

## 2025-02-24 ENCOUNTER — Ambulatory Visit
